# Patient Record
Sex: Female | Born: 1970 | Race: Black or African American | Hispanic: No | Smoking: Former smoker
Health system: Southern US, Community
[De-identification: ages and names within clinical notes are randomized; demographics above are authoritative.]

## PROBLEM LIST (undated history)

## (undated) DIAGNOSIS — I10 Essential (primary) hypertension: Secondary | ICD-10-CM

## (undated) DIAGNOSIS — F419 Anxiety disorder, unspecified: Secondary | ICD-10-CM

## (undated) DIAGNOSIS — F32A Depression, unspecified: Secondary | ICD-10-CM

## (undated) DIAGNOSIS — T7840XA Allergy, unspecified, initial encounter: Secondary | ICD-10-CM

## (undated) DIAGNOSIS — I639 Cerebral infarction, unspecified: Secondary | ICD-10-CM

## (undated) DIAGNOSIS — T8859XA Other complications of anesthesia, initial encounter: Secondary | ICD-10-CM

## (undated) DIAGNOSIS — L309 Dermatitis, unspecified: Secondary | ICD-10-CM

## (undated) DIAGNOSIS — J45909 Unspecified asthma, uncomplicated: Secondary | ICD-10-CM

## (undated) DIAGNOSIS — T4145XA Adverse effect of unspecified anesthetic, initial encounter: Secondary | ICD-10-CM

## (undated) DIAGNOSIS — M419 Scoliosis, unspecified: Secondary | ICD-10-CM

## (undated) DIAGNOSIS — G8929 Other chronic pain: Secondary | ICD-10-CM

## (undated) DIAGNOSIS — F329 Major depressive disorder, single episode, unspecified: Secondary | ICD-10-CM

## (undated) DIAGNOSIS — L0390 Cellulitis, unspecified: Secondary | ICD-10-CM

## (undated) DIAGNOSIS — D649 Anemia, unspecified: Secondary | ICD-10-CM

## (undated) DIAGNOSIS — M549 Dorsalgia, unspecified: Secondary | ICD-10-CM

## (undated) HISTORY — PX: BREAST SURGERY: SHX581

## (undated) HISTORY — DX: Allergy, unspecified, initial encounter: T78.40XA

## (undated) HISTORY — PX: TUBAL LIGATION: SHX77

## (undated) HISTORY — PX: ABDOMINAL HYSTERECTOMY: SHX81

---

## 1997-08-19 ENCOUNTER — Encounter: Admission: RE | Admit: 1997-08-19 | Discharge: 1997-08-19 | Payer: Self-pay | Admitting: Sports Medicine

## 1997-08-26 ENCOUNTER — Encounter: Admission: RE | Admit: 1997-08-26 | Discharge: 1997-08-26 | Payer: Self-pay | Admitting: Family Medicine

## 1997-09-03 ENCOUNTER — Encounter: Admission: RE | Admit: 1997-09-03 | Discharge: 1997-09-03 | Payer: Self-pay | Admitting: Family Medicine

## 1997-09-10 ENCOUNTER — Encounter: Admission: RE | Admit: 1997-09-10 | Discharge: 1997-12-09 | Payer: Self-pay | Admitting: *Deleted

## 1997-09-10 ENCOUNTER — Encounter: Admission: RE | Admit: 1997-09-10 | Discharge: 1997-09-10 | Payer: Self-pay | Admitting: Family Medicine

## 1997-11-07 ENCOUNTER — Emergency Department (HOSPITAL_COMMUNITY): Admission: EM | Admit: 1997-11-07 | Discharge: 1997-11-07 | Payer: Self-pay | Admitting: Emergency Medicine

## 1998-03-14 ENCOUNTER — Emergency Department (HOSPITAL_COMMUNITY): Admission: EM | Admit: 1998-03-14 | Discharge: 1998-03-14 | Payer: Self-pay | Admitting: Emergency Medicine

## 1998-03-21 ENCOUNTER — Emergency Department (HOSPITAL_COMMUNITY): Admission: EM | Admit: 1998-03-21 | Discharge: 1998-03-21 | Payer: Self-pay | Admitting: Emergency Medicine

## 1998-04-06 ENCOUNTER — Encounter: Payer: Self-pay | Admitting: Emergency Medicine

## 1998-04-06 ENCOUNTER — Ambulatory Visit (HOSPITAL_COMMUNITY): Admission: RE | Admit: 1998-04-06 | Discharge: 1998-04-06 | Payer: Self-pay | Admitting: Obstetrics and Gynecology

## 1998-04-06 ENCOUNTER — Emergency Department (HOSPITAL_COMMUNITY): Admission: EM | Admit: 1998-04-06 | Discharge: 1998-04-06 | Payer: Self-pay | Admitting: Emergency Medicine

## 1998-04-09 ENCOUNTER — Ambulatory Visit (HOSPITAL_COMMUNITY): Admission: RE | Admit: 1998-04-09 | Discharge: 1998-04-09 | Payer: Self-pay | Admitting: Obstetrics

## 1998-04-12 ENCOUNTER — Inpatient Hospital Stay (HOSPITAL_COMMUNITY): Admission: AD | Admit: 1998-04-12 | Discharge: 1998-04-12 | Payer: Self-pay | Admitting: Obstetrics and Gynecology

## 1998-04-16 ENCOUNTER — Ambulatory Visit (HOSPITAL_COMMUNITY): Admission: RE | Admit: 1998-04-16 | Discharge: 1998-04-16 | Payer: Self-pay | Admitting: *Deleted

## 1998-04-29 ENCOUNTER — Ambulatory Visit (HOSPITAL_COMMUNITY): Admission: RE | Admit: 1998-04-29 | Discharge: 1998-04-29 | Payer: Self-pay | Admitting: *Deleted

## 1998-05-14 ENCOUNTER — Emergency Department (HOSPITAL_COMMUNITY): Admission: EM | Admit: 1998-05-14 | Discharge: 1998-05-14 | Payer: Self-pay | Admitting: Emergency Medicine

## 1998-05-27 ENCOUNTER — Emergency Department (HOSPITAL_COMMUNITY): Admission: EM | Admit: 1998-05-27 | Discharge: 1998-05-27 | Payer: Self-pay | Admitting: Emergency Medicine

## 1998-08-02 ENCOUNTER — Other Ambulatory Visit: Admission: RE | Admit: 1998-08-02 | Discharge: 1998-08-02 | Payer: Self-pay | Admitting: Obstetrics and Gynecology

## 1998-12-04 ENCOUNTER — Inpatient Hospital Stay (HOSPITAL_COMMUNITY): Admission: AD | Admit: 1998-12-04 | Discharge: 1998-12-04 | Payer: Self-pay | Admitting: Obstetrics and Gynecology

## 1999-01-04 ENCOUNTER — Inpatient Hospital Stay (HOSPITAL_COMMUNITY): Admission: AD | Admit: 1999-01-04 | Discharge: 1999-01-04 | Payer: Self-pay | Admitting: Obstetrics and Gynecology

## 1999-01-09 ENCOUNTER — Inpatient Hospital Stay (HOSPITAL_COMMUNITY): Admission: AD | Admit: 1999-01-09 | Discharge: 1999-01-09 | Payer: Self-pay | Admitting: Obstetrics and Gynecology

## 1999-01-19 ENCOUNTER — Inpatient Hospital Stay (HOSPITAL_COMMUNITY): Admission: AD | Admit: 1999-01-19 | Discharge: 1999-01-19 | Payer: Self-pay | Admitting: Obstetrics & Gynecology

## 1999-02-20 ENCOUNTER — Inpatient Hospital Stay (HOSPITAL_COMMUNITY): Admission: AD | Admit: 1999-02-20 | Discharge: 1999-02-20 | Payer: Self-pay | Admitting: *Deleted

## 1999-02-28 ENCOUNTER — Encounter (INDEPENDENT_AMBULATORY_CARE_PROVIDER_SITE_OTHER): Payer: Self-pay

## 1999-02-28 ENCOUNTER — Inpatient Hospital Stay (HOSPITAL_COMMUNITY): Admission: AD | Admit: 1999-02-28 | Discharge: 1999-03-02 | Payer: Self-pay | Admitting: Obstetrics and Gynecology

## 2000-03-23 ENCOUNTER — Emergency Department (HOSPITAL_COMMUNITY): Admission: EM | Admit: 2000-03-23 | Discharge: 2000-03-23 | Payer: Self-pay | Admitting: Emergency Medicine

## 2000-11-08 ENCOUNTER — Encounter: Payer: Self-pay | Admitting: Emergency Medicine

## 2000-11-08 ENCOUNTER — Emergency Department (HOSPITAL_COMMUNITY): Admission: EM | Admit: 2000-11-08 | Discharge: 2000-11-08 | Payer: Self-pay | Admitting: Emergency Medicine

## 2001-02-12 ENCOUNTER — Emergency Department (HOSPITAL_COMMUNITY): Admission: EM | Admit: 2001-02-12 | Discharge: 2001-02-12 | Payer: Self-pay | Admitting: Emergency Medicine

## 2001-06-09 ENCOUNTER — Encounter: Admission: RE | Admit: 2001-06-09 | Discharge: 2001-06-09 | Payer: Self-pay | Admitting: Family Medicine

## 2001-06-20 ENCOUNTER — Encounter: Admission: RE | Admit: 2001-06-20 | Discharge: 2001-06-20 | Payer: Self-pay | Admitting: Family Medicine

## 2001-07-31 ENCOUNTER — Encounter: Admission: RE | Admit: 2001-07-31 | Discharge: 2001-07-31 | Payer: Self-pay | Admitting: Sports Medicine

## 2001-12-02 ENCOUNTER — Encounter: Admission: RE | Admit: 2001-12-02 | Discharge: 2001-12-02 | Payer: Self-pay | Admitting: Family Medicine

## 2002-11-15 ENCOUNTER — Encounter (INDEPENDENT_AMBULATORY_CARE_PROVIDER_SITE_OTHER): Payer: Self-pay | Admitting: *Deleted

## 2002-11-15 LAB — CONVERTED CEMR LAB

## 2002-11-19 ENCOUNTER — Other Ambulatory Visit: Admission: RE | Admit: 2002-11-19 | Discharge: 2002-11-19 | Payer: Self-pay | Admitting: Family Medicine

## 2002-11-20 ENCOUNTER — Encounter: Payer: Self-pay | Admitting: Sports Medicine

## 2002-11-20 ENCOUNTER — Encounter (INDEPENDENT_AMBULATORY_CARE_PROVIDER_SITE_OTHER): Payer: Self-pay | Admitting: *Deleted

## 2002-11-20 ENCOUNTER — Encounter: Admission: RE | Admit: 2002-11-20 | Discharge: 2002-11-20 | Payer: Self-pay | Admitting: Sports Medicine

## 2003-02-23 ENCOUNTER — Emergency Department (HOSPITAL_COMMUNITY): Admission: EM | Admit: 2003-02-23 | Discharge: 2003-02-23 | Payer: Self-pay | Admitting: *Deleted

## 2003-06-26 ENCOUNTER — Emergency Department (HOSPITAL_COMMUNITY): Admission: EM | Admit: 2003-06-26 | Discharge: 2003-06-26 | Payer: Self-pay | Admitting: Emergency Medicine

## 2003-06-26 ENCOUNTER — Emergency Department (HOSPITAL_COMMUNITY): Admission: AD | Admit: 2003-06-26 | Discharge: 2003-06-26 | Payer: Self-pay | Admitting: Emergency Medicine

## 2003-07-06 ENCOUNTER — Emergency Department (HOSPITAL_COMMUNITY): Admission: EM | Admit: 2003-07-06 | Discharge: 2003-07-06 | Payer: Self-pay | Admitting: Emergency Medicine

## 2003-08-13 ENCOUNTER — Emergency Department (HOSPITAL_COMMUNITY): Admission: EM | Admit: 2003-08-13 | Discharge: 2003-08-13 | Payer: Self-pay | Admitting: Emergency Medicine

## 2004-03-03 ENCOUNTER — Emergency Department (HOSPITAL_COMMUNITY): Admission: EM | Admit: 2004-03-03 | Discharge: 2004-03-03 | Payer: Self-pay | Admitting: Emergency Medicine

## 2004-10-18 ENCOUNTER — Emergency Department (HOSPITAL_COMMUNITY): Admission: EM | Admit: 2004-10-18 | Discharge: 2004-10-18 | Payer: Self-pay | Admitting: Emergency Medicine

## 2004-12-19 ENCOUNTER — Emergency Department (HOSPITAL_COMMUNITY): Admission: EM | Admit: 2004-12-19 | Discharge: 2004-12-19 | Payer: Self-pay | Admitting: Emergency Medicine

## 2005-02-12 ENCOUNTER — Emergency Department (HOSPITAL_COMMUNITY): Admission: EM | Admit: 2005-02-12 | Discharge: 2005-02-12 | Payer: Self-pay | Admitting: Emergency Medicine

## 2005-04-06 ENCOUNTER — Emergency Department (HOSPITAL_COMMUNITY): Admission: EM | Admit: 2005-04-06 | Discharge: 2005-04-07 | Payer: Self-pay | Admitting: Emergency Medicine

## 2005-08-13 ENCOUNTER — Emergency Department (HOSPITAL_COMMUNITY): Admission: EM | Admit: 2005-08-13 | Discharge: 2005-08-13 | Payer: Self-pay | Admitting: Emergency Medicine

## 2006-02-12 ENCOUNTER — Ambulatory Visit: Payer: Self-pay | Admitting: Family Medicine

## 2006-04-21 ENCOUNTER — Emergency Department (HOSPITAL_COMMUNITY): Admission: EM | Admit: 2006-04-21 | Discharge: 2006-04-21 | Payer: Self-pay | Admitting: Family Medicine

## 2006-05-02 ENCOUNTER — Encounter: Admission: RE | Admit: 2006-05-02 | Discharge: 2006-05-02 | Payer: Self-pay | Admitting: Sports Medicine

## 2006-05-02 ENCOUNTER — Ambulatory Visit: Payer: Self-pay | Admitting: Sports Medicine

## 2006-05-24 ENCOUNTER — Ambulatory Visit: Payer: Self-pay | Admitting: Family Medicine

## 2006-07-12 ENCOUNTER — Encounter (INDEPENDENT_AMBULATORY_CARE_PROVIDER_SITE_OTHER): Payer: Self-pay | Admitting: *Deleted

## 2007-01-23 ENCOUNTER — Ambulatory Visit: Payer: Self-pay | Admitting: Family Medicine

## 2007-01-23 ENCOUNTER — Ambulatory Visit (HOSPITAL_COMMUNITY): Admission: RE | Admit: 2007-01-23 | Discharge: 2007-01-23 | Payer: Self-pay | Admitting: Family Medicine

## 2007-01-23 ENCOUNTER — Encounter (INDEPENDENT_AMBULATORY_CARE_PROVIDER_SITE_OTHER): Payer: Self-pay | Admitting: *Deleted

## 2007-01-23 DIAGNOSIS — F329 Major depressive disorder, single episode, unspecified: Secondary | ICD-10-CM

## 2007-01-23 DIAGNOSIS — F419 Anxiety disorder, unspecified: Secondary | ICD-10-CM

## 2007-01-23 LAB — CONVERTED CEMR LAB
Albumin: 4.7 g/dL (ref 3.5–5.2)
BUN: 11 mg/dL (ref 6–23)
Bilirubin Urine: NEGATIVE
Calcium: 9.6 mg/dL (ref 8.4–10.5)
Chloride: 106 meq/L (ref 96–112)
Eosinophils Absolute: 0 10*3/uL (ref 0.0–0.7)
Glucose, Bld: 87 mg/dL (ref 70–99)
Glucose, Urine, Semiquant: NEGATIVE
Hemoglobin: 11.6 g/dL
Hemoglobin: 12.5 g/dL (ref 12.0–15.0)
Ketones, urine, test strip: NEGATIVE
Lipase: 13 units/L (ref 0–75)
Lymphs Abs: 2 10*3/uL (ref 0.7–3.3)
MCV: 81.1 fL (ref 78.0–100.0)
Monocytes Absolute: 0.3 10*3/uL (ref 0.2–0.7)
Monocytes Relative: 6 % (ref 3–11)
Neutro Abs: 2.9 10*3/uL (ref 1.7–7.7)
Neutrophils Relative %: 55 % (ref 43–77)
Potassium: 3.9 meq/L (ref 3.5–5.3)
Protein, U semiquant: NEGATIVE
RBC: 4.61 M/uL (ref 3.87–5.11)
Specific Gravity, Urine: 1.025
WBC: 5.2 10*3/uL (ref 4.0–10.5)
pH: 6.5

## 2007-01-24 ENCOUNTER — Telehealth (INDEPENDENT_AMBULATORY_CARE_PROVIDER_SITE_OTHER): Payer: Self-pay | Admitting: *Deleted

## 2007-01-24 ENCOUNTER — Encounter (INDEPENDENT_AMBULATORY_CARE_PROVIDER_SITE_OTHER): Payer: Self-pay | Admitting: *Deleted

## 2007-01-27 ENCOUNTER — Encounter: Payer: Self-pay | Admitting: Family Medicine

## 2007-02-20 ENCOUNTER — Ambulatory Visit: Payer: Self-pay | Admitting: Family Medicine

## 2007-02-20 DIAGNOSIS — G43909 Migraine, unspecified, not intractable, without status migrainosus: Secondary | ICD-10-CM | POA: Insufficient documentation

## 2007-03-19 ENCOUNTER — Telehealth: Payer: Self-pay | Admitting: *Deleted

## 2007-03-20 ENCOUNTER — Ambulatory Visit: Payer: Self-pay | Admitting: Family Medicine

## 2007-03-20 ENCOUNTER — Telehealth: Payer: Self-pay | Admitting: *Deleted

## 2007-03-20 ENCOUNTER — Encounter: Payer: Self-pay | Admitting: Family Medicine

## 2007-03-20 DIAGNOSIS — N920 Excessive and frequent menstruation with regular cycle: Secondary | ICD-10-CM | POA: Insufficient documentation

## 2007-03-20 LAB — CONVERTED CEMR LAB
Platelets: 311 10*3/uL (ref 150–400)
TSH: 1.597 microintl units/mL (ref 0.350–5.50)
WBC: 4.6 10*3/uL (ref 4.0–10.5)

## 2007-03-21 ENCOUNTER — Ambulatory Visit (HOSPITAL_COMMUNITY): Admission: RE | Admit: 2007-03-21 | Discharge: 2007-03-21 | Payer: Self-pay | Admitting: *Deleted

## 2007-03-24 ENCOUNTER — Telehealth: Payer: Self-pay | Admitting: *Deleted

## 2007-06-11 ENCOUNTER — Telehealth: Payer: Self-pay | Admitting: *Deleted

## 2007-06-11 ENCOUNTER — Ambulatory Visit: Payer: Self-pay | Admitting: Family Medicine

## 2007-06-11 LAB — CONVERTED CEMR LAB: Beta hcg, urine, semiquantitative: NEGATIVE

## 2007-08-07 ENCOUNTER — Emergency Department (HOSPITAL_COMMUNITY): Admission: EM | Admit: 2007-08-07 | Discharge: 2007-08-07 | Payer: Self-pay | Admitting: Family Medicine

## 2008-01-03 ENCOUNTER — Emergency Department (HOSPITAL_COMMUNITY): Admission: EM | Admit: 2008-01-03 | Discharge: 2008-01-04 | Payer: Self-pay | Admitting: Emergency Medicine

## 2008-02-11 ENCOUNTER — Encounter: Payer: Self-pay | Admitting: Family Medicine

## 2008-02-11 ENCOUNTER — Ambulatory Visit: Payer: Self-pay | Admitting: Family Medicine

## 2008-02-11 ENCOUNTER — Telehealth: Payer: Self-pay | Admitting: *Deleted

## 2008-02-11 LAB — CONVERTED CEMR LAB: Rapid Strep: NEGATIVE

## 2008-04-13 ENCOUNTER — Encounter: Payer: Self-pay | Admitting: *Deleted

## 2008-04-14 ENCOUNTER — Telehealth: Payer: Self-pay | Admitting: Family Medicine

## 2008-04-14 ENCOUNTER — Ambulatory Visit: Payer: Self-pay | Admitting: Family Medicine

## 2008-04-14 LAB — CONVERTED CEMR LAB: Hemoglobin: 11.3 g/dL

## 2008-04-16 ENCOUNTER — Encounter (INDEPENDENT_AMBULATORY_CARE_PROVIDER_SITE_OTHER): Payer: Self-pay | Admitting: *Deleted

## 2008-06-14 ENCOUNTER — Telehealth: Payer: Self-pay | Admitting: *Deleted

## 2008-06-22 ENCOUNTER — Ambulatory Visit: Payer: Self-pay | Admitting: Family Medicine

## 2008-06-22 ENCOUNTER — Telehealth: Payer: Self-pay | Admitting: *Deleted

## 2008-06-22 ENCOUNTER — Encounter: Payer: Self-pay | Admitting: Family Medicine

## 2008-06-22 LAB — CONVERTED CEMR LAB
Blood in Urine, dipstick: NEGATIVE
Protein, U semiquant: 30
Specific Gravity, Urine: 1.015
Urobilinogen, UA: 1
pH: 7

## 2008-06-25 ENCOUNTER — Telehealth: Payer: Self-pay | Admitting: Family Medicine

## 2008-08-19 ENCOUNTER — Telehealth: Payer: Self-pay | Admitting: Family Medicine

## 2008-08-20 ENCOUNTER — Ambulatory Visit: Payer: Self-pay | Admitting: Family Medicine

## 2008-08-20 DIAGNOSIS — J309 Allergic rhinitis, unspecified: Secondary | ICD-10-CM | POA: Insufficient documentation

## 2008-09-24 ENCOUNTER — Inpatient Hospital Stay (HOSPITAL_COMMUNITY): Admission: AD | Admit: 2008-09-24 | Discharge: 2008-09-24 | Payer: Self-pay | Admitting: Obstetrics & Gynecology

## 2008-09-27 ENCOUNTER — Ambulatory Visit: Payer: Self-pay | Admitting: Family Medicine

## 2008-09-27 ENCOUNTER — Telehealth: Payer: Self-pay | Admitting: Family Medicine

## 2008-10-31 ENCOUNTER — Emergency Department (HOSPITAL_COMMUNITY): Admission: EM | Admit: 2008-10-31 | Discharge: 2008-10-31 | Payer: Self-pay | Admitting: Emergency Medicine

## 2008-11-02 ENCOUNTER — Emergency Department (HOSPITAL_COMMUNITY): Admission: EM | Admit: 2008-11-02 | Discharge: 2008-11-02 | Payer: Self-pay | Admitting: Emergency Medicine

## 2008-11-04 ENCOUNTER — Telehealth: Payer: Self-pay | Admitting: Family Medicine

## 2008-11-10 ENCOUNTER — Telehealth: Payer: Self-pay | Admitting: Family Medicine

## 2008-11-15 ENCOUNTER — Emergency Department (HOSPITAL_COMMUNITY): Admission: EM | Admit: 2008-11-15 | Discharge: 2008-11-15 | Payer: Self-pay | Admitting: Emergency Medicine

## 2008-12-10 ENCOUNTER — Ambulatory Visit: Payer: Self-pay | Admitting: Family Medicine

## 2008-12-10 ENCOUNTER — Telehealth: Payer: Self-pay | Admitting: Family Medicine

## 2008-12-10 ENCOUNTER — Encounter: Payer: Self-pay | Admitting: Family Medicine

## 2008-12-13 ENCOUNTER — Ambulatory Visit: Payer: Self-pay | Admitting: Family Medicine

## 2008-12-15 ENCOUNTER — Ambulatory Visit: Payer: Self-pay | Admitting: Family Medicine

## 2008-12-31 ENCOUNTER — Telehealth: Payer: Self-pay | Admitting: Family Medicine

## 2009-01-21 ENCOUNTER — Telehealth: Payer: Self-pay | Admitting: Family Medicine

## 2009-03-07 ENCOUNTER — Ambulatory Visit: Payer: Self-pay | Admitting: Family Medicine

## 2009-03-07 ENCOUNTER — Encounter: Payer: Self-pay | Admitting: Family Medicine

## 2009-03-07 DIAGNOSIS — K5289 Other specified noninfective gastroenteritis and colitis: Secondary | ICD-10-CM

## 2009-03-09 LAB — CONVERTED CEMR LAB
BUN: 11 mg/dL (ref 6–23)
Chloride: 107 meq/L (ref 96–112)
Eosinophils Relative: 1 % (ref 0–5)
HCT: 36.4 % (ref 36.0–46.0)
Lymphocytes Relative: 44 % (ref 12–46)
Lymphs Abs: 2.4 10*3/uL (ref 0.7–4.0)
Platelets: 277 10*3/uL (ref 150–400)
Potassium: 4.5 meq/L (ref 3.5–5.3)
WBC: 5.4 10*3/uL (ref 4.0–10.5)

## 2009-03-10 ENCOUNTER — Encounter: Payer: Self-pay | Admitting: Family Medicine

## 2009-03-10 ENCOUNTER — Telehealth: Payer: Self-pay | Admitting: Family Medicine

## 2009-04-05 ENCOUNTER — Encounter (INDEPENDENT_AMBULATORY_CARE_PROVIDER_SITE_OTHER): Payer: Self-pay | Admitting: *Deleted

## 2009-04-05 DIAGNOSIS — F17200 Nicotine dependence, unspecified, uncomplicated: Secondary | ICD-10-CM

## 2009-05-03 ENCOUNTER — Telehealth: Payer: Self-pay | Admitting: Family Medicine

## 2009-05-23 ENCOUNTER — Telehealth: Payer: Self-pay | Admitting: Family Medicine

## 2009-05-23 ENCOUNTER — Ambulatory Visit: Payer: Self-pay | Admitting: Family Medicine

## 2009-05-31 ENCOUNTER — Ambulatory Visit: Payer: Self-pay | Admitting: Family Medicine

## 2009-06-01 ENCOUNTER — Telehealth: Payer: Self-pay | Admitting: Family Medicine

## 2009-06-02 ENCOUNTER — Encounter (INDEPENDENT_AMBULATORY_CARE_PROVIDER_SITE_OTHER): Payer: Self-pay | Admitting: *Deleted

## 2009-07-01 ENCOUNTER — Telehealth: Payer: Self-pay | Admitting: Family Medicine

## 2009-07-01 ENCOUNTER — Encounter: Payer: Self-pay | Admitting: Family Medicine

## 2009-07-01 ENCOUNTER — Ambulatory Visit: Payer: Self-pay | Admitting: Family Medicine

## 2009-07-31 ENCOUNTER — Emergency Department (HOSPITAL_COMMUNITY): Admission: EM | Admit: 2009-07-31 | Discharge: 2009-07-31 | Payer: Self-pay | Admitting: Family Medicine

## 2009-08-08 ENCOUNTER — Emergency Department (HOSPITAL_COMMUNITY): Admission: EM | Admit: 2009-08-08 | Discharge: 2009-08-08 | Payer: Self-pay | Admitting: Emergency Medicine

## 2009-08-09 ENCOUNTER — Telehealth: Payer: Self-pay | Admitting: Family Medicine

## 2009-08-09 ENCOUNTER — Encounter: Payer: Self-pay | Admitting: Family Medicine

## 2009-08-09 ENCOUNTER — Ambulatory Visit: Payer: Self-pay | Admitting: Family Medicine

## 2009-08-09 DIAGNOSIS — S139XXA Sprain of joints and ligaments of unspecified parts of neck, initial encounter: Secondary | ICD-10-CM

## 2009-08-12 ENCOUNTER — Telehealth (INDEPENDENT_AMBULATORY_CARE_PROVIDER_SITE_OTHER): Payer: Self-pay | Admitting: *Deleted

## 2009-08-15 ENCOUNTER — Encounter: Payer: Self-pay | Admitting: Family Medicine

## 2009-08-15 ENCOUNTER — Ambulatory Visit: Payer: Self-pay | Admitting: Family Medicine

## 2009-08-22 ENCOUNTER — Telehealth: Payer: Self-pay | Admitting: Family Medicine

## 2009-08-23 ENCOUNTER — Encounter: Payer: Self-pay | Admitting: *Deleted

## 2009-08-24 ENCOUNTER — Encounter: Payer: Self-pay | Admitting: *Deleted

## 2009-08-24 ENCOUNTER — Telehealth: Payer: Self-pay | Admitting: *Deleted

## 2009-09-27 ENCOUNTER — Telehealth: Payer: Self-pay | Admitting: Family Medicine

## 2009-10-01 ENCOUNTER — Telehealth: Payer: Self-pay | Admitting: Family Medicine

## 2009-10-03 ENCOUNTER — Ambulatory Visit: Payer: Self-pay | Admitting: Family Medicine

## 2009-10-05 ENCOUNTER — Encounter: Payer: Self-pay | Admitting: Family Medicine

## 2009-11-24 ENCOUNTER — Ambulatory Visit: Payer: Self-pay | Admitting: Family Medicine

## 2009-11-24 DIAGNOSIS — R519 Headache, unspecified: Secondary | ICD-10-CM | POA: Insufficient documentation

## 2009-11-24 DIAGNOSIS — R51 Headache: Secondary | ICD-10-CM

## 2010-01-11 ENCOUNTER — Emergency Department (HOSPITAL_COMMUNITY): Admission: EM | Admit: 2010-01-11 | Discharge: 2010-01-11 | Payer: Self-pay | Admitting: Emergency Medicine

## 2010-02-13 ENCOUNTER — Telehealth: Payer: Self-pay | Admitting: Family Medicine

## 2010-02-13 ENCOUNTER — Emergency Department (HOSPITAL_COMMUNITY): Admission: EM | Admit: 2010-02-13 | Discharge: 2010-02-13 | Payer: Self-pay | Admitting: Emergency Medicine

## 2010-04-03 ENCOUNTER — Emergency Department (HOSPITAL_COMMUNITY): Admission: EM | Admit: 2010-04-03 | Discharge: 2010-04-03 | Payer: Self-pay | Admitting: Emergency Medicine

## 2010-04-04 ENCOUNTER — Ambulatory Visit: Payer: Self-pay | Admitting: Family Medicine

## 2010-04-04 DIAGNOSIS — R300 Dysuria: Secondary | ICD-10-CM

## 2010-04-04 LAB — CONVERTED CEMR LAB
Ketones, urine, test strip: NEGATIVE
Specific Gravity, Urine: 1.02
Urobilinogen, UA: 0.2
pH: 7

## 2010-04-13 ENCOUNTER — Ambulatory Visit: Payer: Self-pay | Admitting: Family Medicine

## 2010-04-13 ENCOUNTER — Encounter
Admission: RE | Admit: 2010-04-13 | Discharge: 2010-04-13 | Payer: Self-pay | Source: Home / Self Care | Admitting: Family Medicine

## 2010-04-19 ENCOUNTER — Ambulatory Visit: Payer: Self-pay | Admitting: Sports Medicine

## 2010-04-26 ENCOUNTER — Ambulatory Visit: Payer: Self-pay

## 2010-06-02 ENCOUNTER — Emergency Department (HOSPITAL_COMMUNITY)
Admission: EM | Admit: 2010-06-02 | Discharge: 2010-06-02 | Payer: Self-pay | Source: Home / Self Care | Admitting: Emergency Medicine
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
## 2010-06-15 NOTE — Letter (Signed)
Summary: Work Excuse  [facility]  [address]   [place], [place] [address]   Phone: [phone]  Fax: [phone]    Today's Date: August 24, 2009  Name of Patient: Susan Sanders  The above named patient had a medical visit today at:  am / pm.  Please take this into consideration when reviewing the time away from work/school.    Special Instructions:  [  ] None  [  ] To be off the remainder of today, returning to the normal work / school schedule tomorrow.  [  ] To be off until the next scheduled appointment on ______________________.  [ x ] Other  [name] may return to full duties without any restrictions               Sincerely yours,   [name] CMA

## 2010-06-15 NOTE — Progress Notes (Signed)
Summary: Medication List   Phone Note Call from Patient Call back at [phone]   Caller: Patient Summary of Call: Pt would like to get a list of medications that she is taking.  Pt would like to pick this up. Initial call taken by: Clydell Hakim,  Sep 27, 2009 9:44 AM  Follow-up for Phone Call        To PCP to verify med current list [facility] giving copy to pt Follow-up by: Gladstone Pih,  Oct 03, 2009 8:47 AM  Additional Follow-up for Phone Call Additional follow up Details #1::        see notes from on call md. she will see Dr. Swaziland at 11:30 today. can get copy of med list at that time Additional Follow-up by: Golden Circle RN,  Oct 03, 2009 8:49 AM

## 2010-06-15 NOTE — Progress Notes (Signed)
Summary: Letter Needed   Phone Note Call from Patient Call back at [phone]   Caller: Patient Summary of Call: Needs note stating that she is not under any restrictions and can return to work.   Initial call taken by: Clydell Hakim,  August 24, 2009 3:24 PM    letter done up front for pt pick up, pt notified.[name] CMA  August 24, 2009 5:24 PM

## 2010-06-15 NOTE — Letter (Signed)
Summary: Out of Work  [facility]  [address]   [place], [place] [address]   Phone: [phone]  Fax: [phone]    August 09, 2009   Employee:  ALIANNA WURSTER    To Whom It May Concern:   For Medical reasons, please excuse the above named employee from work for the following dates:  Start:   August 09, 2009  End:August 13, 2009    If you need additional information, please feel free to contact our office.         Sincerely,    Denny Levy MD

## 2010-06-15 NOTE — Letter (Signed)
Summary: Out of Work  [facility]  [address]   [place], [place] [address]   Phone: [phone]  Fax: [phone]    July 01, 2009   Employee:  SHERMA VANMETRE    To Whom It May Concern:   For Medical reasons, please excuse the above named employee from work for the following dates:  Start:   06/30/09  End:   May return to work Monday 07/04/09  If you need additional information, please feel free to contact our office.         Sincerely,    Doralee Albino MD

## 2010-06-15 NOTE — Progress Notes (Signed)
   Phone Note Call from Patient   Caller: Patient Summary of Call: knot under right breast. hard. "shot out" dark fluid. no fever, chills, TTP, erythema. likely boil/cyst vs. abscess. recommend eval to see if I&D needed. patient would like to wait until monday. red flags (described above) given.  Initial call taken by: Lequita Asal  MD,  Oct 01, 2009 12:20 PM

## 2010-06-15 NOTE — Letter (Signed)
Summary: Out of Work  [facility]  [address]   [place], [place] [address]   Phone: [phone]  Fax: [phone]    August 23, 2009   Employee:  Susan Sanders    To Whom It May Concern:   For Medical reasons, please excuse the above named employee from work for the following dates:  Start:   August 15, 2009  End:   August 23, 2009  If you need additional information, please feel free to contact our office.         Sincerely,    [name] CMA

## 2010-06-15 NOTE — Assessment & Plan Note (Signed)
Summary: flu  s/s. needs note for work & school   Vital Signs:  Patient Profile:   40 Years Old Female Height:     64 inches Weight:      140.3 pounds Temp:     98.9 degrees F Pulse rate:   79 / minute BP sitting:   115 / 79  (left arm)  Pt. in pain?   yes    Location:   generalized    Intensity:   7    Type:       aching  Vitals Entered By: Theresia Lo RN (February 11, 2008 2:04 PM)              Is Patient Diabetic? No     PCP:  Marisue Ivan  MD  Chief Complaint:  dizziness, chills, aching, and diarrhea.  History of Present Illness: Thresea is a 46 year African American female c/o subjective fever/chills, body aches, headache, nausea, vomiting, diarrhea, and sore throat x 1 day. She took Phenergan last night to help decrease her N/V. It did help, but she is still nauseated and last vomited one hour ago. It was NBNB. She took Imodium for diarrhea and says that it did help. Her stools have been loose to watery, no melena, no hematochezia, no mucus. She works at [employer]. She received the flu vax 1-2 weeks ago. Denies sick contacts. Denies rashes, changes in vision, cough, or stiff neck.     Updated Prior Medication List: PRILOSEC OTC 20 MG TBEC (OMEPRAZOLE MAGNESIUM) 2 by mouth once daily XANAX 0.5 MG TABS (ALPRAZOLAM) 1-2 as needed anxiety MOTRIN IB 200 MG  TABS (IBUPROFEN) 800mg  (4 tablets) every 8 hours as needed for pain for the next 3 days SPRINTEC 28 0.25-35 MG-MCG  TABS (NORGESTIMATE-ETH ESTRADIOL) One tablet daily as directed VICODIN 5-500 MG  TABS (HYDROCODONE-ACETAMINOPHEN) One tablet by mouth evey 4-6 hours as needed for pain ONDANSETRON 4 MG TBDP (ONDANSETRON) one by mouth every 8 hours as needed for nausea  Current Allergies: No known allergies   Past Medical History:    Reviewed history from 07/11/2006 and no changes required:       7/04 Trichomoniasis and BV  Past Surgical History:    Reviewed history from 01/23/2007 and no changes  required:       Pelvic US scheduled - 11/27/2002- Ovarian cyst, small fibroid       Tubal ligation   Family History:    Reviewed history from 01/23/2007 and no changes required:       no fam hx of CAD  Social History:    Reviewed history from 01/23/2007 and no changes required:       works as a Lawyer at [employer]; 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    Review of Systems  The patient denies chest pain, syncope, dyspnea on exertion, peripheral edema, prolonged cough, melena, hematochezia, severe indigestion/heartburn, depression, and unusual weight change.     Physical Exam  General:     alert, well-developed, well-nourished, and well-hydrated.   Eyes:     pupils equal, pupils round, and pupils reactive to light.   Ears:     R ear normal and L ear normal.   Nose:     no external deformity.   Mouth:     pharynx pink and moist, no exudates, no erythema Lungs:     Normal respiratory effort, chest expands symmetrically. Lungs are clear to auscultation, no crackles or wheezes.  Heart:     Normal rate and regular rhythm. S1 and S2 normal without gallop, murmur, click, rub or other extra sounds. Abdomen:     Bowel sounds positive,abdomen soft and non-tender without masses, organomegaly or hernias noted. Psych:     Oriented X3, depressed affect, and tearful.      Impression & Recommendations:  Problem # 1:  VIRAL INFECTION (ICD-079.99) Assessment: Deteriorated The patient was advised that her symptoms were likely due to a viral infection that would pass. She was told to stay well hydrated, to increase to solid foods slowly, to use Tylenol or Ibuprofen for pain, Imodium for diarrhea, and Zofran for nausea. She was also given a note for school and work and advised to stay home until her symptoms resolve.    Zofran 4 mg: one by mouth every eight hours as needed for nausea  Orders: [facility]- Est Level  2 ([address]) Discussed symptomatic relief.    Her updated  medication list for this problem includes:    Motrin Ib 200 Mg Tabs (Ibuprofen) ..... 800mg  (4 tablets) every 8 hours as needed for pain for the next 3 days   Complete Medication List: 1)  Prilosec Otc 20 Mg Tbec (Omeprazole magnesium) .... 2 by mouth once daily 2)  Xanax 0.5 Mg Tabs (Alprazolam) .Marland Kitchen.. 1-2 as needed anxiety 3)  Motrin Ib 200 Mg Tabs (Ibuprofen) .... 800mg  (4 tablets) every 8 hours as needed for pain for the next 3 days 4)  Sprintec 28 0.25-35 Mg-mcg Tabs (Norgestimate-eth estradiol) .... One tablet daily as directed 5)  Vicodin 5-500 Mg Tabs (Hydrocodone-acetaminophen) .... One tablet by mouth evey 4-6 hours as needed for pain 6)  Ondansetron 4 Mg Tbdp (Ondansetron) .... One by mouth every 8 hours as needed for nausea  Other Orders: Rapid Strep-[facility] (98119)   Patient Instructions: 1)  Get plenty of rest, drink lots of clear liquids, and use Tylenol or Ibuprofen for fever and comfort. Return in 7-10 days if you're not better:sooner if you're feeling worse. 2)  Take 650-1000mg  of Tylenol every 4-6 hours as needed for relief of pain or comfort of fever AVOID taking more than 4000mg   in a 24 hour period (can cause liver damage in higher doses). 3)  Take 400-600mg  of Ibuprofen (Advil, Motrin) with food every 4-6 hours as needed for relief of pain or comfort of fever. 4)  Oral Rehydration Solution: drink 1/2 ounce every 15 minutes. If tolerated afert 1 hour, drink 1 ounce every 15 minutes. As you can tolerate, keep adding 1/2 ounce every 15 minutes, up to a total of 2-4 ounces. Contact the office if unable to tolerate oral solution, if you keep vomiting, or you continue to have signs of dehydration. 5)  Recommended remaining out of work for 2 days 6)  Recommended remaining out of school for 2 days   Prescriptions: ONDANSETRON 4 MG TBDP (ONDANSETRON) one by mouth every 8 hours as needed for nausea  #15 x 0   Entered and Authorized by:   Helane Rima MD   Signed by:   Helane Rima MD on 02/11/2008   Method used:   Historical   RxID:   [id number] ONDANSETRON 4 MG TBDP (ONDANSETRON) on epo every 8 hours as needed for nausea  #15 x 0   Entered and Authorized by:   Helane Rima MD   Signed by:   Helane Rima MD on 02/11/2008   Method used:   Print then Give to Patient   RxID:  [id number]  ] Laboratory Results  Date/Time Received: February 11, 2008 2:08 PM  Date/Time Reported: February 11, 2008 2:23 PM   Other Tests  Rapid Strep: negative Comments: ...............test performed by......Marland KitchenBonnie A. [place], [place] (ASCP)
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
## 2010-06-15 NOTE — Progress Notes (Signed)
Summary: triage   Phone Note Call from Patient Call back at Home Phone [phone]   Caller: Patient Summary of Call: having pain from stomach to knees Initial call taken by: De Nurse,  May 23, 2009 9:47 AM  Follow-up for Phone Call        states she has low back pain. never f/u with appt from last call. states the pain went away. has to go to work & does not want to see her pcp later today. she is on her way now Follow-up by: Golden Circle RN,  May 23, 2009 9:53 AM  Additional Follow-up for Phone Call Additional follow up Details #1::        Reviewed Dr. Philis Pique clinic note.  Will f/u as needed .  Additional Follow-up by: Marisue Ivan  MD,  May 23, 2009 4:52 PM

## 2010-06-15 NOTE — Assessment & Plan Note (Signed)
Summary: n & v,diarrhea/[place]ong   Vital Signs:  Patient profile:   40 year old female Weight:      152.7 pounds Temp:     98.2 degrees F oral Pulse rate:   85 / minute Pulse rhythm:   regular BP sitting:   117 / 78  (right arm)  Vitals Entered By: [name] CMA (July 01, 2009 2:57 PM) Comments nausea&vomitting x 2 days last episode of vomitting about 45 mins to 1 hour ago. headache started after vomitting   Primary Care Provider:  Marisue Ivan  MD   History of Present Illness: Ill for two days with nausea, vomiting and diarrhea.  +fever when it started but not now.  Took some her mom's ibuprofen and phenergan and immodium and that has helped.  Also had headache.  No one in family sick.  Does work at nursing home  Pregnancy is not a possibility.    Current Medications (verified): 1)  Ultram 50 Mg Tabs (Tramadol Hcl) .Marland Kitchen.. 1 Tablet By Mouth Every 6 Hours For Breakthrough Pain 2)  Tylenol Extra Strength 500 Mg Tabs (Acetaminophen) .... 2 Tabs By Mouth Every 8 Hours Scheduled 3)  Promethazine Hcl 25 Mg Tabs (Promethazine Hcl) .... One By Mouth Q6h As Needed Nausea  Allergies (verified): No Known Drug Allergies  Past History:  Past medical, surgical, family and social histories (including risk factors) reviewed, and no changes noted (except as noted below).  Past Medical History: Reviewed history from 02/11/2008 and no changes required. 7/04 Trichomoniasis and BV  Past Surgical History: Reviewed history from 02/11/2008 and no changes required. Pelvic US scheduled - 11/27/2002- Ovarian cyst, small fibroid Tubal ligation  Family History: Reviewed history from 01/23/2007 and no changes required. no fam hx of CAD  Social History: Reviewed history from 03/07/2009 and no changes required. 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 Best Contact # [phone] (cell)  Physical Exam  General:   Well-developed,well-nourished,in no acute distress; alert,appropriate and cooperative throughout examination  Well hydrated Lungs:  Normal respiratory effort, chest expands symmetrically. Lungs are clear to auscultation, no crackles or wheezes. Abdomen:  Bowel sounds positive,abdomen soft and  without masses, organomegaly or hernias noted.  Mild mid epigastric tenderness   Impression & Recommendations:  Problem # 1:  GASTROENTERITIS WITHOUT DEHYDRATION (ICD-558.9)  Suspect this is causing all her symptoms   Orders: [facility]- Est Level  3 (45409)  Problem # 2:  MIGRAINE HEADACHE (ICD-346.90) Headache likely due to gastroenteritis, but could have migraine componant. Her updated medication list for this problem includes:    Ultram 50 Mg Tabs (Tramadol hcl) .Marland Kitchen... 1 tablet by mouth every 6 hours for breakthrough pain    Tylenol Extra Strength 500 Mg Tabs (Acetaminophen) .Marland Kitchen... 2 tabs by mouth every 8 hours scheduled  Complete Medication List: 1)  Ultram 50 Mg Tabs (Tramadol hcl) .Marland Kitchen.. 1 tablet by mouth every 6 hours for breakthrough pain 2)  Tylenol Extra Strength 500 Mg Tabs (Acetaminophen) .... 2 tabs by mouth every 8 hours scheduled 3)  Promethazine Hcl 25 Mg Tabs (Promethazine hcl) .... One by mouth q6h as needed nausea  Patient Instructions: 1)  You likely have a stomach virus.   2)  Do not go back to work at the nursing home until symptoms clear.  Use strict hand washing since you are likely contageous. Prescriptions: PROMETHAZINE HCL 25 MG TABS (PROMETHAZINE HCL) one by mouth q6h as needed nausea  #  12 x 0   Entered and Authorized by:   Doralee Albino MD   Signed by:   Doralee Albino MD on 07/01/2009   Method used:   Electronically to        [facility] [id number] (retail)       [address] [place], [place]  [address]       Ph: [phone]       Fax: [phone]   RxID:   [id number]   Prevention & Chronic Care Immunizations   Influenza  vaccine: Not documented    Tetanus booster: 02/11/2002: Done.    Pneumococcal vaccine: Not documented  Other Screening   Pap smear: Done.  (11/15/2002)    Mammogram: Not documented   Smoking status: quit  (05/31/2009)  Lipids   Total Cholesterol: Not documented   LDL: Not documented   LDL Direct: Not documented   HDL: Not documented   Triglycerides: Not documented

## 2010-06-15 NOTE — Progress Notes (Signed)
Summary: triage   Phone Note Call from Patient Call back at [phone]   Caller: Patient Summary of Call: dizzy/nausea/vomiting Initial call taken by: De Nurse,  July 01, 2009 10:17 AM  Follow-up for Phone Call        STARTED WED NIGHT WHEN AT WORK. stayed home from work yesterday. sweaty & shaky. cold then hot. does not feel well. has diarrhea. work in at 3. unable to get a ride before then.  Follow-up by: Golden Circle RN,  July 01, 2009 10:44 AM  Additional Follow-up for Phone Call Additional follow up Details #1::        seen and dx is gastroenteritis Additional Follow-up by: Doralee Albino MD,  July 01, 2009 3:49 PM
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
## 2010-06-15 NOTE — Letter (Signed)
Summary: Out of Work  [facility]  [address]   [place], [place] [address]   Phone: [phone]  Fax: [phone]    February 11, 2008   Employee:  SHUNTEL FISHBURN    To Whom It May Concern:   For Medical reasons, please excuse the above named employee from work for the following dates:  Start:    End:    If you need additional information, please feel free to contact our office.         Sincerely,    Helane Rima MD

## 2010-06-15 NOTE — Progress Notes (Signed)
Summary: needs note   Phone Note Call from Patient Call back at Home Phone [phone]   Caller: Patient Summary of Call: couldn't go into work today and would like a note to be out and go back on Thurs Initial call taken by: De Nurse,  June 01, 2009 10:10 AM  Follow-up for Phone Call        fowarded to PCP for approval Follow-up by: Gladstone Pih,  June 01, 2009 12:41 PM  Additional Follow-up for Phone Call Additional follow up Details #1::        Called left message to call. Additional Follow-up by: Gladstone Pih,  June 01, 2009 4:08 PM    Additional Follow-up for Phone Call Additional follow up Details #2::    spoke with Pt this am, had not gone to get Xray.  Will go this Am.  Started taking meds yesterday and feeling much better today.  Returning to work today Follow-up by: Gladstone Pih,  June 02, 2009 8:55 AM
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
## 2010-06-15 NOTE — Letter (Signed)
Summary: Probation Letter  [facility]  [address]   [place], [place] [address]   Phone: [phone]  Fax: [phone]    10/05/2009  Susan Sanders [address] [place], [place]  [address]  Dear [name],  With the goal of better serving all our patients the [facility] is following each patient's missed appointments.  You have missed at least 3 appointments with our practice.If you cannot keep your appointment, we expect you to call at least 24 hours before your appointment time.  Missing appointments prevents other patients from seeing [place] and makes it difficult to provide you with the best possible medical care.      1.   If you miss one more appointment, we will only give you limited medical services. This means we will not call in medication refills, complete a form, or make a referral for you except when you are here for a scheduled office visit.    2.   If you miss 2 or more appointments in the next year, we will dismiss you from our practice.    Our office staff can be reached at [phone] Monday through Friday from 8:30 a.m.-5:00 p.m. and will be glad to schedule your appointment as necessary.    Thank you.   [facility]

## 2010-06-15 NOTE — Letter (Signed)
Summary: Out of Work  [facility]  [address]   [place], [place] [address]   Phone: [phone]  Fax: [phone]    August 15, 2009   Employee:  CEIL RODERICK    To Whom It May Concern:   For Medical reasons, please excuse the above named employee from work for the following dates:  Start:   August 15, 2009  End:   August 19, 2009  Patient may return sooner if feeling better.   If you need additional information, please feel free to contact our office.         Sincerely,    Lequita Asal  MD
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
## 2010-06-15 NOTE — Assessment & Plan Note (Signed)
Summary: acute lumbar pain   Vital Signs:  Patient profile:   40 year old female Height:      64 inches Weight:      150.5 pounds BMI:     25.93 Temp:     98.5 degrees F oral Pulse rate:   68 / minute BP sitting:   113 / 67  (right arm) Cuff size:   regular  Vitals Entered By: Garen Grams LPN (May 31, 2009 11:19 AM) CC: continued back pain, HA, and pain in right foot Pain Assessment Patient in pain? yes     Location: back Intensity: 8   Primary Care Provider:  Marisue Ivan  MD  CC:  continued back pain, HA, and and pain in right foot.  History of Present Illness: 40yo F here to f/u low back pain s/p fall 05/23/2009.    Low back pain: Acute nonradiating sharp lower back pain following a direct fall on 05/23/2009.  She was seen in the office that day and started on Flexeril and Hydrocodone.  States that the medications makes her feel worse.  No numbness, paresthesia, or weakness.  No urinary incontinence.    Habits & Providers  Alcohol-Tobacco-Diet     Tobacco Status: quit  Allergies: No Known Drug Allergies  Social History: Smoking Status:  quit  Review of Systems       No numbness, paresthesia, or weakness.  No urinary incontinence.   Physical Exam  General:  VS Reviewed. Well appearing, NAD.  Msk:  Low back exam: Inspection- No deformities, no erythema, ecchymosis, or edema Palpation- mild lumbar spine ttp; mild paralumbar ttp ROM- full flexion, extension, rotation  neg straight leg test- sitting  5/5 hip flexion, knee extension, dorsal flexion and plantar flexion of the ankle Neurologic:  5/5 hip flexion, knee extension, dorsal flexion and plantar flexion of the ankle 2+ dtrs patellar sensation of LE grossly intact nl gait   Impression & Recommendations:  Problem # 1:  LOW BACK PAIN, ACUTE (ICD-724.2) Assessment Unchanged Pain essentially unchanged and now having lumbar spine ttp.  Plan to obtain lumbar spine xrays and change pain  medication to scheduled Tylenol 1000mg  three times a day and as needed Ultram 50mg  q6 for breakthrough pain.  f/u in 4 weeks.  If no improvement at that time, will refer to PT.  The following medications were removed from the medication list:    Flexeril 10 Mg Tabs (Cyclobenzaprine hcl) ..... One by mouth tid    Hydrocodone-acetaminophen 5-325 Mg Tabs (Hydrocodone-acetaminophen) .Marland Kitchen... 1 by mouth up to 4 times per day as needed for pain Her updated medication list for this problem includes:    Ultram 50 Mg Tabs (Tramadol hcl) .Marland Kitchen... 1 tablet by mouth every 6 hours for breakthrough pain    Tylenol Extra Strength 500 Mg Tabs (Acetaminophen) .Marland Kitchen... 2 tabs by mouth every 8 hours scheduled  Orders: Radiology other (Radiology Other) [facility]- Est  Level 4 ([address])  Complete Medication List: 1)  Ultram 50 Mg Tabs (Tramadol hcl) .Marland Kitchen.. 1 tablet by mouth every 6 hours for breakthrough pain 2)  Tylenol Extra Strength 500 Mg Tabs (Acetaminophen) .... 2 tabs by mouth every 8 hours scheduled  Patient Instructions: 1)  Please schedule a follow-up appointment in 4 months .  2)  I want you to take Tylenol 500mg  extra strength (2 tablets) every 8 hours schedule. 3)  If you have breakthrough pain, take the Ultram every 6 hours (1 tablet). Prescriptions: ULTRAM 50 MG TABS (TRAMADOL HCL) 1  tablet by mouth every 6 hours for breakthrough pain  #60 x 0   Entered and Authorized by:   Marisue Ivan  MD   Signed by:   Marisue Ivan  MD on 05/31/2009   Method used:   Electronically to        [facility] [id number] (retail)       [address] [place], [place]  [address]       Ph: [phone]       Fax: [phone]   RxID:   [id number]

## 2010-06-15 NOTE — Assessment & Plan Note (Signed)
Summary: back pain/[place]   Vital Signs:  Patient profile:   40 year old female Weight:      150 pounds Temp:     97.6 degrees F oral Pulse rate:   67 / minute Pulse rhythm:   regular BP sitting:   143 / 89  (right arm) Cuff size:   regular  Vitals Entered By: [name] CMA (May 23, 2009 11:28 AM) CC: back pain Pain Assessment Patient in pain? yes     Location: lower back Intensity: 9 Type: dull Onset of pain  With activity Comments lower back pain that radiates to LLQ. pt stated that she moved this weekend, took ibupropen with no relief     Primary Care Provider:  Marisue Ivan  MD  CC:  back pain.  History of Present Illness: 40 year old AAF:  1. Back Pain: low, bilateral, x 2-3 days, after lifting boxes while moving over the weekend, no radicular pain, no weakness, no falls, no bowel or bladder incontinence, no history of prolonged corticosteroid use, no family history of osteoporosis. she tried Ibuprofen 800 mg which did not help pain. pain worse with flexion, better with rest.  Current Medications (verified): 1)  Flexeril 10 Mg Tabs (Cyclobenzaprine Hcl) .... One By Mouth Tid 2)  Hydrocodone-Acetaminophen 5-325 Mg Tabs (Hydrocodone-Acetaminophen) .Marland Kitchen.. 1 By Mouth Up To 4 Times Per Day As Needed For Pain  Allergies (verified): No Known Drug Allergies  Past History:  Past medical, surgical, family and social histories (including risk factors) reviewed for relevance to current acute and chronic problems.  Past Medical History: Reviewed history from 02/11/2008 and no changes required. 7/04 Trichomoniasis and BV  Past Surgical History: Reviewed history from 02/11/2008 and no changes required. Pelvic US scheduled - 11/27/2002- Ovarian cyst, small fibroid Tubal ligation  Family History: Reviewed history from 01/23/2007 and no changes required. no fam hx of CAD  Social History: Reviewed history from 03/07/2009 and no changes required. 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 Best Contact # [phone] (cell)  Review of Systems General:  Denies chills, fever, and malaise. GI:  Denies change in bowel habits. GU:  Denies incontinence. MS:  Complains of low back pain, muscle aches, and stiffness; denies joint pain, joint redness, and joint swelling. Neuro:  Denies falling down, numbness, tingling, and weakness.  Physical Exam  General:  VS reviewed.  Non ill appearing, NAD. Msk:  Tender to palpation along hypertonic lumbar paraspinal muscles. Endorsement of pain with flexion of hip, denial of pain with extension. Neg SLR bilaterally. No point tenderness at hip/bursa. Normal strenght and normal reflexes of LE.  Pulses:  2 + DP Extremities:  No edema. Neurologic:  Strength normal in all extremities, sensation intact to light touch, sensation intact to pinprick, gait normal, and DTRs symmetrical and normal.   Skin:  Intact without suspicious lesions or rashes. Psych:  Normally interactive, good eye contact, and not anxious appearing.     Impression & Recommendations:  Problem # 1:  LUMBAR SPRAIN AND STRAIN (ICD-847.2) Assessment New Acute low back strain after moving over the weekend. No RED FLAGS. Rec short course Flexeril and Vicodin as the patient stated that she has had no relief with Ibuprofen 800 mg. Rec warm baths, heating pad, topical creams, and anything else that sounds good to her. No heavy lifting. Restart Ibuprofen WITH FOOD for anti-inflammatory effects in 2 days. Call if getting worse or not getting  better in 2 weeks. Orders: [facility]- Est Level  3 (09811)  Complete Medication List: 1)  Flexeril 10 Mg Tabs (Cyclobenzaprine hcl) .... One by mouth tid 2)  Hydrocodone-acetaminophen 5-325 Mg Tabs (Hydrocodone-acetaminophen) .Marland Kitchen.. 1 by mouth up to 4 times per day as needed for pain  Patient Instructions: 1)  It was nice to meet you today! 2)  I am prescribing Flexeril for your  muscle spasm and Vicodin for your pain. You may also try taking hot baths, using heating pads, and over-the-counter creams for your pain. Start taking Ibuprofen WITH FOOD after about 2 days. Make sure to keep moving, but NO HEAVY LIFTING for the next few weeks. 3)  Let us know if your pain is not improving or if it is getting worse. Prescriptions: HYDROCODONE-ACETAMINOPHEN 5-325 MG TABS (HYDROCODONE-ACETAMINOPHEN) 1 by mouth up to 4 times per day as needed for pain  #30 x 0   Entered and Authorized by:   Helane Rima DO   Signed by:   Helane Rima DO on 05/23/2009   Method used:   Print then Give to Patient   RxID:   [id number] FLEXERIL 10 MG TABS (CYCLOBENZAPRINE HCL) one by mouth tid  #30 x 0   Entered and Authorized by:   Helane Rima DO   Signed by:   Helane Rima DO on 05/23/2009   Method used:   Electronically to        [facility] [id number] (retail)       [address] [place], [place]  [address]       Ph: [phone]       Fax: [phone]   RxID:   [id number]   Prevention & Chronic Care Immunizations   Influenza vaccine: Not documented    Tetanus booster: 02/11/2002: Done.    Pneumococcal vaccine: Not documented  Other Screening   Pap smear: Done.  (11/15/2002)    Mammogram: Not documented   Smoking status: current  (03/07/2009)  Lipids   Total Cholesterol: Not documented   LDL: Not documented   LDL Direct: Not documented   HDL: Not documented   Triglycerides: Not documented
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
## 2010-06-15 NOTE — Letter (Signed)
Summary: Out of Work  [facility]  [address]   [place], [place] [address]   Phone: [phone]  Fax: [phone]    June 02, 2009   Employee:  Susan Sanders    To Whom It May Concern:   For Medical reasons, please excuse the above named employee from work for the following dates:  Start:   May 31, 2009  End:   Jun 02, 2009  If you need additional information, please feel free to contact our office.         Sincerely,    Gladstone Pih

## 2010-06-15 NOTE — Letter (Signed)
Summary: Out of School  [facility]  [address]   [place], [place] [address]   Phone: [phone]  Fax: [phone]    February 11, 2008   Student:  Susan Sanders    To Whom It May Concern:   For Medical reasons, please excuse the above named student from school for the following dates:  Start:   February 11, 2008  End:     If you need additional information, please feel free to contact our office.   Sincerely,    Helane Rima MD    ****This is a legal document and cannot be tampered with.  Schools are authorized to verify all information and to do so accordingly.
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
## 2010-06-15 NOTE — Progress Notes (Signed)
Summary: triage   Phone Note Call from Patient Call back at Home Phone [phone]   Caller: Patient Summary of Call: Leg is swollen and can hardley walk on it.  Body feels hot. Initial call taken by: Clydell Hakim,  February 13, 2010 3:35 PM  Follow-up for Phone Call        r arm is swollen & burning & itchy. L leg is swollen, itchy & burning. burning started 2 hrs ago. swelling started this am at 8am. took ibuprofen 800mg  which has not helped. denies any different meds lately.  sent to UC as we are out of appts. she agreed with plan Follow-up by: Golden Circle RN,  February 13, 2010 3:37 PM

## 2010-06-26 ENCOUNTER — Encounter: Payer: Self-pay | Admitting: *Deleted

## 2010-08-22 LAB — WET PREP, GENITAL
Trich, Wet Prep: NONE SEEN
Yeast Wet Prep HPF POC: NONE SEEN

## 2010-08-22 LAB — URINALYSIS, ROUTINE W REFLEX MICROSCOPIC
Bilirubin Urine: NEGATIVE
Ketones, ur: 15 mg/dL — AB
Nitrite: NEGATIVE
Nitrite: POSITIVE — AB
Protein, ur: 100 mg/dL — AB
Specific Gravity, Urine: 1.025 (ref 1.005–1.030)
pH: 6 (ref 5.0–8.0)

## 2010-08-22 LAB — URINE MICROSCOPIC-ADD ON

## 2010-08-22 LAB — CBC
MCHC: 34.1 g/dL (ref 30.0–36.0)
MCV: 86.3 fL (ref 78.0–100.0)
Platelets: 246 10*3/uL (ref 150–400)

## 2010-08-22 LAB — URINE CULTURE

## 2010-09-13 ENCOUNTER — Telehealth: Payer: Self-pay | Admitting: Family Medicine

## 2010-09-13 NOTE — Telephone Encounter (Signed)
Heart racing, pain in back of head, chest pain that started earlier today. Pt states that this feels like her normal panic attack.  She is calling the emergency line to requesting me to refill of xanax medication. I informed pt that I can not prescribe medications or give refills over the emergency line. Of note, I do not see xanax on medication list currently.  It doesn't look like pt has been seen for evaluation of panic attacks in quite some time.  Pt states that she feels that her symptoms are under good enough control at the moment that she can wait until 8:30 am to call for a work in appointment to see one of our doctors in our clinic.  Pt instructed to go to the ER or urgent care if she begins to have any new or worsening of symptoms or if she feels that the symptoms are different that her typical panic attack symptoms.

## 2010-11-30 ENCOUNTER — Ambulatory Visit (INDEPENDENT_AMBULATORY_CARE_PROVIDER_SITE_OTHER): Payer: Self-pay | Admitting: Family Medicine

## 2010-11-30 ENCOUNTER — Encounter: Payer: Self-pay | Admitting: Family Medicine

## 2010-11-30 VITALS — BP 136/84 | HR 92 | Wt 147.0 lb

## 2010-11-30 DIAGNOSIS — M545 Low back pain: Secondary | ICD-10-CM

## 2010-11-30 MED ORDER — MELOXICAM 7.5 MG PO TABS: 7.5000 mg | ORAL_TABLET | Freq: Every day | ORAL | Status: DC

## 2010-11-30 MED ORDER — CYCLOBENZAPRINE HCL 5 MG PO TABS: 5.0000 mg | ORAL_TABLET | Freq: Three times a day (TID) | ORAL | Status: DC | PRN

## 2010-11-30 NOTE — Assessment & Plan Note (Signed)
Hx of scoliosis, no red flag signs today.  Has used vicodin in the past for this problem.  For now will treat conservatively with muscle relaxant and NSAID.  Told to return if not improving in a few days.  Patient under a significant amount of stress likely exacerbating pain, told to make appt. With Dr. Louanne Sanders for follow-up of stress and anxiety issues.

## 2010-11-30 NOTE — Progress Notes (Signed)
  Subjective:    Patient ID: Susan Sanders, female    DOB: 1970-07-26, 40 y.o.   MRN: [id number]  HPI 1.Back Pain:  Patient here with low back pain of 3 days duration.  Pt. Does have hx of scoliosis but has not had back pain in a long time.  Pain most severe on L side of back with some radiation into legs with associated tingling.  Denies weakness or numbness in her legs.  She denies bowel or bladder dysfunction, dysuria, blood in stool.  Has been taking ibuprofen and tylenol and did take one of her mom's vicodin which helped.  Has used flexeril in the past and this has worked well.  Very tearful today as she describes multiple stressors in her life causing her panic attacks to return.  Was on xanax in the past for this.  Main stressor she states is that her 73 yo son was recently incarcerated for 10 years.     Review of Systems See HPI    Objective:   Physical Exam  Constitutional: He appears well-developed and well-nourished. He appears distressed.       Very tearful and seemed to be in a good bit of pain   Cardiovascular: Normal rate, regular rhythm and normal heart sounds.   Pulmonary/Chest: Effort normal and breath sounds normal. No respiratory distress.  Musculoskeletal:       Lumbar back: He exhibits tenderness, pain and spasm. He exhibits no bony tenderness.       Back:  Neurological:       Strength and sensation normal in legs bilaterally.           Assessment & Plan:

## 2010-12-05 ENCOUNTER — Encounter: Payer: Self-pay | Admitting: Family Medicine

## 2010-12-05 ENCOUNTER — Ambulatory Visit (INDEPENDENT_AMBULATORY_CARE_PROVIDER_SITE_OTHER): Payer: Self-pay | Admitting: Family Medicine

## 2010-12-05 DIAGNOSIS — M549 Dorsalgia, unspecified: Secondary | ICD-10-CM | POA: Insufficient documentation

## 2010-12-05 DIAGNOSIS — M999 Biomechanical lesion, unspecified: Secondary | ICD-10-CM

## 2010-12-05 NOTE — Patient Instructions (Signed)
Very nice to meet you I am giving you those exercises to do try to do them 2 times a day if you can Try to wear better shoes I want to see you again in 3 weeks for more manipulation.

## 2010-12-05 NOTE — Progress Notes (Signed)
  Subjective:    Patient ID: Susan Sanders, female    DOB: [birth year], 40 y.o.   MRN: [id number]  HPI  Pt is here for f/u on back pina.  Pt has had low back pain, mostly right sided for about 2 months on and off, no true injury, seen by Dr. Ashley Royalty 5 days ago and was given flexaril and it has not helped much. Pt also taking mobic without much changes.  Pt states pain is worse with certain movements or lying on one side or another. Pt unable to work at the moment due to the pain and is supposed to be starting school in the next 2 weeks. Pt states sometime has a little radiation down the legs but usually stays localized. Still able to walk and no weakness or numbness in legs and no bowel or bladder problems.   Review of Systems Denies fever, chills, nausea vomiting abdominal pain, dysuria, chest pain, shortness of breath dyspnea on exertion or numbness in extremities Past medical history, social, surgical and family history all reviewed.      Objective:   Physical Exam Gen: NAD alert no true distress but sitting hunch over when entered the room.  CV: RRR no murmur Pul: CTAB Back:  Neg SLT + faber test on right, good ROM of hip. No spinous process tenderness mild lumbar paraspinal tenderness. NVI distally 2+ DTR OMT Findings:  Thoracic: none mild scoliosis  Lumbar: L2 rotated and side bent right Sacrum: right on right        Assessment & Plan:

## 2010-12-05 NOTE — Assessment & Plan Note (Signed)
After verbal consent pt did have HVLA, with marked improvement.  Gave side effects to look out for and can take anti inflammatories in the acute time frame.  RTC in 3 weeks.

## 2010-12-05 NOTE — Assessment & Plan Note (Signed)
After verbal consent pt did have HVLA, with marked improvement.  Gave side effects to look out for and can take anti inflammatories in the acute time frame.  RTC in 3 weeks.  

## 2010-12-13 ENCOUNTER — Ambulatory Visit (INDEPENDENT_AMBULATORY_CARE_PROVIDER_SITE_OTHER): Payer: Self-pay | Admitting: Family Medicine

## 2010-12-13 ENCOUNTER — Telehealth: Payer: Self-pay | Admitting: Family Medicine

## 2010-12-13 DIAGNOSIS — M999 Biomechanical lesion, unspecified: Secondary | ICD-10-CM

## 2010-12-13 DIAGNOSIS — M545 Low back pain: Secondary | ICD-10-CM

## 2010-12-13 DIAGNOSIS — F411 Generalized anxiety disorder: Secondary | ICD-10-CM

## 2010-12-13 LAB — POCT URINALYSIS DIPSTICK
Ketones, UA: NEGATIVE
Protein, UA: NEGATIVE
Spec Grav, UA: >=1.03
Urobilinogen, UA: 0.2

## 2010-12-13 LAB — POCT UA - MICROSCOPIC ONLY: Epithelial cells, urine per micros: >20

## 2010-12-13 MED ORDER — CYCLOBENZAPRINE HCL 5 MG PO TABS: 10.0000 mg | ORAL_TABLET | Freq: Three times a day (TID) | ORAL | Status: DC | PRN

## 2010-12-13 MED ORDER — LORAZEPAM 1 MG PO TABS: 1.0000 mg | ORAL_TABLET | Freq: Two times a day (BID) | ORAL | Status: DC

## 2010-12-13 MED ORDER — AMITRIPTYLINE HCL 25 MG PO TABS: 25.0000 mg | ORAL_TABLET | Freq: Every day | ORAL | Status: DC

## 2010-12-13 MED ORDER — METRONIDAZOLE 500 MG PO TABS: 500.0000 mg | ORAL_TABLET | Freq: Two times a day (BID) | ORAL | Status: AC

## 2010-12-13 NOTE — Telephone Encounter (Signed)
Pt informed and agreeable. Fleeger, Jessica Dawn  

## 2010-12-13 NOTE — Progress Notes (Signed)
Addended by: Madolyn Frieze, Marylene Land J on: 12/13/2010 12:29 PM   Modules accepted: Orders

## 2010-12-13 NOTE — Assessment & Plan Note (Addendum)
Worsened. She has been having this constant back pain that she was here twice previously for, but she seems to have exacerbations of this pain. It appears musculoskeletal with her paraspinous muscle being noticeable tense on that left side where she is having the the pain. And I think this pain is exacerbated by her psychosocial issues (see other A/P). But will check urinalysis also to rule-out UTI since she reports having new cloudy urine and since this would be easily treatable. I do not think this is a GU since she is having no vaginal discharge, so I will defer GU exam at this time due to her distress.  Will treat acute pain with Tylenol and ibuprofen. Will stop Mobic since it did not help. Asked her to continue warm compresses. Will refer for physical therapy. Will start amitriptyline for treat anxiety/depression and to help with pain.   Addendum: urine microscopic with many clue cells and Kendal Hymen (Research scientist (medical)) has high suspicion for trichomoniasis. Could not see any living organisms but findings of what appears to be dead organisms. Will treat BV, which will also treat trich. Recommend wet prep and GC/Chlamydia at follow-up in 1 week.

## 2010-12-13 NOTE — Patient Instructions (Addendum)
For your back pain, let's go up on the flexeril, take the Tylenol 650 every 8 hours scheduled for the next 5 days. Taken ibuprofen 600mg  every 6 hours scheduled also.   Let's also try the amitryptiline for your pain and anxiety. It may make you sleepy.   I will give you a few Ativan to take.   I will refer you to physical therapy.  PLEASE CALL DR. Pascal Lux, our psychologist, TO DISCUSS YOUR STRESSORS.   Please make a follow-up appointment for next week with Dr. Ashley Royalty, Katrinka Blazing, or Ritch.
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
## 2010-12-13 NOTE — Assessment & Plan Note (Addendum)
Patient with multiple persistent stressors. She did report occasionally wanting to hurt herself but not at this time. Would not go into details about how she would. Offered inpatient services, but she denied this. She said if these thoughts became severe or worrisome, she would return to the clinic. I strongly encouraged her to do this, and notified her I would be happy to see her anytime this week since I have clinic. Asked her to follow-up in 1 week. Will start amitriptyline for her pain and anxiety. Hopefully this will give her more immediate relief. Considered starting her on SSRI, but her history of possible manic episodes concerning. Never given formal diagnosis of mania or depression. Strongly encouraged her to call Dr. Pascal Lux and make an appointment. Did emphasize to her that her stressors are exacerbating her pain symptoms. Will give small quantity of Ativan to help her anxiety.

## 2010-12-13 NOTE — Telephone Encounter (Signed)
Called mobile but her mother picked-up.  Her mother asked me to call this number [phone], but no answer.  I would like to inform her that her urinalysis showed some bacterial vaginosis in her urine. I want to treat her with antibiotic for this because it may be contributing to her pain. Please reassure her that BV is not an STI and that it just something that may happen in normal healthy females.   Thank you.

## 2010-12-14 LAB — GC/CHLAMYDIA PROBE AMP, URINE: Chlamydia, Swab/Urine, PCR: NEGATIVE

## 2010-12-20 ENCOUNTER — Ambulatory Visit: Payer: Self-pay | Admitting: Family Medicine

## 2010-12-28 ENCOUNTER — Ambulatory Visit: Payer: Self-pay | Admitting: Family Medicine

## 2011-01-04 ENCOUNTER — Ambulatory Visit: Payer: Self-pay | Admitting: Family Medicine

## 2011-01-29 ENCOUNTER — Ambulatory Visit (INDEPENDENT_AMBULATORY_CARE_PROVIDER_SITE_OTHER): Payer: Self-pay | Admitting: Family Medicine

## 2011-01-29 ENCOUNTER — Encounter: Payer: Self-pay | Admitting: Family Medicine

## 2011-01-29 VITALS — BP 123/78 | HR 88 | Temp 98.9°F | Ht 65.0 in | Wt 149.0 lb

## 2011-01-29 DIAGNOSIS — A088 Other specified intestinal infections: Secondary | ICD-10-CM

## 2011-01-29 DIAGNOSIS — A084 Viral intestinal infection, unspecified: Secondary | ICD-10-CM

## 2011-01-29 NOTE — Progress Notes (Deleted)
  Subjective:    Patient ID: Susan Sanders, female    DOB: Nov 13, 1970, 40 y.o.   MRN: [id number]  HPI    Review of Systems     Objective:   Physical Exam        Assessment & Plan:

## 2011-01-29 NOTE — Progress Notes (Deleted)
  Subjective:    Patient ID: Susan Sanders, female    DOB: 12/30/1970, 40 y.o.   MRN: [id number]  HPI    Review of Systems     Objective:   Physical Exam        Assessment & Plan:   

## 2011-01-29 NOTE — Progress Notes (Signed)
  Subjective:     Susan Sanders is a 40 y.o. female who presents for evaluation of nonbilious vomiting 3 times per day. Symptoms have been present for 3 days. Patient denies blood in stool, constipation, dysuria, hematuria and melena. Patient's oral intake has been decreased for liquids and decreased for solids. Patient's urine output has been adequate. Other contacts with similar symptoms include: nobody. Patient denies recent travel history. Patient has not had recent ingestion of possible contaminated food, toxic plants, or inappropriate medications/poisons.   The following portions of the patient's history were reviewed and updated as appropriate: allergies, current medications, past family history, past medical history, past social history, past surgical history and problem list.  Review of Systems Pertinent items are noted in HPI.    Objective:     BP 123/78  Pulse 88  Temp(Src) 98.9 F (37.2 C) (Oral)  Ht 5\' 5"  (1.651 m)  Wt 149 lb (67.586 kg)  BMI 24.79 kg/m2  LMP 01/28/2011  General Appearance:    Alert, cooperative, no distress, appears stated age  Head:    Normocephalic, without obvious abnormality, atraumatic  Eyes:    PERRL, conjunctiva/corneas clear, EOM's intact, fundi    benign, both eyes  Ears:    Normal TM's and external ear canals, both ears  Nose:   Nares normal, septum midline, mucosa normal, no drainage    or sinus tenderness  Throat:   Lips, mucosa, and tongue normal; teeth and gums normal  Neck:   Supple, symmetrical, trachea midline, no adenopathy;    thyroid:  no enlargement/tenderness/nodules; no carotid   bruit or JVD  Back:     Symmetric, no curvature, ROM normal, no CVA tenderness  Lungs:     Clear to auscultation bilaterally, respirations unlabored  Chest Wall:    No tenderness or deformity   Heart:    Regular rate and rhythm, S1 and S2 normal, no murmur, rub   or gallop  Breast Exam:    No tenderness, masses, or nipple abnormality  Abdomen:     Soft,  non-tender, bowel sounds active all four quadrants,    no masses, no organomegaly  Genitalia:    Normal female without lesion, discharge or tenderness  Rectal:    Normal tone, normal prostate, no masses or tenderness;   guaiac negative stool  Extremities:   Extremities normal, atraumatic, no cyanosis or edema  Pulses:   2+ and symmetric all extremities  Skin:   Skin color, texture, turgor normal, no rashes or lesions  Lymph nodes:   Cervical, supraclavicular, and axillary nodes normal  Neurologic:   CNII-XII intact, normal strength, sensation and reflexes    throughout      Assessment:    Acute Gastroenteritis    Plan:    1. Discussed oral rehydration, reintroduction of solid foods, signs of dehydration. 2. Return or go to emergency department if worsening symptoms, blood or bile, signs of dehydration, diarrhea lasting longer than 5 days or any new concerns. 3. Follow up in PRN . or sooner as needed.

## 2011-03-16 ENCOUNTER — Ambulatory Visit: Payer: Self-pay

## 2011-04-29 ENCOUNTER — Emergency Department (HOSPITAL_COMMUNITY): Payer: Medicaid Other

## 2011-04-29 ENCOUNTER — Emergency Department (HOSPITAL_COMMUNITY)
Admission: EM | Admit: 2011-04-29 | Discharge: 2011-04-29 | Disposition: A | Discharge status: Home Health | Payer: Medicaid Other | Attending: Emergency Medicine | Admitting: Emergency Medicine

## 2011-04-29 ENCOUNTER — Encounter (HOSPITAL_COMMUNITY): Payer: Self-pay | Admitting: Emergency Medicine

## 2011-04-29 DIAGNOSIS — S139XXA Sprain of joints and ligaments of unspecified parts of neck, initial encounter: Secondary | ICD-10-CM

## 2011-04-29 DIAGNOSIS — M25519 Pain in unspecified shoulder: Secondary | ICD-10-CM | POA: Insufficient documentation

## 2011-04-29 DIAGNOSIS — M79609 Pain in unspecified limb: Secondary | ICD-10-CM | POA: Insufficient documentation

## 2011-04-29 DIAGNOSIS — M545 Low back pain, unspecified: Secondary | ICD-10-CM | POA: Insufficient documentation

## 2011-04-29 DIAGNOSIS — R51 Headache: Secondary | ICD-10-CM | POA: Insufficient documentation

## 2011-04-29 DIAGNOSIS — M549 Dorsalgia, unspecified: Secondary | ICD-10-CM

## 2011-04-29 MED ORDER — KETOROLAC TROMETHAMINE 30 MG/ML IJ SOLN
30.0000 mg | Freq: Once | INTRAMUSCULAR | Status: AC
  Administered 2011-04-29: 30 mg via INTRAVENOUS
  Filled 2011-04-29: qty 1

## 2011-04-29 MED ORDER — IBUPROFEN 600 MG PO TABS: 600.0000 mg | ORAL_TABLET | Freq: Four times a day (QID) | ORAL | Status: AC | PRN

## 2011-04-29 MED ORDER — HYDROCODONE-ACETAMINOPHEN 5-500 MG PO TABS: 1.0000 | ORAL_TABLET | Freq: Four times a day (QID) | ORAL | Status: AC | PRN

## 2011-04-29 MED ORDER — CYCLOBENZAPRINE HCL 10 MG PO TABS: 10.0000 mg | ORAL_TABLET | Freq: Two times a day (BID) | ORAL | Status: AC | PRN
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
## 2011-04-29 NOTE — ED Notes (Signed)
IN XRAY
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
## 2011-04-30 NOTE — ED Provider Notes (Signed)
Medical screening examination/treatment/procedure(s) were performed by non-physician practitioner and as supervising physician I was immediately available for consultation/collaboration.   Glynn Octave, MD 04/30/11 [phone]

## 2011-05-03 ENCOUNTER — Ambulatory Visit: Payer: No Typology Code available for payment source | Admitting: Family Medicine

## 2011-05-25 ENCOUNTER — Ambulatory Visit: Payer: No Typology Code available for payment source

## 2011-05-25 ENCOUNTER — Ambulatory Visit (INDEPENDENT_AMBULATORY_CARE_PROVIDER_SITE_OTHER): Payer: No Typology Code available for payment source | Admitting: Family Medicine

## 2011-05-25 ENCOUNTER — Encounter: Payer: Self-pay | Admitting: Family Medicine

## 2011-05-25 VITALS — BP 135/80 | HR 70 | Temp 97.9°F | Ht 65.0 in | Wt 151.6 lb

## 2011-05-25 DIAGNOSIS — S139XXA Sprain of joints and ligaments of unspecified parts of neck, initial encounter: Secondary | ICD-10-CM

## 2011-05-25 MED ORDER — MELOXICAM 15 MG PO TABS: 15.0000 mg | ORAL_TABLET | Freq: Every day | ORAL | Status: DC
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
## 2011-05-25 NOTE — Patient Instructions (Signed)
Keep taking flexeril (muscle relaxant)  Change ibuprofen to meloxicam  Make a follow-up with your regular doctor for 2 weeks to see how your are doing    Cervical Sprain and Strain A cervical sprain is an injury to the neck. The injury can include either over-stretching or even small tears in the ligaments that hold the bones of the neck in place. A strain affects muscles and tendons. Minor injuries usually only involve ligaments and muscles. Because the different parts of the neck are so close together, more severe injuries can involve both sprain and strain. These injuries can affect the muscles, ligaments, tendons, discs, and nerves in the neck. CAUSES   An injury may be the result of a direct blow or from certain habits that can lead to the symptoms noted above.  Injury from:     Contact sports (such as football, rugby, wrestling, hockey, auto racing, gymnastics, diving, martial arts, and boxing).     Motor vehicle accidents.     Whiplash injuries (see image at right). These are common. They occur when the neck is forcefully whipped or forced backward and/or forward.     Falls.    Lifestyle or awkward postures:     Cradling a telephone between the ear and shoulder.     Sitting in a chair that offers no support.     Working at an Theme park manager station.     Activities that require hours of repeated or long periods of looking up (stretching the neck backward) or looking down (bending the head/neck forward).  SYMPTOMS    Pain, soreness, stiffness, or burning sensation in the front, back, or sides of the neck. This may develop immediately after injury. Onset of discomfort may also develop slowly and not begin for 24 hours or more.     Shoulder and/or upper back pain.     Limits to the normal movement of the neck.     Headache.    Dizziness.    Weakness and/or abnormal sensation (such as numbness or tingling) of one or both arms and/or hands.     Muscle spasm.       Difficulty with swallowing or chewing.     Tenderness and swelling at the injury site.  DIAGNOSIS   Most of the time, your caregiver can diagnose this problem with a careful history and examination. The history will include information about known problems (such as arthritis in the neck) or a previous neck injury. X-rays may be ordered to find out if there is a different problem. X-rays can also help to find problems with the bones of the neck not related to the injury or current symptoms. TREATMENT   Several treatment options are available to help pain, spasm, and other symptoms. They include:  Cold helps relieve pain and reduce inflammation. Cold should be applied for 10 to 15 minutes every 2 to 3 hours after any activity that aggravates your symptoms. Use ice packs or an ice massage. Place a towel or cloth in between your skin and the ice pack.     Medication:    Only take over-the-counter or prescription medicines for pain, discomfort, or fever as directed by your caregiver.     Pain relievers or muscle relaxants may be prescribed. Use only as directed and only as much as you need.     Change in the activity that caused the problem. This might include using a headset with a telephone so that the phone is not propped between  your ear and shoulder.     Neck collar. Your caregiver may recommend temporary use of a soft cervical collar.     Work station. Changes may be needed in your work place. A better sitting position and/or better posture during work may be part of your treatment.     Physical Therapy. Your caregiver may recommend physical therapy. This can include instructions in the use of stretching and strengthening exercises. Improvement in posture is important. Exercises and posture training can help stabilize the neck and strengthen muscles and keep symptoms from returning.  HOME CARE INSTRUCTIONS   Other than formal physical therapy, all treatments above can be done at home.  Even when not at work, it is important to be conscious of your posture and of activities that can cause a return of symptoms. Most cervical sprains and/or strains are better in 1-3 weeks. As you improve and increase activities, doing a warm up and stretching before the activity will help prevent recurrent problems. SEEK MEDICAL CARE IF:    Pain is not effectively controlled with medication.     You feel unable to decrease pain medication over time as planned.     Activity level is not improving as planned and/or expected.  SEEK IMMEDIATE MEDICAL CARE IF:    While using medication, you develop any bleeding, stomach upset, or signs of an allergic reaction.     Symptoms get worse, become intolerable, and are not helped by medications.     New, unexplained symptoms develop.     You experience numbness, tingling, weakness, or paralysis of any part of your body.  MAKE SURE YOU:    Understand these instructions.     Will watch your condition.     Will get help right away if you are not doing well or get worse.  Document Released: 02/25/2007 Document Revised: 01/10/2011 Document Reviewed: 02/25/2007 [facility] Patient Information 2012 [place], [place].

## 2011-07-14 ENCOUNTER — Encounter (HOSPITAL_COMMUNITY): Payer: Self-pay | Admitting: Emergency Medicine

## 2011-07-14 ENCOUNTER — Emergency Department (HOSPITAL_COMMUNITY)
Admission: EM | Admit: 2011-07-14 | Discharge: 2011-07-14 | Disposition: A | Discharge status: Home / Self Care | Payer: Medicaid Other | Attending: Emergency Medicine | Admitting: Emergency Medicine

## 2011-07-14 DIAGNOSIS — M545 Low back pain, unspecified: Secondary | ICD-10-CM | POA: Insufficient documentation

## 2011-07-14 DIAGNOSIS — M25559 Pain in unspecified hip: Secondary | ICD-10-CM | POA: Insufficient documentation

## 2011-07-14 DIAGNOSIS — G8929 Other chronic pain: Secondary | ICD-10-CM | POA: Insufficient documentation

## 2011-07-14 HISTORY — DX: Scoliosis, unspecified: M41.9

## 2011-07-14 LAB — URINE MICROSCOPIC-ADD ON

## 2011-07-14 LAB — URINALYSIS, ROUTINE W REFLEX MICROSCOPIC
Ketones, ur: 40 mg/dL — AB
Leukocytes, UA: NEGATIVE
Nitrite: NEGATIVE
Specific Gravity, Urine: 1.029 (ref 1.005–1.030)
Urobilinogen, UA: 0.2 mg/dL (ref 0.0–1.0)
pH: 5.5 (ref 5.0–8.0)

## 2011-07-14 MED ORDER — IBUPROFEN 800 MG PO TABS: 800.0000 mg | ORAL_TABLET | Freq: Three times a day (TID) | ORAL | Status: AC | PRN

## 2011-07-14 MED ORDER — DIAZEPAM 5 MG PO TABS
5.0000 mg | ORAL_TABLET | Freq: Once | ORAL | Status: AC
  Administered 2011-07-14: 5 mg via ORAL
  Filled 2011-07-14: qty 1

## 2011-07-14 MED ORDER — DIAZEPAM 5 MG PO TABS: 5.0000 mg | ORAL_TABLET | Freq: Three times a day (TID) | ORAL | Status: AC | PRN

## 2011-07-14 NOTE — ED Provider Notes (Signed)
Medical screening examination/treatment/procedure(s) were performed by non-physician practitioner and as supervising physician I was immediately available for consultation/collaboration.   Celene Kras, MD 07/14/11 [phone]

## 2011-07-14 NOTE — Discharge Instructions (Signed)
Back Pain, Adult Low back pain is very common. About 1 in 5 people have back pain.The cause of low back pain is rarely dangerous. The pain often gets better over time.About half of people with a sudden onset of back pain feel better in just 2 weeks. About 8 in 10 people feel better by 6 weeks.  CAUSES Some common causes of back pain include:  Strain of the muscles or ligaments supporting the spine.   Wear and tear (degeneration) of the spinal discs.   Arthritis.   Direct injury to the back.  DIAGNOSIS Most of the time, the direct cause of low back pain is not known.However, back pain can be treated effectively even when the exact cause of the pain is unknown.Answering your caregiver's questions about your overall health and symptoms is one of the most accurate ways to make sure the cause of your pain is not dangerous. If your caregiver needs more information, he or she may order lab work or imaging tests (X-rays or MRIs).However, even if imaging tests show changes in your back, this usually does not require surgery. HOME CARE INSTRUCTIONSBack Exercises Back exercises help treat and prevent back injuries. The goal of back exercises is to increase the strength of your abdominal and back muscles and the flexibility of your back. These exercises should be started when you no longer have back pain. Back exercises include:  Pelvic Tilt. Lie on your back with your knees bent. Tilt your pelvis until the lower part of your back is against the floor. Hold this position 5 to 10 sec and repeat 5 to 10 times.   Knee to Chest. Pull first 1 knee up against your chest and hold for 20 to 30 seconds, repeat this with the other knee, and then both knees. This may be done with the other leg straight or bent, whichever feels better.   Sit-Ups or Curl-Ups. Bend your knees 90 degrees. Start with tilting your pelvis, and do a partial, slow sit-up, lifting your trunk only 30 to 45 degrees off the floor. Take at  least 2 to 3 seconds for each sit-up. Do not do sit-ups with your knees out straight. If partial sit-ups are difficult, simply do the above but with only tightening your abdominal muscles and holding it as directed.   Hip-Lift. Lie on your back with your knees flexed 90 degrees. Push down with your feet and shoulders as you raise your hips a couple inches off the floor; hold for 10 seconds, repeat 5 to 10 times.   Back arches. Lie on your stomach, propping yourself up on bent elbows. Slowly press on your hands, causing an arch in your low back. Repeat 3 to 5 times. Any initial stiffness and discomfort should lessen with repetition over time.   Shoulder-Lifts. Lie face down with arms beside your body. Keep hips and torso pressed to floor as you slowly lift your head and shoulders off the floor.  Do not overdo your exercises, especially in the beginning. Exercises may cause you some mild back discomfort which lasts for a few minutes; however, if the pain is more severe, or lasts for more than 15 minutes, do not continue exercises until you see your caregiver. Improvement with exercise therapy for back problems is slow.  See your caregivers for assistance with developing a proper back exercise program. Document Released: 06/07/2004 Document Revised: 12/27/2010 Document Reviewed: 04/30/2005 [facility] Patient Information 2012 [place], [place]. For many people, back pain returns.Since low back pain is rarely  dangerous, it is often a condition that people can learn to [facility] their own.   Remain active. It is stressful on the back to sit or stand in one place. Do not sit, drive, or stand in one place for more than 30 minutes at a time. Take short walks on level surfaces as soon as pain allows.Try to increase the length of time you walk each day.   Do not stay in bed.Resting more than 1 or 2 days can delay your recovery.   Do not avoid exercise or work.Your body is made to move.It is not dangerous to  be active, even though your back may hurt.Your back will likely heal faster if you return to being active before your pain is gone.   Pay attention to your body when you bend and lift. Many people have less discomfortwhen lifting if they bend their knees, keep the load close to their bodies,and avoid twisting. Often, the most comfortable positions are those that put less stress on your recovering back.   Find a comfortable position to sleep. Use a firm mattress and lie on your side with your knees slightly bent. If you lie on your back, put a pillow under your knees.   Only take over-the-counter or prescription medicines as directed by your caregiver. Over-the-counter medicines to reduce pain and inflammation are often the most helpful.Your caregiver may prescribe muscle relaxant drugs.These medicines help dull your pain so you can more quickly return to your normal activities and healthy exercise.   Put ice on the injured area.   Put ice in a plastic bag.   Place a towel between your skin and the bag.   Leave the ice on for 15 to 20 minutes, 3 to 4 times a day for the first 2 to 3 days. After that, ice and heat may be alternated to reduce pain and spasms.   Ask your caregiver about trying back exercises and gentle massage. This may be of some benefit.   Avoid feeling anxious or stressed.Stress increases muscle tension and can worsen back pain.It is important to recognize when you are anxious or stressed and learn ways to manage it.Exercise is a great option.  SEEK MEDICAL CARE IF:  You have pain that is not relieved with rest or medicine.   You have pain that does not improve in 1 week.   You have new symptoms.   You are generally not feeling well.  SEEK IMMEDIATE MEDICAL CARE IF:   You have pain that radiates from your back into your legs.   You develop new bowel or bladder control problems.   You have unusual weakness or numbness in your arms or legs.   You develop  nausea or vomiting.   You develop abdominal pain.   You feel faint.  Document Released: 04/30/2005 Document Revised: 01/10/2011 Document Reviewed: 09/18/2010 [facility] Patient Information 2012 [place], [place].

## 2011-07-14 NOTE — ED Provider Notes (Signed)
History     CSN: [id number]  Arrival date & time 07/14/11  1008   First MD Initiated Contact with Patient 07/14/11 1120      Chief Complaint  Patient presents with  . Back Pain  . Hip Pain    (Consider location/radiation/quality/duration/timing/severity/associated sxs/prior treatment) HPI Comments: Patient with chronic back pain, and notes increase in pain of her lower back for the past two days.  States that she is having pain and swelling in her left side as well which is a change from her chronic back pain.  States that two nights ago her temperature was 100.6.  Denies weakness or numbness of the legs, abdominal pain, urinary symptoms, abnormal vaginal discharge or bleeding, change in bowel habits.  No new injury to her back, no recent heavy lifting.    Patient is a 41 y.o. female presenting with back pain and hip pain. The history is provided by the patient.  Back Pain  Pertinent negatives include no chest pain, no numbness, no abdominal pain, no dysuria, no pelvic pain and no weakness.  Hip Pain Pertinent negatives include no abdominal pain, chest pain, numbness, vomiting or weakness.    Past Medical History  Diagnosis Date  . Scoliosis     Past Surgical History  Procedure Date  . Tubal ligation     History reviewed. No pertinent family history.  History  Substance Use Topics  . Smoking status: Former Games developer  . Smokeless tobacco: Not on file  . Alcohol Use: No    OB History    Grav Para Term Preterm Abortions TAB SAB Ect Mult Living                  Review of Systems  Respiratory: Negative for shortness of breath.   Cardiovascular: Negative for chest pain.  Gastrointestinal: Negative for vomiting, abdominal pain, diarrhea and constipation.  Genitourinary: Negative for dysuria, frequency, hematuria, vaginal bleeding, vaginal discharge, difficulty urinating, menstrual problem and pelvic pain.  Musculoskeletal: Positive for back pain.  Neurological: Negative  for weakness and numbness.  All other systems reviewed and are negative.    Allergies  Amoxicillin  Home Medications   Current Outpatient Rx  Name Route Sig Dispense Refill  . IBUPROFEN 200 MG PO TABS Oral Take 400 mg by mouth every 6 (six) hours as needed. For pain    . MENTHOL (TOPICAL ANALGESIC) 8 % EX CREA Apply externally Apply 1 application topically 3 (three) times daily as needed. For back pain      BP 118/83  Pulse 73  Temp(Src) 97.9 F (36.6 C) (Oral)  Resp 16  SpO2 100%  LMP 07/14/2011  Physical Exam  Nursing note and vitals reviewed. Constitutional: She is oriented to person, place, and time. She appears well-developed and well-nourished.  HENT:  Head: Normocephalic and atraumatic.  Neck: Normal range of motion. Neck supple.  Cardiovascular: Normal rate and regular rhythm.   Pulmonary/Chest: Effort normal and breath sounds normal.  Abdominal: Soft. She exhibits no distension and no mass. There is no tenderness. There is no rebound and no guarding.  Musculoskeletal: Normal range of motion. She exhibits no edema and no tenderness.       Cervical back: Normal.       Thoracic back: Normal.       Lumbar back: She exhibits tenderness and pain. She exhibits no swelling and no deformity.       Diffuse tenderness throughout, low back.  No crepitus, no step-offs, or skin changes.  Lower extremities with five out of five strength, sensation intact, distal pulses intact.  Neurological: She is alert and oriented to person, place, and time. She has normal strength. No sensory deficit.    ED Course  Procedures (including critical care time)  Labs Reviewed  URINALYSIS, ROUTINE W REFLEX MICROSCOPIC - Abnormal; Notable for the following:    APPearance CLOUDY (*)    Hgb urine dipstick TRACE (*)    Bilirubin Urine SMALL (*)    Ketones, ur 40 (*)    Protein, ur 30 (*)    All other components within normal limits  POCT PREGNANCY, URINE  URINE MICROSCOPIC-ADD ON   No  results found.  12:44 PM Patient reports great relief of back pain after valium.    1. Low back pain       MDM  Patient with acute on chronic back pain, diffuse tenderness of back, no localized tenderness, no neurological deficits.  Pt reports low grade fever at home two nights ago though I do not believe this is related to her back and patient is afebrile here.  Pt appears to have gotten complete relief from valium.  Pt d/c home with valium and PCP follow up.          Rise Patience, [place] 07/14/11 1525

## 2011-07-14 NOTE — ED Notes (Signed)
Pt reports pain and swelling to left low back/hip x 2 days. Pt reports changed her room around recently but did not lift anything heavy.

## 2011-07-14 NOTE — ED Notes (Signed)
Pt still waiting to be seen by Trixie Dredge, PA.

## 2011-08-21 ENCOUNTER — Ambulatory Visit (INDEPENDENT_AMBULATORY_CARE_PROVIDER_SITE_OTHER): Payer: Medicaid Other | Admitting: Family Medicine

## 2011-08-21 ENCOUNTER — Inpatient Hospital Stay (HOSPITAL_COMMUNITY)
Admission: AD | Admit: 2011-08-21 | Discharge: 2011-08-22 | DRG: 313 | Disposition: A | Discharge status: Home / Self Care | Payer: Medicaid Other | Source: Ambulatory Visit | Attending: Family Medicine | Admitting: Family Medicine

## 2011-08-21 ENCOUNTER — Other Ambulatory Visit: Payer: Self-pay

## 2011-08-21 ENCOUNTER — Encounter (HOSPITAL_COMMUNITY): Payer: Self-pay | Admitting: General Practice

## 2011-08-21 ENCOUNTER — Inpatient Hospital Stay (HOSPITAL_COMMUNITY): Payer: Medicaid Other

## 2011-08-21 ENCOUNTER — Encounter: Payer: Self-pay | Admitting: Family Medicine

## 2011-08-21 ENCOUNTER — Ambulatory Visit (HOSPITAL_COMMUNITY)
Admission: RE | Admit: 2011-08-21 | Discharge: 2011-08-21 | Disposition: A | Discharge status: Home / Self Care | Payer: Medicaid Other | Source: Ambulatory Visit | Admitting: Family Medicine

## 2011-08-21 DIAGNOSIS — R0602 Shortness of breath: Secondary | ICD-10-CM | POA: Diagnosis present

## 2011-08-21 DIAGNOSIS — F411 Generalized anxiety disorder: Secondary | ICD-10-CM

## 2011-08-21 DIAGNOSIS — F329 Major depressive disorder, single episode, unspecified: Secondary | ICD-10-CM

## 2011-08-21 DIAGNOSIS — R079 Chest pain, unspecified: Principal | ICD-10-CM | POA: Diagnosis present

## 2011-08-21 DIAGNOSIS — F341 Dysthymic disorder: Secondary | ICD-10-CM | POA: Diagnosis present

## 2011-08-21 DIAGNOSIS — F419 Anxiety disorder, unspecified: Secondary | ICD-10-CM | POA: Insufficient documentation

## 2011-08-21 HISTORY — DX: Other complications of anesthesia, initial encounter: T88.59XA

## 2011-08-21 HISTORY — DX: Adverse effect of unspecified anesthetic, initial encounter: T41.45XA

## 2011-08-21 HISTORY — DX: Depression, unspecified: F32.A

## 2011-08-21 HISTORY — DX: Major depressive disorder, single episode, unspecified: F32.9

## 2011-08-21 LAB — CARDIAC PANEL(CRET KIN+CKTOT+MB+TROPI)
CK, MB: 1.2 ng/mL (ref 0.3–4.0)
Relative Index: INVALID (ref 0.0–2.5)
Total CK: 68 U/L (ref 7–177)
Troponin I: <0.3 ng/mL (ref <0.30)

## 2011-08-21 LAB — COMPREHENSIVE METABOLIC PANEL
Alkaline Phosphatase: 45 U/L (ref 39–117)
BUN: 12 mg/dL (ref 6–23)
Calcium: 9.5 mg/dL (ref 8.4–10.5)
Creatinine, Ser: 0.8 mg/dL (ref 0.50–1.10)
GFR calc Af Amer: >90 mL/min (ref >90)
Glucose, Bld: 97 mg/dL (ref 70–99)
Potassium: 3.7 mEq/L (ref 3.5–5.1)
Total Protein: 6.9 g/dL (ref 6.0–8.3)

## 2011-08-21 LAB — D-DIMER, QUANTITATIVE: D-Dimer, Quant: <0.22 ug/mL-FEU (ref 0.00–0.48)

## 2011-08-21 LAB — CBC
HCT: 35.2 % — ABNORMAL LOW (ref 36.0–46.0)
Hemoglobin: 11.7 g/dL — ABNORMAL LOW (ref 12.0–15.0)
MCH: 27.3 pg (ref 26.0–34.0)
MCHC: 33.2 g/dL (ref 30.0–36.0)
MCV: 82.2 fL (ref 78.0–100.0)

## 2011-08-21 MED ORDER — MORPHINE SULFATE 2 MG/ML IJ SOLN
1.0000 mg | INTRAMUSCULAR | Status: DC | PRN
  Administered 2011-08-21: 1 mg via INTRAVENOUS

## 2011-08-21 MED ORDER — ASPIRIN EC 81 MG PO TBEC
81.0000 mg | DELAYED_RELEASE_TABLET | Freq: Every day | ORAL | Status: DC
  Administered 2011-08-21 – 2011-08-22 (×2): 81 mg via ORAL
  Filled 2011-08-21 (×2): qty 1

## 2011-08-21 MED ORDER — SODIUM CHLORIDE 0.9 % IJ SOLN
3.0000 mL | INTRAMUSCULAR | Status: DC | PRN
Start: 2011-08-21 — End: 2011-08-22

## 2011-08-21 MED ORDER — HYDROCODONE-ACETAMINOPHEN 5-325 MG PO TABS
1.0000 | ORAL_TABLET | ORAL | Status: DC | PRN
  Administered 2011-08-21: 2 via ORAL
  Filled 2011-08-21: qty 2

## 2011-08-21 MED ORDER — ACETAMINOPHEN 325 MG PO TABS
650.0000 mg | ORAL_TABLET | Freq: Four times a day (QID) | ORAL | Status: DC | PRN
  Administered 2011-08-22: 650 mg via ORAL
  Filled 2011-08-21: qty 2

## 2011-08-21 MED ORDER — SODIUM CHLORIDE 0.9 % IV SOLN: 250.0000 mL | INTRAVENOUS | Status: DC | PRN

## 2011-08-21 MED ORDER — DIPHENHYDRAMINE HCL 25 MG PO CAPS: 25.0000 mg | ORAL_CAPSULE | ORAL | Status: DC | PRN

## 2011-08-21 MED ORDER — NITROGLYCERIN 0.4 MG SL SUBL
SUBLINGUAL_TABLET | SUBLINGUAL | Status: AC
  Filled 2011-08-21: qty 25

## 2011-08-21 MED ORDER — NITROGLYCERIN 0.4 MG SL SUBL
SUBLINGUAL_TABLET | SUBLINGUAL | Status: AC
  Administered 2011-08-21: 0.4 mg
  Filled 2011-08-21: qty 25

## 2011-08-21 MED ORDER — DOCUSATE SODIUM 100 MG PO CAPS
100.0000 mg | ORAL_CAPSULE | Freq: Two times a day (BID) | ORAL | Status: DC
  Administered 2011-08-21 – 2011-08-22 (×2): 100 mg via ORAL
  Filled 2011-08-21 (×3): qty 1

## 2011-08-21 MED ORDER — ZOLPIDEM TARTRATE 5 MG PO TABS: 10.0000 mg | ORAL_TABLET | Freq: Every evening | ORAL | Status: DC | PRN

## 2011-08-21 MED ORDER — ONDANSETRON HCL 4 MG/2ML IJ SOLN: 4.0000 mg | Freq: Four times a day (QID) | INTRAMUSCULAR | Status: DC | PRN

## 2011-08-21 MED ORDER — HEPARIN SODIUM (PORCINE) 5000 UNIT/ML IJ SOLN
5000.0000 [IU] | Freq: Three times a day (TID) | INTRAMUSCULAR | Status: DC
  Administered 2011-08-21 – 2011-08-22 (×3): 5000 [IU] via SUBCUTANEOUS
  Filled 2011-08-21 (×5): qty 1

## 2011-08-21 MED ORDER — ONDANSETRON HCL 4 MG PO TABS: 4.0000 mg | ORAL_TABLET | Freq: Four times a day (QID) | ORAL | Status: DC | PRN

## 2011-08-21 MED ORDER — LORAZEPAM 1 MG PO TABS: 1.0000 mg | ORAL_TABLET | Freq: Three times a day (TID) | ORAL | Status: DC | PRN

## 2011-08-21 MED ORDER — SENNA 8.6 MG PO TABS
1.0000 | ORAL_TABLET | Freq: Two times a day (BID) | ORAL | Status: DC
  Administered 2011-08-21 – 2011-08-22 (×2): 8.6 mg via ORAL
  Filled 2011-08-21 (×3): qty 1

## 2011-08-21 MED ORDER — ACETAMINOPHEN 650 MG RE SUPP: 650.0000 mg | Freq: Four times a day (QID) | RECTAL | Status: DC | PRN

## 2011-08-21 MED ORDER — SODIUM CHLORIDE 0.9 % IJ SOLN
3.0000 mL | Freq: Two times a day (BID) | INTRAMUSCULAR | Status: DC
  Administered 2011-08-21 – 2011-08-22 (×2): 3 mL via INTRAVENOUS

## 2011-08-21 MED ORDER — MORPHINE SULFATE 2 MG/ML IJ SOLN
INTRAMUSCULAR | Status: AC
  Filled 2011-08-21: qty 1

## 2011-08-21 MED ORDER — SERTRALINE HCL 50 MG PO TABS
50.0000 mg | ORAL_TABLET | Freq: Every day | ORAL | Status: DC
  Administered 2011-08-21 – 2011-08-22 (×2): 50 mg via ORAL
  Filled 2011-08-21 (×2): qty 1

## 2011-08-21 MED ORDER — SODIUM CHLORIDE 0.9 % IJ SOLN: 3.0000 mL | Freq: Two times a day (BID) | INTRAMUSCULAR | Status: DC

## 2011-08-21 NOTE — H&P (Signed)
I have seen Ms. Innes with Dr. Denyse Amass and reviewed his note. I agree with his plan of care and his note. Please see his note for details. In short, this is a 41 yo former smoker with + FH for early heart dz who presents with 2 days of intermittent SSCP.  She also has significant depression.  Will admit to r/o cardiac cause, consider antidepressant.

## 2011-08-21 NOTE — Progress Notes (Signed)
Pt arrived to floor as a direct admit from family practice clinic.  Pt arrived with complaints of 6/10 chest pain-pt describes pain as pressure to R side of chest.  MD notified. EKG performed.  Pt given 1 sublingual nitro per protocol with no relief.  New orders received to administer 1 mg morphine prn for chest pain.  Will continue to monitor. Efraim Kaufmann

## 2011-08-21 NOTE — Assessment & Plan Note (Signed)
Please see admission note

## 2011-08-21 NOTE — Progress Notes (Signed)
Please see admission note from the same day

## 2011-08-21 NOTE — Progress Notes (Signed)
PGY-1 Progress Note  Called by Tammy, RN that patient had increased itching following Heparin injection. She noticed some whelps on her back and arms. I came to room to assess patient. Itching somewhat relieved with cool wash cloths. Noted to have a few 1 cm raised erythematous areas on her upper back and arms. Will give her lotion to apply to area as well as Benadryl 25mg  PO x1.   Patient denies SOB, lip swelling, tongue swelling or tingling of lips/mouth/face.  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   Susan Sanders, M.D.

## 2011-08-21 NOTE — Progress Notes (Signed)
Pt reports 7/10 chest pain after receiving 1 mg morphine. MD notified. MD, Dr. Roslynn Amble on his way to assess pt. Efraim Kaufmann

## 2011-08-21 NOTE — Progress Notes (Signed)
Pt in room crying.  Pt states she "gives up."  Pt denies suicidal ideation at this time when asked.  Pt states her brother who was her best friend died in her arms a year ago.  Emotional support given to pt. Efraim Kaufmann

## 2011-08-21 NOTE — H&P (Signed)
SHANAYA SCHNECK is an 41 y.o. female.   Chief Complaint: Shortness of breath and chest pain starting 2 days ago. HPI: Shortness of breath and chest pain starting 2 days ago.  Patient notes gradual onset of intermittent substernal nonradiating squeezing exertional chest pain. It is associated with shortness of breath but not associated with palpitations or syncope. She notes that her shortness of breath seems to be worse when she goes outside. Additionally she is having a runny nose and sneezing. She took Claritin which did not help. She took Zyrtec which made her very sleepy. Additionally she notes lightheadedness but denies any vertigo or syncope or presyncope. She denies any significant past history for chest pain.  Additionally patient notes depression. Present for the last 3 or 4 months. She lost her job 3-4 months ago. She notes anhedonia and depressed thinking. Her PHQ 9 today is 19. She has a passive death wish but no active suicidal thoughts. She agrees for Engineer, manufacturing for safety.  Past Medical History  Diagnosis Date  . Scoliosis     Past Surgical History  Procedure Date  . Tubal ligation    Family history: Brother died of heart failure in his 22s Social History:  reports that she has quit smoking. She does not have any smokeless tobacco history on file. She reports that she does not drink alcohol or use illicit drugs. Recently unemployed  Allergies:  Allergies  Allergen Reactions  . Amoxicillin Itching and Swelling    Medications Prior to Admission  Medication Sig Dispense Refill  . ibuprofen (ADVIL,MOTRIN) 200 MG tablet Take 400 mg by mouth every 6 (six) hours as needed. For pain      . Menthol, Topical Analgesic, (STOPAIN) 8 % CREA Apply 1 application topically 3 (three) times daily as needed. For back pain       No current facility-administered medications on file as of 08/21/2011.     ROS reviewed please see HPI for further details.   Exam:  Blood pressure 130/86,  pulse 74, height 5\' 5"  (1.651 m), weight 152 lb (68.947 kg), last menstrual period 07/21/2011, SpO2 100.00%. Gen: Well NAD, depressed affect HEENT: EOMI,  MMM Lungs: CTABL Nl WOB Heart: RRR no MRG Abd: NABS, NT, ND Exts: Non edematous BL  LE, warm and well perfused.  Neuro: AOx3  Psych: Flat affect. Tearful. Normal speech. Thought process is slowed. Passive death wish with no active suicidal thoughts or homicidal ideation. No hallucinations or delusions.  EKG: NSR at 63 BPM normal intervals.  No ST segment elevation or depression. T waves are normal. Impression normal EKG  Assessment/Plan 41 year old woman with potential anginal chest pain and depression.  1) chest pain: EKG normal and patient has no history of chest pain or significant risk factors for coronary artery disease. However she describes central crushing nonradiating exertional chest pain. Additionally the chest pain is associated with some shortness of breath. This honestly makes me concerned for acute coronary syndrome, PE, or other serious chest etiology.  However think the most likely etiology is costochondritis or other non-serious etiology.  Plan: Rule out MI with cardiac enzymes.  Additionally we'll rule out PE with d-dimer as her Wells score is low risk.  We'll rule out other serious in her chest etiology with a chest x-ray. Plan to followup check TSH, A1c, fasting lipids. Likely will be an overnight admission. 2) depression: I am more concerned about her depression.  Her PHQ 9 is 19 which makes this severe depression.  She  has a passive death wish but no active suicidal thoughts and contracts for safety today. We'll avoid starting SSRI and while in the hospital with chest pain evaluation but would strongly recommend starting Zoloft or Celexa at discharge. Would encourage prompt followup with myself primary care doctor or other physician practice on Friday.   3) allergy symptoms: Likely seasonal allergies. May start Allegra or  Claritin as needed. 4) FEN GI: Saline lock IV and normal diet. 5) PPX: Heparin Neeses 5000 units 3 times a day. 6) disposition: Pending workup likely home in the morning.  Harriet Sutphen 08/21/2011, 10:11 AM

## 2011-08-22 LAB — HEMOGLOBIN A1C
Hgb A1c MFr Bld: 5 % (ref <5.7)
Mean Plasma Glucose: 97 mg/dL (ref <117)

## 2011-08-22 LAB — BASIC METABOLIC PANEL
CO2: 24 mEq/L (ref 19–32)
Calcium: 9.1 mg/dL (ref 8.4–10.5)
Creatinine, Ser: 0.66 mg/dL (ref 0.50–1.10)
GFR calc non Af Amer: >90 mL/min (ref >90)
Glucose, Bld: 88 mg/dL (ref 70–99)

## 2011-08-22 LAB — CBC
MCH: 27.6 pg (ref 26.0–34.0)
MCV: 83.3 fL (ref 78.0–100.0)
Platelets: 311 10*3/uL (ref 150–400)
RDW: 14.6 % (ref 11.5–15.5)
WBC: 3.9 10*3/uL — ABNORMAL LOW (ref 4.0–10.5)

## 2011-08-22 LAB — LIPID PANEL
Cholesterol: 169 mg/dL (ref 0–200)
Total CHOL/HDL Ratio: 1.8 RATIO
VLDL: 19 mg/dL (ref 0–40)

## 2011-08-22 LAB — CARDIAC PANEL(CRET KIN+CKTOT+MB+TROPI)
CK, MB: 1.2 ng/mL (ref 0.3–4.0)
Total CK: 70 U/L (ref 7–177)
Total CK: 62 U/L (ref 7–177)

## 2011-08-22 MED ORDER — SERTRALINE HCL 50 MG PO TABS: 50.0000 mg | ORAL_TABLET | Freq: Every day | ORAL | Status: DC

## 2011-08-22 NOTE — Progress Notes (Signed)
Daily Progress Note Susan Sanders. Susan Sanders, M.D., M.B.A  Family Medicine PGY-1 Pager [phone]  Subjective: Patient states that she is ready to go home; chest pain improved with breathing exercises  Welts have resolved  Objective: Vital signs in last 24 hours: Temp:  [97.9 F (36.6 C)-98.5 F (36.9 C)] 97.9 F (36.6 C) (04/10 0500) Pulse Rate:  [64-77] 66  (04/10 0500) Resp:  [16-18] 18  (04/10 0500) BP: (118-137)/(75-86) 137/75 mmHg (04/10 0500) SpO2:  [100 %] 100 % (04/10 0500) Weight:  [152 lb (68.947 kg)] 152 lb (68.947 kg) (04/09 0920) Weight change:  Last BM Date: 08/21/11  Intake/Output from previous day:   Intake/Output this shift:    Gen: awake, much less distressed and tearful than yesterday HEENT: NCAT, PERRLA, EOMI, OP clear and moist Lungs: CTA-B, normal WOB Cardiac: RRR, no murmurs rubs or gallops Abdomen: soft, NDNT, NABS Pscyh: flat affect, denies Susan/HI  Lab Results:  Basename 08/22/11 0500 08/21/11 1626  WBC 3.9* 4.3  HGB 11.4* 11.7*  HCT 34.4* 35.2*  PLT 311 308   BMET  Basename 08/22/11 0500 08/21/11 1626  NA 138 139  K 3.6 3.7  CL 104 103  CO2 24 27  GLUCOSE 88 97  BUN 12 12  CREATININE 0.66 0.80  CALCIUM 9.1 9.5   Cardiac Enzymes:  Lab 08/22/11 0031 08/21/11 1626  CKTOTAL 70 68  CKMB 1.3 1.2  CKMBINDEX -- --  TROPONINI <0.30 <0.30     Studies/Results: X-ray Chest Pa And Lateral   08/21/2011  *RADIOLOGY REPORT*  Clinical Data: Chest pain.  Dyspnea.  CHEST - 2 VIEW  Comparison: 04/03/2010.  Findings: Scoliosis. No infiltrate, congestive heart failure or pneumothorax.  Heart size within normal limits.  IMPRESSION: No acute abnormality.  Original Report Authenticated By: Fuller Canada, M.D.    Medications: I have reviewed the patient's current medications.  Assessment/Plan: 41 year old female with several month history of intermittent sharp chest pain and SOB that is being rule out for a cardiac event.   1. CV - neg trop x 2,  normal EKG, pain is non-cardiac; stop ASA  2. Psych - patient with severe depression from loss of job and death of her brother within last year; started on sertraline 50 mg daily, needs close follow-up;   3. Dispo - D/C today    LOS: 1 day   Mat Carne 08/22/2011, 9:11 AM

## 2011-08-22 NOTE — Progress Notes (Signed)
Pt discharged to home per MD order.  Pt received and reviewed all discharge instructions and medication information including follow-up appointments and prescription information.  Susan Sanders

## 2011-08-22 NOTE — Discharge Summary (Signed)
Physician Discharge Summary  Patient ID: Susan Sanders MRN: [id number] DOB/AGE: 01-28-1971 41 y.o.  Admit date: 08/21/2011 Discharge date: 08/22/2011  Admission Diagnoses:  Discharge Diagnoses:  Principal Problem:  *Chest pain Active Problems:  Anxiety and depression   Discharged Condition: stable  Hospital Course: Susan Sanders presented with sharp right sided chest pain, who was ruled out for cardiac causes. Her EKG showed normal sinus rhythm without T waves changes or ST changes. Her troponin I was < 0.30 x 3. The pain improved with morphine and breathing exercises, but was not relieved with nitroglycerin. Additionally, she had not respiratory distress and her d-dimer was normal. Thus, she was not treated for presumptive PE. The most likely origin of the problem is stress induced somatic pain. The patient displayed many criteria of major depressive disorder including anhedonia, decreased appetite, decreased sleep, persistent crying, and being withdrawn socially. This resulted from stress over losing her job a few months ago and having 3 daughters to care for. She denies suicidal ideation and denies a history of manic episodes. Therefore, she was started on Sertraline 50 mg. She will be discharged on this medication and will stay with her mother for social support.   Consults: None  Significant Diagnostic Studies:  Trop I < 0.30   Treatments: IV hydration and analgesia: acetaminophen and Morphine  Discharge Exam: Blood pressure 137/75, pulse 66, temperature 97.9 F (36.6 C), temperature source Oral, resp. rate 18, last menstrual period 07/21/2011, SpO2 100.00%. Gen: awake, much less distressed and tearful than yesterday  HEENT: NCAT, PERRLA, EOMI, OP clear and moist  Lungs: CTA-B, normal WOB  Cardiac: RRR, no murmurs rubs or gallops  Abdomen: soft, NDNT, NABS  Pscyh: flat affect, denies SI/HI   Disposition: 01-Home or Self Care  Discharge Orders    Future Appointments: Provider:  Department: Dept Phone: Center:   08/27/2011 3:15 PM Dessa Phi, MD [facility] [phone] [facility]     Medication List  As of 08/22/2011  2:37 PM   TAKE these medications         mulitivitamin with minerals Tabs   Take 1 tablet by mouth daily.      sertraline 50 MG tablet   Commonly known as: ZOLOFT   Take 1 tablet (50 mg total) by mouth daily.           Follow-up Information    Follow up with Dessa Phi, MD on 08/27/2011. (3:15 PM )    Contact information:   [address] [place] [address] [phone]          Signed: Mat Carne 08/22/2011, 2:37 PM

## 2011-08-22 NOTE — Discharge Instructions (Signed)
Mrs. Susan Sanders, Susan Sanders were hospitalized for chest pain. Thankfully, there is no evidence that it caused any damage to your heart. Your heart and lungs are perfectly healthy at this time. We talked about this pain being caused by stress. You have certainly had more than your share of stress with your brother passing away and your job loss. I have started you on a daily medication called Sertraline that will help reduce your stress over time. I think you will tolerate this medication well and that it will help. If by some chance you take this medication and it makes you more depressed or changes your behavior significantly, then stop it immediately. This is not likely to happen though. Please practice your breathing exercises when you have pain. This will help as well.   Follow-up in the clinic on Monday so Dr Armen Pickup can talk to you about your new medication and chest pain.   Sincerely,   Dr. [name]

## 2011-08-22 NOTE — Progress Notes (Signed)
FMTS Attending Note  Patient seen and examined by me, she has no further chest pain except with palpation of lower segment of sternum.  Flat affect, admits to depression but denies SI. Agree with Dr Yetta Numbers plan for discharge to home, with close follow up now on SSRI for depression.   Paula Compton, MD

## 2011-08-27 ENCOUNTER — Inpatient Hospital Stay: Payer: Medicaid Other | Admitting: Family Medicine

## 2011-09-04 ENCOUNTER — Telehealth: Payer: Self-pay | Admitting: Family Medicine

## 2011-09-04 NOTE — Telephone Encounter (Signed)
Patient is calling because she wants to know if she has had a TB test recently and if Medicaid covers it.

## 2011-09-04 NOTE — Telephone Encounter (Signed)
Called and informed pt that this should be covered by medicaid although I cannot be 100% sure. Pt scheduled appt for nurse visit to have ppd done.Susan Sanders I

## 2011-09-05 ENCOUNTER — Ambulatory Visit (INDEPENDENT_AMBULATORY_CARE_PROVIDER_SITE_OTHER): Payer: Medicaid Other | Admitting: *Deleted

## 2011-09-05 DIAGNOSIS — Z111 Encounter for screening for respiratory tuberculosis: Secondary | ICD-10-CM

## 2011-09-07 ENCOUNTER — Ambulatory Visit (INDEPENDENT_AMBULATORY_CARE_PROVIDER_SITE_OTHER): Payer: Medicaid Other | Admitting: *Deleted

## 2011-09-07 DIAGNOSIS — Z111 Encounter for screening for respiratory tuberculosis: Secondary | ICD-10-CM

## 2011-09-07 DIAGNOSIS — IMO0001 Reserved for inherently not codable concepts without codable children: Secondary | ICD-10-CM

## 2011-09-07 LAB — TB SKIN TEST
Induration: 0
TB Skin Test: NEGATIVE mm

## 2011-09-23 ENCOUNTER — Emergency Department (HOSPITAL_COMMUNITY)
Admission: EM | Admit: 2011-09-23 | Discharge: 2011-09-23 | Disposition: A | Discharge status: Home / Self Care | Payer: Medicaid Other | Attending: Emergency Medicine | Admitting: Emergency Medicine

## 2011-09-23 ENCOUNTER — Emergency Department (HOSPITAL_COMMUNITY): Payer: Medicaid Other

## 2011-09-23 ENCOUNTER — Encounter (HOSPITAL_COMMUNITY): Payer: Self-pay | Admitting: *Deleted

## 2011-09-23 DIAGNOSIS — M25559 Pain in unspecified hip: Secondary | ICD-10-CM | POA: Insufficient documentation

## 2011-09-23 DIAGNOSIS — Z87891 Personal history of nicotine dependence: Secondary | ICD-10-CM | POA: Insufficient documentation

## 2011-09-23 DIAGNOSIS — M545 Low back pain, unspecified: Secondary | ICD-10-CM | POA: Insufficient documentation

## 2011-09-23 DIAGNOSIS — M25569 Pain in unspecified knee: Secondary | ICD-10-CM | POA: Insufficient documentation

## 2011-09-23 DIAGNOSIS — W1789XA Other fall from one level to another, initial encounter: Secondary | ICD-10-CM | POA: Insufficient documentation

## 2011-09-23 DIAGNOSIS — T148XXA Other injury of unspecified body region, initial encounter: Secondary | ICD-10-CM | POA: Insufficient documentation

## 2011-09-23 DIAGNOSIS — M4120 Other idiopathic scoliosis, site unspecified: Secondary | ICD-10-CM | POA: Insufficient documentation

## 2011-09-23 MED ORDER — METHOCARBAMOL 750 MG PO TABS: 750.0000 mg | ORAL_TABLET | Freq: Four times a day (QID) | ORAL | Status: AC

## 2011-09-23 MED ORDER — OXYCODONE-ACETAMINOPHEN 5-325 MG PO TABS
2.0000 | ORAL_TABLET | Freq: Once | ORAL | Status: AC
  Administered 2011-09-23: 2 via ORAL
  Filled 2011-09-23: qty 2

## 2011-09-23 MED ORDER — OXYCODONE-ACETAMINOPHEN 5-325 MG PO TABS: 2.0000 | ORAL_TABLET | ORAL | Status: AC | PRN

## 2011-09-23 NOTE — ED Notes (Signed)
Reports having a fall last night and now having lower back pain, and pain to left leg, especially to left knee.

## 2011-09-23 NOTE — Discharge Instructions (Signed)
Muscle Strain A muscle strain, or pulled muscle, occurs when a muscle is over-stretched. A small number of muscle fibers may also be torn. This is especially common in athletes. This happens when a sudden violent force placed on a muscle pushes it past its capacity. Usually, recovery from a pulled muscle takes 1 to 2 weeks. But complete healing will take 5 to 6 weeks. There are millions of muscle fibers. Following injury, your body will usually return to normal quickly. HOME CARE INSTRUCTIONS   While awake, apply ice to the sore muscle for 15 to 20 minutes each hour for the first 2 days. Put ice in a plastic bag and place a towel between the bag of ice and your skin.   Do not use the pulled muscle for several days. Do not use the muscle if you have pain.   You may wrap the injured area with an elastic bandage for comfort. Be careful not to bind it too tightly. This may interfere with blood circulation.   Only take over-the-counter or prescription medicines for pain, discomfort, or fever as directed by your caregiver. Do not use aspirin as this will increase bleeding (bruising) at injury site.   Warming up before exercise helps prevent muscle strains.  SEEK MEDICAL CARE IF:  There is increased pain or swelling in the affected area. MAKE SURE YOU:   Understand these instructions.   Will watch your condition.   Will get help right away if you are not doing well or get worse.  Document Released: 04/30/2005 Document Revised: 04/19/2011 Document Reviewed: 11/27/2006 [employer] Patient Information 2012 [employer]. 

## 2011-09-23 NOTE — ED Provider Notes (Signed)
History     CSN: [id number]  Arrival date & time 09/23/11  0846   First MD Initiated Contact with Patient 09/23/11 [phone]      Chief Complaint  Patient presents with  . Fall  . Leg Pain    (Consider location/radiation/quality/duration/timing/severity/associated sxs/prior treatment) Patient is a 41 y.o. female presenting with fall and leg pain. The history is provided by the patient.  Fall The fall occurred while standing. She fell from a height of 1 to 2 ft. She landed on carpet. The point of impact was the left hip and left knee. The pain is present in the left knee and left hip. The pain is moderate. She was ambulatory at the scene. There was no drug use involved in the accident. There was alcohol use involved in the accident. Pertinent negatives include no visual change, no numbness, no bowel incontinence and no tingling. The symptoms are aggravated by activity. She has tried nothing for the symptoms.  Leg Pain  Pertinent negatives include no numbness and no tingling.  pt also c/o lower back pain  Past Medical History  Diagnosis Date  . Scoliosis   . Complication of anesthesia     difficult waking up "  . Chest pain   . Depression     Past Surgical History  Procedure Date  . Tubal ligation     History reviewed. No pertinent family history.  History  Substance Use Topics  . Smoking status: Former Smoker -- 14 years    Quit date: 09/20/2010  . Smokeless tobacco: Never Used  . Alcohol Use: No    OB History    Grav Para Term Preterm Abortions TAB SAB Ect Mult Living                  Review of Systems  Gastrointestinal: Negative for bowel incontinence.  Neurological: Negative for tingling and numbness.  All other systems reviewed and are negative.    Allergies  Amoxicillin  Home Medications   Current Outpatient Rx  Name Route Sig Dispense Refill  . ADULT MULTIVITAMIN W/MINERALS CH Oral Take 1 tablet by mouth daily.    . SERTRALINE HCL 50 MG PO TABS Oral  Take 1 tablet (50 mg total) by mouth daily. 30 tablet 5    BP 130/76  Pulse 90  Temp(Src) 98.4 F (36.9 C) (Oral)  Resp 20  SpO2 96%  Physical Exam  Nursing note and vitals reviewed. Constitutional: She is oriented to person, place, and time. Vital signs are normal. She appears well-developed and well-nourished.  Non-toxic appearance. No distress.  HENT:  Head: Normocephalic and atraumatic.  Eyes: Conjunctivae, EOM and lids are normal. Pupils are equal, round, and reactive to light.  Neck: Normal range of motion. Neck supple. No tracheal deviation present. No mass present.  Cardiovascular: Normal rate, regular rhythm and normal heart sounds.  Exam reveals no gallop.   No murmur heard. Pulmonary/Chest: Effort normal and breath sounds normal. No stridor. No respiratory distress. She has no decreased breath sounds. She has no wheezes. She has no rhonchi. She has no rales.  Abdominal: Soft. Normal appearance and bowel sounds are normal. She exhibits no distension. There is no tenderness. There is no rebound and no CVA tenderness.  Musculoskeletal: Normal range of motion. She exhibits no edema and no tenderness.       Back:       Left knee with FROM, no joint effusion, neurovasc intact distally, left hip without rotation or shortening--pain with flexion/extension  Neurological: She is alert and oriented to person, place, and time. She has normal strength. No cranial nerve deficit or sensory deficit. GCS eye subscore is 4. GCS verbal subscore is 5. GCS motor subscore is 6.  Skin: Skin is warm and dry. No abrasion and no rash noted.  Psychiatric: She has a normal mood and affect. Her speech is normal and behavior is normal.    ED Course  Procedures (including critical care time)  Labs Reviewed - No data to display No results found.   No diagnosis found.    MDM  Recent given pain medications here. X-ray results noted negative for acute injury. She'll be given scripts for pain  medications and muscle relaxants        Toy Baker, MD 09/23/11 1037

## 2011-10-12 ENCOUNTER — Encounter: Payer: Self-pay | Admitting: *Deleted

## 2011-10-12 ENCOUNTER — Encounter: Payer: Self-pay | Admitting: Family Medicine

## 2011-10-12 ENCOUNTER — Ambulatory Visit (INDEPENDENT_AMBULATORY_CARE_PROVIDER_SITE_OTHER): Payer: Medicaid Other | Admitting: Family Medicine

## 2011-10-12 VITALS — BP 156/88 | HR 78 | Temp 98.1°F | Ht 65.0 in | Wt 150.3 lb

## 2011-10-12 DIAGNOSIS — M549 Dorsalgia, unspecified: Secondary | ICD-10-CM

## 2011-10-12 DIAGNOSIS — M62830 Muscle spasm of back: Secondary | ICD-10-CM

## 2011-10-12 DIAGNOSIS — M5380 Other specified dorsopathies, site unspecified: Secondary | ICD-10-CM

## 2011-10-12 LAB — POCT URINALYSIS DIPSTICK
Bilirubin, UA: NEGATIVE
Glucose, UA: NEGATIVE
Ketones, UA: NEGATIVE
Leukocytes, UA: NEGATIVE
Nitrite, UA: NEGATIVE
Protein, UA: NEGATIVE
Spec Grav, UA: 1.025
Urobilinogen, UA: 0.2
pH, UA: 5.5

## 2011-10-12 MED ORDER — DICLOFENAC SODIUM 75 MG PO TBEC: 75.0000 mg | DELAYED_RELEASE_TABLET | Freq: Two times a day (BID) | ORAL | Status: DC

## 2011-10-12 NOTE — Progress Notes (Signed)
Addended by: [place], Renita Brocks on: 10/12/2011 11:40 AM   Modules accepted: Orders

## 2011-10-12 NOTE — Assessment & Plan Note (Addendum)
Related to a fall 2 weeks ago. Advised heat. Advised diclofenac BID.  Advised back exercises to stretch this out Rest.  F/u in one week.

## 2011-10-12 NOTE — Progress Notes (Signed)
  Subjective:   Patient ID: Susan Sanders, female DOB: [birth year] 41 y.o. MRN: [id number] HPI:  1. Lower back pain Synopsis: patient fell down a hill and landed on her buttocks 2 weeks ago. Was eval. In the ED. Negative films, reviewed again today. She has since had unchanged pain that is in her lumbar paraspinal areas bilaterally. Does have some shooting pain down her right leg.  Location: bilateral lower back paraspinal muscles.  Onset: has been acute and unchanged  Time period of: 2 week(s).  Severity is described as severe.  Aggravating: certain positions, movement Alleviating: ibuprofen, flexeril, percocet Associated sx/sn: no falls, has some emesis.   History  Substance Use Topics  . Smoking status: Former Smoker -- 14 years    Quit date: 09/20/2010  . Smokeless tobacco: Never Used  . Alcohol Use: No    Review of Systems: Pertinent items are noted in HPI.  Labs Reviewed: yes. Xrays.  Reviewed Chart Review for last notes.     Objective:   Filed Vitals:   10/12/11 0938  BP: 156/88  Pulse: 78  Temp: 98.1 F (36.7 C)  TempSrc: Oral  Height: 5\' 5"  (1.651 m)  Weight: 150 lb 4.8 oz (68.176 kg)   Physical Exam: General: aaf, in discomfort, pleasant Back: alignment (scoliosis) No vertebral ttp. Straight leg raise negative bilaterally. faber's negative. Muscle spasm noted in both lumbar paraspinal muscles with TTP.  No other joint involved. No rash.  Assessment & Plan:

## 2011-10-12 NOTE — Patient Instructions (Signed)
Back Exercises Back exercises help treat and prevent back injuries. The goal is to increase your strength in your belly (abdominal) and back muscles. These exercises can also help with flexibility. Start these exercises when told by your doctor. HOME CARE Back exercises include: Pelvic Tilt.  Lie on your back with your knees bent. Tilt your pelvis until the lower part of your back is against the floor. Hold this position 5 to 10 sec. Repeat this exercise 5 to 10 times.  Knee to Chest.  Pull 1 knee up against your chest and hold for 20 to 30 seconds. Repeat this with the other knee. This may be done with the other leg straight or bent, whichever feels better. Then, pull both knees up against your chest.  Sit-Ups or Curl-Ups.  Bend your knees 90 degrees. Start with tilting your pelvis, and do a partial, slow sit-up. Only lift your upper half 30 to 45 degrees off the floor. Take at least 2 to 3 seonds for each sit-up. Do not do sit-ups with your knees out straight. If partial sit-ups are difficult, simply do the above but with only tightening your belly (abdominal) muscles and holding it as told.  Hip-Lift.  Lie on your back with your knees flexed 90 degrees. Push down with your feet and shoulders as you raise your hips 2 inches off the floor. Hold for 10 seconds, repeat 5 to 10 times.  Back Arches.  Lie on your stomach. Prop yourself up on bent elbows. Slowly press on your hands, causing an arch in your low back. Repeat 3 to 5 times.  Shoulder-Lifts.  Lie face down with arms beside your body. Keep hips and belly pressed to floor as you slowly lift your head and shoulders off the floor.  Do not overdo your exercises. Be careful in the beginning. Exercises may cause you some mild back discomfort. If the pain lasts for more than 15 minutes, stop the exercises until you see your doctor. Improvement with exercise for back problems is slow.  Document Released: 06/02/2010 Document Revised: 04/19/2011  Document Reviewed: 06/02/2010 [employer] Patient Information 2012 [employer]. 

## 2011-10-24 ENCOUNTER — Ambulatory Visit: Payer: Medicaid Other

## 2011-11-11 ENCOUNTER — Encounter (HOSPITAL_COMMUNITY): Payer: Self-pay | Admitting: Emergency Medicine

## 2011-11-11 ENCOUNTER — Emergency Department (HOSPITAL_COMMUNITY)
Admission: EM | Admit: 2011-11-11 | Discharge: 2011-11-11 | Disposition: A | Discharge status: Home / Self Care | Payer: Medicaid Other | Source: Home / Self Care | Attending: Emergency Medicine | Admitting: Emergency Medicine

## 2011-11-11 ENCOUNTER — Emergency Department (HOSPITAL_COMMUNITY)
Admission: EM | Admit: 2011-11-11 | Discharge: 2011-11-11 | Disposition: A | Discharge status: Home / Self Care | Payer: Medicaid Other | Attending: Emergency Medicine | Admitting: Emergency Medicine

## 2011-11-11 ENCOUNTER — Other Ambulatory Visit: Payer: Self-pay

## 2011-11-11 DIAGNOSIS — Z79899 Other long term (current) drug therapy: Secondary | ICD-10-CM | POA: Insufficient documentation

## 2011-11-11 DIAGNOSIS — M62830 Muscle spasm of back: Secondary | ICD-10-CM

## 2011-11-11 DIAGNOSIS — M4120 Other idiopathic scoliosis, site unspecified: Secondary | ICD-10-CM | POA: Insufficient documentation

## 2011-11-11 DIAGNOSIS — B353 Tinea pedis: Secondary | ICD-10-CM | POA: Insufficient documentation

## 2011-11-11 DIAGNOSIS — G8929 Other chronic pain: Secondary | ICD-10-CM | POA: Insufficient documentation

## 2011-11-11 DIAGNOSIS — R55 Syncope and collapse: Secondary | ICD-10-CM | POA: Insufficient documentation

## 2011-11-11 DIAGNOSIS — M549 Dorsalgia, unspecified: Secondary | ICD-10-CM | POA: Insufficient documentation

## 2011-11-11 DIAGNOSIS — Z87891 Personal history of nicotine dependence: Secondary | ICD-10-CM | POA: Insufficient documentation

## 2011-11-11 DIAGNOSIS — M5380 Other specified dorsopathies, site unspecified: Secondary | ICD-10-CM | POA: Insufficient documentation

## 2011-11-11 LAB — POCT I-STAT, CHEM 8
Chloride: 110 mEq/L (ref 96–112)
HCT: 38 % (ref 36.0–46.0)
Potassium: 4.4 mEq/L (ref 3.5–5.1)
Sodium: 140 mEq/L (ref 135–145)

## 2011-11-11 LAB — PREGNANCY, URINE: Preg Test, Ur: NEGATIVE

## 2011-11-11 MED ORDER — DIAZEPAM 5 MG PO TABS: 5.0000 mg | ORAL_TABLET | Freq: Three times a day (TID) | ORAL | Status: AC | PRN

## 2011-11-11 MED ORDER — HYDROCODONE-ACETAMINOPHEN 5-325 MG PO TABS
1.0000 | ORAL_TABLET | Freq: Once | ORAL | Status: AC
  Administered 2011-11-11: 1 via ORAL
  Filled 2011-11-11: qty 1

## 2011-11-11 MED ORDER — DIAZEPAM 5 MG PO TABS
10.0000 mg | ORAL_TABLET | Freq: Once | ORAL | Status: AC
  Administered 2011-11-11: 10 mg via ORAL
  Filled 2011-11-11: qty 2

## 2011-11-11 MED ORDER — HYDROCODONE-ACETAMINOPHEN 5-325 MG PO TABS: ORAL_TABLET | ORAL | Status: AC

## 2011-11-11 MED ORDER — KETOROLAC TROMETHAMINE 30 MG/ML IJ SOLN
30.0000 mg | Freq: Once | INTRAMUSCULAR | Status: AC
Start: 2011-11-11 — End: 2011-11-11
  Administered 2011-11-11: 30 mg via INTRAMUSCULAR
  Filled 2011-11-11: qty 1

## 2011-11-11 NOTE — ED Provider Notes (Signed)
History     CSN: [id number]  Arrival date & time 11/11/11  1752   First MD Initiated Contact with Patient 11/11/11 1953      Chief Complaint  Patient presents with  . Near Syncope    (Consider location/radiation/quality/duration/timing/severity/associated sxs/prior treatment) HPI Pt presents with c/o syncope.  She was in the ED earlier today for treatment of flare up of her chronic back pain.  She was treated with norco, valium in ED- she states that when she went home she was in the bathroom- felt warm and flushed, then passed out.  Now feels somewhat drowsy.  Denies chest pain or palpitations.  No vomiting or diarrhea.  No fever/chills.  No weakness of legs or urinary incontinence.  She denies neck pain, and denies worsening of her back pain. There are no other associated systemic symptoms, there are no other alleviating or modifying factors.    Past Medical History  Diagnosis Date  . Scoliosis   . Complication of anesthesia     difficult waking up "  . Chest pain   . Depression   . Chronic back pain greater than 3 months duration     Past Surgical History  Procedure Date  . Tubal ligation     History reviewed. No pertinent family history.  History  Substance Use Topics  . Smoking status: Former Smoker -- 14 years    Quit date: 09/20/2010  . Smokeless tobacco: Never Used  . Alcohol Use: No    OB History    Grav Para Term Preterm Abortions TAB SAB Ect Mult Living                  Review of Systems ROS reviewed and all otherwise negative except for mentioned in HPI  Allergies  Amoxicillin  Home Medications   Current Outpatient Rx  Name Route Sig Dispense Refill  . ACETAMINOPHEN 500 MG PO TABS Oral Take 1,000 mg by mouth every 6 (six) hours as needed. For pain    . ADULT MULTIVITAMIN W/MINERALS CH Oral Take 1 tablet by mouth daily.    . SERTRALINE HCL 50 MG PO TABS Oral Take 50 mg by mouth daily.    Marland Kitchen DIAZEPAM 5 MG PO TABS Oral Take 1 tablet (5 mg total)  by mouth every 8 (eight) hours as needed (muscle spasms). 15 tablet 0  . HYDROCODONE-ACETAMINOPHEN 5-325 MG PO TABS  1-2 tablets po q 6 hours prn moderate to severe pain 20 tablet 0    BP 146/71  Pulse 62  Temp 98.3 F (36.8 C) (Oral)  Resp 16  SpO2 99%  LMP 11/11/2011 Vitals reviewed Physical Exam Physical Examination: General appearance - alert, well appearing, and in no distress Mental status - alert, oriented to person, place, and time Eyes - pupils equal and reactive, extraocular eye movements intact Mouth - mucous membranes moist, pharynx normal without lesions Chest - clear to auscultation, no wheezes, rales or rhonchi, symmetric air entry Heart - normal rate, regular rhythm, normal S1, S2, no murmurs, rubs, clicks or gallops Abdomen - soft, nontender, nondistended, no masses or organomegaly Neurological - alert, oriented, normal speech, cranial nerves grossly intact, strength 5/5 in extremities x 4, sensation intact Musculoskeletal - no joint tenderness, deformity or swelling Extremities - peripheral pulses normal, no pedal edema, no clubbing or cyanosis Skin - normal coloration and turgor, no rashes Psych- flat affect  ED Course  Procedures (including critical care time)   Date: 11/11/2011  Rate: 74  Rhythm: normal  sinus rhythm with sinus arrythmia  QRS Axis: normal  Intervals: normal  ST/T Wave abnormalities: normal  Conduction Disutrbances:none  Narrative Interpretation:   Old EKG Reviewed: none available    Labs Reviewed  PREGNANCY, URINE  POCT I-STAT, CHEM 8  LAB REPORT - SCANNED   No results found.   1. Syncope       MDM  Pt presenting after syncopal event at home.  She was in the ED earlier and treated with pain meds for back pain- presumably medication side effect, or vagal event.  Pt with normal ekg, not anemic.  Low suspicion for any other acute emergent condition that would preclude discharge at this time.  Pt discharged with strict return  precuations, she is agreeable with this plan.         Ethelda Chick, MD 11/12/11 [phone]

## 2011-11-11 NOTE — ED Notes (Signed)
Pt for discharge.Vital signs are stable.GCS 15.Pt handed over to nurse first to watch her till her friend picks her up .
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
## 2011-11-11 NOTE — ED Notes (Signed)
Pt presents to department for evaluation of lower back pain, states she has chronic issues with this. Denies urinary symptoms. States pain started yesterday after picking up small child. Also states rash between toes on L foot, pt states no relief with creams at home. 8/10 pain at the time. She is alert and oriented x4. No signs of acute distress.

## 2011-11-11 NOTE — ED Notes (Signed)
Pt presented to ED with rt shoulder pain.She was in ED earlier this afternoon with same complaint.She also complains of few runs of diarrhea durning the day.

## 2011-11-11 NOTE — ED Notes (Addendum)
Pt reports last remembers talking to daughter and went to bathroom. Pt unsure of what else happened. Pt was seen here earlier today for another complaint and discharged. Pt denies taking her recent prescribed medications when she got home. Pt groggy at present. Pt reports "Ever since I was given that medicine, I have been pooping a lot." pt reports having diarrhea.

## 2011-11-11 NOTE — Discharge Instructions (Signed)
Athlete's Foot Athlete's foot (tinea pedis) is a fungal infection of the skin on the feet. It often occurs on the skin between the toes or underneath the toes. It can also occur on the soles of the feet. Athlete's foot is more likely to occur in hot, humid weather. Not washing your feet or changing your socks often enough can contribute to athlete's foot. The infection can spread from person to person (contagious). CAUSES Athlete's foot is caused by a fungus. This fungus thrives in warm, moist places. Most people get athlete's foot by sharing shower stalls, towels, and wet floors with an infected person. People with weakened immune systems, including those with diabetes, may be more likely to get athlete's foot. SYMPTOMS   Itchy areas between the toes or on the soles of the feet.   White, flaky, or scaly areas between the toes or on the soles of the feet.   Tiny, intensely itchy blisters between the toes or on the soles of the feet.   Tiny cuts on the skin. These cuts can develop a bacterial infection.   Thick or discolored toenails.  DIAGNOSIS  Your caregiver can usually tell what the problem is by doing a physical exam. Your caregiver may also take a skin sample from the rash area. The skin sample may be examined under a microscope, or it may be tested to see if fungus will grow in the sample. A sample may also be taken from your toenail for testing. TREATMENT  Over-the-counter and prescription medicines can be used to kill the fungus. These medicines are available as powders or creams. Your caregiver can suggest medicines for you. Fungal infections respond slowly to treatment. You may need to continue using your medicine for several weeks. PREVENTION   Do not share towels.   Wear sandals in wet areas, such as shared locker rooms and shared showers.   Keep your feet dry. Wear shoes that allow air to circulate. Wear cotton or wool socks.  HOME CARE INSTRUCTIONS   Take medicines as  directed by your caregiver. Do not use steroid creams on athlete's foot.   Keep your feet clean and cool. Wash your feet daily and dry them thoroughly, especially between your toes.   Change your socks every day. Wear cotton or wool socks. In hot climates, you may need to change your socks 2 to 3 times per day.   Wear sandals or canvas tennis shoes with good air circulation.   If you have blisters, soak your feet in Burow's solution or Epsom salts for 20 to 30 minutes, 2 times a day to dry out the blisters. Make sure you dry your feet thoroughly afterward.  SEEK MEDICAL CARE IF:   You have a fever.   You have swelling, soreness, warmth, or redness in your foot.   You are not getting better after 7 days of treatment.   You are not completely cured after 30 days.   You have any problems caused by your medicines.  MAKE SURE YOU:   Understand these instructions.   Will watch your condition.   Will get help right away if you are not doing well or get worse.  Document Released: 04/27/2000 Document Revised: 04/19/2011 Document Reviewed: 02/16/2011 [facility] Patient Information 2012 [place], [place].     Lumbosacral Strain Lumbosacral strain is one of the most common causes of back pain. There are many causes of back pain. Most are not serious conditions. CAUSES  Your backbone (spinal column) is made up of  24 main vertebral bodies, the sacrum, and the coccyx. These are held together by muscles and tough, fibrous tissue (ligaments). Nerve roots pass through the openings between the vertebrae. A sudden move or injury to the back may cause injury to, or pressure on, these nerves. This may result in localized back pain or pain movement (radiation) into the buttocks, down the leg, and into the foot. Sharp, shooting pain from the buttock down the back of the leg (sciatica) is frequently associated with a ruptured (herniated) disk. Pain may be caused by muscle spasm alone. Your caregiver can  often find the cause of your pain by the details of your symptoms and an exam. In some cases, you may need tests (such as X-rays). Your caregiver will work with you to decide if any tests are needed based on your specific exam. HOME CARE INSTRUCTIONS   Avoid an underactive lifestyle. Active exercise, as directed by your caregiver, is your greatest weapon against back pain.   Avoid hard physical activities (tennis, racquetball, waterskiing) if you are not in proper physical condition for it. This may aggravate or create problems.   If you have a back problem, avoid sports requiring sudden body movements. Swimming and walking are generally safer activities.   Maintain good posture.   Avoid becoming overweight (obese).   Use bed rest for only the most extreme, sudden (acute) episode. Your caregiver will help you determine how much bed rest is necessary.   For acute conditions, you may put ice on the injured area.   Put ice in a plastic bag.   Place a towel between your skin and the bag.   Leave the ice on for 15 to 20 minutes at a time, every 2 hours, or as needed.   After you are improved and more active, it may help to apply heat for 30 minutes before activities.  See your caregiver if you are having pain that lasts longer than expected. Your caregiver can advise appropriate exercises or therapy if needed. With conditioning, most back problems can be avoided. SEEK IMMEDIATE MEDICAL CARE IF:   You have numbness, tingling, weakness, or problems with the use of your arms or legs.   You experience severe back pain not relieved with medicines.   There is a change in bowel or bladder control.   You have increasing pain in any area of the body, including your belly (abdomen).   You notice shortness of breath, dizziness, or feel faint.   You feel sick to your stomach (nauseous), are throwing up (vomiting), or become sweaty.   You notice discoloration of your toes or legs, or your feet  get very cold.   Your back pain is getting worse.   You have a fever.  MAKE SURE YOU:   Understand these instructions.   Will watch your condition.   Will get help right away if you are not doing well or get worse.  Document Released: 02/07/2005 Document Revised: 04/19/2011 Document Reviewed: 07/30/2008 [facility] Patient Information 2012 [place], [place].    Narcotic and benzodiazepine use may cause drowsiness, slowed breathing or dependence.  Please use with caution and do not drive, operate machinery or watch young children alone while taking them.  Taking combinations of these medications or drinking alcohol will potentiate these effects.

## 2011-11-11 NOTE — Discharge Instructions (Signed)
Return to the ED with any concerns including chest pain, shortness of breath, recurrent fainting, decreased level of alertness/lethargy, or any other alarming symptoms

## 2011-11-11 NOTE — ED Notes (Signed)
Pt reports fell in May and continues to have pain in low pain. Pt also reports rash in between toes on left foot. Pt reports back pain radiates upward in wave like motion. Pt has used heating pad, ice pack and back exercises without relief. Pt has tried over the counter foot powder between toes without relief.

## 2011-11-11 NOTE — ED Notes (Signed)
Pt for discharge.Vital signs stable.GCS 15.Pt says that she is feeling better.

## 2011-11-11 NOTE — ED Notes (Signed)
Pts daughter updates that pt fell and fit her head(back) and was unconscious for .

## 2011-11-12 ENCOUNTER — Other Ambulatory Visit: Payer: Self-pay | Admitting: Family Medicine

## 2011-11-12 DIAGNOSIS — M549 Dorsalgia, unspecified: Secondary | ICD-10-CM

## 2011-11-12 NOTE — Progress Notes (Signed)
Formal PT referral due to recent ED visit with only mild relief of back pain.

## 2011-11-14 ENCOUNTER — Ambulatory Visit (HOSPITAL_COMMUNITY)
Admission: RE | Admit: 2011-11-14 | Discharge: 2011-11-14 | Disposition: A | Discharge status: Home / Self Care | Payer: Medicaid Other | Source: Ambulatory Visit | Attending: Family Medicine | Admitting: Family Medicine

## 2011-11-14 ENCOUNTER — Other Ambulatory Visit: Payer: Self-pay

## 2011-11-14 ENCOUNTER — Ambulatory Visit (INDEPENDENT_AMBULATORY_CARE_PROVIDER_SITE_OTHER): Payer: Medicaid Other | Admitting: Family Medicine

## 2011-11-14 ENCOUNTER — Telehealth: Payer: Self-pay | Admitting: *Deleted

## 2011-11-14 ENCOUNTER — Ambulatory Visit: Payer: Medicaid Other | Admitting: Family Medicine

## 2011-11-14 ENCOUNTER — Encounter: Payer: Self-pay | Admitting: Family Medicine

## 2011-11-14 VITALS — BP 132/87 | HR 74 | Ht 65.0 in | Wt 157.0 lb

## 2011-11-14 DIAGNOSIS — R109 Unspecified abdominal pain: Secondary | ICD-10-CM | POA: Insufficient documentation

## 2011-11-14 DIAGNOSIS — R079 Chest pain, unspecified: Secondary | ICD-10-CM | POA: Insufficient documentation

## 2011-11-14 DIAGNOSIS — R10A1 Flank pain, right side: Secondary | ICD-10-CM | POA: Insufficient documentation

## 2011-11-14 LAB — POCT URINALYSIS DIPSTICK
Bilirubin, UA: NEGATIVE
Glucose, UA: NEGATIVE
Leukocytes, UA: NEGATIVE
Nitrite, UA: NEGATIVE
pH, UA: 6

## 2011-11-14 MED ORDER — OMEPRAZOLE 20 MG PO CPDR: 20.0000 mg | DELAYED_RELEASE_CAPSULE | Freq: Every day | ORAL | Status: DC

## 2011-11-14 NOTE — Telephone Encounter (Signed)
LMOM informing patient that she needs to come back in for labs that were to be done at the 7/3 visit

## 2011-11-14 NOTE — Addendum Note (Signed)
Addended by: Farrell Ours on: 11/14/2011 04:25 PM   Modules accepted: Orders

## 2011-11-14 NOTE — Assessment & Plan Note (Signed)
Associate with right upper quadrant and epigastric pain and right costophrenic pain. Drawn differential includes biliary, have had a, urologic on. Cardiac seems very low likelihood essentially was negative EKG.  low likelihood thrombo-embolic etiology with no tachycardia, respiratory distress or risk factors. Will check CMET, CBC, repeat urinalysis-noted hemoglobin positive in the ED to rule out nephrolithiasis. Advised patient to followup at emergency department for worsening of symptoms, fever, dysuria, emesis. Pending results may pursue imaging if symptoms not improved in the next few days.

## 2011-11-14 NOTE — Progress Notes (Signed)
  Subjective:    Patient ID: Susan Sanders, female    DOB: [birth year], 41 y.o.   MRN: [id number]  HPI ED follow up 6/29  1. Right side/back/UQ pain. Patient began having right-sided back pain radiating around her right flank to right upper quadrant and right chest on Saturday. This was associated with some nausea and one episode of emesis with a loose stool. She additionally had a presyncopal event and was evaluated at the emergency department twice on 11/10/2011. At this time she was diagnosed with a musculoskeletal strain and treated with Norco. Had a normal EKG and basic labs. Feels like she has some dyspnea at times as well.  She continues to feel "woozy," having indigestion, nausea, irritation in her throat and poor appetite. Patient states her pain is slightly improved since initial evaluation.    She denies any emesis, diarrhea, blood in stool, fever, dysuria, hematuria, exertional chest pain, asymmetric leg swelling, palpitations.  Surgical history: BTL.   Review of Systems See HPI otherwise negative.  reports that she quit smoking about 13 months ago. She has never used smokeless tobacco.    Objective:   Physical Exam  Vitals reviewed. Constitutional: She is oriented to person, place, and time. She appears well-developed and well-nourished. No distress.       Appears fatigued, uncomfortable.  HENT:  Head: Normocephalic and atraumatic.  Eyes: EOM are normal. Pupils are equal, round, and reactive to light.  Cardiovascular: Normal rate, regular rhythm, normal heart sounds and intact distal pulses.   No murmur heard. Pulmonary/Chest: Effort normal and breath sounds normal. No respiratory distress. She has no wheezes. She has no rales.       Mild TTP on right sternal border and costophrenic margin.  Abdominal: Soft. Bowel sounds are normal. She exhibits no distension. There is tenderness. There is no rebound and no guarding.       Mild RUQ TTP. Mild epigastric TTP. No rebound, guarding, no  CVA tenderness.   Musculoskeletal: She exhibits no edema and no tenderness.  Neurological: She is alert and oriented to person, place, and time. Coordination normal.  Skin: No rash noted.  Psychiatric: She has a normal mood and affect.    ECG REPORT   Date: 11/14/2011  EKG Time: 2:36 PM  Rate: 67  Rhythm: normal sinus rhythm,  normal EKG, normal sinus rhythm, unchanged from previous tracings  Axis: normal  Intervals:none  ST&T Change: none  Narrative Interpretation: normal         Assessment & Plan:

## 2011-11-14 NOTE — Assessment & Plan Note (Signed)
Nonspecific pain in a benign abdomen, centered in right upper quadrant epigastrium, slightly improved since Saturday. Will check lab tests as above, treat as presumed gastritis with her indigestion and start PPI. Check H pylori. If not improved consider imaging as indicated.

## 2011-11-24 ENCOUNTER — Telehealth: Payer: Self-pay | Admitting: Family Medicine

## 2011-11-24 NOTE — Telephone Encounter (Signed)
Patient reacting to medication.  She presented to ED 06/30 with back pain diagnosed as lumbar muscle spasm.  She was advised to take NSAID and given Rx for Valium and "analgesics" (ED note does not specify). Since taking the medications, the patient has been "itching all over" scratching so hard she is "down to the meat". She cannot specify which medication she is taking that is causing this reaction.   I gave her the option of going to UC/ED to be evaluated right away. Or, if it is not urgent, then she can stop the medication and try Benadryl and make an appointment to be seen at the clinic on Monday. Patient opted for the second option.

## 2011-11-29 ENCOUNTER — Ambulatory Visit (INDEPENDENT_AMBULATORY_CARE_PROVIDER_SITE_OTHER): Payer: Medicaid Other | Admitting: Family Medicine

## 2011-11-29 VITALS — BP 140/86 | HR 74 | Temp 98.1°F | Ht 65.0 in | Wt 153.1 lb

## 2011-11-29 DIAGNOSIS — L03119 Cellulitis of unspecified part of limb: Secondary | ICD-10-CM | POA: Insufficient documentation

## 2011-11-29 DIAGNOSIS — R21 Rash and other nonspecific skin eruption: Secondary | ICD-10-CM

## 2011-11-29 DIAGNOSIS — L0390 Cellulitis, unspecified: Secondary | ICD-10-CM

## 2011-11-29 DIAGNOSIS — L02619 Cutaneous abscess of unspecified foot: Secondary | ICD-10-CM

## 2011-11-29 LAB — CBC
MCH: 27.2 pg (ref 26.0–34.0)
Platelets: 372 10*3/uL (ref 150–400)
RBC: 4.6 MIL/uL (ref 3.87–5.11)
WBC: 7 10*3/uL (ref 4.0–10.5)

## 2011-11-29 MED ORDER — CIPROFLOXACIN HCL 750 MG PO TABS: 750.0000 mg | ORAL_TABLET | Freq: Two times a day (BID) | ORAL | Status: AC

## 2011-11-29 NOTE — Patient Instructions (Addendum)
I have sent a prescription for an antibiotic to your pharmacy.  Take as prescribed.  Use vinegar compresses 4 times a day.  Keep wound dry.  Return for follow up on Monday July 22.  If you develop fever or if wound worsens go to the ED.

## 2011-11-29 NOTE — Progress Notes (Signed)
Subjective:     Patient ID: Susan Sanders, female   DOB: 1970/12/15, 41 y.o.   MRN: [id number]  HPI Susan Sanders is a 41 year old female who presents with a CC of rash on the palms of her hands, and sole of her left foot.  1) Rash/Wound on sole of left foot and toes - She states that this started 1-2 weeks ago.  However, upon review of her chart she was seen in the ED on 6/30 for Lumbar strain and was diagnosed with Tinea Pedis at that time. - She reports that the rash has been getting worse.  Now she has developed a foul smelling wound that encompasses the toes. - She denies any injury.   - Treating at home with witch hazel "wound care", powders.  2) Hand rash - Began 1-2 weeks ago. Located on the palms of both hands. - Describe as blisters that subsequently rupture. - Associated with severe itching.  She has been taking benadryl for itching. - No history of HIV or other immunocompromising condition.  Review of Systems Denies fever, nausea, vomiting, diarrhea. Reports Itching. Denies any new medications.      Objective:   Physical Exam General:  Alert and oriented.  NAD HEENT:  No oral lesions noted. Chest:  CTAB. No rales, rhonchi, or wheeze. Heart:  RRR. +S1, S2. No murmurs, rubs, or gallops. Extremities:  Both palms have numerous vesicular lesions that have ruptured.  Necrotic area on the sole of the left foot (at the level of the Metatarsophalangeal joints).  Necrotic area also noted on the proximal and distal portions of the toes.  White adherent material noted.  Foul smelling.    Assessment:         Plan:

## 2011-11-29 NOTE — Assessment & Plan Note (Signed)
Patient has no fever or signs of systemic infection.  Appears to be Pseudomonas cellulitis.  Will treated with Acetic acid compresses 4 X daily and Cipro 750 mg BID x 10 days.  Patient urged to go to the ED if she develops fever or if continues to not improve.  Also considering Secondary syphilis with secondary bacterial infection.  RPR and CBC today.  Follow up - Monday 7/22

## 2011-11-29 NOTE — Assessment & Plan Note (Signed)
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  RPR and CBC today.  Will follow up on 7/22.

## 2011-11-30 ENCOUNTER — Telehealth: Payer: Self-pay | Admitting: Family Medicine

## 2011-11-30 NOTE — Telephone Encounter (Signed)
Ms. 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  She is concerned that the problem she's having may be due to the same thing because of the symptoms described on patient's chart.  Everything she told you she was experiencing is on the sheet for this patient.  She is quite worried and need you contact her asap.

## 2011-12-03 NOTE — Telephone Encounter (Signed)
I understand Susan Sanders's concern.  She was supposed to follow up on Monday (7/22).  If her condition worsens she needs to go to the ER if she cannot be seen in the clinic.  I am not in the clinic this week.

## 2011-12-04 ENCOUNTER — Ambulatory Visit (INDEPENDENT_AMBULATORY_CARE_PROVIDER_SITE_OTHER): Payer: Medicaid Other | Admitting: Family Medicine

## 2011-12-04 ENCOUNTER — Encounter: Payer: Self-pay | Admitting: Family Medicine

## 2011-12-04 VITALS — BP 119/79 | HR 96 | Temp 98.1°F | Ht 65.0 in | Wt 156.0 lb

## 2011-12-04 DIAGNOSIS — L03119 Cellulitis of unspecified part of limb: Secondary | ICD-10-CM

## 2011-12-04 DIAGNOSIS — R21 Rash and other nonspecific skin eruption: Secondary | ICD-10-CM

## 2011-12-04 DIAGNOSIS — L02619 Cutaneous abscess of unspecified foot: Secondary | ICD-10-CM

## 2011-12-04 MED ORDER — CIPROFLOXACIN HCL 500 MG PO TABS: 500.0000 mg | ORAL_TABLET | Freq: Two times a day (BID) | ORAL | Status: DC

## 2011-12-04 MED ORDER — TERBINAFINE HCL 250 MG PO TABS: 250.0000 mg | ORAL_TABLET | Freq: Every day | ORAL | Status: DC

## 2011-12-04 NOTE — Assessment & Plan Note (Addendum)
rxed cipro in 500 mg tabs for affordability on [facility]'s $4 list.  I also added terbinafine as it appears to  Have started as a tinea.  Will follow-up in 2 days to assess response, and need for broader abx coverage.

## 2011-12-04 NOTE — Patient Instructions (Addendum)
  Please go pick up two antibiotics- cipro and terbinafine   Keep feet clean with soap and water-  Carefully dry.  Use gold bond powder as needed to keep dry  Make follow-up appt in 2 days  Id you notice worsening pain, fever, ch ills, spreading redness, please return sooner

## 2011-12-04 NOTE — Telephone Encounter (Signed)
Called Ms. Wilhite's cell phone number, and she did not answer.  Another lady answered the call and stated that Ms. Kwan was unavailable.  I asked her to inform Ms. Funk to check her home phone for my message.

## 2011-12-04 NOTE — Telephone Encounter (Signed)
Called Susan Sanders at 8:15 on 7/23.  No answer.  I left a message stating that she should call the clinic to be seen as soon as possible.

## 2011-12-04 NOTE — Progress Notes (Signed)
  Subjective:    Patient ID: Susan Sanders, female    DOB: [birth year], 41 y.o.   MRN: [id number]  HPI  Here for follow-up of hand and foot rash  Was seen in office  On 7/18, prescribed cipro and had a normal CBC and RPR  Since that time, patient has continued to have itchy and painful rash on hand and feet. Did not pick up antibiotics due to cost.  Has been using powder to help keep dry   NO fever, chills, nausea.  Review of Systems See HPI    Objective:   Physical Exam GEN: Alert & Oriented, No acute distress Hands:  Hyperpigmented macules on palms.  Some skin colored papules.  No obvious pustules or vesicles.  Feet:  Left worse than right.  Maceration in between toes.  Tender to palpation.  Powder on feet.  No significant erythema.        Assessment & Plan:

## 2011-12-04 NOTE — Assessment & Plan Note (Signed)
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  Will defer further treatment at this time due to limited financial resources- and would like to prioritize tx for foot infection

## 2011-12-06 ENCOUNTER — Ambulatory Visit: Payer: Medicaid Other | Admitting: Family Medicine

## 2011-12-26 ENCOUNTER — Ambulatory Visit (INDEPENDENT_AMBULATORY_CARE_PROVIDER_SITE_OTHER): Payer: Medicaid Other | Admitting: Family Medicine

## 2011-12-26 ENCOUNTER — Encounter: Payer: Self-pay | Admitting: Family Medicine

## 2011-12-26 VITALS — BP 135/76 | HR 72 | Temp 99.1°F | Wt 159.0 lb

## 2011-12-26 DIAGNOSIS — L301 Dyshidrosis [pompholyx]: Secondary | ICD-10-CM

## 2011-12-26 MED ORDER — TRIAMCINOLONE ACETONIDE 0.5 % EX OINT: TOPICAL_OINTMENT | Freq: Two times a day (BID) | CUTANEOUS | Status: DC

## 2011-12-26 MED ORDER — PREDNISONE 20 MG PO TABS: 60.0000 mg | ORAL_TABLET | Freq: Every day | ORAL | Status: AC

## 2011-12-26 MED ORDER — HYDROCORTISONE VALERATE 0.2 % EX OINT: TOPICAL_OINTMENT | Freq: Two times a day (BID) | CUTANEOUS | Status: DC

## 2011-12-26 NOTE — Assessment & Plan Note (Signed)
Patient's rash and findings, both on hands and feet, appear most consistent with this diagnosis.  Does not appear to have active tinea or cellulitis at this time.  Will treat with both oral and topical steroids and have RTC in 3 days to see if this is effective.  At that time, if the patient is doing worse, will plan to repeat treatment for tinea.

## 2011-12-26 NOTE — Patient Instructions (Signed)
It was good to see you today. I want you to start taking the medication Prednisone by mouth.  You will be taking 3 pills of it daily for the next 3 days.  You will need to see me on Friday. I am also giving you prescriptions for two creams.  The one I would prefer for you to use is the triamcinalone.  If you cannot afford that, use the other one.

## 2011-12-26 NOTE — Progress Notes (Signed)
Patient ID: Susan Sanders, female   DOB: 01-29-1971, 41 y.o.   MRN: [id number] Subjective: The patient is a 41 y.o. year old female who presents today for hand/foot pain.  Reports that feet are burning and itching.  Hands are also itching.  Has been using both medications that has been prescribed but continues to have significant symptoms.  Patient's most pressing concern is burning pain and itching in feet.  She denies any fevers/chills or pain elsewhere.  Patient's past medical, social, and family history were reviewed and updated as appropriate. History  Substance Use Topics  . Smoking status: Former Smoker -- 14 years    Quit date: 09/20/2010  . Smokeless tobacco: Never Used  . Alcohol Use: No   Objective:  Filed Vitals:   12/26/11 1132  BP: 135/76  Pulse: 72  Temp: 99.1 F (37.3 C)   Gen: Mildly dysphoric but NAD Hands: Small papules bilateral hands, esp on the lateral sides of fingers.  Some present on back of hands.  There is some hyperpigmentation in areas on the palms. Feet: Small papules bilateral feet, especially on lateral sides.  There is significant swelling of the left foot with some skin maceration between toes.  This latter is present on both feet.  There is some skin pealing on the left foot, non on right foot.  No evidence of bullae, no evidence of warmth/erythema.  Assessment/Plan:  Please also see individual problems in problem list for problem-specific plans.

## 2011-12-28 ENCOUNTER — Ambulatory Visit: Payer: Medicaid Other | Admitting: Family Medicine

## 2012-01-07 ENCOUNTER — Telehealth: Payer: Self-pay | Admitting: Family Medicine

## 2012-01-07 MED ORDER — DOXYCYCLINE HYCLATE 100 MG PO TABS: 100.0000 mg | ORAL_TABLET | Freq: Two times a day (BID) | ORAL | Status: DC

## 2012-01-07 NOTE — Telephone Encounter (Signed)
Patient to keep appointment for tomorrow but should go pick up antibiotic that was sent in and start it tonight.  For pain, motrin/tylenol.

## 2012-01-07 NOTE — Telephone Encounter (Signed)
Patient notified

## 2012-01-07 NOTE — Telephone Encounter (Signed)
Patient states fourth toe is swollen and very painful. Has pus coming from it. States she has been seeing Dr. Louanne Sanders about this and has also seen Dr. Earnest Sanders. She has an already schedule appointment with Dr. Louanne Sanders tomorrow.  Offered appointment  today but advised that Dr. Louanne Sanders is not in clinic today and Dr. Earnest Sanders does not have an available appointment . States she only wants to see one of them and doesn't  want to see anyone else and will just plan to keep appointment tomorrow. Will forward message to dr. Louanne Sanders today.

## 2012-01-07 NOTE — Telephone Encounter (Signed)
Patient is calling due to pain in foot.  She did schedule an appt for tomorrow with Dr. Louanne Belton, but she wants to know what she can do for pain until then.

## 2012-01-08 ENCOUNTER — Encounter: Payer: Self-pay | Admitting: Family Medicine

## 2012-01-08 ENCOUNTER — Telehealth: Payer: Self-pay | Admitting: Home Health Services

## 2012-01-08 ENCOUNTER — Ambulatory Visit (INDEPENDENT_AMBULATORY_CARE_PROVIDER_SITE_OTHER): Payer: Medicaid Other | Admitting: Family Medicine

## 2012-01-08 ENCOUNTER — Encounter: Payer: Self-pay | Admitting: Home Health Services

## 2012-01-08 VITALS — BP 154/84 | HR 73 | Temp 99.0°F | Wt 156.0 lb

## 2012-01-08 DIAGNOSIS — M7989 Other specified soft tissue disorders: Secondary | ICD-10-CM

## 2012-01-08 DIAGNOSIS — L089 Local infection of the skin and subcutaneous tissue, unspecified: Secondary | ICD-10-CM | POA: Insufficient documentation

## 2012-01-08 DIAGNOSIS — L301 Dyshidrosis [pompholyx]: Secondary | ICD-10-CM

## 2012-01-08 MED ORDER — DOXYCYCLINE HYCLATE 100 MG PO TABS: 100.0000 mg | ORAL_TABLET | Freq: Two times a day (BID) | ORAL | Status: DC

## 2012-01-08 MED ORDER — DOXYCYCLINE HYCLATE 100 MG PO TABS: 100.0000 mg | ORAL_TABLET | Freq: Two times a day (BID) | ORAL | Status: AC

## 2012-01-08 NOTE — Assessment & Plan Note (Signed)
Improving, will continue current treatment.

## 2012-01-08 NOTE — Telephone Encounter (Signed)
Patient was given $5.00 from [facility] to help with antibiotic co-payment.  Pt is aware this is a one time source of help.  Pt was given letter for pharmacist to sign and fax back to [place] once she picks up the medication.

## 2012-01-08 NOTE — Progress Notes (Signed)
Patient ID: Susan Sanders, female   DOB: 1970-11-28, 41 y.o.   MRN: [id number] Subjective: The patient is a 41 y.o. year old female who presents today for f/u.  1. Rash: Considerably better.  Past 4 days has been having increased swelling of one toe along with fevers/chills.  Was called in doxy yesterday but has not been able to afford to get rx filled.  No n/v/d.  Does feel some warmth of the foot and tenderness of the forefoot.  She reports purulent drainage beginning last night.  Patient's past medical, social, and family history were reviewed and updated as appropriate. History  Substance Use Topics  . Smoking status: Former Smoker -- 14 years    Quit date: 09/20/2010  . Smokeless tobacco: Never Used  . Alcohol Use: No   Objective:  Filed Vitals:   01/08/12 1403  BP: 154/84  Pulse: 73  Temp: 99 F (37.2 C)   Gen: Mildly dysphoric but NAD Hands: Small areas of resolving papules with slight hypopigmentation.  No skin breakdown. Feet: Resolving papules are present.  No more desquamation.  No bulla.  Left 4th toe has area on the medial side with purulent drainage.  Is notably swolen and tender to palpation.  There may be a small amount of erythema and tenderness in the forefoot.  Assessment/Plan:  Please also see individual problems in problem list for problem-specific plans.

## 2012-01-08 NOTE — Assessment & Plan Note (Signed)
Importance of treatment stressed.  Patient currently without money for copay, however her mother has agreed to pick up antibiotic rx.  Will treat with doxy.  If patient experiences worsening symptoms she was instructed to return immediately. She will RTC in 4 days for recheck.  Patient may have simply cellulitis but I do worry about the possibility of abscess formation.  If this is what has happened, she will likely need surgical consultation.  I will initially try oral antibiotics.

## 2012-01-08 NOTE — Patient Instructions (Addendum)
Please get your prescription filled and start it tonight. Come back to see [place] in 2-3 days. As much as possible, elevate and ice your foot.

## 2012-01-11 ENCOUNTER — Ambulatory Visit: Payer: Medicaid Other | Admitting: Family Medicine

## 2012-01-11 LAB — WOUND CULTURE

## 2012-02-24 ENCOUNTER — Encounter (HOSPITAL_COMMUNITY): Payer: Self-pay | Admitting: *Deleted

## 2012-02-24 ENCOUNTER — Emergency Department (INDEPENDENT_AMBULATORY_CARE_PROVIDER_SITE_OTHER)
Admission: EM | Admit: 2012-02-24 | Discharge: 2012-02-24 | Disposition: A | Discharge status: Home / Self Care | Payer: Self-pay | Source: Home / Self Care

## 2012-02-24 DIAGNOSIS — A088 Other specified intestinal infections: Secondary | ICD-10-CM

## 2012-02-24 DIAGNOSIS — A084 Viral intestinal infection, unspecified: Secondary | ICD-10-CM

## 2012-02-24 LAB — POCT URINALYSIS DIP (DEVICE)
Ketones, ur: NEGATIVE mg/dL
Leukocytes, UA: NEGATIVE
Protein, ur: NEGATIVE mg/dL
pH: 5.5 (ref 5.0–8.0)

## 2012-02-24 MED ORDER — ONDANSETRON HCL 4 MG PO TABS: 4.0000 mg | ORAL_TABLET | Freq: Four times a day (QID) | ORAL | Status: DC

## 2012-02-24 NOTE — ED Notes (Signed)
Pt reports sudden onset of nausea, diarrhea and vomiting that started last night with continued diarrhea and body aches today

## 2012-02-24 NOTE — ED Provider Notes (Signed)
History     CSN: [id number]  Arrival date & time 02/24/12  1110   None     Chief Complaint  Patient presents with  . Abdominal Pain    (Consider location/radiation/quality/duration/timing/severity/associated sxs/prior treatment) HPI Comments: 41 year old presents with discomfort in the abdomen and back. This developed last night while at work. She suddenly developed diaphoresis associated with diarrhea and vomiting that lasted for 3-4 hours. She took an Imodium  and it checked the diarrhea, slowing down. She also complains of bodyaches and just feels cold, but no fever. She denies GU symptoms. The pain in the back is generalized from the neck to the lumbar area and no specific area hurts worse than the other. The abdominal discomfort is also generalized.  Patient is a 41 y.o. female presenting with abdominal pain.  Abdominal Pain The primary symptoms of the illness include abdominal pain, fatigue, nausea, vomiting and diarrhea. The primary symptoms of the illness do not include fever or shortness of breath.  Additional symptoms associated with the illness include back pain.    Past Medical History  Diagnosis Date  . Scoliosis   . Complication of anesthesia     difficult waking up "  . Chest pain   . Depression   . Chronic back pain greater than 3 months duration     Past Surgical History  Procedure Date  . Tubal ligation     Family History  Problem Relation Age of Onset  . Family history unknown: Yes    History  Substance Use Topics  . Smoking status: Former Smoker -- 14 years    Quit date: 09/20/2010  . Smokeless tobacco: Never Used  . Alcohol Use: No    OB History    Grav Para Term Preterm Abortions TAB SAB Ect Mult Living                  Review of Systems  Constitutional: Positive for appetite change and fatigue. Negative for fever and activity change.  HENT: Negative.   Respiratory: Negative for cough, shortness of breath and wheezing.     Cardiovascular: Negative for chest pain and palpitations.  Gastrointestinal: Positive for nausea, vomiting, abdominal pain and diarrhea. Negative for blood in stool and abdominal distention.  Genitourinary: Negative.   Musculoskeletal: Positive for back pain.  Skin: Negative for color change, pallor and rash.  Neurological: Negative.     Allergies  Amoxicillin  Home Medications   Current Outpatient Rx  Name Route Sig Dispense Refill  . ACETAMINOPHEN 500 MG PO TABS Oral Take 1,000 mg by mouth every 6 (six) hours as needed. For pain    . HYDROCORTISONE VALERATE 0.2 % EX OINT Topical Apply topically 2 (two) times daily. Use if cannot afford triamcinolone 45 g 0  . ADULT MULTIVITAMIN W/MINERALS CH Oral Take 1 tablet by mouth daily.    Marland Kitchen OMEPRAZOLE 20 MG PO CPDR Oral Take 1 capsule (20 mg total) by mouth daily. 30 capsule 3  . ONDANSETRON HCL 4 MG PO TABS Oral Take 1 tablet (4 mg total) by mouth every 6 (six) hours. 12 tablet 0  . SERTRALINE HCL 50 MG PO TABS Oral Take 50 mg by mouth daily.    . TRIAMCINOLONE ACETONIDE 0.5 % EX OINT Topical Apply topically 2 (two) times daily. 30 g 0    BP 164/94  Pulse 67  Temp 98.3 F (36.8 C) (Oral)  Resp 18  SpO2 100%  LMP 01/25/2012  Physical Exam  Constitutional: She is  oriented to person, place, and time. She appears well-developed and well-nourished. No distress.  HENT:  Head: Normocephalic and atraumatic.  Right Ear: External ear normal.  Left Ear: External ear normal.  Mouth/Throat: Oropharynx is clear and moist. No oropharyngeal exudate.  Eyes: EOM are normal. Pupils are equal, round, and reactive to light.  Neck: Normal range of motion. Neck supple.  Cardiovascular: Normal rate and normal heart sounds.   Pulmonary/Chest: Effort normal and breath sounds normal. No respiratory distress.  Abdominal: Soft. She exhibits no distension and no mass. There is no tenderness. There is no rebound and no guarding.  Musculoskeletal: Normal  range of motion. She exhibits tenderness.       Generalized muscle tenderness in the entire back.  Neurological: She is alert and oriented to person, place, and time. No cranial nerve deficit.  Skin: Skin is warm and dry. No rash noted. No erythema.  Psychiatric: She has a normal mood and affect.    ED Course  Procedures (including critical care time)  Labs Reviewed - No data to display No results found.   1. Viral gastroenteritis       MDM  U/A: Tr-lysed bld. Neg nit/leuk. SG 1.020. Followed the [employer], clear liquids for now. May continue to take Imodium right ear for diarrhea but only sufficient enough to slow it down did not stop the diarrhea as this may make it worse Zofran 1 acute 4-6 hours as needed for nausea or vomiting. Tylenol every 4 hours when necessary aches pains or fever. Out of work for the next 2 days. Results for orders placed in visit on 01/08/12  WOUND CULTURE      Component Value Range   Culture       Value: Moderate METHICILLIN RESISTANT STAPHYLOCOCCUS AUREUS   GRAM STAIN No WBC Seen     GRAM STAIN No Squamous Epithelial Cells Seen     GRAM STAIN Few Gram Positive Cocci In Pairs In Clusters     Organism ID, Bacteria METHICILLIN RESISTANT STAPHYLOCOCCUS AUREUS             Hayden Rasmussen, NP 02/24/12 1249

## 2012-02-24 NOTE — ED Provider Notes (Signed)
Medical screening examination/treatment/procedure(s) were performed by resident physician or non-physician practitioner and as supervising physician I was immediately available for consultation/collaboration.   Barkley Bruns MD.    Linna Hoff, MD 02/24/12 [phone]

## 2012-03-20 ENCOUNTER — Ambulatory Visit (INDEPENDENT_AMBULATORY_CARE_PROVIDER_SITE_OTHER): Payer: Medicaid Other | Admitting: Family Medicine

## 2012-03-20 ENCOUNTER — Encounter: Payer: Self-pay | Admitting: Family Medicine

## 2012-03-20 VITALS — BP 150/88 | HR 66 | Temp 98.6°F | Ht 65.0 in | Wt 156.0 lb

## 2012-03-20 DIAGNOSIS — L089 Local infection of the skin and subcutaneous tissue, unspecified: Secondary | ICD-10-CM

## 2012-03-20 MED ORDER — TRIAMCINOLONE ACETONIDE 0.5 % EX OINT: TOPICAL_OINTMENT | Freq: Two times a day (BID) | CUTANEOUS | Status: DC

## 2012-03-20 MED ORDER — DOXYCYCLINE HYCLATE 100 MG PO TABS: 100.0000 mg | ORAL_TABLET | Freq: Two times a day (BID) | ORAL | Status: DC

## 2012-03-20 MED ORDER — ACETAMINOPHEN-CODEINE #3 300-30 MG PO TABS: 1.0000 | ORAL_TABLET | ORAL | Status: DC | PRN

## 2012-03-20 NOTE — Patient Instructions (Addendum)
Please get the antibiotic and start it today at lunch. Come back to see me tomorrow.

## 2012-03-20 NOTE — Assessment & Plan Note (Signed)
Patient only has a bacterial infection of the right great toe. The most likely inciting event is a flare of her dyshidrotic eczema.  Rx abx and steroid cream along with pain control.  Pt instructed to RTC tomorrow for recheck.

## 2012-03-20 NOTE — Progress Notes (Signed)
Patient ID: Susan Sanders, female   DOB: [birth year], 41 y.o.   MRN: [id number] Subjective: The patient is a 41 y.o. year old female who presents today for right toe pain.  Pain began about 3 days ago and has been progressively worse.  Accompanied by some drainage (clear), and fevers/chills.  No skin symptoms elsewhere on body.  Is still able to move toe and walk.  Had previous episode like this that was diagnosed as a superinfection of dishydrotic eczma.  Patient's past medical, social, and family history were reviewed and updated as appropriate. History  Substance Use Topics  . Smoking status: Former Smoker -- 14 years    Quit date: 09/20/2010  . Smokeless tobacco: Never Used  . Alcohol Use: No   Objective:  Filed Vitals:   03/20/12 1003  BP: 150/88  Pulse: 66  Temp: 98.6 F (37 C)   Gen: NAD Ext: Right great toe is warm and red.  Significant tenderness on palpation.  Patient has been putting some baby powder on toe and this is caked in an exudate like appearance but patient insists that is not there when she does not put powder on.  There is evidence of clear drainage.  No fluctuance.  Assessment/Plan:  Please also see individual problems in problem list for problem-specific plans.

## 2012-03-21 ENCOUNTER — Ambulatory Visit (INDEPENDENT_AMBULATORY_CARE_PROVIDER_SITE_OTHER): Payer: Medicaid Other | Admitting: Family Medicine

## 2012-03-21 VITALS — BP 149/86 | HR 69 | Temp 98.0°F

## 2012-03-21 DIAGNOSIS — L089 Local infection of the skin and subcutaneous tissue, unspecified: Secondary | ICD-10-CM

## 2012-03-21 NOTE — Patient Instructions (Signed)
It was good to see you today! Keep taking your antibiotic and come back to see me on Wednesday or Thursday of this coming week. Apply the dressing I am giving you a prescription for 2 times per day until then.

## 2012-03-26 ENCOUNTER — Ambulatory Visit: Payer: Medicaid Other | Admitting: Family Medicine

## 2012-03-27 ENCOUNTER — Encounter: Payer: Self-pay | Admitting: Family Medicine

## 2012-03-27 ENCOUNTER — Ambulatory Visit (INDEPENDENT_AMBULATORY_CARE_PROVIDER_SITE_OTHER): Payer: Medicaid Other | Admitting: Family Medicine

## 2012-03-27 VITALS — BP 136/93 | HR 96 | Temp 98.8°F

## 2012-03-27 DIAGNOSIS — L089 Local infection of the skin and subcutaneous tissue, unspecified: Secondary | ICD-10-CM

## 2012-03-27 MED ORDER — TRAMADOL HCL 50 MG PO TABS: 50.0000 mg | ORAL_TABLET | Freq: Three times a day (TID) | ORAL | Status: DC | PRN

## 2012-03-27 MED ORDER — HYDROCODONE-ACETAMINOPHEN 10-325 MG PO TABS: 1.0000 | ORAL_TABLET | Freq: Three times a day (TID) | ORAL | Status: DC | PRN

## 2012-03-27 MED ORDER — TERBINAFINE HCL 250 MG PO TABS: ORAL_TABLET | ORAL | Status: DC

## 2012-03-27 NOTE — Assessment & Plan Note (Signed)
Appears to be improving.  Will continue abx and will rx silver alginate to help with maceration.  RTC 1 week.

## 2012-03-27 NOTE — Progress Notes (Signed)
Patient ID: TAUNYA GORAL, female   DOB: 10/12/1970, 41 y.o.   MRN: [id number] Subjective: The patient is a 41 y.o. year old female who presents today for f/u.  Reports toe was getting better until 4 days ago then began to get worse.  Is still both itching and hurting.  Now pain is in both feet and, on the right, is extending up into the leg.  She reports feeling feverish and having a lot of pain.  She has continued with her doxycycline and topical kenalog.  Drainage is significantly more but continues to be clear in character.  Patient's past medical, social, and family history were reviewed and updated as appropriate. History  Substance Use Topics  . Smoking status: Former Smoker -- 14 years    Quit date: 09/20/2010  . Smokeless tobacco: Never Used  . Alcohol Use: No   Objective:  Filed Vitals:   03/27/12 1112  BP: 136/93  Pulse: 96  Temp: 98.8 F (37.1 C)   Gen: Mildly distressed but not ill appearing Ext: There is mild warmth and redness of the right foot, none of the left.  There are multiple bullae on the bottom of the right foot.  None present on left.  There is some skin lichinification on the left with significant maceration on the right.  There is a fair amount of serous drainage from the right foot, esp from the right great toe with has mostly lost its epidermis.  Assessment/Plan:  Please also see individual problems in problem list for problem-specific plans.

## 2012-03-27 NOTE — Patient Instructions (Signed)
Take the new medicine I am giving you today.  Take it two times today and then two times per day for the next week, then daily for 6 weeks. Plan on coming back to see me tomorrow afternoon. If you start running a high fever (>102) come back to the ER immediately.

## 2012-03-27 NOTE — Assessment & Plan Note (Signed)
The patient's course is somewhat prolonged and slightly confusing. She was initially diagnosed with a pseudomonal infection but did not fully treat this (she did not take antibiotics) and it seemed to get better at least for a while. She then re\re presented was diagnosed with tinea pedis. She may or may not have completed treatment for this and, on reproduce and patient, and it up being diagnosed with dyshidrotic eczema as she was assumed to have failed treatment for tinea. She presents again today with a worsening course area and our best understanding of what has happened would be that she has had an undertreated tinea infection that got better with systemic steroids and now is flaring again. The patient does not have any objective fever or vital sign instability. We will begin treatment with terbinafine and check a white count. We will see her back tomorrow. I would plan a 6 week course for her terbinafine.

## 2012-03-27 NOTE — Progress Notes (Signed)
Patient ID: SHELSY SENG, female   DOB: Feb 24, 1971, 41 y.o.   MRN: [id number] Subjective: The patient is a 41 y.o. year old female who presents today for f/u.  Foot doing somewhat better, still with significant drainage.  No fevers/chills or spreading redness.  She notes clear drainage, not purulent, and is able to move all joints.  Pain is improved.  Patient's past medical, social, and family history were reviewed and updated as appropriate. History  Substance Use Topics  . Smoking status: Former Smoker -- 14 years    Quit date: 09/20/2010  . Smokeless tobacco: Never Used  . Alcohol Use: No   Objective:  Filed Vitals:   03/21/12 1135  BP: 149/86  Pulse: 69  Temp: 98 F (36.7 C)   Gen: NAD Ext: There is significant maceration of the toes on right foot but erythema is decreased.  There is no purulent drainage and patient has decreased swelling.  No loss of skin.  Assessment/Plan:   Please also see individual problems in problem list for problem-specific plans.

## 2012-03-28 ENCOUNTER — Ambulatory Visit (INDEPENDENT_AMBULATORY_CARE_PROVIDER_SITE_OTHER): Payer: Medicaid Other | Admitting: Family Medicine

## 2012-03-28 ENCOUNTER — Encounter: Payer: Self-pay | Admitting: Family Medicine

## 2012-03-28 VITALS — BP 127/76 | HR 81 | Temp 98.1°F | Ht 65.0 in | Wt 153.7 lb

## 2012-03-28 DIAGNOSIS — L089 Local infection of the skin and subcutaneous tissue, unspecified: Secondary | ICD-10-CM

## 2012-03-28 LAB — CBC WITH DIFFERENTIAL/PLATELET
Basophils Absolute: 0 10*3/uL (ref 0.0–0.1)
Basophils Relative: 0 % (ref 0–1)
Eosinophils Absolute: 0.3 10*3/uL (ref 0.0–0.7)
Eosinophils Relative: 3 % (ref 0–5)
HCT: 37.7 % (ref 36.0–46.0)
MCHC: 32.1 g/dL (ref 30.0–36.0)
MCV: 83.6 fL (ref 78.0–100.0)
Monocytes Absolute: 0.4 10*3/uL (ref 0.1–1.0)
RDW: 14.7 % (ref 11.5–15.5)

## 2012-03-28 LAB — BASIC METABOLIC PANEL
BUN: 11 mg/dL (ref 6–23)
Creat: 0.72 mg/dL (ref 0.50–1.10)

## 2012-03-28 MED ORDER — CLOTRIMAZOLE-BETAMETHASONE 1-0.05 % EX CREA: TOPICAL_CREAM | Freq: Two times a day (BID) | CUTANEOUS | Status: DC

## 2012-03-28 NOTE — Progress Notes (Signed)
Patient ID: Susan Sanders, female   DOB: March 16, 1971, 41 y.o.   MRN: [id number] Subjective: The patient is a 41 y.o. year old female who presents today for f/u.  Reports significant improvement in pain since yesterday.  Patient has been elevating feet and leaving open to air.  No more fevers/chills.  Patient's past medical, social, and family history were reviewed and updated as appropriate. History  Substance Use Topics  . Smoking status: Former Smoker -- 14 years    Quit date: 09/20/2010  . Smokeless tobacco: Never Used  . Alcohol Use: No   Objective:  Filed Vitals:   03/28/12 1355  BP: 127/76  Pulse: 81  Temp: 98.1 F (36.7 C)   Gen: NAD Ext: There is no warmth and mild redness of the right foot, none of the left.  Multiple bullae on the bottom of the right foot have burst.  None present on left although there is more serous drainage from the left than previously.  There is some skin lichinification on the left with significant maceration on the right.  There is a mild amount of serous drainage from the right foot, esp from the right great toe with has mostly lost its epidermis.  Assessment/Plan:  Please also see individual problems in problem list for problem-specific plans.

## 2012-03-28 NOTE — Patient Instructions (Signed)
It was good to see you today! Please get back in to see me early next week.

## 2012-03-28 NOTE — Assessment & Plan Note (Signed)
Appears to have improved somewhat.  WBC is normal so doubt significant bacterial infection.  Will continue current treatment and add steroid/antifungal cream for topical treatment.  RTC in 5 days.  Advised to return sooner if worsens

## 2012-03-31 ENCOUNTER — Telehealth: Payer: Self-pay | Admitting: Family Medicine

## 2012-03-31 ENCOUNTER — Encounter: Payer: Self-pay | Admitting: Family Medicine

## 2012-03-31 MED ORDER — KETOCONAZOLE 2 % EX CREA: TOPICAL_CREAM | Freq: Every day | CUTANEOUS | Status: DC

## 2012-03-31 NOTE — Telephone Encounter (Signed)
Patient states that medication prescribed for her is $100  And the pain is up her leg to hip still. Is there anything else that can be prescribed for her that is cheaper. She also is still having the oozing from feet. Patient is wanting a letter to excuse her from work today and tomorrow. I will write that. She will call back with fax number to send it to.

## 2012-03-31 NOTE — Telephone Encounter (Signed)
Pt states that her feet are not any better and wants to speak with Dr Louanne Belton asap -

## 2012-03-31 NOTE — Telephone Encounter (Signed)
Nothing cheaper that is a combination product exists.  She should try getting the medication I just sent in (topical ketoconazole) and applying it once daily at the same time as when she applies the triamcinolone.  As for the pain, we will just have to wait and see.  As long as it is not getting a lot worse I would suggest just continuing to do what she has been doing.  This is going to be a slow process.  When I see her Wednesday, if she is still having the pain, I will be happy to refill the Norco but there isn't anything else I can think of to help immediately.

## 2012-04-02 ENCOUNTER — Ambulatory Visit: Payer: Medicaid Other | Admitting: Family Medicine

## 2012-04-20 ENCOUNTER — Encounter (HOSPITAL_COMMUNITY): Payer: Self-pay

## 2012-04-20 ENCOUNTER — Emergency Department (HOSPITAL_COMMUNITY)
Admission: EM | Admit: 2012-04-20 | Discharge: 2012-04-20 | Disposition: A | Discharge status: Home / Self Care | Payer: Medicaid Other | Attending: Emergency Medicine | Admitting: Emergency Medicine

## 2012-04-20 DIAGNOSIS — Z8659 Personal history of other mental and behavioral disorders: Secondary | ICD-10-CM | POA: Insufficient documentation

## 2012-04-20 DIAGNOSIS — M4120 Other idiopathic scoliosis, site unspecified: Secondary | ICD-10-CM | POA: Insufficient documentation

## 2012-04-20 DIAGNOSIS — Z87891 Personal history of nicotine dependence: Secondary | ICD-10-CM | POA: Insufficient documentation

## 2012-04-20 DIAGNOSIS — G8929 Other chronic pain: Secondary | ICD-10-CM | POA: Insufficient documentation

## 2012-04-20 DIAGNOSIS — L301 Dyshidrosis [pompholyx]: Secondary | ICD-10-CM | POA: Insufficient documentation

## 2012-04-20 DIAGNOSIS — L02619 Cutaneous abscess of unspecified foot: Secondary | ICD-10-CM | POA: Insufficient documentation

## 2012-04-20 HISTORY — DX: Dermatitis, unspecified: L30.9

## 2012-04-20 HISTORY — DX: Cellulitis, unspecified: L03.90

## 2012-04-20 MED ORDER — KETOROLAC TROMETHAMINE 60 MG/2ML IM SOLN
60.0000 mg | Freq: Once | INTRAMUSCULAR | Status: AC
  Administered 2012-04-20: 60 mg via INTRAMUSCULAR
  Filled 2012-04-20: qty 2

## 2012-04-20 MED ORDER — OXYCODONE-ACETAMINOPHEN 5-325 MG PO TABS: 2.0000 | ORAL_TABLET | ORAL | Status: DC | PRN

## 2012-04-20 MED ORDER — HYDROMORPHONE HCL PF 1 MG/ML IJ SOLN
1.0000 mg | Freq: Once | INTRAMUSCULAR | Status: AC
  Administered 2012-04-20: 1 mg via INTRAMUSCULAR
  Filled 2012-04-20: qty 1

## 2012-04-20 NOTE — ED Notes (Signed)
Pt sees family practice clinics and was told she had eczema and cellulitis on her feet.  Pt has been on antibiotics and they have been changed to attempt better control of the infection.  Pt presents today with increased pain.  Bilateral feet appear swollen and with broken skin.  Pt states this has been ongoing for about a month.

## 2012-04-20 NOTE — ED Provider Notes (Signed)
History     CSN: [id number]  Arrival date & time 04/20/12  0706   First MD Initiated Contact with Patient 04/20/12 [phone]      Chief Complaint  Patient presents with  . Foot Pain  . Cellulitis    (Consider location/radiation/quality/duration/timing/severity/associated sxs/prior treatment) Patient is a 41 y.o. female presenting with lower extremity pain. The history is provided by the patient and a relative.  Foot Pain   patient here complaining of bilateral foot pain x2 days which is gradually getting worse. Does have a history of eczema and is currently being treated for that with topical antifungals and steroids. She scheduled to see a wound care specialists in 4 days. Notes pain is sharp and worse with standing. She worked last night when she does a lot of standing and today is a lot worse. Fever or chills. No redness going up her legs. No other treatments used prior to arrival  Past Medical History  Diagnosis Date  . Scoliosis   . Complication of anesthesia     difficult waking up "  . Chest pain   . Depression   . Chronic back pain greater than 3 months duration   . Eczema   . Cellulitis     Past Surgical History  Procedure Date  . Tubal ligation     No family history on file.  History  Substance Use Topics  . Smoking status: Former Smoker -- 14 years    Quit date: 09/20/2010  . Smokeless tobacco: Never Used  . Alcohol Use: No    OB History    Grav Para Term Preterm Abortions TAB SAB Ect Mult Living                  Review of Systems  All other systems reviewed and are negative.    Allergies  Amoxicillin and Tramadol  Home Medications   Current Outpatient Rx  Name  Route  Sig  Dispense  Refill  . ACETAMINOPHEN 500 MG PO TABS   Oral   Take 1,000 mg by mouth every 6 (six) hours as needed. For pain         . DOXYCYCLINE HYCLATE 100 MG PO TABS   Oral   Take 1 tablet (100 mg total) by mouth 2 (two) times daily.   20 tablet   0   .  HYDROCODONE-ACETAMINOPHEN 10-325 MG PO TABS   Oral   Take 1 tablet by mouth every 8 (eight) hours as needed for pain.   30 tablet   0   . KETOCONAZOLE 2 % EX CREA   Topical   Apply topically daily.   30 g   0   . ADULT MULTIVITAMIN W/MINERALS CH   Oral   Take 1 tablet by mouth daily.         Marland Kitchen ONDANSETRON HCL 4 MG PO TABS   Oral   Take 1 tablet (4 mg total) by mouth every 6 (six) hours.   12 tablet   0   . SERTRALINE HCL 50 MG PO TABS   Oral   Take 50 mg by mouth daily.         . TERBINAFINE HCL 250 MG PO TABS      Take 1 pill by mouth twice daily for 7 days then once daily for 6 weeks.   56 tablet   0   . TRIAMCINOLONE ACETONIDE 0.5 % EX OINT   Topical   Apply topically 2 (two) times daily.  30 g   0     BP 142/95  Pulse 81  Temp 98.2 F (36.8 C) (Oral)  Resp 20  SpO2 100%  LMP 03/31/2012  Physical Exam  Nursing note and vitals reviewed. Constitutional: She is oriented to person, place, and time. She appears well-developed and well-nourished.  Non-toxic appearance. No distress.  HENT:  Head: Normocephalic and atraumatic.  Eyes: Conjunctivae normal, EOM and lids are normal. Pupils are equal, round, and reactive to light.  Neck: Normal range of motion. Neck supple. No tracheal deviation present. No mass present.  Cardiovascular: Normal rate, regular rhythm and normal heart sounds.  Exam reveals no gallop.   No murmur heard. Pulmonary/Chest: Effort normal and breath sounds normal. No stridor. No respiratory distress. She has no decreased breath sounds. She has no wheezes. She has no rhonchi. She has no rales.  Abdominal: Soft. Normal appearance and bowel sounds are normal. She exhibits no distension. There is no tenderness. There is no rebound and no CVA tenderness.  Musculoskeletal: Normal range of motion. She exhibits no edema and no tenderness.       Bilateral feet with chronic skin discoloration and eczema-like changes. On the sole the right foot,  there is some exposed dermis at the great toe and distal sole of the foot. There is no periodic drainage. There is no erythema of both feet.  Neurological: She is alert and oriented to person, place, and time. She has normal strength. No cranial nerve deficit or sensory deficit. GCS eye subscore is 4. GCS verbal subscore is 5. GCS motor subscore is 6.  Skin: Skin is warm and dry. No abrasion and no rash noted.  Psychiatric: She has a normal mood and affect. Her speech is normal and behavior is normal.    ED Course  Procedures (including critical care time)  Labs Reviewed - No data to display No results found.   No diagnosis found.    MDM  Patient given pain medication here feels better. Feet are without cellulitis and she has an appointment to be seen in the wound care clinic next week.        Toy Baker, MD 04/20/12 1048

## 2012-04-22 ENCOUNTER — Encounter: Payer: Self-pay | Admitting: Family Medicine

## 2012-04-22 ENCOUNTER — Ambulatory Visit (INDEPENDENT_AMBULATORY_CARE_PROVIDER_SITE_OTHER): Payer: Medicaid Other | Admitting: Family Medicine

## 2012-04-22 VITALS — BP 148/90 | HR 72 | Temp 97.8°F | Ht 65.0 in | Wt 157.0 lb

## 2012-04-22 DIAGNOSIS — M79609 Pain in unspecified limb: Secondary | ICD-10-CM

## 2012-04-22 DIAGNOSIS — R52 Pain, unspecified: Secondary | ICD-10-CM

## 2012-04-22 DIAGNOSIS — M79673 Pain in unspecified foot: Secondary | ICD-10-CM

## 2012-04-22 DIAGNOSIS — M79605 Pain in left leg: Secondary | ICD-10-CM | POA: Insufficient documentation

## 2012-04-22 DIAGNOSIS — M79604 Pain in right leg: Secondary | ICD-10-CM | POA: Insufficient documentation

## 2012-04-22 MED ORDER — OXYCODONE-ACETAMINOPHEN 5-325 MG PO TABS: 2.0000 | ORAL_TABLET | Freq: Four times a day (QID) | ORAL | Status: DC | PRN

## 2012-04-22 MED ORDER — KETOROLAC TROMETHAMINE 30 MG/ML IJ SOLN
30.0000 mg | Freq: Once | INTRAMUSCULAR | Status: AC
  Administered 2012-04-22: 30 mg via INTRAMUSCULAR

## 2012-04-22 MED ORDER — CLOBETASOL PROPIONATE 0.05 % EX CREA: TOPICAL_CREAM | Freq: Two times a day (BID) | CUTANEOUS | Status: DC

## 2012-04-22 NOTE — Assessment & Plan Note (Addendum)
Patient with maceration of skin of foot, bullae noted.   She has had minimal to no relief from anti-fungals nor antibiotics.  Some relief with Triamcinolone ointment. She currently has no signs of cellulitis.   Possibly auto-immune disease.  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  She also has the intense pruritis of pemphigoid, but does report intense pain as well (which is often more associated with pemphigus).   As patient experienced relief with Triamcinolone rather than anti-fungals, starting short-term course of Clobetasol with FU in 2 weeks. Recommend biopsy next visit if no clearer indications of diagnosis.  Oral and written warnings against stopping this medication if rash suddently worsenes or any systemic side effects.    She has follow-up with wound care in 2 days.  Encouraged to keep this, hopeful their recommendations will help the macerated areas heal.  Feet wrapped with non-stick bandage and gauze here in clinic.   She has used almost all of the Oxycodone (prescribed as 2 tablets every 4 hours in ED, which she has been taking with only some relief).  Provided her 15 more pills with instructions to take every 6 hours rather than 4.

## 2012-04-22 NOTE — Patient Instructions (Addendum)
Apply the Clobetasol to your feet twice a day.  If you notice this is getting worse, stop immediately.   Make sure to follow up with the [facility].  Come back in 2 weeks to see your PCP.    It was good to meet you

## 2012-04-23 NOTE — Progress Notes (Signed)
  Subjective:    Patient ID: Susan Sanders, female    DOB: 16-May-1970, 41 y.o.   MRN: [id number]  HPI  1.  Foot pain and rash:  Patient has been struggling with this for past 1-2 months.  Has been seen multiple times here in clinic due to rash.  Presented to ED 2 days ago due to severe pain in foot.  Has some sloughing of skin and these raw areas are very tender.  Bottoms of both feet are tender but Left is worse than right.  Feet were wrapped and non-stick bandage applied in ED.  Has been using Ketoconazole cream as well as terbinafine without relief.  Has also attempted Triamcinolone cream which may have helped somewhat.    However her daughter has triamcinolone ointment, which patient applied to her feet and said she had relief from both pain but especially from intense pruritis she experiences on dorsal aspect of feet.  No fevers or chills.  Does have "boil" noted on pre-tibial aspect of Left leg.   She has also been treated with several courses of Antibiotics without resolution of symptoms.   Review of Systems See HPI above for review of systems.       Objective:   Physical Exam Gen:  AAF in no acute distress, but does appear to be in pain.  One episode of crying due to pain Ext:   - Left leg:  2 mm pustule noted pre-tibial aspect of Left LE.  No surrounding erythema.   - Right foot:  Multiple papules and nodules with some ulcers noted along medial, lateral, and dorsal aspect of foot and covering entire plantar aspect of foot.  Area of maceration noted along heel.  No erythema or rubor from feet. Left foot:  Multiple areas of sloughed skin plantar aspect of foot, mostly heel and ball of foot.  Some bullae noted on ball as well as dorsum of foot.  These are tense when palpated and do not expand.        Assessment & Plan:

## 2012-04-24 ENCOUNTER — Inpatient Hospital Stay (HOSPITAL_COMMUNITY)
Admission: EM | Admit: 2012-04-24 | Discharge: 2012-04-28 | DRG: 278 | Disposition: A | Discharge status: Home / Self Care | Payer: BC Managed Care – PPO | Attending: Family Medicine | Admitting: Family Medicine

## 2012-04-24 ENCOUNTER — Encounter (HOSPITAL_BASED_OUTPATIENT_CLINIC_OR_DEPARTMENT_OTHER): Payer: BC Managed Care – PPO | Attending: Internal Medicine

## 2012-04-24 ENCOUNTER — Emergency Department (HOSPITAL_COMMUNITY): Payer: BC Managed Care – PPO

## 2012-04-24 ENCOUNTER — Encounter (HOSPITAL_COMMUNITY): Payer: Self-pay | Admitting: Emergency Medicine

## 2012-04-24 DIAGNOSIS — F3289 Other specified depressive episodes: Secondary | ICD-10-CM | POA: Diagnosis present

## 2012-04-24 DIAGNOSIS — M25559 Pain in unspecified hip: Secondary | ICD-10-CM | POA: Diagnosis present

## 2012-04-24 DIAGNOSIS — L03119 Cellulitis of unspecified part of limb: Secondary | ICD-10-CM

## 2012-04-24 DIAGNOSIS — M4120 Other idiopathic scoliosis, site unspecified: Secondary | ICD-10-CM | POA: Diagnosis present

## 2012-04-24 DIAGNOSIS — F329 Major depressive disorder, single episode, unspecified: Secondary | ICD-10-CM | POA: Diagnosis present

## 2012-04-24 DIAGNOSIS — L080 Pyoderma: Secondary | ICD-10-CM | POA: Diagnosis present

## 2012-04-24 DIAGNOSIS — F419 Anxiety disorder, unspecified: Secondary | ICD-10-CM | POA: Diagnosis present

## 2012-04-24 DIAGNOSIS — L02619 Cutaneous abscess of unspecified foot: Secondary | ICD-10-CM | POA: Insufficient documentation

## 2012-04-24 DIAGNOSIS — A4902 Methicillin resistant Staphylococcus aureus infection, unspecified site: Secondary | ICD-10-CM | POA: Diagnosis present

## 2012-04-24 DIAGNOSIS — L0291 Cutaneous abscess, unspecified: Secondary | ICD-10-CM | POA: Diagnosis present

## 2012-04-24 DIAGNOSIS — B353 Tinea pedis: Secondary | ICD-10-CM | POA: Insufficient documentation

## 2012-04-24 DIAGNOSIS — Z79899 Other long term (current) drug therapy: Secondary | ICD-10-CM

## 2012-04-24 DIAGNOSIS — F411 Generalized anxiety disorder: Secondary | ICD-10-CM | POA: Diagnosis present

## 2012-04-24 DIAGNOSIS — G43909 Migraine, unspecified, not intractable, without status migrainosus: Secondary | ICD-10-CM | POA: Diagnosis present

## 2012-04-24 DIAGNOSIS — L301 Dyshidrosis [pompholyx]: Secondary | ICD-10-CM | POA: Diagnosis present

## 2012-04-24 DIAGNOSIS — F17200 Nicotine dependence, unspecified, uncomplicated: Secondary | ICD-10-CM | POA: Diagnosis present

## 2012-04-24 DIAGNOSIS — L2089 Other atopic dermatitis: Secondary | ICD-10-CM

## 2012-04-24 DIAGNOSIS — M79604 Pain in right leg: Secondary | ICD-10-CM | POA: Diagnosis present

## 2012-04-24 LAB — BASIC METABOLIC PANEL
BUN: 5 mg/dL — ABNORMAL LOW (ref 6–23)
CO2: 26 mEq/L (ref 19–32)
Chloride: 97 mEq/L (ref 96–112)
GFR calc non Af Amer: >90 mL/min (ref >90)
Glucose, Bld: 86 mg/dL (ref 70–99)
Potassium: 3.7 mEq/L (ref 3.5–5.1)

## 2012-04-24 LAB — CBC WITH DIFFERENTIAL/PLATELET
Eosinophils Absolute: 0.2 10*3/uL (ref 0.0–0.7)
Hemoglobin: 10.8 g/dL — ABNORMAL LOW (ref 12.0–15.0)
Lymphocytes Relative: 12 % (ref 12–46)
Lymphs Abs: 1.4 10*3/uL (ref 0.7–4.0)
MCH: 26.5 pg (ref 26.0–34.0)
Monocytes Relative: 8 % (ref 3–12)
Neutrophils Relative %: 78 % — ABNORMAL HIGH (ref 43–77)
RBC: 4.07 MIL/uL (ref 3.87–5.11)
WBC: 11.7 10*3/uL — ABNORMAL HIGH (ref 4.0–10.5)

## 2012-04-24 MED ORDER — SODIUM CHLORIDE 0.9 % IJ SOLN: 9.0000 mL | INTRAMUSCULAR | Status: DC | PRN

## 2012-04-24 MED ORDER — HYDROMORPHONE HCL PF 1 MG/ML IJ SOLN: 1.0000 mg | INTRAMUSCULAR | Status: DC | PRN

## 2012-04-24 MED ORDER — LEVOFLOXACIN IN D5W 750 MG/150ML IV SOLN
750.0000 mg | INTRAVENOUS | Status: DC
  Administered 2012-04-24 – 2012-04-26 (×3): 750 mg via INTRAVENOUS
  Filled 2012-04-24 (×4): qty 150

## 2012-04-24 MED ORDER — CLINDAMYCIN PHOSPHATE 900 MG/50ML IV SOLN: 900.0000 mg | Freq: Once | INTRAVENOUS | Status: DC

## 2012-04-24 MED ORDER — LIDOCAINE HCL 1 % IJ SOLN
5.0000 mL | Freq: Once | INTRAMUSCULAR | Status: DC
  Filled 2012-04-24: qty 5

## 2012-04-24 MED ORDER — ONDANSETRON HCL 4 MG/2ML IJ SOLN
4.0000 mg | Freq: Four times a day (QID) | INTRAMUSCULAR | Status: DC | PRN
  Administered 2012-04-26: 4 mg via INTRAVENOUS
  Filled 2012-04-24: qty 2

## 2012-04-24 MED ORDER — HEPARIN SODIUM (PORCINE) 5000 UNIT/ML IJ SOLN
5000.0000 [IU] | Freq: Three times a day (TID) | INTRAMUSCULAR | Status: DC
  Administered 2012-04-24 – 2012-04-28 (×11): 5000 [IU] via SUBCUTANEOUS
  Filled 2012-04-24 (×16): qty 1

## 2012-04-24 MED ORDER — SODIUM CHLORIDE 0.9 % IV SOLN
1500.0000 mg | Freq: Two times a day (BID) | INTRAVENOUS | Status: DC
  Administered 2012-04-25 – 2012-04-27 (×5): 1500 mg via INTRAVENOUS
  Filled 2012-04-24 (×8): qty 1500

## 2012-04-24 MED ORDER — ACETAMINOPHEN 325 MG PO TABS: 650.0000 mg | ORAL_TABLET | Freq: Four times a day (QID) | ORAL | Status: DC | PRN

## 2012-04-24 MED ORDER — HYDROMORPHONE HCL PF 1 MG/ML IJ SOLN
1.0000 mg | INTRAMUSCULAR | Status: DC | PRN
  Administered 2012-04-24: 1 mg via INTRAVENOUS
  Filled 2012-04-24 (×2): qty 1

## 2012-04-24 MED ORDER — HYDROMORPHONE HCL PF 1 MG/ML IJ SOLN
1.5000 mg | INTRAMUSCULAR | Status: DC | PRN
  Administered 2012-04-24: 0.5 mg via INTRAVENOUS

## 2012-04-24 MED ORDER — HYDROMORPHONE 0.3 MG/ML IV SOLN
INTRAVENOUS | Status: DC
  Administered 2012-04-24: 22:00:00 via INTRAVENOUS
  Administered 2012-04-25: 4.8 mg via INTRAVENOUS
  Administered 2012-04-25: 1.2 mg via INTRAVENOUS
  Administered 2012-04-25: 3.6 mg via INTRAVENOUS
  Administered 2012-04-25: 1.8 mg via INTRAVENOUS
  Administered 2012-04-25: 07:00:00 via INTRAVENOUS
  Administered 2012-04-25: 2.1 mg via INTRAVENOUS
  Administered 2012-04-26: 1.5 mg via INTRAVENOUS
  Administered 2012-04-26: 1.73 mg via INTRAVENOUS
  Administered 2012-04-26: 0.6 mg via INTRAVENOUS
  Filled 2012-04-24 (×4): qty 25

## 2012-04-24 MED ORDER — ONDANSETRON HCL 4 MG/2ML IJ SOLN: 4.0000 mg | Freq: Three times a day (TID) | INTRAMUSCULAR | Status: DC | PRN

## 2012-04-24 MED ORDER — HYDROMORPHONE HCL PF 1 MG/ML IJ SOLN
1.0000 mg | Freq: Once | INTRAMUSCULAR | Status: AC
  Administered 2012-04-24: 1 mg via INTRAVENOUS
  Filled 2012-04-24: qty 1

## 2012-04-24 MED ORDER — VANCOMYCIN HCL IN DEXTROSE 1-5 GM/200ML-% IV SOLN
1000.0000 mg | Freq: Once | INTRAVENOUS | Status: AC
  Administered 2012-04-24: 1000 mg via INTRAVENOUS
  Filled 2012-04-24: qty 200

## 2012-04-24 MED ORDER — ACETAMINOPHEN 650 MG RE SUPP: 650.0000 mg | Freq: Four times a day (QID) | RECTAL | Status: DC | PRN

## 2012-04-24 MED ORDER — METRONIDAZOLE IN NACL 5-0.79 MG/ML-% IV SOLN
500.0000 mg | Freq: Three times a day (TID) | INTRAVENOUS | Status: DC
  Administered 2012-04-24 – 2012-04-27 (×8): 500 mg via INTRAVENOUS
  Filled 2012-04-24 (×10): qty 100

## 2012-04-24 MED ORDER — ONDANSETRON HCL 4 MG/2ML IJ SOLN
4.0000 mg | Freq: Once | INTRAMUSCULAR | Status: AC
  Administered 2012-04-24: 4 mg via INTRAVENOUS
  Filled 2012-04-24: qty 2

## 2012-04-24 MED ORDER — DIPHENHYDRAMINE HCL 12.5 MG/5ML PO ELIX
12.5000 mg | ORAL_SOLUTION | Freq: Four times a day (QID) | ORAL | Status: DC | PRN
  Filled 2012-04-24: qty 5

## 2012-04-24 MED ORDER — PREDNISONE 50 MG PO TABS
50.0000 mg | ORAL_TABLET | Freq: Every day | ORAL | Status: DC
  Administered 2012-04-25 – 2012-04-26 (×2): 50 mg via ORAL
  Filled 2012-04-24 (×3): qty 1

## 2012-04-24 MED ORDER — DIPHENHYDRAMINE HCL 50 MG/ML IJ SOLN: 12.5000 mg | Freq: Four times a day (QID) | INTRAMUSCULAR | Status: DC | PRN

## 2012-04-24 MED ORDER — SODIUM CHLORIDE 0.9 % IV SOLN
INTRAVENOUS | Status: DC
  Administered 2012-04-24: 18:00:00 via INTRAVENOUS
  Administered 2012-04-25: 125 mL/h via INTRAVENOUS
  Administered 2012-04-26: 12:00:00 via INTRAVENOUS
  Administered 2012-04-27: 1000 mL via INTRAVENOUS

## 2012-04-24 MED ORDER — DOCUSATE SODIUM 100 MG PO CAPS
100.0000 mg | ORAL_CAPSULE | Freq: Two times a day (BID) | ORAL | Status: DC | PRN
  Administered 2012-04-26 (×3): 100 mg via ORAL
  Filled 2012-04-24 (×3): qty 1

## 2012-04-24 MED ORDER — NALOXONE HCL 0.4 MG/ML IJ SOLN: 0.4000 mg | INTRAMUSCULAR | Status: DC | PRN

## 2012-04-24 NOTE — Progress Notes (Signed)
I have seen and examined this patient tonight and discussed with Drs Pollie Meyer and Ashley Royalty.  I agree with their management including broad spectrum antibiotics, systemic steroids and narcotic analgesia.  She has a secondary cellulitis.  My working diagnosis for the primary skin problem is dyshidrotic eczema.  I reached that conclusion because of her hand involvement.  I will co-sign the H&PE when it is available.

## 2012-04-24 NOTE — ED Notes (Signed)
Report given to floor and [facility]. Awaiting for [facility] to transfer pt.

## 2012-04-24 NOTE — Progress Notes (Signed)
PT STATES THAT SHE FELL IN HER MOMS YARD THIS PAST Friday, THAT HER FEET "JUST GAVE OUT."

## 2012-04-24 NOTE — Progress Notes (Signed)
ARRIVED FROM [facility] ED VIA [facility]. DENIES NAUSEA, C/O BILATERAL FOOT PAIN 10/10. TEACHING SERVICE NOTIFIED, RECEIVED ORDER FOR PAIN MED. ORIENTED TO ROOM AND SURROUNDINGS.

## 2012-04-24 NOTE — ED Notes (Signed)
Pt presents to ED with c/o pain and wounds on bilat. Feet that started in August with athlete's foot. Pt sts she went to wound center this am and was sent here after having her feet dressed. Pt sts pain 10/10 and burning sensation in both feet.

## 2012-04-24 NOTE — ED Notes (Addendum)
Patient c/o recurrent infections in bilateral feet.  Patient is not diabetic.  Patient states she was first diagnosed with athlete's foot and then diagnosed with cellulitis and exzema.  Patient in extreme pain.  Patient has both feet dressed on arrival.  Patient denies fevers, chills, N/V/D.

## 2012-04-24 NOTE — ED Notes (Signed)
[facility] here to transfer pt. 

## 2012-04-24 NOTE — ED Provider Notes (Signed)
History     CSN: [id number]  Arrival date & time 04/24/12  1114   First MD Initiated Contact with Patient 04/24/12 1130      Chief Complaint  Patient presents with  . Recurrent Skin Infections    bilateral feet    (Consider location/radiation/quality/duration/timing/severity/associated sxs/prior treatment) HPI Comments: Patient presents with a four-month history of bilateral foot infections. She was diagnosed with eczema and superficial infection and treated with topical steroids and antibiotics. Nothing has seemed to be working. Symptoms are gradually getting worse. Patient was seen at wound care today and had an abscess drained and was told to come to the emergency department for further evaluation and probable admission for IV antibiotics. Patient does not have a history of diabetes. She's been taking oxycodone with minimal relief. She denies fever, nausea or vomiting. Pain is worse with palpation or walking. Onset gradual.  The history is provided by the patient.    Past Medical History  Diagnosis Date  . Scoliosis   . Complication of anesthesia     difficult waking up "  . Chest pain   . Depression   . Chronic back pain greater than 3 months duration   . Eczema   . Cellulitis     Past Surgical History  Procedure Date  . Tubal ligation     History reviewed. No pertinent family history.  History  Substance Use Topics  . Smoking status: Former Smoker -- 14 years    Quit date: 09/20/2010  . Smokeless tobacco: Never Used  . Alcohol Use: No    OB History    Grav Para Term Preterm Abortions TAB SAB Ect Mult Living                  Review of Systems  Constitutional: Negative for fever.  HENT: Negative for sore throat and rhinorrhea.   Eyes: Negative for redness.  Respiratory: Negative for cough.   Cardiovascular: Negative for chest pain.  Gastrointestinal: Negative for nausea, vomiting, abdominal pain and diarrhea.  Genitourinary: Negative for dysuria.   Musculoskeletal: Negative for myalgias.  Skin: Positive for color change and wound. Negative for rash.  Neurological: Negative for headaches.    Allergies  Amoxicillin and Tramadol  Home Medications   Current Outpatient Rx  Name  Route  Sig  Dispense  Refill  . ACETAMINOPHEN 500 MG PO TABS   Oral   Take 1,000 mg by mouth every 6 (six) hours as needed. For pain         . CLOBETASOL PROPIONATE 0.05 % EX CREA   Topical   Apply 1 application topically 2 (two) times daily. Applied to feet.         . ADULT MULTIVITAMIN W/MINERALS CH   Oral   Take 1 tablet by mouth daily.         . OXYCODONE-ACETAMINOPHEN 5-325 MG PO TABS   Oral   Take 2 tablets by mouth every 6 (six) hours as needed for pain.   15 tablet   0   . TERBINAFINE HCL 250 MG PO TABS   Oral   Take 250 mg by mouth 2 (two) times daily.           BP 151/77  Pulse 80  Temp 98.4 F (36.9 C) (Oral)  Resp 16  SpO2 100%  LMP 03/31/2012  Physical Exam  Nursing note and vitals reviewed. Constitutional: She appears well-developed and well-nourished.  HENT:  Head: Normocephalic and atraumatic.  Eyes: Conjunctivae normal are  normal. Right eye exhibits no discharge. Left eye exhibits no discharge.  Neck: Normal range of motion. Neck supple.  Cardiovascular: Normal rate, regular rhythm and normal heart sounds.   Pulses:      Dorsalis pedis pulses are 2+ on the right side, and 2+ on the left side.       Posterior tibial pulses are 2+ on the right side, and 2+ on the left side.  Pulmonary/Chest: Effort normal and breath sounds normal.  Abdominal: Soft. There is no tenderness.  Neurological: She is alert.  Skin: Skin is warm and dry.       Bilateral feet with thickened skin, emaciated, several areas where epidermis is removed. There is induration overlying the left third metatarsal on the sole of the foot that was recently incised. There is a small amount of blood draining from this wound. Findings extend to the  inferior portion of the ankle. Sensation and motor appear intact.  Psychiatric: She has a normal mood and affect.    ED Course  Procedures (including critical care time)  Labs Reviewed  CBC WITH DIFFERENTIAL - Abnormal; Notable for the following:    WBC 11.7 (*)     Hemoglobin 10.8 (*)     HCT 32.3 (*)     Neutrophils Relative 78 (*)     Neutro Abs 9.2 (*)     All other components within normal limits  BASIC METABOLIC PANEL - Abnormal; Notable for the following:    Sodium 134 (*)     BUN 5 (*)     All other components within normal limits   Dg Foot Complete Left  04/24/2012  *RADIOLOGY REPORT*  Clinical Data: Recurrent infections, open sores  LEFT FOOT - COMPLETE 3+ VIEW  Comparison: None.  Findings: Tarsal - metatarsal alignment is normal.  Joint spaces appear normal.  No erosion or demineralization is seen.  IMPRESSION: Negative.   Original Report Authenticated By: Dwyane Dee, M.D.    Dg Foot Complete Right  04/24/2012  *RADIOLOGY REPORT*  Clinical Data: Recurrent infection with open sores  RIGHT FOOT COMPLETE - 3+ VIEW  Comparison: None.  Findings: Tarsal - metatarsal alignment is normal.  No bony erosion or demineralization is seen.  Joint spaces appear normal.  IMPRESSION: Negative.   Original Report Authenticated By: Dwyane Dee, M.D.      1. Cellulitis and abscess     12:00 PM Patient seen and examined. Wound care and PCP notes reviewed. Work-up initiated. Medications ordered. D/w Dr. Anitra Lauth. Will admit when work-up is complete.   Vital signs reviewed and are as follows: Filed Vitals:   04/24/12 1142  BP: 151/77  Pulse: 80  Temp: 98.4 F (36.9 C)  Resp: 16   1:21 PM Patient informed of results. Pain is improved. Spoke with [facility] who will admit at [facility]. Patient agrees with plan.    MDM  Cellulitis, failure of outpatient treatment. Needs admit for IV abx.         Renne Crigler, [place] 04/24/12 1322

## 2012-04-24 NOTE — H&P (Signed)
Family Medicine Teaching [facility] Admission History and Physical Service Pager: [phone]  Patient name: Susan Sanders Medical record number: [id number] Date of birth: October 31, 1970 Age: 41 y.o. Gender: female  Primary Care Provider: Majel Homer, MD  Chief Complaint: bilateral foot wounds  Assessment and Plan: Susan Sanders is a 41 y.o. year old female presenting with bilateral diffuse foot rash of unclear etiology. Differential diagnosis includes infection (both local and systemic including cellulitis, HIV, secondary syphilis), contact dermatitis, autoimmune disease (pemphigus vulgaris vs. Bullous pemphigoid), and dyshidrotic eczema. Will admit patient for IV antibiotics and testing to determine etiology.  # Chronic bilateral foot wounds: -With hand involvement as well would lean more towards dyshidrotic eczema with secondary infection as working diagnosis.  - Check labs: culture of nodule on left foot, HIV, RPR, sed rate, ANA - Plan for punch biopsy to help determine etiology - Will initiate 2 week course of oral prednisone 50mg  daily as this will likely help if autoimmune or dyshidrotic eczema - Patient has hx of allergy to amoxicillin, so will avoid penicillins. Will treat with IV vancomycin, levaquin and flagyl to cover for MRSA and other gram positives, anaerobes, and gram negatives including pseudomonas.  - Consult wound care team. - If not improving consider inpatient dermatology consult. # FEN/GI: - NS @ 125 cc/hr - Regular diet  # Dispo:  - pending clinical improvement  # Code Status:  - did not address, but full code for now  History of Present Illness: Susan Sanders is a 41 y.o. year old female who presented earlier today to the [facility] ED with chronic recurrent wounds of her bilateral feet.   She was seen in clinic on 12/10 by Dr. Gwendolyn Grant, who noted maceration of her foot skin with bullae. He started her on a short course of clobetasol, and patient was to see wound care  in two days. This morning she went to the wound care clinic and, after having her feet dressed, was sent to the ER for admission for IV antibiotics.  She has had these wounds for several months (since August) and been seen in clinic several times for them. In August a culture (presumably from her feet) grew MRSA. She has been treated with doxycycline, terbinafine, and triamcinolone. She has intermittent improvement and subsequent worsening of her feet. They became increasingly painful and began looking worse on Friday 04/18/12. They are blistering with open sores, and they both burn and itch. She feels the terbinafine has helped some.  This all initially started in August after she stepped on a small piece of glass. It began in her right foot and within a week had traveled to her left as well. The left foot hurts worse than the right. The wounds are pruritic and painful. They have drained clear fluid, bloody fluid, and pus.   No new soaps, pedicures. She wears [employer] and has tried getting some new "nursing shoes" to see if that would help. She does not have pain in her legs. She has not noticed any blistering anywhere else, just on her feet. It started on the right foot and traveled to the other. She had a fever to 100.1 at home. She has had some nausea and had a decreased appetite.  In the ED she received one dose of IV vancomycin. The [facility]PT[facility] was called for admission, and she was transferred to [facility].  Review Of Systems: Per HPI. Otherwise unremarkable. Patient denies chest pain, headache, vision changes, shortness of breath, sore  throat, problems with bowel or bladder, or any other concerns.  Patient Active Problem List  Diagnosis  . Anxiety and depression  . TOBACCO USER  . MIGRAINE HEADACHE  . ALLERGIC RHINITIS  . DYSFUNCTIONAL UTERINE BLEEDING  . Back pain  . Dyshidrotic eczema  . Toe infection  . Foot pain   Past Medical History: Past Medical History  Diagnosis Date  .  Scoliosis   . Complication of anesthesia     difficult waking up "  . Chest pain   . Depression   . Chronic back pain greater than 3 months duration   . Eczema   . Cellulitis    Past Surgical History: Past Surgical History  Procedure Date  . Tubal ligation     Home Medications: Vitamin Lamisil Percocet (recently rx'd in our clinic for feet pain)  Social History: She works at [employer] as a Lawyer. Lives in [place] with her mom. Does not smoke currently. No alcohol or drug use.  For any additional social history documentation, please refer to relevant sections of EMR.  Family History: History reviewed. No pertinent family history.  Allergies: Allergies  Allergen Reactions  . Amoxicillin Itching and Swelling  . Tramadol     Light headed    Physical Exam: BP 151/77  Pulse 80  Temp 98.4 F (36.9 C) (Oral)  Resp 16  SpO2 100%  LMP 03/31/2012 Exam: General: appears very uncomfortable, crying in pain initially, then calms down and gets sleepy a few minutes after getting dilaudid HEENT: no oral lesions seen. Normocephalic/atraumatic. Cardiovascular: RRR, no murmurs/rubs/gallops Respiratory: normal respiratory effort Abdomen: soft, nontender, nondistended, no masses palpable. normal bowel sounds Extremities: left shin has a ~1.5 cm draining abscess which is covered by a bandage, without surrounding erythema, but which does have some slight induration and slight fluctuance. Feet are initially each wrapped in gauze. Left and right feet with blistering maculopapular rash covering plantar and dorsal surfaces. The left foot has a ~2cm open nodule that is draining purulent material. Bilateral palms have darkened macular spots with some peeling of the skin. Nikolsky's sign is negative. (see images below) Neuro: nonfocal, speech intact, oriented to person, place, and time        Left Foot: Dorsal    Left Foot: Plantar      Right Foot: Dorsal  Right Foot: Plantar        Labs and Imaging:  CBC:    Component Value Date/Time   WBC 11.7* 04/24/2012 1150   HGB 10.8* 04/24/2012 1150   HCT 32.3* 04/24/2012 1150   PLT 335 04/24/2012 1150   MCV 79.4 04/24/2012 1150   NEUTROABS 9.2* 04/24/2012 1150   LYMPHSABS 1.4 04/24/2012 1150   MONOABS 0.9 04/24/2012 1150   EOSABS 0.2 04/24/2012 1150   BASOSABS 0.0 04/24/2012 1150    Basic Metabolic Panel:    Component Value Date/Time   NA 134* 04/24/2012 1150   K 3.7 04/24/2012 1150   CL 97 04/24/2012 1150   CO2 26 04/24/2012 1150   BUN 5* 04/24/2012 1150   CREATININE 0.67 04/24/2012 1150   CREATININE 0.72 03/27/2012 1206   GLUCOSE 86 04/24/2012 1150   CALCIUM 9.5 04/24/2012 1150    Plain film left foot 04/24/12:  Tarsal - metatarsal alignment is normal. Joint spaces appear normal. No erosion or demineralization is seen.  Plan film right foot 04/24/12: Tarsal - metatarsal alignment is normal. No bony erosion or demineralization is seen. Joint spaces appear normal.   Levert Feinstein, MD Family  Medicine PGY-1  PGY-3 Addendum  I have seen and examined patient with Dr. Pollie Meyer and agree with exam and plan as outlined above with the following additions in Red

## 2012-04-24 NOTE — ED Provider Notes (Signed)
Medical screening examination/treatment/procedure(s) were performed by non-physician practitioner and as supervising physician I was immediately available for consultation/collaboration.   Gwyneth Sprout, MD 04/24/12 1447

## 2012-04-24 NOTE — Progress Notes (Signed)
ANTIBIOTIC CONSULT NOTE - INITIAL  Pharmacy Consult for Vancomycin, Levaquin, Flagyl Indication: bilateral lower extremity cellulitis  Allergies  Allergen Reactions  . Amoxicillin Itching and Swelling  . Tramadol     Light headed    Patient Measurements:   Wt Readings from Last 3 Encounters:  04/22/12 157 lb (71.215 kg)  03/28/12 153 lb 11.2 oz (69.718 kg)  03/20/12 156 lb (70.761 kg)   Vital Signs: Temp: 98.5 F (36.9 C) (12/12 1547) Temp src: Oral (12/12 1547) BP: 141/76 mmHg (12/12 1547) Pulse Rate: 72  (12/12 1547) Intake/Output from previous day:   Intake/Output from this shift:    Labs:  Basename 04/24/12 1150  WBC 11.7*  HGB 10.8*  PLT 335  LABCREA --  CREATININE 0.67   The CrCl is unknown because both a height and weight (above a minimum accepted value) are required for this calculation. No results found for this basename: VANCOTROUGH:2,VANCOPEAK:2,VANCORANDOM:2,GENTTROUGH:2,GENTPEAK:2,GENTRANDOM:2,TOBRATROUGH:2,TOBRAPEAK:2,TOBRARND:2,AMIKACINPEAK:2,AMIKACINTROU:2,AMIKACIN:2, in the last 72 hours   Microbiology: No results found for this or any previous visit (from the past 720 hour(s)).  Medical History: Past Medical History  Diagnosis Date  . Scoliosis   . Complication of anesthesia     difficult waking up "  . Chest pain   . Depression   . Chronic back pain greater than 3 months duration   . Eczema   . Cellulitis     Medications:  Anti-infectives     Start     Dose/Rate Route Frequency Ordered Stop   04/24/12 1215   vancomycin (VANCOCIN) IVPB 1000 mg/200 mL premix        1,000 mg 200 mL/hr over 60 Minutes Intravenous  Once 04/24/12 1202 04/24/12 1350   04/24/12 1200   clindamycin (CLEOCIN) IVPB 900 mg  Status:  Discontinued        900 mg 100 mL/hr over 30 Minutes Intravenous  Once 04/24/12 1200 04/24/12 1202         Assessment: 41 year old female admitted with bilateral lower extremity cellulitis.  She has not responded to multiple  courses of antibiotics.  She received doses of Vancomycin and Clindamycin in the [place] ED prior to transfer.  Goal of Therapy:  Vancomycin trough level 10-15 mcg/ml  Plan:  Vancomycin 1500mg  IV q12h - first dose at 8pm Levaquin 750mg  IV q24h Flagyl 500mg  IV q8h Follow for clinical improvement, antibiotic de-escalation when able.  Estella Husk, Pharm.D., BCPS Clinical Pharmacist  Phone [phone] Pager [phone] 04/24/2012, 5:38 PM

## 2012-04-25 ENCOUNTER — Encounter (HOSPITAL_COMMUNITY): Payer: Self-pay | Admitting: General Practice

## 2012-04-25 LAB — CBC WITH DIFFERENTIAL/PLATELET
Basophils Absolute: 0 10*3/uL (ref 0.0–0.1)
Basophils Relative: 0 % (ref 0–1)
Eosinophils Absolute: 0.4 10*3/uL (ref 0.0–0.7)
Eosinophils Relative: 4 % (ref 0–5)
HCT: 30.6 % — ABNORMAL LOW (ref 36.0–46.0)
Hemoglobin: 10.2 g/dL — ABNORMAL LOW (ref 12.0–15.0)
Lymphocytes Relative: 11 % — ABNORMAL LOW (ref 12–46)
Lymphs Abs: 1.1 10*3/uL (ref 0.7–4.0)
MCH: 27.1 pg (ref 26.0–34.0)
MCHC: 33.3 g/dL (ref 30.0–36.0)
MCV: 81.4 fL (ref 78.0–100.0)
Monocytes Absolute: 0.7 10*3/uL (ref 0.1–1.0)
Monocytes Relative: 7 % (ref 3–12)
Neutro Abs: 7.8 10*3/uL — ABNORMAL HIGH (ref 1.7–7.7)
Neutrophils Relative %: 78 % — ABNORMAL HIGH (ref 43–77)
Platelets: 315 10*3/uL (ref 150–400)
RBC: 3.76 MIL/uL — ABNORMAL LOW (ref 3.87–5.11)
RDW: 14.2 % (ref 11.5–15.5)
WBC: 10.1 10*3/uL (ref 4.0–10.5)

## 2012-04-25 LAB — RPR: RPR Ser Ql: NONREACTIVE

## 2012-04-25 LAB — ANA: Anti Nuclear Antibody(ANA): NEGATIVE

## 2012-04-25 MED ORDER — HYDROMORPHONE HCL PF 1 MG/ML IJ SOLN
INTRAMUSCULAR | Status: AC
  Administered 2012-04-25: 1 mg
  Filled 2012-04-25: qty 1

## 2012-04-25 MED ORDER — HYDROMORPHONE HCL PF 1 MG/ML IJ SOLN: 1.0000 mg | Freq: Once | INTRAMUSCULAR | Status: AC

## 2012-04-25 MED ORDER — TERBINAFINE HCL 250 MG PO TABS
250.0000 mg | ORAL_TABLET | Freq: Every day | ORAL | Status: DC
  Administered 2012-04-25 – 2012-04-27 (×3): 250 mg via ORAL
  Filled 2012-04-25 (×6): qty 1

## 2012-04-25 NOTE — Consult Note (Signed)
WOC consult Note Reason for Consult: Consult requested for bilat feet.  Pt has been followed as outpatient for eczema condition to feet and hands and applies a prescription cream BID.  She is currently unable to name this cream.  Bilat toes dusky brown, weeping mod yellow drainage, some odor, macerated to plantar surface of feel and toes and beginning to peel in patchy areas; revealing moist red wound bed.  Left plantar foot with 3cm raised "boil" with small thick tan drainage oozing from center.  Very painful to touch.  Dressing procedure/placement/frequency: Xerofrom gauze to keep dressings from sticking to macerated wound areas.  Cover with kerlex.  PT WILL NEED I&D of left plantar foot.  This is beyond [facility] scope of practice.  Needs prescription cream applied to BLE for eczma. Primary team at bedside to discuss plan of care.  They also plan to do a punch biopsy.  Will not plan to follow further unless re-consulted.  [address], RN, MSN, [employer]  [phone]

## 2012-04-25 NOTE — Progress Notes (Signed)
PGY-1 Daily Progress Note Family Medicine Teaching Service [place] R. Deantre Bourdon, DO Service Pager: [phone]   Subjective: Pt in some pain and not feeling well.  Having new nausea episodes she thinks is related to the dilaudid PCA.  Otherwise no complaints.    Objective:  VITALS Temp:  [98.2 F (36.8 C)-98.6 F (37 C)] 98.4 F (36.9 C) (12/13 0559) Pulse Rate:  [72-97] 97  (12/13 0559) Resp:  [13-20] 13  (12/13 0713) BP: (141-163)/(74-84) 147/84 mmHg (12/13 0559) SpO2:  [96 %-100 %] 97 % (12/13 0713)  In/Out  Intake/Output Summary (Last 24 hours) at 04/25/12 0902 Last data filed at 04/25/12 0700  Gross per 24 hour  Intake   1339 ml  Output      0 ml  Net   1339 ml    Physical Exam: General: in mild distress HEENT: no oral lesions seen. Normocephalic/atraumatic.  Cardiovascular: RRR, no murmurs/rubs/gallops  Respiratory: normal respiratory effort  Abdomen: soft, nontender, nondistended, no masses palpable. normal bowel sounds  Extremities: left shin has a ~1.5 cm draining abscess (bandaged). Feet are initially each wrapped. Nikolsky's sign is negative.  + for multiple non draining vesicles on both dorsal and palmar aspects of B/L hands.  Neuro: nonfocal, speech intact, oriented to person, place, and time   MEDS Scheduled Meds:   . heparin  5,000 Units Subcutaneous Q8H  . HYDROmorphone PCA 0.3 mg/mL   Intravenous Q4H  . levofloxacin (LEVAQUIN) IV  750 mg Intravenous Q24H  . lidocaine  5 mL Infiltration Once  . metronidazole  500 mg Intravenous Q8H  . predniSONE  50 mg Oral Q breakfast  . vancomycin  1,500 mg Intravenous Q12H   Continuous Infusions:   . sodium chloride 125 mL/hr at 04/25/12 0700   PRN Meds:.acetaminophen, acetaminophen, diphenhydrAMINE, diphenhydrAMINE, docusate sodium, naloxone, ondansetron (ZOFRAN) IV, sodium chloride  Labs and imaging:   CBC  Lab 04/24/12 1150  WBC 11.7*  HGB 10.8*  HCT 32.3*  PLT 335   BMET/CMET  Lab 04/24/12 1150  NA  134*  K 3.7  CL 97  CO2 26  BUN 5*  CREATININE 0.67  CALCIUM 9.5  PROT --  BILITOT --  ALKPHOS --  ALT --  AST --  GLUCOSE 86   Results for orders placed during the hospital encounter of 04/24/12 (from the past 24 hour(s))  CBC WITH DIFFERENTIAL     Status: Abnormal   Collection Time   04/24/12 11:50 AM      Component Value Range   WBC 11.7 (*) 4.0 - 10.5 K/uL   RBC 4.07  3.87 - 5.11 MIL/uL   Hemoglobin 10.8 (*) 12.0 - 15.0 g/dL   HCT 45.4 (*) 09.8 - 11.9 %   MCV 79.4  78.0 - 100.0 fL   MCH 26.5  26.0 - 34.0 pg   MCHC 33.4  30.0 - 36.0 g/dL   RDW 14.7  82.9 - 56.2 %   Platelets 335  150 - 400 K/uL   Neutrophils Relative 78 (*) 43 - 77 %   Neutro Abs 9.2 (*) 1.7 - 7.7 K/uL   Lymphocytes Relative 12  12 - 46 %   Lymphs Abs 1.4  0.7 - 4.0 K/uL   Monocytes Relative 8  3 - 12 %   Monocytes Absolute 0.9  0.1 - 1.0 K/uL   Eosinophils Relative 2  0 - 5 %   Eosinophils Absolute 0.2  0.0 - 0.7 K/uL   Basophils Relative 0  0 - 1 %  Basophils Absolute 0.0  0.0 - 0.1 K/uL  BASIC METABOLIC PANEL     Status: Abnormal   Collection Time   04/24/12 11:50 AM      Component Value Range   Sodium 134 (*) 135 - 145 mEq/L   Potassium 3.7  3.5 - 5.1 mEq/L   Chloride 97  96 - 112 mEq/L   CO2 26  19 - 32 mEq/L   Glucose, Bld 86  70 - 99 mg/dL   BUN 5 (*) 6 - 23 mg/dL   Creatinine, Ser 4.09  0.50 - 1.10 mg/dL   Calcium 9.5  8.4 - 81.1 mg/dL   GFR calc non Af Amer >90  >90 mL/min   GFR calc Af Amer >90  >90 mL/min  SEDIMENTATION RATE     Status: Abnormal   Collection Time   04/24/12  5:21 PM      Component Value Range   Sed Rate 24 (*) 0 - 22 mm/hr  RPR     Status: Normal   Collection Time   04/24/12  5:21 PM      Component Value Range   RPR NON REACTIVE  NON REACTIVE  HIV ANTIBODY (ROUTINE TESTING)     Status: Normal   Collection Time   04/24/12  5:21 PM      Component Value Range   HIV NON REACTIVE  NON REACTIVE  WOUND CULTURE     Status: Normal (Preliminary result)    Collection Time   04/24/12  5:31 PM      Component Value Range   Specimen Description WOUND FOOT LEFT     Special Requests NONE     Gram Stain PENDING     Culture NO GROWTH 1 DAY     Report Status PENDING     Dg Foot Complete Left  04/24/2012  *RADIOLOGY REPORT*  Clinical Data: Recurrent infections, open sores  LEFT FOOT - COMPLETE 3+ VIEW  Comparison: None.  Findings: Tarsal - metatarsal alignment is normal.  Joint spaces appear normal.  No erosion or demineralization is seen.  IMPRESSION: Negative.   Original Report Authenticated By: Dwyane Dee, M.D.    Dg Foot Complete Right  04/24/2012  *RADIOLOGY REPORT*  Clinical Data: Recurrent infection with open sores  RIGHT FOOT COMPLETE - 3+ VIEW  Comparison: None.  Findings: Tarsal - metatarsal alignment is normal.  No bony erosion or demineralization is seen.  Joint spaces appear normal.  IMPRESSION: Negative.   Original Report Authenticated By: Dwyane Dee, M.D.         Assessment  Pt is a 41 y.o. 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   1) Dyshidrotic eczema on B/L hands and Feet with Left foot abscess    1)Emolionts placed to the area qd.  Sterile  2) Labs negative for HIV/RPR and ANA.  ESR at 24  3) Punch Bx performed and sent to histology for examination  4) I & D of Left foot abscess and sent for fungal and bacterial wound culture.   5) continue Prednisone 50 mg qd   6) Will treat with IV vancomycin, levaquin and flagyl to cover for MRSA and other gram positives, anaerobes, and gram negatives including pseudomonas.   7) Consider Derm consult  FEN/GI   1) NS @ 125 cc/hr   2)Regular diet  Dispo  1)pending clinical improvement    Maitland Lesiak R. Anabell Swint, DO of  [facility] 04/25/2012, 1:11 PM

## 2012-04-25 NOTE — Progress Notes (Signed)
Family Medicine Teaching Service Attending Note  I interviewed and examined patient Susan Sanders and reviewed their tests and x-rays.  I discussed with Dr. Paulina Fusi and reviewed their note for today.  I agree with their assessment and plan.     Additionally  She is tearful and very anxious but calms with conversation and reassurance Bilateral left > R vesicular purulent palmar surface of both feet.  Scattered papular lesions on hands  I think this is more consistent with severe vesicular tinea pedis with id reaction than dyshydrotic eczema.  It may be secondarily infected but given lack of systemic symptoms I do not think we need iv antibiotics for more than a day unless these develop. Agree with culture and histo for bacterial and fungal

## 2012-04-25 NOTE — Progress Notes (Signed)
[facility]  NAMESANIYAH, Susan Sanders                  ACCOUNT NO.:  [id number]  MEDICAL RECORD NO.:  [id number]      DATE OF BIRTH:  1970-09-08  PHYSICIAN:  Maxwell Caul, M.D. VISIT DATE:  04/24/2012                                  OFFICE VISIT   Susan Sanders is a 41 year old woman who was kindly referred from [facility].  She is here for wounds on her bilateral feet. The history is a bit difficult to follow.  The patient tells me that she had problems with her right heel and right great toe back in August. Indeed the notes that came with her suggested methicillin-resistant Staph aureus infection I believe from the toe at that time, she was treated with doxycycline and foot soaks and there was some improvement, although according to her, this never really resolved.  She was also treated for what appears to be bullous skin problems on her feet through November.  She has described then on March 27, 2012, as having multiple bullae on the bottom of her right foot.  There was nothing on the left.  Over the past week, things have really deteriorated.  She is having intense pain in her right forefoot and also on the left 3rd metatarsal head.  She has also developed swelling on the left leg.  She is not a diabetic.  She tells me she has no underlying medical issues, was on no medications before July.  She has no prior history of skin problems in general or specifically on her feet.  CURRENT MEDICATIONS:  Hydrocodone p.r.n. and terbinafine.  ALLERGIES:  AMOXICILLIN and TRAMADOL.  PHYSICAL EXAMINATION:  Her temperature is 98.5, pulse 85, respirations 18, blood pressure 136/86.  The patient looks very uncomfortable.  Her dorsalis pedis and posterior tibial pulses are easily palpable bilaterally.  Her bilateral feet are examined.  There appears to be tinea infection between her toes, most evidently on the left.  Both feet appear to be in some  degree of compromise.  There is intense swelling and pain of the right forefoot.  There appears to be an abscess over the left 3rd metatarsal head that was cultured.  There is also 1 on the left lateral leg.  IMPRESSION:  Probable bilateral cellulitis.  I would think this is most likely MRSA complicating tinea pedis.  There are primary skin conditions that can affect predominantly the soles of the feet, although I can put none of this together with a presentation like this.  I suspect she had underlying tinea pedis that has been secondarily infected by Staph.  I am basing this on a Staph and MRSA culture from August.  Things have really deteriorated.  The patient is in severe pain, cannot walk, cannot work.  I think she needs admission to hospital for IV antibiotics, imaging of her feet possibly with an MRI.  I could not think of a combination of factors that would make outpatient management safe here.  I have therefore referred her to the emergency room.          ______________________________ Maxwell Caul, M.D.     MGR/MEDQ  D:  04/24/2012  T:  04/25/2012  Job:  [id number]

## 2012-04-25 NOTE — H&P (Signed)
Seen and examined yesterday.  Also discussed with residents.  I agree with their documentation and management.  See my note from yesterday.  41 year old woman with primary skin problem affecting soles of feet to a major extent and palms to a lesser extent.  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  The reason for her admit is that she has a secondary infection/cellulitis of both feet.

## 2012-04-25 NOTE — Procedures (Signed)
Incision and Drainage Procedure Note  Pre-operative Diagnosis: Left Foot Abscess  Post-operative Diagnosis: same  Indications: Infection (Fungal vs Bacterial )  Anesthesia: 1% plain lidocaine  Procedure Details  The procedure, risks and complications have been discussed in detail (including, but not limited to airway compromise, infection, bleeding) with the patient, and the patient has signed consent to the procedure.  The skin was sterilely prepped and draped over the affected area in the usual fashion. After adequate local anesthesia, I&D with a #11 blade was performed on the left foot. Purulent drainage: present The patient was observed until stable.  Findings: Pustular material expunged from the wound and washed with NS.    EBL: 2 cc's  Drains: None  Condition: Tolerated procedure well   Complications: none.  Twana First Paulina Fusi, DO of [facility] 04/25/2012, 1:08 PM

## 2012-04-25 NOTE — Progress Notes (Signed)
PCP Note: Appreciate care of FPTS.  Patient has a somewhat confusing history and i appreciate any insight they can offer.

## 2012-04-25 NOTE — Procedures (Signed)
Punch Biopsy   Pre-operative Diagnosis: Blistering Skin condition with eruption  Post-operative Diagnosis: same  Indications: Histology examination of tissue   Anesthesia: 1% plain lidocaine  Procedure Details  The procedure, risks and complications have been discussed in detail (including, but not limited to airway compromise, infection, bleeding) with the patient, and the patient has signed consent to the procedure.  The skin was sterilely prepped and draped over the affected area in the usual fashion. After adequate local anesthesia, a 3 mm punch biopsy instrument was inserted into the left plantar aspect of her foot near the heel.  A full thickness specimen was extracted and sent for histologic examination.  Pressure was applied to the area until bleeding stopped and a sterile dressing was placed to the area.   The patient was observed until stable.  Findings: Pending   EBL: 1 cc's  Drains: None  Condition: Tolerated procedure well   Complications: none.   Twana First Paulina Fusi, DO of [facility] 04/25/2012, 1:08 PM

## 2012-04-26 ENCOUNTER — Inpatient Hospital Stay (HOSPITAL_COMMUNITY): Payer: BC Managed Care – PPO

## 2012-04-26 MED ORDER — POLYETHYLENE GLYCOL 3350 17 G PO PACK
17.0000 g | PACK | Freq: Every day | ORAL | Status: DC
  Administered 2012-04-26 – 2012-04-28 (×3): 17 g via ORAL
  Filled 2012-04-26 (×3): qty 1

## 2012-04-26 MED ORDER — MORPHINE SULFATE 2 MG/ML IJ SOLN
2.0000 mg | Freq: Once | INTRAMUSCULAR | Status: AC
  Administered 2012-04-26: 2 mg via INTRAVENOUS
  Filled 2012-04-26: qty 1

## 2012-04-26 MED ORDER — OXYCODONE HCL 5 MG PO TABS
10.0000 mg | ORAL_TABLET | Freq: Four times a day (QID) | ORAL | Status: DC | PRN
  Administered 2012-04-26 – 2012-04-28 (×6): 10 mg via ORAL
  Filled 2012-04-26 (×7): qty 2

## 2012-04-26 MED ORDER — PREDNISONE 20 MG PO TABS
20.0000 mg | ORAL_TABLET | Freq: Every day | ORAL | Status: DC
  Administered 2012-04-27 – 2012-04-28 (×2): 20 mg via ORAL
  Filled 2012-04-26 (×4): qty 1

## 2012-04-26 NOTE — Progress Notes (Signed)
Called by patient to her room. She was crying complaining of severe pain on her left side/hip area. Noted some swelling and tender on palpation. Patient said she fell on her left side this morning while trying to get to the potty. She said she did not tell anybody about it. No other injuries noted, no active bleeding, pt said she did not hit her head. Paged MD on call at [phone].  No new orders at this time. She will have somebody come and check on the patient later this evening. PRN pain medication given. Will continue to monitor.

## 2012-04-26 NOTE — Progress Notes (Signed)
We were called to evaluate pt due to reported pain on left hip after a unwitnessed fall pt states had this morning. Pt is tearful and reports sharp pain 7/10 on back portion of left hip. Physical exam: NAD: laying on the right side. NAD MSK: Tenderness to palpation on left sacroiliac area. ROM limited by reported pain.  Plan: XR of left hip. Will continue to manage pain.    D. Piloto Rolene Arbour, MD Family Medicine  PGY-2

## 2012-04-26 NOTE — Progress Notes (Signed)
Family Medicine Teaching Service Attending Note  I interviewed and examined patient Susan Sanders and reviewed their tests and x-rays.  I discussed with Dr. Aviva Signs and reviewed their note for today.  I agree with their assessment and plan.     Additionally  No systemic sxs of infection Change to oral antibiotics except give Vancomycin IV another 24hrs Given emotional lability decrease prednisone to 40 mg daily Change to oral analgesics Decrease IVF  May need small abscess on left shin I&D if not better in AM

## 2012-04-26 NOTE — Progress Notes (Signed)
Daily Progress Note Family Medicine Teaching Service PGY-2   Patient name: Susan Sanders  Medical record [id number] Date of birth:[birth year] Age: 41 y.o. Gender: female  LOS: 2 days   Subjective: Pt tearful and reports to be upset about procedure she consented to had done yesterday. Pain is controlled. Afebrile. Good UOP.  Denies other symptoms.  Objective: Vitals: Filed Vitals:   04/26/12 0500  BP: 155/69  Pulse: 87  Temp: 98.1 F (36.7 C)  Resp: 18    Intake/Output Summary (Last 24 hours) at 04/26/12 0908 Last data filed at 04/26/12 0603  Gross per 24 hour  Intake 5181.25 ml  Output      0 ml  Net 5181.25 ml   Physical Exam: Gen:  NAD HEENT: Moist mucous membranes CV: Regular rate and rhythm, no murmurs rubs or gallops PULM: Clear to auscultation bilaterally.  ABD: Soft, non tender, non distended, normal bowel sounds. EXT: both feet covered with dressing. Toes exposed and with good coloration. Some desquamation seen. Pt declines more extensive examination.    Neuro: Alert and oriented x3. No focalization   Labs and imaging:  CBC  Lab 04/25/12 0950 04/24/12 1150  WBC 10.1 11.7*  HGB 10.2* 10.8*  HCT 30.6* 32.3*  PLT 315 335   BMET  Lab 04/24/12 1150  NA 134*  K 3.7  CL 97  CO2 26  BUN 5*  CREATININE 0.67  LABGLOM --  GLUCOSE 86  CALCIUM 9.5   Medications: Scheduled Meds:   . heparin  5,000 Units Subcutaneous Q8H  . HYDROmorphone PCA 0.3 mg/mL   Intravenous Q4H  . levofloxacin (LEVAQUIN) IV  750 mg Intravenous Q24H  . lidocaine  5 mL Infiltration Once  . metronidazole  500 mg Intravenous Q8H  . predniSONE  50 mg Oral Q breakfast  . terbinafine  250 mg Oral Daily  . vancomycin  1,500 mg Intravenous Q12H   Continuous Infusions:   . sodium chloride 125 mL/hr (04/25/12 2118)   PRN Meds:.acetaminophen, acetaminophen, diphenhydrAMINE, diphenhydrAMINE, docusate sodium, naloxone, ondansetron (ZOFRAN) IV, sodium chloride  Assessment and  Plan: 41 y/o F admitted for bilateral feet blistering and left foot abscess. 1. Skin lesions: Awaiting pathology results of biopsy.  - Continue with emollients and Xeroform gauze with kerlex daily changes. Dermatology consult as outpatient.   2. Left foot abscess s/p I&D. Pain controlled on reduced dose dilaudid PCA. Results of bacterial and fungal cultures are re incubated for better growth. Normal WBC, afebrile.  - Continue abx and antifungal IV treatment for today may transition to oral abx tomorrow.   3- Elevated systolic BP. No history of HTN. Pain controlled. Pt very tearful and upset wishing to go home. Will continue to monitor.   FEN/GI: regular diet.  Prophylaxis: heparin s/c Disposition: on IV broad spectrum abx. Pending results of cultures.  Pt wanted to leave AMA but after discussion she decided to stay until cultures are back and antibiotic coverage is narrowed and transitioned to orals.   D. Piloto Rolene Arbour, MD PGY2, Family Medicine Teaching Service  04/26/2012

## 2012-04-27 LAB — WOUND CULTURE

## 2012-04-27 LAB — VANCOMYCIN, TROUGH: Vancomycin Tr: 12.6 ug/mL (ref 10.0–20.0)

## 2012-04-27 MED ORDER — LEVOFLOXACIN 750 MG PO TABS
750.0000 mg | ORAL_TABLET | Freq: Every day | ORAL | Status: DC
  Administered 2012-04-27: 750 mg via ORAL
  Filled 2012-04-27 (×2): qty 1

## 2012-04-27 MED ORDER — DEXTROSE-NACL 5-0.45 % IV SOLN
INTRAVENOUS | Status: DC
  Administered 2012-04-27: 1000 mL via INTRAVENOUS

## 2012-04-27 MED ORDER — VANCOMYCIN HCL 10 G IV SOLR
1500.0000 mg | Freq: Two times a day (BID) | INTRAVENOUS | Status: DC
  Administered 2012-04-27 – 2012-04-28 (×2): 1500 mg via INTRAVENOUS
  Filled 2012-04-27 (×3): qty 1500

## 2012-04-27 NOTE — Progress Notes (Signed)
Lab called positive MRSA results up.  Dr. Deirdre Priest aware and has already addressed in his notes and treatment plan.

## 2012-04-27 NOTE — Progress Notes (Signed)
Daily Progress Note Family Medicine Teaching Service PGY-2   Patient name: Susan Sanders  Medical record [id number] Date of birth:1970/05/30 Age: 42 y.o. Gender: female  LOS: 3 days   Subjective: Comfortable sleeping. Afebrile. Pt reported an unwitnessed fall yesterday (Please refer to prior note for more details0  Objective: Vitals: Filed Vitals:   04/26/12 2140  BP: 134/65  Pulse: 86  Temp: 98.6 F (37 C)  Resp: 19    Intake/Output Summary (Last 24 hours) at 04/27/12 0600 Last data filed at 04/27/12 0514  Gross per 24 hour  Intake 8079.16 ml  Output    200 ml  Net 7879.16 ml   Physical Exam: Gen:  NAD HEENT: Moist mucous membranes CV: Regular rate and rhythm, no murmurs rubs or gallops PULM: Clear to auscultation bilaterally. No wheezes/rales/rhonchi ABD: Soft, non tender, non distended, normal bowel sounds EXT: No edema. Both feet covered with dressing. No drainage seen. MSK; tenderness to palpation on left SA area. Normal ROM. Neuro: Alert and oriented x3. No focalization   Labs and imaging:  Dg Hip Complete Left  04/27/2012  *RADIOLOGY REPORT*  Clinical Data: Larey Seat in hospital  LEFT HIP - COMPLETE 2+ VIEW  Comparison: None  Findings: Symmetric hip and SI joints. Osseous mineralization normal. No acute fracture, dislocation or bone destruction.  IMPRESSION: No acute osseous abnormalities.   Original Report Authenticated By: Ulyses Southward, M.D.    Medications: Scheduled Meds:   . heparin  5,000 Units Subcutaneous Q8H  . levofloxacin (LEVAQUIN) IV  750 mg Intravenous Q24H  . lidocaine  5 mL Infiltration Once  . metronidazole  500 mg Intravenous Q8H  . polyethylene glycol  17 g Oral Daily  . predniSONE  20 mg Oral Q breakfast  . terbinafine  250 mg Oral Daily  . vancomycin  1,500 mg Intravenous Q12H   Continuous Infusions:   . sodium chloride 125 mL/hr at 04/26/12 1208   PRN Meds:.acetaminophen, acetaminophen, diphenhydrAMINE, diphenhydrAMINE, docusate  sodium, naloxone, ondansetron (ZOFRAN) IV, oxyCODONE, sodium chloride  Assessment and Plan: 41 y/o F admitted for bilateral feet blistering and left foot abscess.  1. Skin lesions: Awaiting pathology results of biopsy.  - Continue with emollients and Xeroform gauze with kerlex daily changes. Dermatology consult as outpatient.   2. Left foot abscess s/p I&D. Pain controlled on oxycodone PO. Results of bacterial and fungal cultures preliminary has not shown WBC's, and few Gram + in pairs. No yeasts or fungal elements seen ( will be in progress for 4 weeks). Last WBC 2 days ago wnl, afebrile.  - Continue on Lamisil; Levaquin transitioned to oral. D/C Vanc and Metronidazol.   3. Fall: tenderness most likely from soft tissue trauma. Xray negative for fracture.  - Ice, pain control with current regimen and rest. - Fall precautions.   FEN/GI: regular diet.  Prophylaxis: heparin s/c  Disposition: Pending clinical improvement and results of cultures.   D. Piloto Rolene Arbour, MD PGY2, Family Medicine Teaching Service  04/27/2012

## 2012-04-27 NOTE — Progress Notes (Signed)
ANTIBIOTIC CONSULT NOTE - Follow-Up  Pharmacy Consult for Vancomycin, Levaquin Indication: SA left foot cellulitis/abscess  Allergies  Allergen Reactions  . Amoxicillin Itching and Swelling  . Tramadol     Light headed    Patient Measurements: Height: 5\' 5"  (165.1 cm) Weight: 162 lb 11.2 oz (73.8 kg) IBW/kg (Calculated) : 57  Wt Readings from Last 3 Encounters:  04/25/12 162 lb 11.2 oz (73.8 kg)  04/22/12 157 lb (71.215 kg)  03/28/12 153 lb 11.2 oz (69.718 kg)   Vital Signs: Temp: 98.5 F (36.9 C) (12/15 0630) Temp src: Oral (12/15 0630) BP: 138/85 mmHg (12/15 0630) Pulse Rate: 85  (12/15 0630) Intake/Output from previous day: 12/14 0701 - 12/15 0700 In: 2897.9 [I.V.:2897.9] Out: 500 [Urine:500] Intake/Output from this shift:    Labs:  [facility] 04/25/12 0950 04/24/12 1150  WBC 10.1 11.7*  HGB 10.2* 10.8*  PLT 315 335  LABCREA -- --  CREATININE -- 0.67   Estimated Creatinine Clearance: 91.2 ml/min (by C-G formula based on Cr of 0.67). No results found for this basename: VANCOTROUGH:2,VANCOPEAK:2,VANCORANDOM:2,GENTTROUGH:2,GENTPEAK:2,GENTRANDOM:2,TOBRATROUGH:2,TOBRAPEAK:2,TOBRARND:2,AMIKACINPEAK:2,AMIKACINTROU:2,AMIKACIN:2, in the last 72 hours   Microbiology: Recent Results (from the past 720 hour(s))  WOUND CULTURE     Status: Normal (Preliminary result)   Collection Time   04/24/12  5:31 PM      Component Value Range Status Comment   Specimen Description WOUND FOOT LEFT   Final    Special Requests NONE   Final    Gram Stain     Final    Value: RARE WBC PRESENT, PREDOMINANTLY MONONUCLEAR     RARE SQUAMOUS EPITHELIAL CELLS PRESENT     RARE GRAM POSITIVE COCCI IN CHAINS     IN PAIRS   Culture     Final    Value: ABUNDANT STAPHYLOCOCCUS AUREUS     Note: RIFAMPIN AND GENTAMICIN SHOULD NOT BE USED AS SINGLE DRUGS FOR TREATMENT OF STAPH INFECTIONS.   Report Status PENDING   Incomplete   TISSUE CULTURE     Status: Normal (Preliminary result)   Collection Time    04/25/12  1:38 PM      Component Value Range Status Comment   Specimen Description TISSUE LEFT FOOT   Final    Special Requests NONE   Final    Gram Stain     Final    Value: NO WBC SEEN     NO ORGANISMS SEEN   Culture Culture reincubated for better growth   Final    Report Status PENDING   Incomplete   CULTURE, ROUTINE-ABSCESS     Status: Normal (Preliminary result)   Collection Time   04/25/12  1:38 PM      Component Value Range Status Comment   Specimen Description ABSCESS LEFT FOOT   Final    Special Requests Normal   Final    Gram Stain     Final    Value: NO WBC SEEN     RARE SQUAMOUS EPITHELIAL CELLS PRESENT     RARE GRAM POSITIVE COCCI IN PAIRS   Culture     Final    Value: ABUNDANT STAPHYLOCOCCUS AUREUS     Note: RIFAMPIN AND GENTAMICIN SHOULD NOT BE USED AS SINGLE DRUGS FOR TREATMENT OF STAPH INFECTIONS.   Report Status PENDING   Incomplete   FUNGUS CULTURE W SMEAR     Status: Normal (Preliminary result)   Collection Time   04/25/12  1:38 PM      Component Value Range Status Comment   Specimen Description  TISSUE LEFT FOOT   Final    Special Requests Normal   Final    Fungal Smear NO YEAST OR FUNGAL ELEMENTS SEEN   Final    Culture CULTURE IN PROGRESS FOR FOUR WEEKS   Final    Report Status PENDING   Incomplete   FUNGUS CULTURE W SMEAR     Status: Normal (Preliminary result)   Collection Time   04/25/12  1:38 PM      Component Value Range Status Comment   Specimen Description ABSCESS LEFT FOOT   Final    Special Requests Normal   Final    Fungal Smear NO YEAST OR FUNGAL ELEMENTS SEEN   Final    Culture CULTURE IN PROGRESS FOR FOUR WEEKS   Final    Report Status PENDING   Incomplete    Assessment: 41 year old female admitted with bilateral lower extremity cellulitis. Day #4 Vanc/Levaquin (has pcn allergy) for abundant SA in L foot wound/abscess cultures. Tm 99.7. Wbc wnl. Noted MD changed levaquin to po.  Vanc 12/12>> Levaquin 12/12>> Flagyl  12/12>>12/15  12/12 L foot wound - abundant SA 12/13 L foot tissue - ngtd 12/13 L foot abscess - abundant SA  Goal of Therapy:  Vanc trough ~15 with abscess  Plan:  1) Continue Vancomycin 1500mg  IV q12h 2) Levaquin 750mg  IV q24h 3) Follow up sensitivities - if Vanc continues, will check trough tomorrow.  Christoper Fabian, PharmD, BCPS Clinical pharmacist, pager [phone] 04/27/2012, 8:30 AM

## 2012-04-27 NOTE — Progress Notes (Signed)
Family Medicine Teaching Service Attending Note  I interviewed and examined patient Susan Sanders and reviewed their tests and x-rays.  I discussed with Dr. Aviva Signs and reviewed their note for today.  I agree with their assessment and plan.     Additionally  Feet appearing somewhat improved.  Significant edema on left foot diffusely MRSA noted on culture  Would continue IV Vanc through tomorrow then change to oral MRSA coverage Would also continue prednisone and terbenafine give likely multiple etiologies Her anxiety will likely complicate outpatient follow up and cooperation so will need to see frequently Monitor edema of left foot and previously opened abscess to ensure does not reaccumulate Elevate fob

## 2012-04-28 ENCOUNTER — Telehealth: Payer: Self-pay | Admitting: Family Medicine

## 2012-04-28 DIAGNOSIS — L0390 Cellulitis, unspecified: Secondary | ICD-10-CM | POA: Diagnosis present

## 2012-04-28 MED ORDER — TERBINAFINE HCL 250 MG PO TABS: 250.0000 mg | ORAL_TABLET | Freq: Every day | ORAL | Status: DC

## 2012-04-28 MED ORDER — SULFAMETHOXAZOLE-TMP DS 800-160 MG PO TABS
1.0000 | ORAL_TABLET | Freq: Two times a day (BID) | ORAL | Status: DC
  Administered 2012-04-28: 1 via ORAL
  Filled 2012-04-28 (×3): qty 1

## 2012-04-28 MED ORDER — SULFAMETHOXAZOLE-TMP DS 800-160 MG PO TABS: 1.0000 | ORAL_TABLET | Freq: Two times a day (BID) | ORAL | Status: DC

## 2012-04-28 MED ORDER — PREDNISONE 20 MG PO TABS: 20.0000 mg | ORAL_TABLET | Freq: Every day | ORAL | Status: DC

## 2012-04-28 NOTE — Telephone Encounter (Signed)
Patient is calling because when she was DC'd from the hospital, she was not given anything for pain.  At the time of DC, she didn't know that an RX for pain medication wasn't being given.  She uses Statistic[facility] at [facility].

## 2012-04-28 NOTE — Progress Notes (Signed)
Pt discharged to home discharge instruction explained pt verbalized understanding. 

## 2012-04-28 NOTE — Progress Notes (Signed)
PGY-1 Daily Progress Note Family Medicine Teaching Service [place] R. Janayla Marik, DO Service Pager: [phone]   Subjective: Pt upset this morning about the way the nurses have been treating her.  She states her feet are better and wants to go home.   Objective:  VITALS Temp:  [98.2 F (36.8 C)-98.6 F (37 C)] 98.2 F (36.8 C) (12/16 0600) Pulse Rate:  [71-76] 75  (12/16 0600) Resp:  [18-20] 19  (12/16 0600) BP: (138-148)/(69-81) 138/81 mmHg (12/16 0600) SpO2:  [98 %-100 %] 100 % (12/16 0600)  In/Out  Intake/Output Summary (Last 24 hours) at 04/28/12 0851 Last data filed at 04/28/12 0600  Gross per 24 hour  Intake 2704.17 ml  Output      0 ml  Net 2704.17 ml    Physical Exam: General: in mild distress HEENT: no oral lesions seen. Normocephalic/atraumatic.  Cardiovascular: RRR, no murmurs/rubs/gallops  Respiratory: normal respiratory effort  Abdomen: soft, nontender, nondistended, no masses palpable. normal bowel sounds  Extremities: left shin has a ~1.5 cm draining abscess (bandaged). Feet are initially each wrapped. Nikolsky's sign is negative.  + for multiple non draining vesicles on both dorsal and palmar aspects of B/L hands.  Neuro: nonfocal, speech intact, oriented to person, place, and time.  Normal gait    MEDS Scheduled Meds:    . heparin  5,000 Units Subcutaneous Q8H  . lidocaine  5 mL Infiltration Once  . polyethylene glycol  17 g Oral Daily  . predniSONE  20 mg Oral Q breakfast  . sulfamethoxazole-trimethoprim  1 tablet Oral Q12H  . terbinafine  250 mg Oral Daily   Continuous Infusions:    . dextrose 5 % and 0.45% NaCl 1,000 mL (04/27/12 1210)   PRN Meds:.acetaminophen, acetaminophen, diphenhydrAMINE, diphenhydrAMINE, docusate sodium, naloxone, ondansetron (ZOFRAN) IV, oxyCODONE, sodium chloride  Labs and imaging:   CBC  Lab 04/25/12 0950 04/24/12 1150  WBC 10.1 11.7*  HGB 10.2* 10.8*  HCT 30.6* 32.3*  PLT 315 335   BMET/CMET  Lab 04/24/12 1150   NA 134*  K 3.7  CL 97  CO2 26  BUN 5*  CREATININE 0.67  CALCIUM 9.5  PROT --  BILITOT --  ALKPHOS --  ALT --  AST --  GLUCOSE 86   Results for orders placed during the hospital encounter of 04/24/12 (from the past 24 hour(s))  VANCOMYCIN, TROUGH     Status: Normal   Collection Time   04/27/12 11:09 PM      Component Value Range   Vancomycin Tr 12.6  10.0 - 20.0 ug/mL   Dg Hip Complete Left  04/27/2012  *RADIOLOGY REPORT*  Clinical Data: Larey Seat in hospital  LEFT HIP - COMPLETE 2+ VIEW  Comparison: None  Findings: Symmetric hip and SI joints. Osseous mineralization normal. No acute fracture, dislocation or bone destruction.  IMPRESSION: No acute osseous abnormalities.   Original Report Authenticated By: Ulyses Southward, M.D.         Assessment  Pt is a 41 y.o. 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   1) Papular Skin lesions on B/L hands and Feet with Left foot abscess    1)Emolionts placed to the area qd along with Xeroform gauze qd.  2) Labs negative for HIV/RPR and ANA.  ESR at 24  3) Punch Bx performed and sent to histology for examination  4) Continue Prednisone 20 mg qd.  Will continue up  until 14 days before considering taper  5) Derm Consult as outpatient.   2) Left Foot Abscess s/p I&D   1) NGTD fungal Cx. Will be in Progress for 4 weeks  2) Tissue Cx shows few Gram + in pairs sensitive to multiple ABx.   3) Three days of IV ABx.  Will switch to oral Bactrim BID for 7 more days for total of 10.    4) Continue Lamisil upon d/c for possible fungal infxn.  Will put in D/C summary to continue at own discretion.    3) Fall - Fell yesterday.  X-ray negative. Conservative treatment.  Able to ambulate today without problems   FEN/GI - SLIV Dispo Pending clinical improvement    Judie Grieve R. Paulina Fusi, DO of [facility] 04/28/2012,  8:37 AM

## 2012-04-28 NOTE — Progress Notes (Signed)
FMTS Attending Daily Note:  Susan Don MD  [phone] pager  Family Practice pager:  [phone] I have seen and examined this patient and have reviewed their chart. I have discussed this patient with the resident. I agree with the resident's findings, assessment and care plan.  Patient has demonstrated much improvement since I saw her in outpatient clinic.  Plan is to discharge home today with oral antibiotics, oral antifungals, close outpatient followup in our clinic as well as Wound Care center.

## 2012-04-28 NOTE — Progress Notes (Signed)
ANTIBIOTIC CONSULT NOTE - Follow-Up  Pharmacy Consult for Vancomycin Indication: SA left foot cellulitis/abscess  Allergies  Allergen Reactions  . Amoxicillin Itching and Swelling  . Tramadol     Light headed    Patient Measurements: Height: 5\' 5"  (165.1 cm) Weight: 162 lb 11.2 oz (73.8 kg) IBW/kg (Calculated) : 57  Wt Readings from Last 3 Encounters:  04/25/12 162 lb 11.2 oz (73.8 kg)  04/22/12 157 lb (71.215 kg)  03/28/12 153 lb 11.2 oz (69.718 kg)   Labs:  Basename 04/25/12 0950  WBC 10.1  HGB 10.2*  PLT 315  LABCREA --  CREATININE --   Estimated Creatinine Clearance: 91.2 ml/min (by C-G formula based on Cr of 0.67).  Basename 04/27/12 2309  VANCOTROUGH 12.6  VANCOPEAK --  VANCORANDOM --  GENTTROUGH --  GENTPEAK --  GENTRANDOM --  TOBRATROUGH --  TOBRAPEAK --  TOBRARND --  AMIKACINPEAK --  AMIKACINTROU --  AMIKACIN --     Microbiology: Recent Results (from the past 720 hour(s))  WOUND CULTURE     Status: Normal   Collection Time   04/24/12  5:31 PM      Component Value Range Status Comment   Specimen Description WOUND FOOT LEFT   Final    Special Requests NONE   Final    Gram Stain     Final    Value: RARE WBC PRESENT, PREDOMINANTLY MONONUCLEAR     RARE SQUAMOUS EPITHELIAL CELLS PRESENT     RARE GRAM POSITIVE COCCI IN CHAINS     IN PAIRS   Culture     Final    Value: ABUNDANT METHICILLIN RESISTANT STAPHYLOCOCCUS AUREUS     Note: RIFAMPIN AND GENTAMICIN SHOULD NOT BE USED AS SINGLE DRUGS FOR TREATMENT OF STAPH INFECTIONS. This organism DOES NOT demonstrate inducible Clindamycin resistance in vitro.     Note: CRITICAL RESULT CALLED TO, READ BACK BY AND VERIFIED WITH: MELANIE W @9 :10AM 04/27/12 BY DWEEKS   Report Status 04/27/2012 FINAL   Final    Organism ID, Bacteria METHICILLIN RESISTANT STAPHYLOCOCCUS AUREUS   Final   TISSUE CULTURE     Status: Normal (Preliminary result)   Collection Time   04/25/12  1:38 PM      Component Value Range Status  Comment   Specimen Description TISSUE LEFT FOOT   Final    Special Requests NONE   Final    Gram Stain     Final    Value: NO WBC SEEN     NO ORGANISMS SEEN   Culture Culture reincubated for better growth   Final    Report Status PENDING   Incomplete   CULTURE, ROUTINE-ABSCESS     Status: Normal (Preliminary result)   Collection Time   04/25/12  1:38 PM      Component Value Range Status Comment   Specimen Description ABSCESS LEFT FOOT   Final    Special Requests Normal   Final    Gram Stain     Final    Value: NO WBC SEEN     RARE SQUAMOUS EPITHELIAL CELLS PRESENT     RARE GRAM POSITIVE COCCI IN PAIRS   Culture     Final    Value: ABUNDANT STAPHYLOCOCCUS AUREUS     Note: RIFAMPIN AND GENTAMICIN SHOULD NOT BE USED AS SINGLE DRUGS FOR TREATMENT OF STAPH INFECTIONS.   Report Status PENDING   Incomplete   FUNGUS CULTURE W SMEAR     Status: Normal (Preliminary result)   Collection Time  04/25/12  1:38 PM      Component Value Range Status Comment   Specimen Description TISSUE LEFT FOOT   Final    Special Requests Normal   Final    Fungal Smear NO YEAST OR FUNGAL ELEMENTS SEEN   Final    Culture CULTURE IN PROGRESS FOR FOUR WEEKS   Final    Report Status PENDING   Incomplete   FUNGUS CULTURE W SMEAR     Status: Normal (Preliminary result)   Collection Time   04/25/12  1:38 PM      Component Value Range Status Comment   Specimen Description ABSCESS LEFT FOOT   Final    Special Requests Normal   Final    Fungal Smear NO YEAST OR FUNGAL ELEMENTS SEEN   Final    Culture CULTURE IN PROGRESS FOR FOUR WEEKS   Final    Report Status PENDING   Incomplete    Assessment: 41 year old female MRSA cellulitis/abcess for vancomycin.  Goal of Therapy:  Vanc trough ~15 with abscess  Plan:  Continue Vancomycin 1500mg  IV q12h  Geannie Risen, PharmD, BCPS  04/28/2012, 12:01 AM

## 2012-04-28 NOTE — Discharge Summary (Signed)
Physician Discharge Summary  Patient ID: CING Marlette MRN: [id number] DOB: 1970/12/04 Age: 41 y.o.  Admit date: 04/24/2012 Discharge date: 04/28/2012 Admitting Physician: Carney Living, MD  PCP: Majel Homer, MD  Consultants:None     Discharge Diagnosis:  Principal Problem:  *Cellulitis and abscess Active Problems:  Anxiety and depression  Dyshidrotic eczema  Foot pain    Hospital Course Ms. Deleon is a 41 y/o female who presented to the hospital with B/L foot and hand pustular rash along with large abscess on the left plantar aspect of her foot.   1) B/L Pustular Rashes Foot and Hands (Dyshydrotic Eczema vs Pemphigoid vs Fungal Infxn) - Pt presented to the ED with several day history of recurrent wounds on her feet.  She received one dose of vancomycin in the ED for possible MRSA and after admission was started on prednisone 50 mg qd for autoimmune vs dyshidrotic eczema.  Labs were ordered including an HIV, RPR, ESR (24), and ANA which were all negative and a culture was taken of the site.  Also, pt was started on Levaquin and Flagyl along with the Vancomycin to cover MRSA and anaerobes for possible infectious causes.  For her pain, she was started on a low dose of a Dilaudid PCA which helped control her pain.  Emollients were placed to the area along with sterile dressings.  Further, a punch biopsy was explained to the pt and agreed upon, eventually being performed and sent for evaluation by histology.  The procedure was tolerated well and her pain was well controlled with the dilaudid PCA.  She was started on Lamisil 250 mg qd for possible tinea pedis and a CMET was ordered for her to have performed after d/c.  This treatment will have to be continued for 4-6 weeks and was started on 04/25/12.  She was switched from dilaudid PCA eventually to oxycodone and this helped control her pain.  Pt did improve to the point she could ambulate without assistance, her pain was controlled on her  home dose of pain medications, she remained afebrile during her hospital stay and also did not develop a leukocytosis.    2) Left foot plantar abscess - Pt presented to the ED with a 2 x 2 cm non draining abscess.  She was started on multiple ABx for extended coverage and a culture was taken on admission.  Pt remained afebrile and did not have a white count.  Pt consented to and had an I & D of the abscess in which she had increased pain that a one time dose of dilaudid in addition to her PCA controlled.  Wound Cx for bacteria and fungus were went to the lab, both of the pus and of the actual tissue.  The wound Cx came back positive for MRSA but the fungal Cx was NGTD (will be incubated for another 4 weeks).  She was switched from IV ABx to Bactrim.  She was treated for a total of 3 days of IV ABx and will complete another 7 days of Bactrim in the outpatient settings.  Sensitivities from the culture showed the bacteria was sensitive to all ABx except PCN and Oxicallin.   3) Fall - Pt had one episode of fall when tripping getting out of bed.  An X-ray of the Left hip was performed and was negative for Fx.  She was managed conservatively.  Stable at D/C.         Discharge PE   Filed Vitals:   04/28/12  0600  BP: 138/81  Pulse: 75  Temp: 98.2 F (36.8 C)  Resp: 19   General: in mild distress  HEENT: no oral lesions seen. Normocephalic/atraumatic.  Cardiovascular: RRR, no murmurs/rubs/gallops  Respiratory: normal respiratory effort  Abdomen: soft, nontender, nondistended, no masses palpable. normal bowel sounds  Extremities: Wrapped With Kerlix.  No serosanguinous d/c noted.  Neuro: nonfocal, speech intact, oriented to person, place, and time. Normal gait       Procedures/Imaging:  Dg Hip Complete Left  04/27/2012    IMPRESSION: No acute osseous abnormalities.   Original Report Authenticated By: Ulyses Southward, M.D.    Dg Foot Complete Left  04/24/2012    IMPRESSION: Negative.   Original  Report Authenticated By: Dwyane Dee, M.D.    Dg Foot Complete Right  04/24/2012    IMPRESSION: Negative.   Original Report Authenticated By: Dwyane Dee, M.D.     Labs  CBC  Lab 04/25/12 0950 04/24/12 1150  WBC 10.1 11.7*  HGB 10.2* 10.8*  HCT 30.6* 32.3*  PLT 315 335   BMET  Lab 04/24/12 1150  NA 134*  K 3.7  CL 97  CO2 26  BUN 5*  CREATININE 0.67  CALCIUM 9.5  PROT --  BILITOT --  ALKPHOS --  ALT --  AST --  GLUCOSE 86   FUNGUS CULTURE W SMEAR     Status: Normal (Preliminary result)   Collection Time   04/25/12  1:38 PM      Component Value Range Comment   Specimen Description ABSCESS LEFT FOOT      Special Requests Normal      Fungal Smear NO YEAST OR FUNGAL ELEMENTS SEEN      Culture CULTURE IN PROGRESS FOR FOUR WEEKS      Report Status PENDING          Patient condition at time of discharge/disposition: stable  Disposition-home   Follow up issues: 1. F/U Punch Biopsy findings for possible autoimmune skin disorder.  Pt has referral to Dermatology in outpatient setting as well. 2. Continued on Prednisone and Lamisil for possible fungal vs autoimmune.  Consider continuing Prednisone if still having inflammation/pustules.  Also please recheck CMET for LFT's from Lamisil around the middle of January 2014.  3. Pt should have completed a 7 day course (Total of 10 days of ABx) of Bactrim in the outpatient setting for her abscess.   Discharge follow up:  Follow-up Information    Follow up with [facility], MD. (Monday 12/30 @ 8:30 AM )    Contact information:   [address] [place] [address] [phone]         Discharge Orders    Future Appointments: Provider: Department: Dept Phone: Center:   05/12/2012 8:30 AM Tobey Grim, MD [facility] [phone] [facility]   05/22/2012 8:00 AM [facility] [phone] [facility]     Future Orders Please Complete By Expires   Comp  Met (CMET)   04/28/13   Questions: Responses:   Has the patient fasted?    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  Punch Bx pending.   Diet - low sodium heart healthy      Increase activity slowly      Change dressing (specify)      Comments:   Dressing change: 1 times per day using Xeroform and kerflex.   Call MD for:  redness,  tenderness, or signs of infection (pain, swelling, redness, odor or green/yellow discharge around incision site)      Call MD for:  persistant dizziness or light-headedness          Discharge Instructions: Please refer to Patient Instructions section of EMR for full details.  Patient was counseled important signs and symptoms that should prompt return to medical care, changes in medications, dietary instructions, activity restrictions, and follow up appointments.  Significant instructions noted below:    Discharge Medications   Medication List     As of 04/28/2012  1:11 PM    START taking these medications         predniSONE 20 MG tablet   Commonly known as: DELTASONE   Take 1 tablet (20 mg total) by mouth daily with breakfast.      sulfamethoxazole-trimethoprim 800-160 MG per tablet   Commonly known as: BACTRIM DS   Take 1 tablet by mouth every 12 (twelve) hours.      CHANGE how you take these medications         terbinafine 250 MG tablet   Commonly known as: LAMISIL   Take 1 tablet (250 mg total) by mouth daily.   What changed: how often to take the med      CONTINUE taking these medications         acetaminophen 500 MG tablet   Commonly known as: TYLENOL      clobetasol cream 0.05 %   Commonly known as: TEMOVATE      multivitamin with minerals Tabs      oxyCODONE-acetaminophen 5-325 MG per tablet   Commonly known as: PERCOCET/ROXICET   Take 2 tablets by mouth every 6 (six) hours as needed for pain.          Where to get your medications    These are the prescriptions that you need to  pick up. We sent them to a specific pharmacy, so you will need to go there to get them.   [facility] [address] [place], [place] - [address]    [address] [place] [address]    Phone: [phone]        predniSONE 20 MG tablet   sulfamethoxazole-trimethoprim 800-160 MG per tablet   terbinafine 250 MG tablet                Gildardo Cranker, DO of [facility] 04/28/2012 12:16 PM

## 2012-04-29 ENCOUNTER — Telehealth: Payer: Self-pay | Admitting: *Deleted

## 2012-04-29 ENCOUNTER — Other Ambulatory Visit: Payer: Self-pay | Admitting: Family Medicine

## 2012-04-29 LAB — CULTURE, ROUTINE-ABSCESS
Gram Stain: NONE SEEN
Special Requests: NORMAL

## 2012-04-29 LAB — TISSUE CULTURE

## 2012-04-29 MED ORDER — OXYCODONE-ACETAMINOPHEN 5-325 MG PO TABS: 2.0000 | ORAL_TABLET | Freq: Four times a day (QID) | ORAL | Status: DC | PRN

## 2012-04-29 NOTE — Telephone Encounter (Signed)
Informed pt that her rx is up front ready for p/u.[name] [place]

## 2012-04-29 NOTE — Discharge Summary (Signed)
Family Medicine Teaching Service  Discharge Note : Attending Jeff Keldon Lassen MD Pager [phone] Inpatient Team Pager:  [phone]  I have seen and examined this patient, reviewed their chart and discussed discharge planning with the resident at the time of discharge. I agree with the discharge plan as above.  

## 2012-04-29 NOTE — Telephone Encounter (Signed)
Will forward to Dr Ritch 

## 2012-04-29 NOTE — Telephone Encounter (Signed)
Can you let her know that a prescription is available for pickup.

## 2012-04-30 ENCOUNTER — Encounter: Payer: Self-pay | Admitting: Family Medicine

## 2012-04-30 ENCOUNTER — Ambulatory Visit (INDEPENDENT_AMBULATORY_CARE_PROVIDER_SITE_OTHER): Payer: BC Managed Care – PPO | Admitting: Family Medicine

## 2012-04-30 VITALS — BP 142/82 | HR 90 | Temp 97.8°F | Wt 157.2 lb

## 2012-04-30 DIAGNOSIS — L301 Dyshidrosis [pompholyx]: Secondary | ICD-10-CM

## 2012-04-30 NOTE — Patient Instructions (Signed)
Continue taking your medications as prescribed. We will refer you to dermatology for their help in managing your feet. Please keep your appointment on the 30th.

## 2012-04-30 NOTE — Progress Notes (Signed)
Patient ID: Susan Sanders, female   DOB: 01-17-1971, 41 y.o.   MRN: [id number] Subjective: The patient is a 41 y.o. year old female who presents today for hospital followup.  Patient was hospitalized for a bacterial superinfection her feet. During her hospital stay biopsy was performed of her feet that was consistent with dyshidrotic eczema. Today the patient reports that her pain is better her controlled her feet are generally feeling better. She's taking good care of them as far as cleaning is concerned. She reports that they are swelling less. She is losing some skin off of them. She continues with her medications as prescribed at hospital discharge. Patient denies any fevers, chills, nausea, vomiting, diarrhea.  Patient's past medical, social, and family history were reviewed and updated as appropriate. History  Substance Use Topics  . Smoking status: Former Smoker -- 14 years    Quit date: 09/20/2010  . Smokeless tobacco: Never Used  . Alcohol Use: No   Objective:  Filed Vitals:   04/30/12 1642  BP: 142/82  Pulse: 90  Temp: 97.8 F (36.6 C)   Gen: No acute distress Extremities: Both feet has some desquamation. Right foot does not have any evidence of infection and is non-tender and non-swollen. Left foot has slight swelling and an area of granulation tissue with some exudate on the bottom. This area is approximately 1-1/2 cm in diameter. There is no expressible drainage.  Assessment/Plan:  Please also see individual problems in problem list for problem-specific plans.

## 2012-04-30 NOTE — Assessment & Plan Note (Signed)
Continue current treatment plan. I will refer to dermatology. Close follow up will be needed to make certain that infection does not return. Her feet are currently recovering somewhat.

## 2012-05-01 ENCOUNTER — Telehealth: Payer: Self-pay | Admitting: *Deleted

## 2012-05-01 NOTE — Telephone Encounter (Signed)
Call patient to inform her of her Dermatology appointment.  She ask that I send Dr. Louanne Belton a note and let him know [name]

## 2012-05-02 NOTE — Telephone Encounter (Signed)
The note I gave her should be good until when she sees Dr. Gwendolyn Grant on the 30th.  At that time if she needs an additional note it can be provided.

## 2012-05-05 ENCOUNTER — Telehealth: Payer: Self-pay | Admitting: Family Medicine

## 2012-05-05 ENCOUNTER — Encounter: Payer: Self-pay | Admitting: Family Medicine

## 2012-05-05 NOTE — Telephone Encounter (Signed)
I just put a work release letter in patient's chart.  Could you please print it and let the patient know she can pick it up?  Thanks!

## 2012-05-05 NOTE — Telephone Encounter (Signed)
Pt called this am to ask Dr Louanne Belton for a note to go back to work.  She is asking to speak with Dr Louanne Belton - She cannot get FMLA and is about to lose her job.  Needs this note today.  ASAP

## 2012-05-05 NOTE — Telephone Encounter (Signed)
Pt called and informed of letter up front for p/u.Susan Sanders

## 2012-05-12 ENCOUNTER — Ambulatory Visit (INDEPENDENT_AMBULATORY_CARE_PROVIDER_SITE_OTHER): Payer: BC Managed Care – PPO | Admitting: Family Medicine

## 2012-05-12 ENCOUNTER — Encounter: Payer: Self-pay | Admitting: Family Medicine

## 2012-05-12 VITALS — BP 128/82 | HR 76 | Temp 97.6°F | Wt 154.7 lb

## 2012-05-12 DIAGNOSIS — T3695XA Adverse effect of unspecified systemic antibiotic, initial encounter: Secondary | ICD-10-CM

## 2012-05-12 DIAGNOSIS — T367X5A Adverse effect of antifungal antibiotics, systemically used, initial encounter: Secondary | ICD-10-CM

## 2012-05-12 DIAGNOSIS — L301 Dyshidrosis [pompholyx]: Secondary | ICD-10-CM

## 2012-05-12 LAB — COMPREHENSIVE METABOLIC PANEL
ALT: 29 U/L (ref 0–35)
AST: 19 U/L (ref 0–37)
CO2: 26 mEq/L (ref 19–32)
Calcium: 9.6 mg/dL (ref 8.4–10.5)
Chloride: 106 mEq/L (ref 96–112)
Creat: 0.76 mg/dL (ref 0.50–1.10)
Sodium: 140 mEq/L (ref 135–145)
Total Protein: 6.5 g/dL (ref 6.0–8.3)

## 2012-05-12 MED ORDER — TRIAMCINOLONE ACETONIDE 0.5 % EX OINT: TOPICAL_OINTMENT | Freq: Two times a day (BID) | CUTANEOUS | Status: DC

## 2012-05-12 NOTE — Progress Notes (Signed)
  Subjective:    Patient ID: Susan Sanders, female    DOB: 11-28-1970, 41 y.o.   MRN: [id number]  HPI  1.  FU for dyshidrotic eczema:  Patient MUCH improved.  Has finished Clobetasol, but didn't feel this offered much relief.  Felt more relief with Triamcinolone oitment and is requesting this.  Has first Derm appt on Friday of this week, FU Wound Care appt on 1/9.  Pain also much improved, taking Oxycodone as needed, still has about 10 - 12 pills left at home she estimates.   Only other oral medication she is taking is Terbinafine, which she was instructed to start and take whole bottle while in house.  Otherwise no fevers, chills, weight loss.  Does complain of some itching and weeping of one area on Right foot.  Also with 1 patch of scaling Right palm.  Eating and drinking well, no abdominal pain.   Review of Systems See HPI above for review of systems.       Objective:   Physical Exam See HPI above for review of systems.   Skin:   - Feet:  BL scaly patches scattered across plantar surface of feet as well as dorsal aspects.  One 2 cm patch of weeping skin noted lateral aspect ball of Right foot.  Previously drained abscess is well healing ball of Right foot.  No macerated skin.  No sloughing of skin noted.  - Hands:  Right hand with scaly patch noted palm about 1 cm in diameter.  Left hand WNL.       Assessment & Plan:

## 2012-05-12 NOTE — Assessment & Plan Note (Signed)
Patient is greatly improved. Switching the triamcinolone ointment as she had more relief with this than with clobetasol. Recommended to keep followup appointment with her dermatologist this coming Friday. Also recommended to keep wound care appointment on January 9. Since she is still taking that terbinafine we will check or function testing today. Pain under control and patient did not need any refills of her medication today. FU with [place] in 1 month to ensure she is still improving, after she has seen dermatology.   FU sooner if worsening or no improvement.

## 2012-05-12 NOTE — Patient Instructions (Addendum)
Use the Triamcinolone ointment twice a day.  Keep using the powder between your toes.  Keep wrapping the area that is weeping with gauze.    We are going to test your blood today for your liver and let you know the results on Thursday.

## 2012-05-19 ENCOUNTER — Telehealth: Payer: Self-pay | Admitting: Family Medicine

## 2012-05-19 NOTE — Telephone Encounter (Signed)
Patient is calling requesting a refill on her Percocet.  Her feet are causing her a lot of pain.  She would like a call when it is ready to be picked up.

## 2012-05-20 NOTE — Telephone Encounter (Signed)
Appointment scheduled for 1/8 @ 8:30am on cross cover schedule

## 2012-05-20 NOTE — Telephone Encounter (Signed)
I'm afraid she will need to come in for an appointment to obtain a refill on that medication.

## 2012-05-21 ENCOUNTER — Encounter: Payer: Self-pay | Admitting: Family Medicine

## 2012-05-21 ENCOUNTER — Ambulatory Visit (INDEPENDENT_AMBULATORY_CARE_PROVIDER_SITE_OTHER): Payer: Medicaid Other | Admitting: Family Medicine

## 2012-05-21 VITALS — BP 147/96 | HR 95 | Temp 98.8°F | Ht 65.0 in | Wt 150.5 lb

## 2012-05-21 DIAGNOSIS — L301 Dyshidrosis [pompholyx]: Secondary | ICD-10-CM

## 2012-05-21 MED ORDER — TRIAMCINOLONE ACETONIDE 0.025 % EX OINT: TOPICAL_OINTMENT | Freq: Two times a day (BID) | CUTANEOUS | Status: DC

## 2012-05-21 MED ORDER — OXYCODONE-ACETAMINOPHEN 5-325 MG PO TABS: 2.0000 | ORAL_TABLET | Freq: Four times a day (QID) | ORAL | Status: DC | PRN

## 2012-05-21 NOTE — Patient Instructions (Addendum)
Nice to meet you today, Susan Sanders. I sent a jar of steroid cream to your pharmacy.  Apply generously to both feet. Take Percocet as directed. Keep your appointment with Wound Care appointment tomorrow. Call your dermatologist to confirm your appointment today. IF you develop worsening pain not relieved by medication, nausea/vomiting, or unable to bear weight on feet, please return to clinic or go to ER.

## 2012-05-21 NOTE — Assessment & Plan Note (Signed)
Skin of both feet are dry, weeping but unchanged from previous office visit.  Per PCP who examined patient, condition appears improved.  Patient has appointment with Wound care tomorrow to address non-healing ulcers. - Will refill Percocet today - Sent a jar of Triamcinolone ointment to pharm - Follow up wound care and Derm recommendations - No signs of systemic infection today and afebrile - will hold off on antibiotics until seen by wound care - Red flags reviewed, follow up with PCP as needed

## 2012-05-21 NOTE — Progress Notes (Signed)
  Subjective:    Patient ID: Susan Sanders, female    DOB: 1970/05/24, 42 y.o.   MRN: [id number]  HPI  Patient here for same day appointment for medication refill and bilateral foot pain.  Has been diagnosed with dyshidrotic eczema.  Symptoms started in June.  Has first Wound Care appointment 1/9 and patient thinks she has a Derm appt on Friday.  Pain is worse - 10/10 today.  Located "all over feet" but worse on plantar surface of RT foot.  Pain is worse when feet rub against shoes and with walking.  She was able to ambulate to clinic today without difficulty.  She also complains of itching.  She has run out of Percocet and Triamcinolone and asking for refills.  She takes Percocet 2 tab every 4-5 hours during the day.    She denies any fevers, chills, weight loss.  Eating and drinking well, no abdominal pain.    Review of Systems  Per HPI    Objective:   Physical Exam  Constitutional: She appears well-nourished. No distress.  HENT:  Mouth/Throat: Oropharynx is clear and moist.  Musculoskeletal:       2+ strong and equal dorsal pulses  Skin:       Feet: skin is dry, scaly B/L. Weeping and drainage between toes bilaterally.  2 superficial non-healing patches with granulated tissue on ball of RT foot.  Mild erythema RT foot; warm.     Hands:  Right hand with hyperpigmented, scaly patch on palm about 1 cm in diameter.  Left hand normal.       Assessment & Plan:

## 2012-05-22 ENCOUNTER — Encounter (HOSPITAL_BASED_OUTPATIENT_CLINIC_OR_DEPARTMENT_OTHER): Payer: BC Managed Care – PPO

## 2012-05-25 LAB — FUNGUS CULTURE W SMEAR
Fungal Smear: NONE SEEN
Special Requests: NORMAL
Special Requests: NORMAL

## 2012-06-06 ENCOUNTER — Ambulatory Visit: Payer: BC Managed Care – PPO | Admitting: Family Medicine

## 2012-07-21 ENCOUNTER — Encounter (HOSPITAL_COMMUNITY): Payer: Self-pay

## 2012-07-21 ENCOUNTER — Emergency Department (INDEPENDENT_AMBULATORY_CARE_PROVIDER_SITE_OTHER): Payer: Worker's Compensation

## 2012-07-21 ENCOUNTER — Emergency Department (HOSPITAL_COMMUNITY)
Admission: EM | Admit: 2012-07-21 | Discharge: 2012-07-21 | Disposition: A | Discharge status: Home / Self Care | Payer: Worker's Compensation | Source: Home / Self Care

## 2012-07-21 DIAGNOSIS — S93601A Unspecified sprain of right foot, initial encounter: Secondary | ICD-10-CM

## 2012-07-21 DIAGNOSIS — S93609A Unspecified sprain of unspecified foot, initial encounter: Secondary | ICD-10-CM

## 2012-07-21 MED ORDER — NAPROXEN 375 MG PO TABS: 375.0000 mg | ORAL_TABLET | Freq: Two times a day (BID) | ORAL | Status: DC

## 2012-07-21 MED ORDER — IBUPROFEN 800 MG PO TABS
400.0000 mg | ORAL_TABLET | Freq: Once | ORAL | Status: AC
  Administered 2012-07-21: 400 mg via ORAL

## 2012-07-21 MED ORDER — IBUPROFEN 800 MG PO TABS
ORAL_TABLET | ORAL | Status: AC
  Filled 2012-07-21: qty 1

## 2012-07-21 NOTE — ED Provider Notes (Signed)
History     CSN: [id number]  Arrival date & time 07/21/12  1615   First MD Initiated Contact with Patient 07/21/12 1716      Chief Complaint  Patient presents with  . Fall    (Consider location/radiation/quality/duration/timing/severity/associated sxs/prior treatment) Patient is a 42 y.o. female presenting with fall.  Fall Associated symptoms include headaches.   This is a 42 year old female who slipped on patient's urine awhile working today-patient is a Lawyer at [employer]. She everted her right foot when slipping and is now having pain in the upper outer aspect of the foot. Pain is 5/10 & does not radiate. She is currently not having any pain in the knee or hip joint. She does complain of a headache. Past Medical History  Diagnosis Date  . Scoliosis   . Complication of anesthesia     difficult waking up "  . Chest pain   . Depression   . Chronic back pain greater than 3 months duration   . Eczema   . Cellulitis     Past Surgical History  Procedure Laterality Date  . Tubal ligation      History reviewed. No pertinent family history.  History  Substance Use Topics  . Smoking status: Former Smoker -- 14 years    Quit date: 09/20/2010  . Smokeless tobacco: Never Used  . Alcohol Use: No    OB History   Grav Para Term Preterm Abortions TAB SAB Ect Mult Living                  Review of Systems  Respiratory: Negative.   Cardiovascular: Negative.   Gastrointestinal: Negative.   Genitourinary: Negative.   Musculoskeletal:       Per history of present illness  Skin: Negative.   Neurological: Positive for headaches.  Hematological: Negative.   Psychiatric/Behavioral: Negative.     Allergies  Amoxicillin and Tramadol  Home Medications   Current Outpatient Rx  Name  Route  Sig  Dispense  Refill  . acetaminophen (TYLENOL) 500 MG tablet   Oral   Take 1,000 mg by mouth every 6 (six) hours as needed. For pain         . clobetasol cream (TEMOVATE) 0.05 %   Topical   Apply 1 application topically 2 (two) times daily. Applied to feet.         . Multiple Vitamin (MULITIVITAMIN WITH MINERALS) TABS   Oral   Take 1 tablet by mouth daily.         . naproxen (NAPROSYN) 375 MG tablet   Oral   Take 1 tablet (375 mg total) by mouth 2 (two) times daily with a meal.   30 tablet   0   . oxyCODONE-acetaminophen (PERCOCET/ROXICET) 5-325 MG per tablet   Oral   Take 2 tablets by mouth every 6 (six) hours as needed for pain.   45 tablet   0   . predniSONE (DELTASONE) 20 MG tablet   Oral   Take 1 tablet (20 mg total) by mouth daily with breakfast.   10 tablet   0   . sulfamethoxazole-trimethoprim (BACTRIM DS) 800-160 MG per tablet   Oral   Take 1 tablet by mouth every 12 (twelve) hours.   14 tablet   0   . terbinafine (LAMISIL) 250 MG tablet   Oral   Take 1 tablet (250 mg total) by mouth daily.   30 tablet   1   . triamcinolone (KENALOG) 0.025 % ointment  Topical   Apply topically 2 (two) times daily.   454 g   0   . triamcinolone ointment (KENALOG) 0.5 %   Topical   Apply topically 2 (two) times daily.   30 g   1     BP 144/84  Pulse 83  Temp(Src) 98.8 F (37.1 C) (Oral)  Resp 20  SpO2 100%  Physical Exam  Constitutional: She appears well-developed and well-nourished.  HENT:  Head: Normocephalic and atraumatic.  Eyes: Conjunctivae and EOM are normal. Pupils are equal, round, and reactive to light.  Neck: Normal range of motion. Neck supple.  Cardiovascular: Normal rate and regular rhythm.   Pulmonary/Chest: Effort normal and breath sounds normal.  Abdominal: Soft. Bowel sounds are normal.  Musculoskeletal:       Feet:  Tenderness on dorsum of right foot proxima/ lateral aspect. See diagram. No edema noted. Pain is mildly exacerbated with inversion and mostly exacerbated on eversion of the foot.     ED Course  Procedures (including critical care time)  Labs Reviewed - No data to display Dg Ankle Complete  Right  07/21/2012  *RADIOLOGY REPORT*  Clinical Data: Pain and injury.  Fell on floor  RIGHT ANKLE - COMPLETE 3+ VIEW  Comparison: Right foot radiographs 04/24/2012  Findings: Normal bony mineralization.  The ankle is located.  No acute or healing fracture is identified.  No significant degenerative change.  No soft tissue gas.  IMPRESSION: No acute bony abnormality identified.   Original Report Authenticated By: Britta Mccreedy, M.D.      1. Sprain of foot, right, initial encounter       MDM  Naprosyn, RICE treatment, ASO.         Calvert Cantor, MD 07/21/12 [phone]

## 2012-07-21 NOTE — ED Notes (Signed)
Reportedly slipped in puddle of urine at work just PTA today , and foot/ankle went under room furnishings. States she injured her extremity that hs had just recovered from I&D of abscess of plantar surface of foot . C/o pain on dorsum of her foot

## 2012-08-15 ENCOUNTER — Ambulatory Visit: Payer: Self-pay | Admitting: Family Medicine

## 2012-08-26 ENCOUNTER — Encounter: Payer: Self-pay | Admitting: Family Medicine

## 2012-08-26 ENCOUNTER — Ambulatory Visit (INDEPENDENT_AMBULATORY_CARE_PROVIDER_SITE_OTHER): Payer: Managed Care, Other (non HMO) | Admitting: Family Medicine

## 2012-08-26 VITALS — BP 139/79 | HR 72 | Temp 97.9°F | Ht 65.0 in | Wt 156.6 lb

## 2012-08-26 DIAGNOSIS — M549 Dorsalgia, unspecified: Secondary | ICD-10-CM

## 2012-08-26 DIAGNOSIS — L301 Dyshidrosis [pompholyx]: Secondary | ICD-10-CM

## 2012-08-26 MED ORDER — DICLOFENAC SODIUM 75 MG PO TBEC: 75.0000 mg | DELAYED_RELEASE_TABLET | Freq: Two times a day (BID) | ORAL | Status: DC

## 2012-08-26 MED ORDER — CLOBETASOL PROPIONATE 0.05 % EX CREA: 1.0000 "application " | TOPICAL_CREAM | Freq: Two times a day (BID) | CUTANEOUS | Status: DC

## 2012-08-26 NOTE — Patient Instructions (Signed)
I have sent in a refill on your cream for your feet.  Use it two times per day whenever your feet are acting up. Please take diclofenac two times per day for your back pain.

## 2012-09-01 NOTE — Progress Notes (Signed)
Patient ID: ARIEONA SWAGGERTY, female   DOB: Jul 23, 1970, 42 y.o.   MRN: [id number] Subjective: The patient is a 42 y.o. year old female who presents today for med refills.  1. Dishydrotic Eczema: Patient has recently run out of clobetasol and is noting that her feet are starting to worsen again.  Is having increasing itching of feet.  No drainage, fevers, chills, or swelling.  No symptoms on her hands.  2. Back pain: Patient also having back pain (continued) since fall at work about a month ago.  Pain is in low back, not accompanied by any bowl/bladder symptoms or any extremity weakness.  Constant, every day, made worse by standing or lifting.  Cannot take tramadol and works in [employer] so cannot have sedatives during day.  Patient's past medical, social, and family history were reviewed and updated as appropriate. History  Substance Use Topics  . Smoking status: Former Smoker -- 14 years    Quit date: 09/20/2010  . Smokeless tobacco: Never Used  . Alcohol Use: No   Objective:  Filed Vitals:   08/26/12 1341  BP: 139/79  Pulse: 72  Temp: 97.9 F (36.6 C)   Gen: NAD Back: Minimal paraspinous muscle spasm, no point tenderness, generalized tenderness throughout back Ext: Mild thickening of skin with hypopigmentation on medial aspects of both feet, R>L.  No vessicles, no drainage, no fluctuance, no erythema  Assessment/Plan:  Please also see individual problems in problem list for problem-specific plans.

## 2012-09-01 NOTE — Assessment & Plan Note (Signed)
Refill today.  F/u in 2-3 months.  At that time can consider increasing dosing interval if symptoms have resolved.  Continue for now due to severity of patient's presentation (requiring hospitalization)

## 2012-09-01 NOTE — Assessment & Plan Note (Signed)
No red flags for nerve intrapment/need for imaging.  As this is a somewhat long-standing issue do not want to begin narcotics without acute cause.  Will try different anti-inflammatory.  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

## 2012-11-09 ENCOUNTER — Encounter (HOSPITAL_COMMUNITY): Payer: Self-pay | Admitting: *Deleted

## 2012-11-09 ENCOUNTER — Emergency Department (HOSPITAL_COMMUNITY)
Admission: EM | Admit: 2012-11-09 | Discharge: 2012-11-09 | Disposition: A | Discharge status: Home / Self Care | Payer: Managed Care, Other (non HMO) | Attending: Emergency Medicine | Admitting: Emergency Medicine

## 2012-11-09 DIAGNOSIS — M549 Dorsalgia, unspecified: Secondary | ICD-10-CM

## 2012-11-09 DIAGNOSIS — R52 Pain, unspecified: Secondary | ICD-10-CM | POA: Insufficient documentation

## 2012-11-09 DIAGNOSIS — Z872 Personal history of diseases of the skin and subcutaneous tissue: Secondary | ICD-10-CM | POA: Insufficient documentation

## 2012-11-09 DIAGNOSIS — M5380 Other specified dorsopathies, site unspecified: Secondary | ICD-10-CM | POA: Insufficient documentation

## 2012-11-09 DIAGNOSIS — Z87891 Personal history of nicotine dependence: Secondary | ICD-10-CM | POA: Insufficient documentation

## 2012-11-09 DIAGNOSIS — G8929 Other chronic pain: Secondary | ICD-10-CM | POA: Insufficient documentation

## 2012-11-09 DIAGNOSIS — Z880 Allergy status to penicillin: Secondary | ICD-10-CM | POA: Insufficient documentation

## 2012-11-09 DIAGNOSIS — M545 Low back pain, unspecified: Secondary | ICD-10-CM | POA: Insufficient documentation

## 2012-11-09 DIAGNOSIS — Z8679 Personal history of other diseases of the circulatory system: Secondary | ICD-10-CM | POA: Insufficient documentation

## 2012-11-09 DIAGNOSIS — Z8659 Personal history of other mental and behavioral disorders: Secondary | ICD-10-CM | POA: Insufficient documentation

## 2012-11-09 DIAGNOSIS — Z8739 Personal history of other diseases of the musculoskeletal system and connective tissue: Secondary | ICD-10-CM | POA: Insufficient documentation

## 2012-11-09 MED ORDER — OXYCODONE-ACETAMINOPHEN 5-325 MG PO TABS: 1.0000 | ORAL_TABLET | Freq: Three times a day (TID) | ORAL | Status: DC | PRN

## 2012-11-09 MED ORDER — CYCLOBENZAPRINE HCL 10 MG PO TABS: 10.0000 mg | ORAL_TABLET | Freq: Two times a day (BID) | ORAL | Status: DC | PRN

## 2012-11-09 NOTE — ED Notes (Signed)
Pt reports having a fall 2-3 months ago and still having lower and upper back pain. Ambulatory at triage.

## 2012-11-09 NOTE — ED Provider Notes (Signed)
History    CSN: [id number] Arrival date & time 11/09/12  1112  First MD Initiated Contact with Patient 11/09/12 1150     Chief Complaint  Patient presents with  . Back Pain   (Consider location/radiation/quality/duration/timing/severity/associated sxs/prior Treatment) HPI  Susan Sanders is a 42 y.o.female with a significant PMH of scoliosis, and chronic back pain presents to the ER with complaints of back pain exacerbation after helping her family members move. She was involved in an accident a few months ago and fell and has been having some pain since then. She has been using Biofreeze and heating pad which do give significant relief. However, the Biofreeze is starting to burn her skin. She has been also trying Ibuprofen at home which has not helped. The pain is from her shoulders all the way down to her butt. She is able to walk and carry on her ADLs. No numbness, tingling, weakness, bowel or urine incontinence.   Reviewed  Drug Data Base and pt does not get narcotics often. Last Rx was in January 2014  Past Medical History  Diagnosis Date  . Scoliosis   . Complication of anesthesia     difficult waking up "  . Chest pain   . Depression   . Chronic back pain greater than 3 months duration   . Eczema   . Cellulitis    Past Surgical History  Procedure Laterality Date  . Tubal ligation     History reviewed. No pertinent family history. History  Substance Use Topics  . Smoking status: Former Smoker -- 14 years    Quit date: 09/20/2010  . Smokeless tobacco: Never Used  . Alcohol Use: No   OB History   Grav Para Term Preterm Abortions TAB SAB Ect Mult Living                 Review of Systems  Musculoskeletal: Positive for back pain.  All other systems reviewed and are negative.    Allergies  Amoxicillin and Tramadol  Home Medications   Current Outpatient Rx  Name  Route  Sig  Dispense  Refill  . ibuprofen (ADVIL,MOTRIN) 800 MG tablet   Oral   Take 800 mg  by mouth every 8 (eight) hours as needed for pain.         . Menthol, Topical Analgesic, (BIOFREEZE EX)   Apply externally   Apply 1 application topically 3 (three) times daily as needed (back pain.).         Marland Kitchen cyclobenzaprine (FLEXERIL) 10 MG tablet   Oral   Take 1 tablet (10 mg total) by mouth 2 (two) times daily as needed for muscle spasms.   20 tablet   0   . oxyCODONE-acetaminophen (PERCOCET/ROXICET) 5-325 MG per tablet   Oral   Take 1 tablet by mouth every 8 (eight) hours as needed for pain.   20 tablet   0    BP 141/78  Pulse 74  Temp(Src) 98.2 F (36.8 C) (Oral)  Resp 18  SpO2 100%  LMP 10/08/2012 Physical Exam  Nursing note and vitals reviewed. Constitutional: She appears well-developed and well-nourished. No distress.  HENT:  Head: Normocephalic and atraumatic.  Eyes: Pupils are equal, round, and reactive to light.  Neck: Normal range of motion. Neck supple.  Cardiovascular: Normal rate and regular rhythm.   Pulmonary/Chest: Effort normal.  Abdominal: Soft.  Musculoskeletal:       Lumbar back: She exhibits pain and spasm.  Back:   Equal strength to bilateral lower extremities. Neurosensory function adequate to both legs. Skin color is normal. Skin is warm and moist. I see no step off deformity, no bony tenderness. Pt is able to ambulate without limp. Pain is relieved when sitting in certain positions. ROM is decreased due to pain. No crepitus, laceration, effusion, swelling.  Pulses are normal   Neurological: She is alert.  Skin: Skin is warm and dry.    ED Course  Procedures (including critical care time) Labs Reviewed - No data to display No results found. 1. Back pain     MDM  Patients pain is acute on chronic. Does not have many visits for pain and very few narcotic scripts in the drug data base. Will treat with muscle relaxer and pain medication.   She plans to see her PCP tomorrow for further treatment and evaluation. Ortho referral  given also.  Patient with back pain. No neurological deficits. Patient is ambulatory. No warning symptoms of back pain including: loss of bowel or bladder control, night sweats, waking from sleep with back pain, unexplained fevers or weight loss, h/o cancer, IVDU, recent significant  trauma. No concern for cauda equina, epidural abscess, or other serious cause of back pain. Conservative measures such as rest, ice/heat and pain medicine indicated with PCP follow-up if no improvement with conservative management.     Dorthula Matas, PA-C 11/09/12 1242

## 2012-11-11 NOTE — ED Provider Notes (Signed)
Medical screening examination/treatment/procedure(s) were performed by non-physician practitioner and as supervising physician I was immediately available for consultation/collaboration.   Laray Anger, DO 11/11/12 1323

## 2012-12-09 ENCOUNTER — Telehealth: Payer: Self-pay | Admitting: Family Medicine

## 2012-12-09 ENCOUNTER — Encounter: Payer: Self-pay | Admitting: *Deleted

## 2012-12-09 NOTE — Telephone Encounter (Signed)
Pt is requesting a copy of her PPD test. She will pick it up around 10 am today 7/29. JW

## 2012-12-09 NOTE — Telephone Encounter (Signed)
Results placed up front for pick up. Wyatt Haste, RN-BSN

## 2013-01-08 ENCOUNTER — Ambulatory Visit (INDEPENDENT_AMBULATORY_CARE_PROVIDER_SITE_OTHER): Payer: Medicaid Other | Admitting: Family Medicine

## 2013-01-08 ENCOUNTER — Encounter: Payer: Self-pay | Admitting: Family Medicine

## 2013-01-08 VITALS — BP 154/88 | HR 75 | Temp 99.2°F | Ht 65.0 in | Wt 159.0 lb

## 2013-01-08 DIAGNOSIS — M549 Dorsalgia, unspecified: Secondary | ICD-10-CM

## 2013-01-08 MED ORDER — OXYCODONE-ACETAMINOPHEN 5-325 MG PO TABS: 1.0000 | ORAL_TABLET | Freq: Three times a day (TID) | ORAL | Status: DC | PRN

## 2013-01-08 MED ORDER — CYCLOBENZAPRINE HCL 10 MG PO TABS: 10.0000 mg | ORAL_TABLET | Freq: Two times a day (BID) | ORAL | Status: DC | PRN

## 2013-01-08 NOTE — Patient Instructions (Addendum)
Back Pain, Adult Back pain is very common. The pain often gets better over time. The cause of back pain is usually not dangerous. Most people can learn to manage their back pain on their own.  HOME CARE   Stay active. Start with short walks on flat ground if you can. Try to walk farther each day.  Do not sit, drive, or stand in one place for more than 30 minutes. Do not stay in bed.  Do not avoid exercise or work. Activity can help your back heal faster.  Be careful when you bend or lift an object. Bend at your knees, keep the object close to you, and do not twist.  Sleep on a firm mattress. Lie on your side, and bend your knees. If you lie on your back, put a pillow under your knees.  Only take medicines as told by your doctor.  Put ice on the injured area.  Put ice in a plastic bag.  Place a towel between your skin and the bag.  Leave the ice on for 15-20 minutes, 3-4 times a day for the first 2 to 3 days. After that, you can switch between ice and heat packs.  Ask your doctor about back exercises or massage.  Avoid feeling anxious or stressed. Find good ways to deal with stress, such as exercise. GET HELP RIGHT AWAY IF:   Your pain does not go away with rest or medicine.  Your pain does not go away in 1 week.  You have new problems.  You do not feel well.  The pain spreads into your legs.  You cannot control when you poop (bowel movement) or pee (urinate).  Your arms or legs feel weak or lose feeling (numbness).  You feel sick to your stomach (nauseous) or throw up (vomit).  You have belly (abdominal) pain.  You feel like you may pass out (faint). MAKE SURE YOU:   Understand these instructions.  Will watch your condition.  Will get help right away if you are not doing well or get worse. Document Released: 10/17/2007 Document Revised: 07/23/2011 Document Reviewed: 09/18/2010 [facility] Patient Information 2014 [place], [place].  I have ordered xrays to be  taken of your back.  I have also reordered flexeril and pain medications. As discussed these are addictive medications and should only be used short term.  I will call you with results of your xrays.

## 2013-01-08 NOTE — Assessment & Plan Note (Addendum)
-   Considering length of issue, and positive exam findings today with tissue texture changes and lumbar rotation, will obtain imaging studies of Lumbar, Thoracic and cervical spine.  - Flexeril refilled, along with short term narcotics; will not refill if she does not obtain xrays and proper inspection of pain. - Patient aware this may take some time to correct and depending on xrays may need to start PT.  - F/U: 2-3 weeks

## 2013-01-11 ENCOUNTER — Encounter: Payer: Self-pay | Admitting: Family Medicine

## 2013-01-11 NOTE — Progress Notes (Signed)
Subjective:     Patient ID: Susan Sanders, female   DOB: [birth year], 42 y.o.   MRN: [id number]  HPI Pain management: Patient reports a fall that occurred last year (03/2012) at [facility], where she slipped in urine. She has had chronic back pain issues since. She returned to light duty, but was unable to perform her duties due to pain and she was eventually fired from [employer]. Recently, in June, she fell again during moving. She reports she "fell down a hill." She states she was unable to stop and slid down the entire hill on her back. She is extremely tearful and appears to be angry that no one is taking her seriously, and she has not had any work up for her pain. She is accompanied today by her two small children and female friend. She reports the pain is 10/10 and has been worse the past 3 weeks. She states it feels like "constant pulling pain" across her shoulders/neck and lumbar regions. She denies any radiation down her legs or arms, but endorses some wrap around pain to her ASIS bilateral from her lumbar area.   Patient's past medical, social, and family history were reviewed and updated as appropriate.  Review of Systems Negative, with the exception of above mentioned in HPI      Objective:   Physical Exam BP 154/88  Pulse 75  Temp(Src) 99.2 F (37.3 C) (Oral)  Ht 5\' 5"  (1.651 m)  Wt 159 lb (72.122 kg)  BMI 26.46 kg/m2 Gen: Mild distress, tearful. Appears in pain. Abnormal, slowed gait and cautious movement upon getting on/off table.  CV: RRR; pulses equal bilaterally  Chest: CTAB, no wheeze or crackles Abd: Soft. NTND. BS present. No Masses palpated.  Ext: No erythema. No edema.  Skin: No rashes, purpura or petechiae.  Neuro: Abnormal gait, slowed, cautious with mild limp. PERLA. EOMi. Alert. Grossly intact. No focal deficits.  MSK : 5/5 bilateral LE/UE stength. DTR equal bilateral. Left lumbar rotation with notable spasm on left, TTP. Reports pain is worse with flexion. TTP  posterior shoulder tenderpoints and cervical bilaterally. Psych: Tearful. Mildly angry at lack of work up and being fired from [employer]. Otherwise normal speech and affect.      Next visit: Re-check BP; advise life style changes and possible medication if elevated  Consider UDS

## 2013-03-09 ENCOUNTER — Emergency Department (HOSPITAL_COMMUNITY): Payer: Medicaid Other

## 2013-03-09 ENCOUNTER — Encounter (HOSPITAL_COMMUNITY): Payer: Self-pay | Admitting: Emergency Medicine

## 2013-03-09 ENCOUNTER — Emergency Department (HOSPITAL_COMMUNITY): Admission: EM | Admit: 2013-03-09 | Discharge: 2013-03-09 | Discharge status: Against Medical Advice | Payer: Self-pay

## 2013-03-09 ENCOUNTER — Emergency Department (HOSPITAL_COMMUNITY)
Admission: EM | Admit: 2013-03-09 | Discharge: 2013-03-09 | Disposition: A | Discharge status: Home / Self Care | Payer: Medicaid Other | Attending: Emergency Medicine | Admitting: Emergency Medicine

## 2013-03-09 DIAGNOSIS — IMO0002 Reserved for concepts with insufficient information to code with codable children: Secondary | ICD-10-CM | POA: Insufficient documentation

## 2013-03-09 DIAGNOSIS — R6889 Other general symptoms and signs: Secondary | ICD-10-CM | POA: Insufficient documentation

## 2013-03-09 DIAGNOSIS — Z87891 Personal history of nicotine dependence: Secondary | ICD-10-CM | POA: Insufficient documentation

## 2013-03-09 DIAGNOSIS — Z872 Personal history of diseases of the skin and subcutaneous tissue: Secondary | ICD-10-CM | POA: Insufficient documentation

## 2013-03-09 DIAGNOSIS — Y9389 Activity, other specified: Secondary | ICD-10-CM | POA: Insufficient documentation

## 2013-03-09 DIAGNOSIS — T17308A Unspecified foreign body in larynx causing other injury, initial encounter: Secondary | ICD-10-CM

## 2013-03-09 DIAGNOSIS — Z3202 Encounter for pregnancy test, result negative: Secondary | ICD-10-CM | POA: Insufficient documentation

## 2013-03-09 DIAGNOSIS — Z8739 Personal history of other diseases of the musculoskeletal system and connective tissue: Secondary | ICD-10-CM | POA: Insufficient documentation

## 2013-03-09 DIAGNOSIS — R0602 Shortness of breath: Secondary | ICD-10-CM | POA: Insufficient documentation

## 2013-03-09 DIAGNOSIS — R112 Nausea with vomiting, unspecified: Secondary | ICD-10-CM | POA: Insufficient documentation

## 2013-03-09 DIAGNOSIS — G8929 Other chronic pain: Secondary | ICD-10-CM | POA: Insufficient documentation

## 2013-03-09 DIAGNOSIS — Z8659 Personal history of other mental and behavioral disorders: Secondary | ICD-10-CM | POA: Insufficient documentation

## 2013-03-09 DIAGNOSIS — R079 Chest pain, unspecified: Secondary | ICD-10-CM

## 2013-03-09 DIAGNOSIS — Y929 Unspecified place or not applicable: Secondary | ICD-10-CM | POA: Insufficient documentation

## 2013-03-09 DIAGNOSIS — R1110 Vomiting, unspecified: Secondary | ICD-10-CM

## 2013-03-09 LAB — CBC WITH DIFFERENTIAL/PLATELET
Eosinophils Relative: 1 % (ref 0–5)
HCT: 34.7 % — ABNORMAL LOW (ref 36.0–46.0)
Hemoglobin: 11.8 g/dL — ABNORMAL LOW (ref 12.0–15.0)
Lymphocytes Relative: 57 % — ABNORMAL HIGH (ref 12–46)
Lymphs Abs: 3.1 10*3/uL (ref 0.7–4.0)
MCV: 80 fL (ref 78.0–100.0)
Monocytes Absolute: 0.4 10*3/uL (ref 0.1–1.0)
Monocytes Relative: 8 % (ref 3–12)
RBC: 4.34 MIL/uL (ref 3.87–5.11)
WBC: 5.5 10*3/uL (ref 4.0–10.5)

## 2013-03-09 LAB — COMPREHENSIVE METABOLIC PANEL
ALT: 11 U/L (ref 0–35)
BUN: 8 mg/dL (ref 6–23)
CO2: 22 mEq/L (ref 19–32)
Calcium: 9.4 mg/dL (ref 8.4–10.5)
Creatinine, Ser: 0.68 mg/dL (ref 0.50–1.10)
GFR calc Af Amer: >90 mL/min (ref >90)
GFR calc non Af Amer: >90 mL/min (ref >90)
Glucose, Bld: 97 mg/dL (ref 70–99)
Sodium: 139 mEq/L (ref 135–145)

## 2013-03-09 LAB — POCT PREGNANCY, URINE: Preg Test, Ur: NEGATIVE

## 2013-03-09 MED ORDER — IOHEXOL 300 MG/ML  SOLN
100.0000 mL | Freq: Once | INTRAMUSCULAR | Status: AC | PRN
  Administered 2013-03-09: 80 mL via INTRAVENOUS

## 2013-03-09 MED ORDER — GI COCKTAIL ~~LOC~~
30.0000 mL | Freq: Once | ORAL | Status: DC
Start: 2013-03-09 — End: 2013-03-09

## 2013-03-09 MED ORDER — LORAZEPAM 2 MG/ML IJ SOLN
1.0000 mg | Freq: Once | INTRAMUSCULAR | Status: AC
  Administered 2013-03-09: 1 mg via INTRAVENOUS
  Filled 2013-03-09: qty 1

## 2013-03-09 MED ORDER — GI COCKTAIL ~~LOC~~
30.0000 mL | Freq: Once | ORAL | Status: AC
  Administered 2013-03-09: 30 mL via ORAL
  Filled 2013-03-09: qty 30

## 2013-03-09 NOTE — ED Provider Notes (Signed)
CSN: [id number]     Arrival date & time 03/09/13  2001 History   First MD Initiated Contact with Patient 03/09/13 2017     Chief Complaint  Patient presents with  . Chest Pain  . Food stuck in throat    (Consider location/radiation/quality/duration/timing/severity/associated sxs/prior Treatment) HPI Comments: Chest pain after multiple vomiting episodes. Choked on a hamburger then had multiple episodes of vomiting. Swallowing well now, tolerating secretions. Central chest pain/burning now.   Patient is a 42 y.o. female presenting with vomiting. The history is provided by the patient.  Emesis Severity:  Moderate Timing:  Constant Number of daily episodes:  ~8 Quality:  Bilious material and stomach contents Progression:  Worsening Chronicity:  New Recent urination:  Normal Context: not post-tussive and not self-induced   Relieved by:  Nothing Worsened by:  Nothing tried Ineffective treatments:  None tried Associated symptoms: no abdominal pain, no chills, no fever and no headaches     Past Medical History  Diagnosis Date  . Scoliosis   . Complication of anesthesia     difficult waking up "  . Chest pain   . Depression   . Chronic back pain greater than 3 months duration   . Eczema   . Cellulitis    Past Surgical History  Procedure Laterality Date  . Tubal ligation     History reviewed. No pertinent family history. History  Substance Use Topics  . Smoking status: Former Smoker -- 14 years    Quit date: 09/20/2010  . Smokeless tobacco: Never Used  . Alcohol Use: No   OB History   Grav Para Term Preterm Abortions TAB SAB Ect Mult Living                 Review of Systems  Constitutional: Negative for fever and chills.  Respiratory: Positive for shortness of breath. Negative for cough.   Cardiovascular: Positive for chest pain. Negative for leg swelling.  Gastrointestinal: Positive for nausea and vomiting. Negative for abdominal pain.  Neurological: Negative for  headaches.  All other systems reviewed and are negative.    Allergies  Amoxicillin and Tramadol  Home Medications   Current Outpatient Rx  Name  Route  Sig  Dispense  Refill  . cyclobenzaprine (FLEXERIL) 10 MG tablet   Oral   Take 1 tablet (10 mg total) by mouth 2 (two) times daily as needed for muscle spasms.   60 tablet   0   . ibuprofen (ADVIL,MOTRIN) 800 MG tablet   Oral   Take 800 mg by mouth every 8 (eight) hours as needed for pain.         . Menthol, Topical Analgesic, (BIOFREEZE EX)   Apply externally   Apply 1 application topically 3 (three) times daily as needed (back pain.).         Marland Kitchen oxyCODONE-acetaminophen (PERCOCET/ROXICET) 5-325 MG per tablet   Oral   Take 1 tablet by mouth every 8 (eight) hours as needed for pain.   30 tablet   0    BP 157/85  Pulse 83  Resp 21  SpO2 100%  LMP 02/10/2013 Physical Exam  Nursing note and vitals reviewed. Constitutional: She is oriented to person, place, and time. She appears well-developed and well-nourished. No distress.  HENT:  Head: Normocephalic and atraumatic.  Eyes: EOM are normal. Pupils are equal, round, and reactive to light.  Neck: Normal range of motion. Neck supple.  Cardiovascular: Normal rate and regular rhythm.  Exam reveals no friction rub.  No murmur heard. Pulmonary/Chest: Effort normal and breath sounds normal. No respiratory distress. She has no wheezes. She has no rales.  Abdominal: Soft. She exhibits no distension. There is no tenderness. There is no rebound.  Musculoskeletal: Normal range of motion. She exhibits no edema.  Neurological: She is alert and oriented to person, place, and time.  Skin: She is not diaphoretic.    ED Course  Procedures (including critical care time) Labs Review Labs Reviewed  COMPREHENSIVE METABOLIC PANEL - Abnormal; Notable for the following:    Total Bilirubin 0.2 (*)    All other components within normal limits  CBC WITH DIFFERENTIAL  POCT I-STAT  TROPONIN I   Imaging Review Dg Chest 2 View  03/09/2013   CLINICAL DATA:  Chest pain  EXAM: CHEST  2 VIEW  COMPARISON:  08/21/2011  FINDINGS: The heart size and mediastinal contours are within normal limits. Both lungs are clear. The visualized skeletal structures are unremarkable. No pneumothorax or effusion.  IMPRESSION: No active cardiopulmonary disease.   Electronically Signed   By: Oley Balm M.D.   On: 03/09/2013 20:47    EKG Interpretation     Ventricular Rate:  85 PR Interval:  131 QRS Duration: 74 QT Interval:  377 QTC Calculation: 448 R Axis:   68 Text Interpretation:  Sinus rhythm No significant change was found            MDM   1. Vomiting   2. Choking, initial encounter   3. Chest pain    7F here with chest pain. Began after multiple episodes of vomiting after eating a hamburger. Began to choke after eating, had multiple episodes of vomiting with stomach contents then turning bilious. Chest pain began after that. Feels burning sensation in her throat, denies any odynophagia at this time. AFVSS. Patient has hx of panic attack, hyperventilating when I saw her. Lungs clear, heart sounds normal. No subcutaneous emphysema. Belly benign. With chest pain starting s/p multiple episodes of vomiting, will CT Chest to look for perforated esophagus. No concern for food bolus as she is swallowing well now, chest pain described as burning. Tolerated GI cocktail without complication.  CT normal. Feeling much better. Serial troponins normal. Stable for discharge. Likely all secondary to choking.   Dagmar Hait, MD 03/09/13 [phone]

## 2013-03-09 NOTE — ED Notes (Signed)
EKG given to EDP, Walden,MD.

## 2013-03-09 NOTE — ED Notes (Signed)
Pt states that she was eating hamburger at 1600 today and feels like it is stuck. N/V. Chest pain. No airway involvement.

## 2013-05-12 ENCOUNTER — Emergency Department (HOSPITAL_COMMUNITY)
Admission: EM | Admit: 2013-05-12 | Discharge: 2013-05-12 | Disposition: A | Discharge status: Home / Self Care | Payer: Medicaid Other | Attending: Emergency Medicine | Admitting: Emergency Medicine

## 2013-05-12 ENCOUNTER — Emergency Department (HOSPITAL_COMMUNITY): Payer: Medicaid Other

## 2013-05-12 ENCOUNTER — Encounter (HOSPITAL_COMMUNITY): Payer: Self-pay | Admitting: Emergency Medicine

## 2013-05-12 DIAGNOSIS — Y9389 Activity, other specified: Secondary | ICD-10-CM | POA: Insufficient documentation

## 2013-05-12 DIAGNOSIS — M79601 Pain in right arm: Secondary | ICD-10-CM

## 2013-05-12 DIAGNOSIS — Z880 Allergy status to penicillin: Secondary | ICD-10-CM | POA: Insufficient documentation

## 2013-05-12 DIAGNOSIS — S6980XA Other specified injuries of unspecified wrist, hand and finger(s), initial encounter: Secondary | ICD-10-CM | POA: Insufficient documentation

## 2013-05-12 DIAGNOSIS — S4980XA Other specified injuries of shoulder and upper arm, unspecified arm, initial encounter: Secondary | ICD-10-CM | POA: Insufficient documentation

## 2013-05-12 DIAGNOSIS — S6990XA Unspecified injury of unspecified wrist, hand and finger(s), initial encounter: Secondary | ICD-10-CM | POA: Insufficient documentation

## 2013-05-12 DIAGNOSIS — G8929 Other chronic pain: Secondary | ICD-10-CM | POA: Insufficient documentation

## 2013-05-12 DIAGNOSIS — Z8739 Personal history of other diseases of the musculoskeletal system and connective tissue: Secondary | ICD-10-CM | POA: Insufficient documentation

## 2013-05-12 DIAGNOSIS — W2209XA Striking against other stationary object, initial encounter: Secondary | ICD-10-CM | POA: Insufficient documentation

## 2013-05-12 DIAGNOSIS — IMO0001 Reserved for inherently not codable concepts without codable children: Secondary | ICD-10-CM | POA: Insufficient documentation

## 2013-05-12 DIAGNOSIS — M791 Myalgia, unspecified site: Secondary | ICD-10-CM

## 2013-05-12 DIAGNOSIS — S46909A Unspecified injury of unspecified muscle, fascia and tendon at shoulder and upper arm level, unspecified arm, initial encounter: Secondary | ICD-10-CM | POA: Insufficient documentation

## 2013-05-12 DIAGNOSIS — Z872 Personal history of diseases of the skin and subcutaneous tissue: Secondary | ICD-10-CM | POA: Insufficient documentation

## 2013-05-12 DIAGNOSIS — S0993XA Unspecified injury of face, initial encounter: Secondary | ICD-10-CM | POA: Insufficient documentation

## 2013-05-12 DIAGNOSIS — Z87891 Personal history of nicotine dependence: Secondary | ICD-10-CM | POA: Insufficient documentation

## 2013-05-12 DIAGNOSIS — Z79899 Other long term (current) drug therapy: Secondary | ICD-10-CM | POA: Insufficient documentation

## 2013-05-12 DIAGNOSIS — Z8659 Personal history of other mental and behavioral disorders: Secondary | ICD-10-CM | POA: Insufficient documentation

## 2013-05-12 DIAGNOSIS — Y929 Unspecified place or not applicable: Secondary | ICD-10-CM | POA: Insufficient documentation

## 2013-05-12 MED ORDER — METHOCARBAMOL 500 MG PO TABS: 500.0000 mg | ORAL_TABLET | Freq: Two times a day (BID) | ORAL | Status: DC
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
## 2013-05-12 NOTE — ED Notes (Signed)
Pt in c/o right arm pain after hitting her hand on a door two days ago, states the only part she hit was her hand but the pain radiates up her arm

## 2013-05-13 NOTE — ED Provider Notes (Signed)
Medical screening examination/treatment/procedure(s) were performed by non-physician practitioner and as supervising physician I was immediately available for consultation/collaboration.  Flint Melter, MD 05/13/13 [phone]

## 2013-05-19 ENCOUNTER — Encounter: Payer: Self-pay | Admitting: Family Medicine

## 2013-05-19 ENCOUNTER — Ambulatory Visit (INDEPENDENT_AMBULATORY_CARE_PROVIDER_SITE_OTHER): Payer: Self-pay | Admitting: Family Medicine

## 2013-05-19 VITALS — BP 142/78 | HR 68 | Ht 65.0 in | Wt 161.4 lb

## 2013-05-19 DIAGNOSIS — M25541 Pain in joints of right hand: Secondary | ICD-10-CM

## 2013-05-19 DIAGNOSIS — M25549 Pain in joints of unspecified hand: Secondary | ICD-10-CM

## 2013-05-19 NOTE — Patient Instructions (Signed)
Susan Sanders, it was a pleasure seeing you today. Today we talked about your thumb pain. This is most likely still due to your original injury. I will refer you to have a repeat x-ray to make you do not have a fracture that just was not viewable earlier. Please continue your current regimen for keeping your pain controlled. If your hand x-ray looks good, the next step is referral to occupational therapy to help improve the use of your thumb. Please return in one week for follow-up.   If you have any questions or concerns, please do not hesitate to call the office at [phone].  Sincerely,  Cordelia Poche, MD

## 2013-05-19 NOTE — Progress Notes (Signed)
   Subjective:    Patient ID: Susan Sanders, female    DOB: [birth year], 43 y.o.   MRN: [id number]  HPI  Thumb pain Patient recently seen in ED for this complaint. X-ray showed subluxation of 1st metacarpophalangeal joint and no fracture. Patient presents today with continued thumb pain. It is an achy pain that radiates from thumb to her armpit. She reports no tingling, numbness, chest pain. No other joint pain or tenderness. No fevers. She uses a brace and has been taking Tylenol 500mg  q4-6 which helps. Movement does not really aggravate pain, but patient not able to use thumb normally due to pain.   Review of Systems See HPI    Objective:   Physical Exam  Constitutional: She appears well-developed and well-nourished.  Musculoskeletal:       Right shoulder: She exhibits normal range of motion.       Right elbow: She exhibits normal range of motion.       Right wrist: She exhibits normal range of motion, no tenderness, no swelling and no deformity.       Right hand: She exhibits tenderness (mild). She exhibits normal range of motion (no increased lateral movement of 1st metacarpophylangeal joint), no deformity and no swelling. She exhibits no finger abduction and no thumb/finger opposition.          Assessment & Plan:

## 2013-05-20 DIAGNOSIS — M25541 Pain in joints of right hand: Secondary | ICD-10-CM | POA: Insufficient documentation

## 2013-05-20 NOTE — Assessment & Plan Note (Signed)
There does not appear to be a tendon or ligament tear. Will refer for repeat X-ray for possible visualization of fracture since not originally visualized. If repeat X-ray is negative, referral to occupational therapy may be helpful. Will have patient return in one week for follow-up.

## 2013-05-29 ENCOUNTER — Ambulatory Visit (INDEPENDENT_AMBULATORY_CARE_PROVIDER_SITE_OTHER): Payer: Medicaid Other | Admitting: *Deleted

## 2013-05-29 DIAGNOSIS — Z111 Encounter for screening for respiratory tuberculosis: Secondary | ICD-10-CM

## 2013-05-29 NOTE — Progress Notes (Signed)
Tuberculin skin test applied to left ventral forearm.  Patient informed to schedule appt for nurse visit in 48-72 hours to have site read.  Richardson, Jeannette Ann, RN   

## 2013-06-01 ENCOUNTER — Ambulatory Visit: Payer: Self-pay

## 2013-06-24 ENCOUNTER — Ambulatory Visit: Payer: Self-pay

## 2013-09-08 ENCOUNTER — Encounter: Payer: Self-pay | Admitting: Family Medicine

## 2013-09-08 ENCOUNTER — Ambulatory Visit (INDEPENDENT_AMBULATORY_CARE_PROVIDER_SITE_OTHER): Payer: Medicaid Other | Admitting: Family Medicine

## 2013-09-08 VITALS — BP 164/83 | HR 87 | Temp 98.6°F | Ht 65.0 in | Wt 170.0 lb

## 2013-09-08 DIAGNOSIS — R5383 Other fatigue: Secondary | ICD-10-CM

## 2013-09-08 DIAGNOSIS — R5381 Other malaise: Secondary | ICD-10-CM

## 2013-09-08 LAB — CBC
HCT: 35.8 % — ABNORMAL LOW (ref 36.0–46.0)
Hemoglobin: 11.6 g/dL — ABNORMAL LOW (ref 12.0–15.0)
MCH: 26.7 pg (ref 26.0–34.0)
MCHC: 32.4 g/dL (ref 30.0–36.0)
MCV: 82.3 fL (ref 78.0–100.0)
Platelets: 369 10*3/uL (ref 150–400)
RBC: 4.35 MIL/uL (ref 3.87–5.11)
RDW: 15.6 % — AB (ref 11.5–15.5)
WBC: 5.9 10*3/uL (ref 4.0–10.5)

## 2013-09-08 LAB — TSH: TSH: 0.648 u[IU]/mL (ref 0.350–4.500)

## 2013-09-08 MED ORDER — ONDANSETRON 4 MG PO TBDP: 4.0000 mg | ORAL_TABLET | Freq: Three times a day (TID) | ORAL | Status: DC | PRN

## 2013-09-08 MED ORDER — CYCLOBENZAPRINE HCL 10 MG PO TABS: 10.0000 mg | ORAL_TABLET | Freq: Three times a day (TID) | ORAL | Status: DC | PRN

## 2013-09-08 NOTE — Patient Instructions (Signed)
I am sorry you do not feel well today. Lets check some labs to make sure your thyroid and blood count are normal.  I have sent in Zofran for your nausea and upset stomach. I have sent in Flexeril for your muscle tightness. Try a heating pad also.  Let us know if you are not better in 1 week.  Take care! Nelle Sayed M. Zenna Traister, M.D.

## 2013-09-08 NOTE — Progress Notes (Signed)
Patient ID: Susan Sanders, female   DOB: 04/12/1971, 43 y.o.   MRN: [id number]    Subjective: HPI: Patient is a 43 y.o. female presenting to clinic today for generalized body aches.  Body aches- Started having body aches, back pain, headache, nausea, sweating x 5-6 days. She has tried Tylenol PM, OTC cold and flu medication as well as Tylenol sinus. She states she some abdominal discomfort but mostly nausea. Her worst symptom is shoulder/neck pain. She states she has had fevers/chills at home. She endorses diarrhea. She has had sick contacts. Denies coughing, vomiting. She has some skin itching. She endorses some palpitations.   History Reviewed: Some day smoker. Health Maintenance: Did not get flu shot this season  ROS: Please see HPI above.  Objective: Office vital signs reviewed. BP 164/83  Pulse 87  Temp(Src) 98.6 F (37 C) (Oral)  Ht 5\' 5"  (1.651 m)  Wt 170 lb (77.111 kg)  BMI 28.29 kg/m2  Physical Examination:  General: Awake, alert. NAD but appears uncomfortable. HEENT: Atraumatic, normocephalic. MMM. Mild posterior pharyngeal erythema. TM wnl. Neck: No masses palpated. No LAD Pulm: CTAB, no wheezes Cardio: RRR, no murmurs appreciated Abdomen:+BS, soft, nontender, nondistended Extremities: No edema Neuro: Grossly intact  Assessment: 43 y.o. female with fatigue and body aches  Plan: See Problem List and After Visit Summary

## 2013-09-09 ENCOUNTER — Encounter: Payer: Self-pay | Admitting: Family Medicine

## 2013-09-09 DIAGNOSIS — R5383 Other fatigue: Secondary | ICD-10-CM | POA: Insufficient documentation

## 2013-09-09 NOTE — Assessment & Plan Note (Signed)
A: Fatigue and body aches. DDx includes viral syndrome, depression, thyroid dysfunction, anemia  P: - Given reassurance - TSH, CBC today - Push fluids, Tylenol as needed for body aches - F/u if fails to improve

## 2013-11-09 ENCOUNTER — Encounter (HOSPITAL_COMMUNITY): Payer: Self-pay | Admitting: Emergency Medicine

## 2013-11-09 ENCOUNTER — Emergency Department (HOSPITAL_COMMUNITY)
Admission: EM | Admit: 2013-11-09 | Discharge: 2013-11-09 | Discharge status: Against Medical Advice | Payer: Medicaid Other | Attending: Emergency Medicine | Admitting: Emergency Medicine

## 2013-11-09 DIAGNOSIS — S0993XA Unspecified injury of face, initial encounter: Secondary | ICD-10-CM | POA: Insufficient documentation

## 2013-11-09 DIAGNOSIS — S59909A Unspecified injury of unspecified elbow, initial encounter: Secondary | ICD-10-CM | POA: Diagnosis not present

## 2013-11-09 DIAGNOSIS — G8929 Other chronic pain: Secondary | ICD-10-CM | POA: Diagnosis not present

## 2013-11-09 DIAGNOSIS — S59919A Unspecified injury of unspecified forearm, initial encounter: Secondary | ICD-10-CM

## 2013-11-09 DIAGNOSIS — IMO0002 Reserved for concepts with insufficient information to code with codable children: Secondary | ICD-10-CM | POA: Insufficient documentation

## 2013-11-09 DIAGNOSIS — S199XXA Unspecified injury of neck, initial encounter: Principal | ICD-10-CM

## 2013-11-09 DIAGNOSIS — S6990XA Unspecified injury of unspecified wrist, hand and finger(s), initial encounter: Secondary | ICD-10-CM
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
## 2013-11-29 ENCOUNTER — Emergency Department (INDEPENDENT_AMBULATORY_CARE_PROVIDER_SITE_OTHER): Payer: Medicaid Other

## 2013-11-29 ENCOUNTER — Encounter (HOSPITAL_COMMUNITY): Payer: Self-pay | Admitting: Emergency Medicine

## 2013-11-29 ENCOUNTER — Emergency Department (INDEPENDENT_AMBULATORY_CARE_PROVIDER_SITE_OTHER)
Admission: EM | Admit: 2013-11-29 | Discharge: 2013-11-29 | Disposition: A | Discharge status: Home / Self Care | Payer: Medicaid Other | Source: Home / Self Care | Attending: Family Medicine | Admitting: Family Medicine

## 2013-11-29 DIAGNOSIS — M545 Low back pain, unspecified: Secondary | ICD-10-CM

## 2013-11-29 DIAGNOSIS — T7411XA Adult physical abuse, confirmed, initial encounter: Secondary | ICD-10-CM

## 2013-11-29 DIAGNOSIS — IMO0002 Reserved for concepts with insufficient information to code with codable children: Secondary | ICD-10-CM

## 2013-11-29 MED ORDER — CYCLOBENZAPRINE HCL 5 MG PO TABS: 5.0000 mg | ORAL_TABLET | Freq: Every evening | ORAL | Status: DC | PRN

## 2013-11-29 MED ORDER — PREDNISONE 5 MG PO KIT: PACK | ORAL | Status: DC
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
## 2013-12-07 ENCOUNTER — Ambulatory Visit (INDEPENDENT_AMBULATORY_CARE_PROVIDER_SITE_OTHER): Payer: Medicaid Other | Admitting: *Deleted

## 2013-12-07 ENCOUNTER — Telehealth: Payer: Self-pay | Admitting: Family Medicine

## 2013-12-07 DIAGNOSIS — Z111 Encounter for screening for respiratory tuberculosis: Secondary | ICD-10-CM

## 2013-12-07 NOTE — Telephone Encounter (Signed)
After looking in patient's chart,he looks like she never came in to have TB Test read.Patient was informed,she was upset because she states no one explained that she needed  to come in.patient was advised to schedule nurse visit to have repeat TB test.she voiced understanding.[name], Lewie Loron

## 2013-12-07 NOTE — Telephone Encounter (Signed)
Pt called and needs a copy of her PPD reading left up front for pickup. Please call when ready [phone]. jw

## 2013-12-09 ENCOUNTER — Ambulatory Visit: Payer: Medicaid Other | Admitting: *Deleted

## 2013-12-09 DIAGNOSIS — Z111 Encounter for screening for respiratory tuberculosis: Secondary | ICD-10-CM

## 2013-12-09 NOTE — Progress Notes (Signed)
PPD negative

## 2013-12-10 ENCOUNTER — Encounter: Payer: Self-pay | Admitting: Family Medicine

## 2013-12-10 ENCOUNTER — Ambulatory Visit (INDEPENDENT_AMBULATORY_CARE_PROVIDER_SITE_OTHER): Payer: Medicaid Other | Admitting: Family Medicine

## 2013-12-10 ENCOUNTER — Other Ambulatory Visit (HOSPITAL_COMMUNITY)
Admission: RE | Admit: 2013-12-10 | Discharge: 2013-12-10 | Disposition: A | Discharge status: Home / Self Care | Payer: Medicaid Other | Source: Ambulatory Visit | Attending: Family Medicine | Admitting: Family Medicine

## 2013-12-10 VITALS — BP 142/72 | HR 86 | Temp 98.0°F | Ht 65.0 in | Wt 172.0 lb

## 2013-12-10 DIAGNOSIS — Z0000 Encounter for general adult medical examination without abnormal findings: Secondary | ICD-10-CM

## 2013-12-10 DIAGNOSIS — Z124 Encounter for screening for malignant neoplasm of cervix: Secondary | ICD-10-CM

## 2013-12-10 DIAGNOSIS — Z1151 Encounter for screening for human papillomavirus (HPV): Secondary | ICD-10-CM | POA: Diagnosis present

## 2013-12-10 DIAGNOSIS — Z01419 Encounter for gynecological examination (general) (routine) without abnormal findings: Secondary | ICD-10-CM | POA: Insufficient documentation

## 2013-12-10 DIAGNOSIS — M549 Dorsalgia, unspecified: Secondary | ICD-10-CM | POA: Diagnosis not present

## 2013-12-10 DIAGNOSIS — Z23 Encounter for immunization: Secondary | ICD-10-CM

## 2013-12-10 DIAGNOSIS — K219 Gastro-esophageal reflux disease without esophagitis: Secondary | ICD-10-CM | POA: Insufficient documentation

## 2013-12-10 DIAGNOSIS — Z87891 Personal history of nicotine dependence: Secondary | ICD-10-CM

## 2013-12-10 DIAGNOSIS — F17200 Nicotine dependence, unspecified, uncomplicated: Secondary | ICD-10-CM

## 2013-12-10 LAB — LIPID PANEL
CHOL/HDL RATIO: 2.7 ratio
Cholesterol: 191 mg/dL (ref 0–200)
HDL: 71 mg/dL (ref >39)
LDL Cholesterol: 105 mg/dL — ABNORMAL HIGH (ref 0–99)
Triglycerides: 73 mg/dL (ref <150)
VLDL: 15 mg/dL (ref 0–40)

## 2013-12-10 MED ORDER — OMEPRAZOLE 20 MG PO CPDR: 20.0000 mg | DELAYED_RELEASE_CAPSULE | Freq: Every day | ORAL | Status: DC
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
## 2013-12-10 NOTE — Addendum Note (Signed)
Addended by: Howard Pouch A on: 12/10/2013 11:12 AM   Modules accepted: Orders

## 2013-12-10 NOTE — Assessment & Plan Note (Signed)
HIV, HEP and PAP today Lipids today Tetanus today Mammogram info given to pt. Early screening advised with family history.

## 2013-12-10 NOTE — Patient Instructions (Signed)
Mammography Mammography is an X-ray of the breasts to look for changes that are not normal. The X-ray image is called a mammogram. This procedure can screen for breast cancer, can detect cancer early, and can diagnose cancer.  LET YOUR CAREGIVER KNOW ABOUT:  Breast implants.  Previous breast disease, biopsy, or surgery.  If you are breastfeeding.  Medicines taken, including vitamins, herbs, eyedrops, over-the-counter medicines, and creams.  Use of steroids (by mouth or creams).  Possibility of pregnancy, if this applies. RISKS AND COMPLICATIONS  Exposure to radiation, but at very low levels.  The results may be misinterpreted.  The results may not be accurate.  Mammography may lead to further tests.  Mammography may not catch certain cancers. BEFORE THE PROCEDURE  Schedule your test about 7 days after your menstrual period. This is when your breasts are the least tender and have signs of hormone changes.  If you have had a mammography done at a different facility in the past, get the mammogram X-rays or have them sent to your current exam facility in order to compare them.  Wash your breasts and under your arms the day of the test.  Do not wear deodorants, perfumes, or powders anywhere on your body.  Wear clothes that you can change in and out of easily. PROCEDURE Relax as much as possible during the test. Any discomfort during the test will be very brief. The test should take less than 30 minutes. The following will happen:  You will undress from the waist up and put on a gown.  You will stand in front of the X-ray machine.  Each breast will be placed between 2 plastic or glass plates. The plates will compress your breast for a few seconds.  X-rays will be taken from different angles of the breast. AFTER THE PROCEDURE  The mammogram will be examined.  Depending on the quality of the images, you may need to repeat certain parts of the test.  Ask when your test  results will be ready. Make sure you get your test results.  You may resume normal activities. Document Released: 04/27/2000 Document Revised: 07/23/2011 Document Reviewed: 02/18/2011 [facility] Patient Information 2015 [place], [place]. This information is not intended to replace advice given to you by your health care provider. Make sure you discuss any questions you have with your health care provider.  Low-Sodium Eating Plan Sodium raises blood pressure and causes water to be held in the body. Getting less sodium from food will help lower your blood pressure, reduce any swelling, and protect your heart, liver, and kidneys. We get sodium by adding salt (sodium chloride) to food. Most of our sodium comes from canned, boxed, and frozen foods. Restaurant foods, fast foods, and pizza are also very high in sodium. Even if you take medicine to lower your blood pressure or to reduce fluid in your body, getting less sodium from your food is important. WHAT IS MY PLAN? Most people should limit their sodium intake to 2,300 mg a day. Your health care provider recommends that you limit your sodium intake to __________ a day.  WHAT DO I NEED TO KNOW ABOUT THIS EATING PLAN? For the low-sodium eating plan, you will follow these general guidelines:  Choose foods with a % Daily Value for sodium of less than 5% (as listed on the food label).   Use salt-free seasonings or herbs instead of table salt or sea salt.   Check with your health care provider or pharmacist before using salt substitutes.  Eat fresh foods.  Eat more vegetables and fruits.  Limit canned vegetables. If you do use them, rinse them well to decrease the sodium.   Limit cheese to 1 oz (28 g) per day.   Eat lower-sodium products, often labeled as "lower sodium" or "no salt added."  Avoid foods that contain monosodium glutamate (MSG). MSG is sometimes added to [place] food and some canned foods.  Check food labels (Nutrition Facts  labels) on foods to learn how much sodium is in one serving.  Eat more home-cooked food and less restaurant, buffet, and fast food.  When eating at a restaurant, ask that your food be prepared with less salt or none, if possible.  HOW DO I READ FOOD LABELS FOR SODIUM INFORMATION? The Nutrition Facts label lists the amount of sodium in one serving of the food. If you eat more than one serving, you must multiply the listed amount of sodium by the number of servings. Food labels may also identify foods as:  Sodium free--Less than 5 mg in a serving.  Very low sodium--35 mg or less in a serving.  Low sodium--140 mg or less in a serving.  Light in sodium--50% less sodium in a serving. For example, if a food that usually has 300 mg of sodium is changed to become light in sodium, it will have 150 mg of sodium.  Reduced sodium--25% less sodium in a serving. For example, if a food that usually has 400 mg of sodium is changed to reduced sodium, it will have 300 mg of sodium. WHAT FOODS CAN I EAT? Grains Low-sodium cereals, including oats, puffed wheat and rice, and shredded wheat cereals. Low-sodium crackers. Unsalted rice and pasta. Lower-sodium bread.  Vegetables Frozen or fresh vegetables. Low-sodium or reduced-sodium canned vegetables. Low-sodium or reduced-sodium tomato sauce and paste. Low-sodium or reduced-sodium tomato and vegetable juices.  Fruits Fresh, frozen, and canned fruit. Fruit juice.  Meat and Other Protein Products Low-sodium canned tuna and salmon. Fresh or frozen meat, poultry, seafood, and fish. Lamb. Unsalted nuts. Dried beans, peas, and lentils without added salt. Unsalted canned beans. Homemade soups without salt. Eggs.  Dairy Milk. Soy milk. Ricotta cheese. Low-sodium or reduced-sodium cheeses. Yogurt.  Condiments Fresh and dried herbs and spices. Salt-free seasonings. Onion and garlic powders. Low-sodium varieties of mustard and ketchup. Lemon juice.  Fats and  Oils Reduced-sodium salad dressings. Unsalted butter.  Other Unsalted popcorn and pretzels.  The items listed above may not be a complete list of recommended foods or beverages. Contact your dietitian for more options. WHAT FOODS ARE NOT RECOMMENDED? Grains Instant hot cereals. Bread stuffing, pancake, and biscuit mixes. Croutons. Seasoned rice or pasta mixes. Noodle soup cups. Boxed or frozen macaroni and cheese. Self-rising flour. Regular salted crackers. Vegetables Regular canned vegetables. Regular canned tomato sauce and paste. Regular tomato and vegetable juices. Frozen vegetables in sauces. Salted french fries. Olives. Angie Fava. Relishes. Sauerkraut. Salsa. Meat and Other Protein Products Salted, canned, smoked, spiced, or pickled meats, seafood, or fish. Bacon, ham, sausage, hot dogs, corned beef, chipped beef, and packaged luncheon meats. Salt pork. Jerky. Pickled herring. Anchovies, regular canned tuna, and sardines. Salted nuts. Dairy Processed cheese and cheese spreads. Cheese curds. Blue cheese and cottage cheese. Buttermilk.  Condiments Onion and garlic salt, seasoned salt, table salt, and sea salt. Canned and packaged gravies. Worcestershire sauce. Tartar sauce. Barbecue sauce. Teriyaki sauce. Soy sauce, including reduced sodium. Steak sauce. Fish sauce. Oyster sauce. Cocktail sauce. Horseradish. Regular ketchup and mustard. Meat flavorings and tenderizers.  Bouillon cubes. Hot sauce. Tabasco sauce. Marinades. Taco seasonings. Relishes. Fats and Oils Regular salad dressings. Salted butter. Margarine. Ghee. Bacon fat.  Other Potato and tortilla chips. Corn chips and puffs. Salted popcorn and pretzels. Canned or dried soups. Pizza. Frozen entrees and pot pies.  The items listed above may not be a complete list of foods and beverages to avoid. Contact your dietitian for more information. Document Released: 10/20/2001 Document Revised: 05/05/2013 Document Reviewed:  03/04/2013 [facility] Patient Information 2015 [place], [place]. This information is not intended to replace advice given to you by your health care provider. Make sure you discuss any questions you have with your health care provider.  It was a pleasure seeing you today.   Please follow up with scheduling a mammogram. I will call you with your lab results.  Try to exercise at least 150 minutes a week. Watch your salt intake

## 2013-12-10 NOTE — Assessment & Plan Note (Signed)
Pt recently quit. Congratulations.

## 2013-12-10 NOTE — Assessment & Plan Note (Signed)
Pt quit smoking. Congratulated her on her success.

## 2013-12-10 NOTE — Assessment & Plan Note (Signed)
PAP, Hep HIV completed today

## 2013-12-11 ENCOUNTER — Other Ambulatory Visit: Payer: Self-pay

## 2013-12-11 DIAGNOSIS — Z1231 Encounter for screening mammogram for malignant neoplasm of breast: Secondary | ICD-10-CM

## 2013-12-11 LAB — ACUTE HEP PANEL AND HEP B SURFACE AB
HCV Ab: NEGATIVE
HEP B S AB: POSITIVE — AB
Hep A IgM: NONREACTIVE
Hep B C IgM: NONREACTIVE
Hepatitis B Surface Ag: NEGATIVE

## 2013-12-11 LAB — HIV ANTIBODY (ROUTINE TESTING W REFLEX): HIV 1&2 Ab, 4th Generation: NONREACTIVE

## 2013-12-14 ENCOUNTER — Telehealth: Payer: Self-pay | Admitting: Family Medicine

## 2013-12-14 LAB — CYTOLOGY - PAP

## 2013-12-14 NOTE — Telephone Encounter (Signed)
Please call pt and inform her labs looked good. PAP is normal. Thanks

## 2013-12-14 NOTE — Telephone Encounter (Signed)
Left message with son for a return call.Susan Sanders, Susan Sanders

## 2013-12-24 ENCOUNTER — Ambulatory Visit: Payer: Self-pay

## 2014-01-12 ENCOUNTER — Ambulatory Visit: Payer: Self-pay

## 2014-03-28 ENCOUNTER — Emergency Department (HOSPITAL_COMMUNITY): Payer: Medicaid Other

## 2014-03-28 ENCOUNTER — Encounter (HOSPITAL_COMMUNITY): Payer: Self-pay | Admitting: *Deleted

## 2014-03-28 ENCOUNTER — Emergency Department (HOSPITAL_COMMUNITY)
Admission: EM | Admit: 2014-03-28 | Discharge: 2014-03-28 | Disposition: A | Discharge status: Home / Self Care | Payer: Medicaid Other | Attending: Emergency Medicine | Admitting: Emergency Medicine

## 2014-03-28 ENCOUNTER — Emergency Department (INDEPENDENT_AMBULATORY_CARE_PROVIDER_SITE_OTHER)
Admission: EM | Admit: 2014-03-28 | Discharge: 2014-03-28 | Disposition: A | Discharge status: Nursing Home (Includes ICF) | Payer: Medicaid Other | Source: Home / Self Care | Attending: Emergency Medicine | Admitting: Emergency Medicine

## 2014-03-28 DIAGNOSIS — Z8659 Personal history of other mental and behavioral disorders: Secondary | ICD-10-CM | POA: Diagnosis not present

## 2014-03-28 DIAGNOSIS — G8929 Other chronic pain: Secondary | ICD-10-CM | POA: Insufficient documentation

## 2014-03-28 DIAGNOSIS — Z79899 Other long term (current) drug therapy: Secondary | ICD-10-CM | POA: Insufficient documentation

## 2014-03-28 DIAGNOSIS — R1030 Lower abdominal pain, unspecified: Secondary | ICD-10-CM

## 2014-03-28 DIAGNOSIS — Z87891 Personal history of nicotine dependence: Secondary | ICD-10-CM | POA: Insufficient documentation

## 2014-03-28 DIAGNOSIS — Z872 Personal history of diseases of the skin and subcutaneous tissue: Secondary | ICD-10-CM | POA: Diagnosis not present

## 2014-03-28 DIAGNOSIS — M419 Scoliosis, unspecified: Secondary | ICD-10-CM | POA: Insufficient documentation

## 2014-03-28 DIAGNOSIS — R102 Pelvic and perineal pain: Secondary | ICD-10-CM

## 2014-03-28 DIAGNOSIS — D259 Leiomyoma of uterus, unspecified: Secondary | ICD-10-CM | POA: Diagnosis not present

## 2014-03-28 DIAGNOSIS — Z880 Allergy status to penicillin: Secondary | ICD-10-CM | POA: Insufficient documentation

## 2014-03-28 LAB — CBC WITH DIFFERENTIAL/PLATELET
BASOS PCT: 0 % (ref 0–1)
Basophils Absolute: 0 10*3/uL (ref 0.0–0.1)
EOS ABS: 0.1 10*3/uL (ref 0.0–0.7)
Eosinophils Relative: 1 % (ref 0–5)
HCT: 36.3 % (ref 36.0–46.0)
Hemoglobin: 12 g/dL (ref 12.0–15.0)
Lymphocytes Relative: 28 % (ref 12–46)
Lymphs Abs: 1.9 10*3/uL (ref 0.7–4.0)
MCH: 27.5 pg (ref 26.0–34.0)
MCHC: 33.1 g/dL (ref 30.0–36.0)
MCV: 83.3 fL (ref 78.0–100.0)
Monocytes Absolute: 0.5 10*3/uL (ref 0.1–1.0)
Monocytes Relative: 8 % (ref 3–12)
NEUTROS PCT: 63 % (ref 43–77)
Neutro Abs: 4.3 10*3/uL (ref 1.7–7.7)
PLATELETS: 352 10*3/uL (ref 150–400)
RBC: 4.36 MIL/uL (ref 3.87–5.11)
RDW: 14 % (ref 11.5–15.5)
WBC: 6.8 10*3/uL (ref 4.0–10.5)

## 2014-03-28 LAB — POCT URINALYSIS DIP (DEVICE)
Glucose, UA: NEGATIVE mg/dL
Ketones, ur: NEGATIVE mg/dL
Leukocytes, UA: NEGATIVE
NITRITE: NEGATIVE
Protein, ur: 30 mg/dL — AB
Specific Gravity, Urine: >=1.03 (ref 1.005–1.030)
Urobilinogen, UA: 0.2 mg/dL (ref 0.0–1.0)
pH: 5 (ref 5.0–8.0)

## 2014-03-28 LAB — COMPREHENSIVE METABOLIC PANEL
ALBUMIN: 4 g/dL (ref 3.5–5.2)
ALT: 10 U/L (ref 0–35)
ANION GAP: 14 (ref 5–15)
AST: 14 U/L (ref 0–37)
Alkaline Phosphatase: 49 U/L (ref 39–117)
BUN: 9 mg/dL (ref 6–23)
CO2: 22 mEq/L (ref 19–32)
Calcium: 9.3 mg/dL (ref 8.4–10.5)
Chloride: 102 mEq/L (ref 96–112)
Creatinine, Ser: 0.74 mg/dL (ref 0.50–1.10)
GFR calc Af Amer: >90 mL/min (ref >90)
GFR calc non Af Amer: >90 mL/min (ref >90)
Glucose, Bld: 82 mg/dL (ref 70–99)
POTASSIUM: 4.1 meq/L (ref 3.7–5.3)
Sodium: 138 mEq/L (ref 137–147)
TOTAL PROTEIN: 7.2 g/dL (ref 6.0–8.3)
Total Bilirubin: 0.3 mg/dL (ref 0.3–1.2)

## 2014-03-28 LAB — WET PREP, GENITAL
Trich, Wet Prep: NONE SEEN
Yeast Wet Prep HPF POC: NONE SEEN

## 2014-03-28 LAB — URINALYSIS, ROUTINE W REFLEX MICROSCOPIC
Bilirubin Urine: NEGATIVE
Glucose, UA: NEGATIVE mg/dL
Hgb urine dipstick: NEGATIVE
Ketones, ur: 15 mg/dL — AB
Leukocytes, UA: NEGATIVE
Nitrite: NEGATIVE
Protein, ur: NEGATIVE mg/dL
SPECIFIC GRAVITY, URINE: 1.027 (ref 1.005–1.030)
Urobilinogen, UA: 0.2 mg/dL (ref 0.0–1.0)
pH: 5 (ref 5.0–8.0)

## 2014-03-28 LAB — POCT PREGNANCY, URINE: Preg Test, Ur: NEGATIVE

## 2014-03-28 MED ORDER — KETOROLAC TROMETHAMINE 30 MG/ML IJ SOLN
30.0000 mg | Freq: Once | INTRAMUSCULAR | Status: AC
  Administered 2014-03-28: 30 mg via INTRAVENOUS
  Filled 2014-03-28: qty 1

## 2014-03-28 MED ORDER — HYDROMORPHONE HCL 1 MG/ML IJ SOLN
1.0000 mg | Freq: Once | INTRAMUSCULAR | Status: AC
  Administered 2014-03-28: 1 mg via INTRAVENOUS
  Filled 2014-03-28: qty 1

## 2014-03-28 MED ORDER — OXYCODONE-ACETAMINOPHEN 5-325 MG PO TABS
2.0000 | ORAL_TABLET | Freq: Once | ORAL | Status: AC
  Administered 2014-03-28: 2 via ORAL
  Filled 2014-03-28: qty 2

## 2014-03-28 MED ORDER — HYDROMORPHONE HCL 1 MG/ML IJ SOLN: 2.0000 mg | Freq: Once | INTRAMUSCULAR | Status: DC

## 2014-03-28 MED ORDER — HYDROMORPHONE HCL 1 MG/ML IJ SOLN
2.0000 mg | Freq: Once | INTRAMUSCULAR | Status: AC
  Administered 2014-03-28: 2 mg via INTRAVENOUS

## 2014-03-28 MED ORDER — OXYCODONE-ACETAMINOPHEN 5-325 MG PO TABS: 1.0000 | ORAL_TABLET | Freq: Four times a day (QID) | ORAL | Status: DC | PRN

## 2014-03-28 MED ORDER — HYDROMORPHONE HCL 1 MG/ML IJ SOLN
INTRAMUSCULAR | Status: AC
  Filled 2014-03-28: qty 2

## 2014-03-28 MED ORDER — SODIUM CHLORIDE 0.9 % IV SOLN
INTRAVENOUS | Status: DC
  Administered 2014-03-28: 11:00:00 via INTRAVENOUS

## 2014-03-28 MED ORDER — ONDANSETRON 4 MG PO TBDP
ORAL_TABLET | ORAL | Status: AC
  Filled 2014-03-28: qty 2

## 2014-03-28 MED ORDER — IBUPROFEN 800 MG PO TABS: 800.0000 mg | ORAL_TABLET | Freq: Three times a day (TID) | ORAL | Status: DC

## 2014-03-28 MED ORDER — ONDANSETRON 4 MG PO TBDP
8.0000 mg | ORAL_TABLET | Freq: Once | ORAL | Status: AC
  Administered 2014-03-28: 8 mg via ORAL

## 2014-03-28 NOTE — ED Provider Notes (Signed)
CSN: [id number]     Arrival date & time 03/28/14  1129 History   First MD Initiated Contact with Patient 03/28/14 1134     Chief Complaint  Patient presents with  . Abdominal Pain     (Consider location/radiation/quality/duration/timing/severity/associated sxs/prior Treatment) HPI Comments: Patient with history of ovarian cyst, tubal ligation, no other previous abdominal surgeries-presents with complaint of lower abdominal cramping, severe, with radiation to back and down into her legs. Patient states that this pain began last night with her menstrual period and gradually became worse. She was seen at urgent care and was transferred to the emergency department for evaluation due to her severe pain. Yesterday she had some soft stools but no watery stools or bloody stools. She has not had documented fever, nausea, vomiting. No chest pain or shortness of breath. No vaginal discharge other than blood. No urinary symptoms. No recent sexual activity. She has used heating pad on her lower abdomen without relief.  The history is provided by the patient and medical records.    Past Medical History  Diagnosis Date  . Scoliosis   . Complication of anesthesia     difficult waking up "  . Chest pain   . Depression   . Chronic back pain greater than 3 months duration   . Eczema   . Cellulitis    Past Surgical History  Procedure Laterality Date  . Tubal ligation     No family history on file. History  Substance Use Topics  . Smoking status: Former Smoker -- 14 years    Quit date: 09/20/2010  . Smokeless tobacco: Never Used  . Alcohol Use: No   OB History    No data available     Review of Systems  Constitutional: Negative for fever.  HENT: Negative for rhinorrhea and sore throat.   Eyes: Negative for redness.  Respiratory: Negative for cough.   Cardiovascular: Negative for chest pain.  Gastrointestinal: Positive for abdominal pain. Negative for nausea, vomiting and diarrhea.   Genitourinary: Positive for vaginal bleeding. Negative for dysuria.  Musculoskeletal: Positive for back pain. Negative for myalgias.  Skin: Negative for rash.  Neurological: Negative for headaches.   Allergies  Amoxicillin; Naproxen; and Tramadol  Home Medications   Prior to Admission medications   Medication Sig Start Date End Date Taking? Authorizing Provider  cyclobenzaprine (FLEXERIL) 5 MG tablet Take 1 tablet (5 mg total) by mouth at bedtime as needed for muscle spasms. 11/29/13   Gregor Hams, MD  ibuprofen (ADVIL,MOTRIN) 200 MG tablet Take 800 mg by mouth every 8 (eight) hours as needed for mild pain.     Historical Provider, MD  Multiple Vitamin (MULTIVITAMIN WITH MINERALS) TABS tablet Take 1 tablet by mouth daily.    Historical Provider, MD  omeprazole (PRILOSEC) 20 MG capsule Take 1 capsule (20 mg total) by mouth daily. 12/10/13   Renee A Kuneff, DO   Pulse 81  Temp(Src) 97.6 F (36.4 C) (Oral)  Resp 24  SpO2 100%  LMP 03/27/2014 (Exact Date)   Physical Exam  Constitutional: She appears well-developed and well-nourished. She appears distressed (patient rolling in bed).  HENT:  Head: Normocephalic and atraumatic.  Eyes: Conjunctivae are normal. Right eye exhibits no discharge. Left eye exhibits no discharge.  Neck: Normal range of motion. Neck supple.  Cardiovascular: Normal rate, regular rhythm and normal heart sounds.   No murmur heard. Pulmonary/Chest: Effort normal and breath sounds normal. No respiratory distress. She has no wheezes. She has no rales.  Abdominal: Soft. She exhibits no distension. There is tenderness (pelvis, moderate). There is no rebound and no guarding.  Genitourinary: Uterus is tender. Uterus is not deviated and not enlarged. Cervix exhibits discharge (blood). Cervix exhibits no motion tenderness. Right adnexum displays tenderness. Right adnexum displays no mass. Left adnexum displays tenderness. Left adnexum displays no mass. There is bleeding in  the vagina. No erythema or tenderness in the vagina. No foreign body around the vagina. No vaginal discharge found.  Neurological: She is alert.  Skin: Skin is warm and dry.  Psychiatric: She has a normal mood and affect.  Nursing note and vitals reviewed.   ED Course  Procedures (including critical care time) Labs Review Labs Reviewed  WET PREP, GENITAL - Abnormal; Notable for the following:    Clue Cells Wet Prep HPF POC FEW (*)    WBC, Wet Prep HPF POC MANY (*)    All other components within normal limits  URINALYSIS, ROUTINE W REFLEX MICROSCOPIC - Abnormal; Notable for the following:    Ketones, ur 15 (*)    All other components within normal limits  GC/CHLAMYDIA PROBE AMP  CBC WITH DIFFERENTIAL  COMPREHENSIVE METABOLIC PANEL    Imaging Review US Transvaginal Non-ob  03/28/2014   CLINICAL DATA:  43 year old female with severe bilateral pelvic pain, nausea and diarrhea  EXAM: TRANSABDOMINAL AND TRANSVAGINAL ULTRASOUND OF PELVIS  DOPPLER ULTRASOUND OF OVARIES  TECHNIQUE: Both transabdominal and transvaginal ultrasound examinations of the pelvis were performed. Transabdominal technique was performed for global imaging of the pelvis including uterus, ovaries, adnexal regions, and pelvic cul-de-sac.  It was necessary to proceed with endovaginal exam following the transabdominal exam to visualize the endometrium. Color and duplex Doppler ultrasound was utilized to evaluate blood flow to the ovaries.  COMPARISON:  Prior pelvic ultrasound 09/24/2008  FINDINGS: Uterus  Measurements: 12.4 x 7.8 x 6.9 cm. At least 2 heterogeneous, hypoechoic solid masses are identified consistent with uterine fibroids. The larger lesion is located in the sub serosal posterior uterus to the right of midline and measures 5.6 x 4.6 x 5.3 cm. A smaller 1.8 x 1.9 x 1.5 cm fibroid is present in the intramural anterior fundus to the left of midline.  Endometrium  Thickness: 6 mm.  No focal abnormality visualized.  Right  ovary  Measurements: 2.6 x 1.5 x 2.0 cm. Normal appearance/no adnexal mass.  Left ovary  Measurements: 2.6 x 1.3 x 3.4 cm. Normal appearance/no adnexal mass.  Pulsed Doppler evaluation of both ovaries demonstrates normal low-resistance arterial and venous waveforms.  Other findings  No free fluid.  IMPRESSION: 1. No evidence of ovarian torsion or other acute abnormality at this time. 2. At least 2 uterine fibroids are identified as described above.   Electronically Signed   By: Jacqulynn Cadet M.D.   On: 03/28/2014 16:15   US Pelvis Complete  03/28/2014   CLINICAL DATA:  43 year old female with severe bilateral pelvic pain, nausea and diarrhea  EXAM: TRANSABDOMINAL AND TRANSVAGINAL ULTRASOUND OF PELVIS  DOPPLER ULTRASOUND OF OVARIES  TECHNIQUE: Both transabdominal and transvaginal ultrasound examinations of the pelvis were performed. Transabdominal technique was performed for global imaging of the pelvis including uterus, ovaries, adnexal regions, and pelvic cul-de-sac.  It was necessary to proceed with endovaginal exam following the transabdominal exam to visualize the endometrium. Color and duplex Doppler ultrasound was utilized to evaluate blood flow to the ovaries.  COMPARISON:  Prior pelvic ultrasound 09/24/2008  FINDINGS: Uterus  Measurements: 12.4 x 7.8 x 6.9 cm. At least  2 heterogeneous, hypoechoic solid masses are identified consistent with uterine fibroids. The larger lesion is located in the sub serosal posterior uterus to the right of midline and measures 5.6 x 4.6 x 5.3 cm. A smaller 1.8 x 1.9 x 1.5 cm fibroid is present in the intramural anterior fundus to the left of midline.  Endometrium  Thickness: 6 mm.  No focal abnormality visualized.  Right ovary  Measurements: 2.6 x 1.5 x 2.0 cm. Normal appearance/no adnexal mass.  Left ovary  Measurements: 2.6 x 1.3 x 3.4 cm. Normal appearance/no adnexal mass.  Pulsed Doppler evaluation of both ovaries demonstrates normal low-resistance arterial and  venous waveforms.  Other findings  No free fluid.  IMPRESSION: 1. No evidence of ovarian torsion or other acute abnormality at this time. 2. At least 2 uterine fibroids are identified as described above.   Electronically Signed   By: Jacqulynn Cadet M.D.   On: 03/28/2014 16:15   [place] Art/ven Flow Abd Pelv Doppler  03/28/2014   CLINICAL DATA:  43 year old female with severe bilateral pelvic pain, nausea and diarrhea  EXAM: TRANSABDOMINAL AND TRANSVAGINAL ULTRASOUND OF PELVIS  DOPPLER ULTRASOUND OF OVARIES  TECHNIQUE: Both transabdominal and transvaginal ultrasound examinations of the pelvis were performed. Transabdominal technique was performed for global imaging of the pelvis including uterus, ovaries, adnexal regions, and pelvic cul-de-sac.  It was necessary to proceed with endovaginal exam following the transabdominal exam to visualize the endometrium. Color and duplex Doppler ultrasound was utilized to evaluate blood flow to the ovaries.  COMPARISON:  Prior pelvic ultrasound 09/24/2008  FINDINGS: Uterus  Measurements: 12.4 x 7.8 x 6.9 cm. At least 2 heterogeneous, hypoechoic solid masses are identified consistent with uterine fibroids. The larger lesion is located in the sub serosal posterior uterus to the right of midline and measures 5.6 x 4.6 x 5.3 cm. A smaller 1.8 x 1.9 x 1.5 cm fibroid is present in the intramural anterior fundus to the left of midline.  Endometrium  Thickness: 6 mm.  No focal abnormality visualized.  Right ovary  Measurements: 2.6 x 1.5 x 2.0 cm. Normal appearance/no adnexal mass.  Left ovary  Measurements: 2.6 x 1.3 x 3.4 cm. Normal appearance/no adnexal mass.  Pulsed Doppler evaluation of both ovaries demonstrates normal low-resistance arterial and venous waveforms.  Other findings  No free fluid.  IMPRESSION: 1. No evidence of ovarian torsion or other acute abnormality at this time. 2. At least 2 uterine fibroids are identified as described above.   Electronically Signed   By: Jacqulynn Cadet M.D.   On: 03/28/2014 16:15     EKG Interpretation None      11:40 AM Patient seen and examined. Work-up initiated. Medications ordered. UPT from [facility] is neg. UA is contaminated, patient is on her cycle.   Vital signs reviewed and are as follows: Pulse 81  Temp(Src) 97.6 F (36.4 C) (Oral)  Resp 24  SpO2 100%  LMP 03/27/2014 (Exact Date)   1:11 PM Pelvic exam performed with nurse chaperone. Pain better controlled. Pelvic US ordered given extent of pain. Doubt torsion but will rule-out.   4:00 PM Hand off to [employer] at shift change. Pt updated on results. Will transition to PO's and if doing well in 30-60 mins can d/c to home.   Patient counseled on use of narcotic pain medications. Counseled not to combine these medications with others containing tylenol. Urged not to drink alcohol, drive, or perform any other activities that requires focus while taking these medications.  The patient verbalizes understanding and agrees with the plan.  The patient was urged to return to the Emergency Department immediately with worsening of current symptoms, worsening abdominal pain, persistent vomiting, blood noted in stools, fever, or any other concerns. The patient verbalized understanding.    MDM   Final diagnoses:  Pelvic pain in female  Uterine leiomyoma, unspecified location   Patient with pelvic pain, vaginal bleeding. US shows fibroids. No torsion. Labs reassuring. Suspect she is having pain from fibroids and menstrual period. Pain controlled in ED. She has GYN follow-up.  Patient urged to return with worsening symptoms or other concerns. Patient verbalized understanding and agrees with plan.      Carlisle Cater, PA-C 03/29/14 [place], MD 03/31/14 [phone]

## 2014-03-28 NOTE — ED Notes (Signed)
[facility] notified of need for transport to ED.  ED Charge RN, Janett Billow, given report.

## 2014-03-28 NOTE — ED Provider Notes (Signed)
Patient received in PA Geiple at shift change.  Briefly, 43 y.o. F with severe lower abdominal cramping.  Feels associated with menstrual period.  Lab work reassuring.  Pelvic ultrasound performed revealing uterine fibroids.  Pain currently controlled with IV meds.  Plan:  Ween off IV meds and transition to PO meds.  PA Rondel Oh has already printed discharge instructions, feels she may be discharged once tolerating PO and pain well controlled.  Results for orders placed or performed during the hospital encounter of 03/28/14  Wet prep, genital  Result Value Ref Range   Yeast Wet Prep HPF POC NONE SEEN NONE SEEN   Trich, Wet Prep NONE SEEN NONE SEEN   Clue Cells Wet Prep HPF POC FEW (A) NONE SEEN   WBC, Wet Prep HPF POC MANY (A) NONE SEEN  CBC with Differential  Result Value Ref Range   WBC 6.8 4.0 - 10.5 K/uL   RBC 4.36 3.87 - 5.11 MIL/uL   Hemoglobin 12.0 12.0 - 15.0 g/dL   HCT 36.3 36.0 - 46.0 %   MCV 83.3 78.0 - 100.0 fL   MCH 27.5 26.0 - 34.0 pg   MCHC 33.1 30.0 - 36.0 g/dL   RDW 14.0 11.5 - 15.5 %   Platelets 352 150 - 400 K/uL   Neutrophils Relative % 63 43 - 77 %   Neutro Abs 4.3 1.7 - 7.7 K/uL   Lymphocytes Relative 28 12 - 46 %   Lymphs Abs 1.9 0.7 - 4.0 K/uL   Monocytes Relative 8 3 - 12 %   Monocytes Absolute 0.5 0.1 - 1.0 K/uL   Eosinophils Relative 1 0 - 5 %   Eosinophils Absolute 0.1 0.0 - 0.7 K/uL   Basophils Relative 0 0 - 1 %   Basophils Absolute 0.0 0.0 - 0.1 K/uL  Comprehensive metabolic panel  Result Value Ref Range   Sodium 138 137 - 147 mEq/L   Potassium 4.1 3.7 - 5.3 mEq/L   Chloride 102 96 - 112 mEq/L   CO2 22 19 - 32 mEq/L   Glucose, Bld 82 70 - 99 mg/dL   BUN 9 6 - 23 mg/dL   Creatinine, Ser 0.74 0.50 - 1.10 mg/dL   Calcium 9.3 8.4 - 10.5 mg/dL   Total Protein 7.2 6.0 - 8.3 g/dL   Albumin 4.0 3.5 - 5.2 g/dL   AST 14 0 - 37 U/L   ALT 10 0 - 35 U/L   Alkaline Phosphatase 49 39 - 117 U/L   Total Bilirubin 0.3 0.3 - 1.2 mg/dL   GFR calc non Af Amer  >90 >90 mL/min   GFR calc Af Amer >90 >90 mL/min   Anion gap 14 5 - 15  Urinalysis, Routine w reflex microscopic  Result Value Ref Range   Color, Urine YELLOW YELLOW   APPearance CLEAR CLEAR   Specific Gravity, Urine 1.027 1.005 - 1.030   pH 5.0 5.0 - 8.0   Glucose, UA NEGATIVE NEGATIVE mg/dL   Hgb urine dipstick NEGATIVE NEGATIVE   Bilirubin Urine NEGATIVE NEGATIVE   Ketones, ur 15 (A) NEGATIVE mg/dL   Protein, ur NEGATIVE NEGATIVE mg/dL   Urobilinogen, UA 0.2 0.0 - 1.0 mg/dL   Nitrite NEGATIVE NEGATIVE   Leukocytes, UA NEGATIVE NEGATIVE   US Transvaginal Non-ob  03/28/2014   CLINICAL DATA:  43 year old female with severe bilateral pelvic pain, nausea and diarrhea  EXAM: TRANSABDOMINAL AND TRANSVAGINAL ULTRASOUND OF PELVIS  DOPPLER ULTRASOUND OF OVARIES  TECHNIQUE: Both transabdominal and transvaginal ultrasound  examinations of the pelvis were performed. Transabdominal technique was performed for global imaging of the pelvis including uterus, ovaries, adnexal regions, and pelvic cul-de-sac.  It was necessary to proceed with endovaginal exam following the transabdominal exam to visualize the endometrium. Color and duplex Doppler ultrasound was utilized to evaluate blood flow to the ovaries.  COMPARISON:  Prior pelvic ultrasound 09/24/2008  FINDINGS: Uterus  Measurements: 12.4 x 7.8 x 6.9 cm. At least 2 heterogeneous, hypoechoic solid masses are identified consistent with uterine fibroids. The larger lesion is located in the sub serosal posterior uterus to the right of midline and measures 5.6 x 4.6 x 5.3 cm. A smaller 1.8 x 1.9 x 1.5 cm fibroid is present in the intramural anterior fundus to the left of midline.  Endometrium  Thickness: 6 mm.  No focal abnormality visualized.  Right ovary  Measurements: 2.6 x 1.5 x 2.0 cm. Normal appearance/no adnexal mass.  Left ovary  Measurements: 2.6 x 1.3 x 3.4 cm. Normal appearance/no adnexal mass.  Pulsed Doppler evaluation of both ovaries demonstrates  normal low-resistance arterial and venous waveforms.  Other findings  No free fluid.  IMPRESSION: 1. No evidence of ovarian torsion or other acute abnormality at this time. 2. At least 2 uterine fibroids are identified as described above.   Electronically Signed   By: Jacqulynn Cadet M.D.   On: 03/28/2014 16:15   US Pelvis Complete  03/28/2014   CLINICAL DATA:  43 year old female with severe bilateral pelvic pain, nausea and diarrhea  EXAM: TRANSABDOMINAL AND TRANSVAGINAL ULTRASOUND OF PELVIS  DOPPLER ULTRASOUND OF OVARIES  TECHNIQUE: Both transabdominal and transvaginal ultrasound examinations of the pelvis were performed. Transabdominal technique was performed for global imaging of the pelvis including uterus, ovaries, adnexal regions, and pelvic cul-de-sac.  It was necessary to proceed with endovaginal exam following the transabdominal exam to visualize the endometrium. Color and duplex Doppler ultrasound was utilized to evaluate blood flow to the ovaries.  COMPARISON:  Prior pelvic ultrasound 09/24/2008  FINDINGS: Uterus  Measurements: 12.4 x 7.8 x 6.9 cm. At least 2 heterogeneous, hypoechoic solid masses are identified consistent with uterine fibroids. The larger lesion is located in the sub serosal posterior uterus to the right of midline and measures 5.6 x 4.6 x 5.3 cm. A smaller 1.8 x 1.9 x 1.5 cm fibroid is present in the intramural anterior fundus to the left of midline.  Endometrium  Thickness: 6 mm.  No focal abnormality visualized.  Right ovary  Measurements: 2.6 x 1.5 x 2.0 cm. Normal appearance/no adnexal mass.  Left ovary  Measurements: 2.6 x 1.3 x 3.4 cm. Normal appearance/no adnexal mass.  Pulsed Doppler evaluation of both ovaries demonstrates normal low-resistance arterial and venous waveforms.  Other findings  No free fluid.  IMPRESSION: 1. No evidence of ovarian torsion or other acute abnormality at this time. 2. At least 2 uterine fibroids are identified as described above.    Electronically Signed   By: Jacqulynn Cadet M.D.   On: 03/28/2014 16:15   [place] Art/ven Flow Abd Pelv Doppler  03/28/2014   CLINICAL DATA:  43 year old female with severe bilateral pelvic pain, nausea and diarrhea  EXAM: TRANSABDOMINAL AND TRANSVAGINAL ULTRASOUND OF PELVIS  DOPPLER ULTRASOUND OF OVARIES  TECHNIQUE: Both transabdominal and transvaginal ultrasound examinations of the pelvis were performed. Transabdominal technique was performed for global imaging of the pelvis including uterus, ovaries, adnexal regions, and pelvic cul-de-sac.  It was necessary to proceed with endovaginal exam following the transabdominal exam to visualize the endometrium.  Color and duplex Doppler ultrasound was utilized to evaluate blood flow to the ovaries.  COMPARISON:  Prior pelvic ultrasound 09/24/2008  FINDINGS: Uterus  Measurements: 12.4 x 7.8 x 6.9 cm. At least 2 heterogeneous, hypoechoic solid masses are identified consistent with uterine fibroids. The larger lesion is located in the sub serosal posterior uterus to the right of midline and measures 5.6 x 4.6 x 5.3 cm. A smaller 1.8 x 1.9 x 1.5 cm fibroid is present in the intramural anterior fundus to the left of midline.  Endometrium  Thickness: 6 mm.  No focal abnormality visualized.  Right ovary  Measurements: 2.6 x 1.5 x 2.0 cm. Normal appearance/no adnexal mass.  Left ovary  Measurements: 2.6 x 1.3 x 3.4 cm. Normal appearance/no adnexal mass.  Pulsed Doppler evaluation of both ovaries demonstrates normal low-resistance arterial and venous waveforms.  Other findings  No free fluid.  IMPRESSION: 1. No evidence of ovarian torsion or other acute abnormality at this time. 2. At least 2 uterine fibroids are identified as described above.   Electronically Signed   By: Jacqulynn Cadet M.D.   On: 03/28/2014 16:15    5:17 PM Patient feeling much better, ready to be discharged.  Tolerated PO without difficulty.  Will d/c with instructions and scripts per PA Geiple.  Larene Pickett, PA-C 03/28/14 [id number]  Hoy Morn, MD 03/31/14 [phone]

## 2014-03-28 NOTE — ED Notes (Signed)
[facility] arrival.  Report given.

## 2014-03-28 NOTE — ED Notes (Signed)
Pt daughter to bedside.  Pt cooperative

## 2014-03-28 NOTE — ED Notes (Signed)
Report from donna, rn.  Pt care assumed.

## 2014-03-28 NOTE — ED Provider Notes (Signed)
Chief Complaint   Abdominal Pain   History of Present Illness   Susan Sanders is a 43 year old female presents with a history since yesterday evening of severe lower abdominal pain. The pain began gradually and built up to a severe level. It's located in the lower abdomen bilaterally with radiation to the back and down the legs. The pain is continuous and rated a 20/10 in intensity. The patient has tried heat and ice without relief. Nothing makes it better or worse. She's had subjective fever and sweats. She is nauseated but has not vomited. She does not have an appetite. Yesterday she had a few loose stools but none today. No blood in the stool. She denies any urinary symptoms. She is on her menses right now. They started yesterday. She had a tubal ligation. The patient states she's had no sexual activity in the past month. She denies any vaginal discharge or itching.  Review of Systems   Other than as noted above, the patient denies any of the following symptoms: Constitutional:  No fever, chills, weight loss or anorexia. Abdomen:  No nausea, vomiting, hematememesis, melena, diarrhea, or hematochezia. GU:  No dysuria, frequency, urgency, or hematuria. Gyn:  No vaginal discharge, itching, abnormal bleeding, dyspareunia, or pelvic pain.  [place]   Past medical history, family history, social history, meds, and allergies were reviewed. She is allergic to tramadol which causes her to break out in a rash, although she can take other opioid pain medications. She's tried ibuprofen without relief.  Physical Exam     Vital signs:  BP 172/93 mmHg  Pulse 71  Temp(Src) 98.3 F (36.8 C) (Oral)  Resp 16  SpO2 100%  LMP 03/27/2014 (Exact Date) Gen:  Alert, oriented, moaning and writhing in distress. Lungs:  Breath sounds clear and equal bilaterally.  No wheezes, rales or rhonchi. Heart:  Regular rhythm.  No gallops or murmers.   Abdomen:  Soft, flat, and nondistended. No organomegaly or mass.  Bowel sounds are normally active. There is pain to palpation across her entire lower abdomen without guarding or rebound. Skin:  Clear, warm and dry.  No rash.  Labs   Results for orders placed or performed during the hospital encounter of 03/28/14  POCT urinalysis dip (device)  Result Value Ref Range   Glucose, UA NEGATIVE NEGATIVE mg/dL   Bilirubin Urine SMALL (A) NEGATIVE   Ketones, ur NEGATIVE NEGATIVE mg/dL   Specific Gravity, Urine >=1.030 1.005 - 1.030   Hgb urine dipstick LARGE (A) NEGATIVE   pH 5.0 5.0 - 8.0   Protein, ur 30 (A) NEGATIVE mg/dL   Urobilinogen, UA 0.2 0.0 - 1.0 mg/dL   Nitrite NEGATIVE NEGATIVE   Leukocytes, UA NEGATIVE NEGATIVE  Pregnancy, urine POC  Result Value Ref Range   Preg Test, Ur NEGATIVE NEGATIVE   Course in Urgent [place]   The following medications were given:  Medications  ondansetron (ZOFRAN-ODT) disintegrating tablet 8 mg   0.9 %  sodium chloride infusion   HYDROmorphone (DILAUDID) injection 2 mg    Assessment   The encounter diagnosis was Lower abdominal pain.  Broad differential diagnosis includes diverticulitis, appendicitis, PID, ovarian torsion, UTI, or viral gastroenteritis.  Plan     The patient was transferred to the ED via EMS in stable condition.  Medical Decision Making:  43 year old female with history since last night of severe, lower abdominal pain, subjective fever, sweats, nausea, and anorexia.  Had a few loose stools yesterday but none today.  No vomiting.  Is on menses now.  On exam she is writhing and moaning in pain.  She is tender to palpation over lower abdomen without guarding or rebound.  Bowel sounds normal.  Has a broad differential.  Will start IV NS, give Dilaudid and Zofran, and transfer by EMS.      Harden Mo, MD 03/28/14 [phone]

## 2014-03-28 NOTE — ED Notes (Signed)
Pt sent from Urgent care with complaint of abd pain.  Also has had diarrhea x 2 days

## 2014-03-28 NOTE — ED Notes (Signed)
Patient transported to Ultrasound 

## 2014-03-28 NOTE — ED Notes (Signed)
C/O low abd pain, cramping in legs, and low back pain constantly x 2 days.  + nausea, no vomiting.  Had diarrhea several times yesterday, but denies today.  Unsure if fevers.  Denies having had vaginal discharge prior to started menstrual cycle yesterday.  Has tried 800mg  IBU, heat, and ice without relief.  Pt very tearful.

## 2014-03-28 NOTE — ED Notes (Signed)
MD at bedside. 

## 2014-03-28 NOTE — ED Notes (Signed)
Multiple family members at bedside.  Pt resting quietly

## 2014-03-28 NOTE — ED Notes (Signed)
In and out cath completed for UA pt tolerated well

## 2014-03-28 NOTE — Discharge Instructions (Signed)
We have determined that your problem requires further evaluation in the emergency department.  We will take care of your transport there.  Once at the emergency department, you will be evaluated by a provider and they will order whatever treatment or tests they deem necessary.  We cannot guarantee that they will do any specific test or do any specific treatment.  ° °

## 2014-03-28 NOTE — Discharge Instructions (Signed)
Please read and follow all provided instructions.  Your diagnoses today include:  1. Uterine leiomyoma, unspecified location   2. Pelvic pain in female     Tests performed today include:  Blood counts and electrolytes - normal  Blood tests to check liver and kidney function - normal  Urine test to look for infection and pregnancy (in women) - normal  Ultrasound - shows that you have two fibroids in the uterus  Vital signs. See below for your results today.   Medications prescribed:   Percocet (oxycodone/acetaminophen) - narcotic pain medication  DO NOT drive or perform any activities that require you to be awake and alert because this medicine can make you drowsy. BE VERY CAREFUL not to take multiple medicines containing Tylenol (also called acetaminophen). Doing so can lead to an overdose which can damage your liver and cause liver failure and possibly death.   Ibuprofen (Motrin, Advil) - anti-inflammatory pain medication  Do not exceed 600mg  ibuprofen every 6 hours, take with food  You have been prescribed an anti-inflammatory medication or NSAID. Take with food. Take smallest effective dose for the shortest duration needed for your pain. Stop taking if you experience stomach pain or vomiting.   Take any prescribed medications only as directed.  Home care instructions:   Follow any educational materials contained in this packet.  Follow-up instructions: Please follow-up with your primary care provider in the next 3 days for further evaluation of your symptoms.    Return instructions:  SEEK IMMEDIATE MEDICAL ATTENTION IF:  The pain does not go away or becomes severe   A temperature above 101F develops   Repeated vomiting occurs (multiple episodes)   The pain becomes localized to portions of the abdomen. The right side could possibly be appendicitis. In an adult, the left lower portion of the abdomen could be colitis or diverticulitis.   Blood is being passed in  stools or vomit (bright red or black tarry stools)   You develop chest pain, difficulty breathing, dizziness or fainting, or become confused, poorly responsive, or inconsolable (young children)  If you have any other emergent concerns regarding your health  Additional Information: Abdominal (belly) pain can be caused by many things. Your caregiver performed an examination and possibly ordered blood/urine tests and imaging (CT scan, x-rays, ultrasound). Many cases can be observed and treated at home after initial evaluation in the emergency department. Even though you are being discharged home, abdominal pain can be unpredictable. Therefore, you need a repeated exam if your pain does not resolve, returns, or worsens. Most patients with abdominal pain don't have to be admitted to the hospital or have surgery, but serious problems like appendicitis and gallbladder attacks can start out as nonspecific pain. Many abdominal conditions cannot be diagnosed in one visit, so follow-up evaluations are very important.  Your vital signs today were: BP 124/57 mmHg   Pulse 66   Temp(Src) 97.6 F (36.4 C) (Oral)   Resp 10   SpO2 100%   LMP 03/27/2014 (Exact Date) If your blood pressure (bp) was elevated above 135/85 this visit, please have this repeated by your doctor within one month. --------------

## 2014-03-29 LAB — GC/CHLAMYDIA PROBE AMP
CT PROBE, AMP APTIMA: NEGATIVE
GC Probe RNA: NEGATIVE

## 2014-04-23 ENCOUNTER — Ambulatory Visit (INDEPENDENT_AMBULATORY_CARE_PROVIDER_SITE_OTHER): Payer: Medicaid Other | Admitting: Family Medicine

## 2014-04-23 ENCOUNTER — Encounter: Payer: Self-pay | Admitting: Family Medicine

## 2014-04-23 VITALS — BP 147/78 | HR 72 | Temp 98.4°F | Ht 65.0 in | Wt 169.5 lb

## 2014-04-23 DIAGNOSIS — M549 Dorsalgia, unspecified: Secondary | ICD-10-CM

## 2014-04-23 MED ORDER — OXYCODONE-ACETAMINOPHEN 5-325 MG PO TABS: 1.0000 | ORAL_TABLET | Freq: Four times a day (QID) | ORAL | Status: DC | PRN

## 2014-04-23 NOTE — Patient Instructions (Signed)
I think you have aggravated chronic back pain with your recent lifting. Be sure to not do heavy lifting. Work on physical therapy exercises printed out. Take ibuprofen as needed. Take percocet only for severe pain. Use heat and ice. Get xrays. I will call with anything NOT normal. Follow up in 2 weeks with your PCP.  Best,  Hilton Sinclair, MD

## 2014-04-23 NOTE — Progress Notes (Signed)
Patient ID: Susan Sanders, female   DOB: 03-30-1971, 43 y.o.   MRN: [id number]  Subjective:   CC: Back pain  HPI:   Patient presents to sameday clinic to discuss back pain.  Helped daughter move 2 weeks ago. Back hurting since then from her neck all the way down. Taking 800mg  Ibuprofen and flexeril which are not helping at all. Feels like 8/10 pain and pressure in her back, constant. Legs are also painful.  Able to ambulate, denies incontinence. Unsure if red or swollen Unable to pinpoint any worse area, reports pain in "whole back" Unable to clarify pain type besides pressure like someone pushing She has been in the house all week. Ice and heat help a little. She has done exercises and walking in the past with back pain, along with stretching, none of which help.   Review of Systems - Per HPI.   PMH - hand pain, migraine, GERD, former smoker, fatigue, dyshidrotic eczema, DUB, back pain, anxiety/depression, allergic rhinitis    Objective:  Physical Exam BP 147/78 mmHg  Pulse 72  Temp(Src) 98.4 F (36.9 C) (Oral)  Ht 5\' 5"  (1.651 m)  Wt 169 lb 8 oz (76.885 kg)  BMI 28.21 kg/m2  LMP 03/27/2014 (Exact Date) GEN: NAD BACK: Back mildly rotated at baseline with right ribcage more prominent on forward flexion; tender paraspinal muscles throughout back, worse in lumbar spine; no erythema or swelling EXTR: 5/5 hip flexion bilaterally, negative straight leg raise but with tight hamstrings bilaterally NEURO: Awake, alert, no focal deficits, normal gait though mildly slowed/stiffened    Assessment:     Susan Sanders is a 43 y.o. female here for low back pain.    Plan:     # See problem list and after visit summary for problem-specific plans.   # Health Maintenance: Not discussed  Follow-up: Follow up in 2 weeks with PCP.   Hilton Sinclair, MD [place]

## 2014-04-23 NOTE — Assessment & Plan Note (Signed)
Acutely worsened back pain possibly due to lifting to help dtr move. Pt unable to clarify if any area is worse, though does seem to note feeling some spasm-type symptoms. Chronic component, seen most recently 12/2012. Lumbar xray July showed some scoliosis and degenerative changes. Patient did not get thoracic and c-spine xrays that were recommended by PCP for unclear reasons. No red flags. - xray thoracic and c-spine. - stretch/strengthening discussed and provided handouts re: back pain. Medicaid will not cover PT referral so not placed. - Advil for moderate pain, percocet #15 prescribed for severe pain. - Discussed importance of f/u with PCP in 2 weeks if still present.

## 2014-04-23 NOTE — Progress Notes (Signed)
I agree with the resident documentation and plan.   Braylen Staller MD  

## 2014-05-24 ENCOUNTER — Telehealth: Payer: Self-pay | Admitting: Family Medicine

## 2014-05-24 NOTE — Telephone Encounter (Signed)
Requesting copy of tb results

## 2014-05-24 NOTE — Telephone Encounter (Signed)
Spoke with patient and printed out her last TB result note from 11/2013 and sat up front for pick up

## 2014-05-25 ENCOUNTER — Ambulatory Visit (INDEPENDENT_AMBULATORY_CARE_PROVIDER_SITE_OTHER): Payer: Medicaid Other | Admitting: Family Medicine

## 2014-05-25 ENCOUNTER — Encounter: Payer: Self-pay | Admitting: Family Medicine

## 2014-05-25 ENCOUNTER — Ambulatory Visit (HOSPITAL_COMMUNITY)
Admission: RE | Admit: 2014-05-25 | Discharge: 2014-05-25 | Disposition: A | Discharge status: Home / Self Care | Payer: Medicaid Other | Source: Ambulatory Visit | Attending: Family Medicine | Admitting: Family Medicine

## 2014-05-25 DIAGNOSIS — M542 Cervicalgia: Secondary | ICD-10-CM | POA: Diagnosis not present

## 2014-05-25 DIAGNOSIS — M545 Low back pain, unspecified: Secondary | ICD-10-CM

## 2014-05-25 DIAGNOSIS — M549 Dorsalgia, unspecified: Secondary | ICD-10-CM | POA: Diagnosis not present

## 2014-05-25 DIAGNOSIS — M79601 Pain in right arm: Secondary | ICD-10-CM | POA: Diagnosis not present

## 2014-05-25 DIAGNOSIS — M47892 Other spondylosis, cervical region: Secondary | ICD-10-CM | POA: Diagnosis not present

## 2014-05-25 MED ORDER — CYCLOBENZAPRINE HCL 5 MG PO TABS: 5.0000 mg | ORAL_TABLET | Freq: Three times a day (TID) | ORAL | Status: DC | PRN

## 2014-05-25 MED ORDER — GABAPENTIN 100 MG PO CAPS: 100.0000 mg | ORAL_CAPSULE | Freq: Three times a day (TID) | ORAL | Status: DC

## 2014-05-25 MED ORDER — OXYCODONE-ACETAMINOPHEN 5-325 MG PO TABS: 1.0000 | ORAL_TABLET | Freq: Four times a day (QID) | ORAL | Status: DC | PRN

## 2014-05-25 NOTE — Patient Instructions (Signed)
For your back pain, I want to get an MRI to find out exactly what is going on. I will call you when I get those results.  Please keep taking the alleve and I am also giving you more flexeril and percocet to use as needed. I want to start a new medicine called gabapentin that I think should help even more. It makes people sleepy so you will need to start it at a low dose and go up gradually.

## 2014-05-25 NOTE — Progress Notes (Signed)
   Subjective:    Patient ID: Susan Sanders, female    DOB: [birth year], 44 y.o.   MRN: [id number]  HPI Pt presents for f/u of back pain. Patient has had severe low back pain for the last 2 months. She had a fall and the pain started shortly afterward. The pain is worst in her right lumbar area but extends throughout back and to her head sometimes. She reports that the percocet and flexeril help some but it always comes right back. She has been doing exercises given at last clinic visit and walking, which helps a little   Review of Systems No fever, see HPI    Objective:   Physical Exam  Constitutional: She is oriented to person, place, and time. She appears well-developed and well-nourished. She appears distressed.  HENT:  Head: Normocephalic and atraumatic.  Eyes: Conjunctivae are normal. Right eye exhibits no discharge. Left eye exhibits no discharge. No scleral icterus.  Cardiovascular: Normal rate.   Pulmonary/Chest: Effort normal.  Abdominal: She exhibits no distension.  Musculoskeletal:       Lumbar back: She exhibits tenderness, bony tenderness, pain and spasm. She exhibits no swelling, no edema, no deformity, no laceration and normal pulse.  ROM limited by pain, +straight leg raise (pain in right back with lifting left leg)  Neurological: She is alert and oriented to person, place, and time.  Skin: Skin is warm and dry. No rash noted. She is not diaphoretic.  Psychiatric:  Tearful when discussing her pain and fear that something is seriously wrong  Nursing note and vitals reviewed.         Assessment & Plan:

## 2014-05-25 NOTE — Assessment & Plan Note (Signed)
Acute onset right lumbar back pain, severe, not responding to conservative measures, +straight leg raise, midline tenderness - concern for disc herniation vs. paraspinal muscle spasm  - MRI L spine - continue aleve bid and add gabapentin to titrate up - flexeril and percocet prn - continue gentle stretches/exercises, heating pads, muscle rubs - will call with MRI results and f/u in 2 weeks

## 2014-05-27 ENCOUNTER — Telehealth: Payer: Self-pay | Admitting: Family Medicine

## 2014-05-27 ENCOUNTER — Ambulatory Visit
Admission: RE | Admit: 2014-05-27 | Discharge: 2014-05-27 | Disposition: A | Discharge status: Home / Self Care | Payer: Medicaid Other | Source: Ambulatory Visit | Attending: Family Medicine | Admitting: Family Medicine

## 2014-05-27 DIAGNOSIS — M545 Low back pain, unspecified: Secondary | ICD-10-CM

## 2014-05-27 NOTE — Telephone Encounter (Signed)
Tried to call patient but was unable to reach.  VM full.  Will try calling again tomorrow. Jazmin Hartsell,CMA

## 2014-05-27 NOTE — Telephone Encounter (Signed)
Please let Susan Sanders know that her lumbar and thoracic spine xrays came back showing slight rightward scoliosis (rotation) in the thoracic spine and cervical spondylosis (common degenerative/arthritic changes) most notable C5-C6. I am aware that she was seen 2 days ago by Dr Sherril Cong who ordered a lumbar MRI, which we will follow up once Susan Sanders gets this done. Will forward to PCP.  Hilton Sinclair, MD

## 2014-06-03 NOTE — Telephone Encounter (Signed)
Unable to lm for patient due to mailbox being full. Will mail letter.

## 2014-06-07 ENCOUNTER — Telehealth: Payer: Self-pay | Admitting: *Deleted

## 2014-06-07 NOTE — Telephone Encounter (Signed)
-----   Message from Frazier Richards, MD sent at 05/31/2014 10:47 AM EST ----- Please let Ms. Geng know that her MRI did show some degeneration of the discs as we discussed and a bulge in the area where most of her pain is. These types of injuries usually resolve on their own but sometimes require steroid injections or even surgery. She should follow up with Dr. Raoul Pitch as scheduled (February 5th) to determine whether medication adjustments are needed or whether she may need to be referred somewhere they can do the injections.  Thanks

## 2014-06-07 NOTE — Telephone Encounter (Signed)
Tried calling mailbox full, will try again later

## 2014-06-18 ENCOUNTER — Ambulatory Visit (INDEPENDENT_AMBULATORY_CARE_PROVIDER_SITE_OTHER): Payer: Medicaid Other | Admitting: Family Medicine

## 2014-06-18 ENCOUNTER — Encounter: Payer: Self-pay | Admitting: Family Medicine

## 2014-06-18 VITALS — BP 136/71 | HR 80 | Temp 98.4°F | Ht 65.0 in | Wt 173.9 lb

## 2014-06-18 DIAGNOSIS — M5441 Lumbago with sciatica, right side: Secondary | ICD-10-CM

## 2014-06-18 DIAGNOSIS — M545 Low back pain, unspecified: Secondary | ICD-10-CM

## 2014-06-18 DIAGNOSIS — M4302 Spondylolysis, cervical region: Secondary | ICD-10-CM | POA: Insufficient documentation

## 2014-06-18 MED ORDER — PREDNISONE 50 MG PO TABS: 50.0000 mg | ORAL_TABLET | Freq: Every day | ORAL | Status: DC

## 2014-06-18 MED ORDER — PREDNISONE 50 MG PO TABS: ORAL_TABLET | ORAL | Status: DC

## 2014-06-18 MED ORDER — CYCLOBENZAPRINE HCL 5 MG PO TABS: 5.0000 mg | ORAL_TABLET | Freq: Three times a day (TID) | ORAL | Status: DC | PRN

## 2014-06-18 MED ORDER — GABAPENTIN 400 MG PO CAPS: 400.0000 mg | ORAL_CAPSULE | Freq: Three times a day (TID) | ORAL | Status: DC

## 2014-06-18 MED ORDER — OXYCODONE-ACETAMINOPHEN 5-325 MG PO TABS: 1.0000 | ORAL_TABLET | Freq: Four times a day (QID) | ORAL | Status: DC | PRN

## 2014-06-18 NOTE — Assessment & Plan Note (Addendum)
Will increase Gabapentin today and give steroid burst. Refill oxycodone. Discussed with patient the addictive properties of oxycodone, and to only use for extreme pain. If she is in need of additional prescription for oxycodone, will need to have her sign a pain contract and get a urine drug screen. 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 A small extra foraminal protrusion on the left at L3-4 contacts the left L3 root without compressing it. Scattered mild facet arthropathy. Will see back in 2 weeks. If not improved significantly will send for possible epidural injections.

## 2014-06-18 NOTE — Assessment & Plan Note (Signed)
Will increase Gabapentin today and give steroid burst. Refill oxycodone. Discussed with patient the addictive properties of oxycodone, and to only use for extreme pain. If she is in need of additional prescription for oxycodone, will need to have her sign a pain contract and get a urine drug screen. X-rays with disc space narrowing and cervical spondylolysis and C5-6, there also seems to be some foraminal narrowing C3-6. Follow-up in 2 weeks

## 2014-06-18 NOTE — Patient Instructions (Signed)
Back Injury Prevention The following tips can help you to prevent a back injury. PHYSICAL FITNESS  Exercise often. Try to develop strong stomach (abdominal) muscles.  Do aerobic exercises often. This includes walking, jogging, biking, swimming.  Do exercises that help with balance and strength often. This includes tai chi and yoga.  Stretch before and after you exercise.  Keep a healthy weight. DIET   Ask your doctor how much calcium and vitamin D you need every day.  Include calcium in your diet. Foods high in calcium include dairy products; green, leafy vegetables; and products with calcium added (fortified).  Include vitamin D in your diet. Foods high in vitamin D include milk and products with vitamin D added.  Think about taking a multivitamin or other nutritional products called " supplements."  Stop smoking if you smoke. POSTURE   Sit and stand up straight. Avoid leaning forward or hunching over.  Choose chairs that support your lower back.  If you work at a desk:  Sit close to your work so you do not lean over.  Keep your chin tucked in.  Keep your neck drawn back.  Keep your elbows bent at a right angle. Your arms should look like the letter "L."  Sit high and close to the steering wheel when you drive. Add low back support to your car seat if needed.  Avoid sitting or standing in one position for too long. Get up and move around every hour. Take breaks if you are driving for a long time.  Sleep on your side with your knees slightly bent. You can also sleep on your back with a pillow under your knees. Do not sleep on your stomach. LIFTING, TWISTING, AND REACHING  Avoid heavy lifting, especially lifting over and over again. If you must do heavy lifting:  Stretch before lifting.  Work slowly.  Rest between lifts.  Use carts and dollies to move objects when possible.  Make several small trips instead of carrying 1 heavy load.  Ask for help when you  need it.  Ask for help when moving big, awkward objects.  Follow these steps when lifting:  Stand with your feet shoulder-width apart.  Get as close to the object as you can. Do not pick up heavy objects that are far from your body.  Use handles or lifting straps when possible.  Bend at your knees. Squat down, but keep your heels off the floor.  Keep your shoulders back, your chin tucked in, and your back straight.  Lift the object slowly. Tighten the muscles in your legs, stomach, and butt. Keep the object as close to the center of your body as possible.  Reverse these directions when you put a load down.  Do not:  Lift the object above your waist.  Twist at the waist while lifting or carrying a load. Move your feet if you need to turn, not your waist.  Bend over without bending at your knees.  Avoid reaching over your head, across a table, or for an object on a high surface. OTHER TIPS  Avoid wet floors and keep sidewalks clear of ice.  Do not sleep on a mattress that is too soft or too hard.  Keep items that you use often within easy reach.  Put heavier objects on shelves at waist level. Put lighter objects on lower or higher shelves.  Find ways to lessen your stress. You can try exercise, massage, or relaxation.  Get help for depression or anxiety if  needed. GET HELP IF:  You injure your back.  You have questions about diet, exercise, or other ways to prevent back injuries. MAKE SURE YOU:  Understand these instructions.  Will watch your condition.  Will get help right away if you are not doing well or get worse. Document Released: 10/17/2007 Document Revised: 07/23/2011 Document Reviewed: 06/11/2011 [facility] Patient Information 2015 [place], [place]. This information is not intended to replace advice given to you by your health care provider. Make sure you discuss any questions you have with your health care provider.  I will need to see you back in 2-3  weeks. I think you will feel better with the steroid and increased dose of gabapentin, I hope you feel better

## 2014-06-18 NOTE — Progress Notes (Signed)
   Subjective:    Patient ID: Susan Sanders, female    DOB: [birth year], 44 y.o.   MRN: [id number]  HPI Back pain/neck/shoulder pain: Patient presents to family medicine clinic for follow-up on back pain. She was seen approximately 3 weeks ago for recurrent back pain, and prior to that for initial evaluation about 2 months ago. She has never hurt or injured her back prior. The original injury occurred after Lifting some boxes to help her daughter move, but states they were not even heavy when her back started hurting the next day. She has tried Flexeril, oxycodone, gabapentin, heat and ice with some benefit. MRI January 2016, significant for mild degenerative disease lumbar spine without central canal stenosis. A small extra foraminal protrusion on the left at L3-4 contacts the left L3 root without compressing it.  Scattered mild facet arthropathy.  Convex left scoliosis.  Cervical spine x-rays January 2016 Loss of disc space height and endplate spurring appear worst at C5-6. There also  appears to be left foraminal narrowing at C4-5 and C5-6. Patient states that she had mild improvement with some of the medications, but she has been out of the oxycodone and Flexeril for about a week. She states she has been miserably and pain, and doesn't know what else to do to make it better. She is rating her pain a 9 out of 10 back pain medications in the lower portion of her lumbar spine, with radiation down her right leg to her ankle. She states bending forward seems to make it worse, being still makes it better. I have reviewed all of her imaging studies with her today.  Former smoker Past Medical History  Diagnosis Date  . Scoliosis   . Complication of anesthesia     difficult waking up "  . Chest pain   . Depression   . Chronic back pain greater than 3 months duration   . Eczema   . Cellulitis    Allergies  Allergen Reactions  . Amoxicillin Itching and Swelling  . Latex Other (See Comments)    Itching  and burns skin  . Naproxen Hives and Itching    Takes IBU without difficulty  . Tramadol Hives and Itching    Takes IBU without difficulty   Review of Systems Per hPI    Objective:   Physical Exam BP 136/71 mmHg  Pulse 80  Temp(Src) 98.4 F (36.9 C) (Oral)  Ht 5\' 5"  (1.651 m)  Wt 173 lb 14.4 oz (78.881 kg)  BMI 28.94 kg/m2  LMP 05/25/2014 Gen: Patient appears in pain, but not distress. Nontoxic. Well-developed, well-nourished, alert and oriented. MSK/Neuro: Guarded shuffle gait. PERLA. EOMi. Alert. Cranial nerves II through XII intact. DTRs intact and equal bilaterally at knee and ankle. No bony tenderness of the cervical, thoracic or lumbar spine. Pain in the lumbar spine, with radiation to her buttocks and leg with flexion of her back. Decreased flexion. Normal extension. Guarded and mildly reduced side bending to the right. Rotation is normal. Neurovascularly intact distally.     Assessment & Plan:

## 2014-06-19 ENCOUNTER — Emergency Department (HOSPITAL_COMMUNITY)
Admission: EM | Admit: 2014-06-19 | Discharge: 2014-06-19 | Disposition: A | Discharge status: Home / Self Care | Payer: Medicaid Other | Attending: Emergency Medicine | Admitting: Emergency Medicine

## 2014-06-19 ENCOUNTER — Emergency Department (HOSPITAL_COMMUNITY): Payer: Medicaid Other

## 2014-06-19 ENCOUNTER — Encounter (HOSPITAL_COMMUNITY): Payer: Self-pay | Admitting: Emergency Medicine

## 2014-06-19 DIAGNOSIS — Z880 Allergy status to penicillin: Secondary | ICD-10-CM | POA: Diagnosis not present

## 2014-06-19 DIAGNOSIS — Z872 Personal history of diseases of the skin and subcutaneous tissue: Secondary | ICD-10-CM | POA: Diagnosis not present

## 2014-06-19 DIAGNOSIS — Z79899 Other long term (current) drug therapy: Secondary | ICD-10-CM | POA: Insufficient documentation

## 2014-06-19 DIAGNOSIS — S8001XA Contusion of right knee, initial encounter: Secondary | ICD-10-CM

## 2014-06-19 DIAGNOSIS — Z91040 Latex allergy status: Secondary | ICD-10-CM | POA: Insufficient documentation

## 2014-06-19 DIAGNOSIS — Z87891 Personal history of nicotine dependence: Secondary | ICD-10-CM | POA: Diagnosis not present

## 2014-06-19 DIAGNOSIS — S80212A Abrasion, left knee, initial encounter: Secondary | ICD-10-CM | POA: Diagnosis not present

## 2014-06-19 DIAGNOSIS — F329 Major depressive disorder, single episode, unspecified: Secondary | ICD-10-CM | POA: Diagnosis not present

## 2014-06-19 DIAGNOSIS — M419 Scoliosis, unspecified: Secondary | ICD-10-CM | POA: Diagnosis not present

## 2014-06-19 DIAGNOSIS — G8929 Other chronic pain: Secondary | ICD-10-CM | POA: Diagnosis not present

## 2014-06-19 DIAGNOSIS — W1839XA Other fall on same level, initial encounter: Secondary | ICD-10-CM | POA: Diagnosis not present

## 2014-06-19 DIAGNOSIS — Y9389 Activity, other specified: Secondary | ICD-10-CM | POA: Insufficient documentation

## 2014-06-19 DIAGNOSIS — S8991XA Unspecified injury of right lower leg, initial encounter: Secondary | ICD-10-CM | POA: Diagnosis present

## 2014-06-19 DIAGNOSIS — T07XXXA Unspecified multiple injuries, initial encounter: Secondary | ICD-10-CM

## 2014-06-19 DIAGNOSIS — S99912A Unspecified injury of left ankle, initial encounter: Secondary | ICD-10-CM | POA: Diagnosis not present

## 2014-06-19 DIAGNOSIS — S80211A Abrasion, right knee, initial encounter: Secondary | ICD-10-CM | POA: Insufficient documentation

## 2014-06-19 DIAGNOSIS — Y998 Other external cause status: Secondary | ICD-10-CM | POA: Insufficient documentation

## 2014-06-19 DIAGNOSIS — Y92008 Other place in unspecified non-institutional (private) residence as the place of occurrence of the external cause: Secondary | ICD-10-CM | POA: Insufficient documentation

## 2014-06-19 DIAGNOSIS — M25572 Pain in left ankle and joints of left foot: Secondary | ICD-10-CM

## 2014-06-19 MED ORDER — OXYCODONE-ACETAMINOPHEN 5-325 MG PO TABS
2.0000 | ORAL_TABLET | Freq: Once | ORAL | Status: AC
  Administered 2014-06-19: 2 via ORAL
  Filled 2014-06-19: qty 2

## 2014-06-19 NOTE — ED Notes (Signed)
Pt arrived to the ED with a complaint of bilateral knee pain and left leg pain.  Pt states she caught her foot on the curb while she was trying to get groceries into a house and fell.  Pt states that her left knee and right knee hurt and that her left leg hurts from the calf down

## 2014-06-19 NOTE — ED Provider Notes (Signed)
CSN: [id number]     Arrival date & time 06/19/14  0111 History   First MD Initiated Contact with Patient 06/19/14 0231     Chief Complaint  Patient presents with  . Fall   cation/radiation/quality/duration/timing/severity/associated sxs/prior Treatment) HPI Comments: Patient is a 44 year old female who presents to the emergency department for further evaluation of injuries following a fall. Patient states that her foot missed the curb causing her to fall forward on her bilateral knees. She did try to break some of the fall with her palms bilaterally. Patient states that she has been having pain in her bilateral knees since this time. She states that her right knee pain is worse than her left and she has worsening pain with ambulation. No medications taken prior to arrival. Patient states that she had pain medication prescribed by her primary doctor yesterday, but was unable to fill this prescription. She denies head trauma, loss of consciousness, bowel or bladder incontinence, extremity numbness/paresthesias, and extremity weakness.  Patient is a 43 y.o. female presenting with fall. The history is provided by the patient. No language interpreter was used.  Fall This is a new problem. The current episode started today. The problem has been unchanged. Associated symptoms include arthralgias, joint swelling and myalgias. Pertinent negatives include no nausea, numbness, vomiting or weakness. The symptoms are aggravated by walking. She has tried nothing for the symptoms. The treatment provided no relief.    Past Medical History  Diagnosis Date  . Scoliosis   . Complication of anesthesia     difficult waking up "  . Chest pain   . Depression   . Chronic back pain greater than 3 months duration   . Eczema   . Cellulitis    Past Surgical History  Procedure Laterality Date  . Tubal ligation     History reviewed. No pertinent family history. History  Substance Use Topics  . Smoking status:  Former Smoker -- 14 years    Quit date: 09/20/2010  . Smokeless tobacco: Never Used  . Alcohol Use: No   OB History    No data available      Review of Systems  Gastrointestinal: Negative for nausea and vomiting.  Genitourinary:       Negative for incontinence  Musculoskeletal: Positive for myalgias, joint swelling and arthralgias.  Skin: Positive for wound.  Neurological: Negative for syncope, weakness and numbness.  All other systems reviewed and are negative.   Allergies  Amoxicillin; Latex; Naproxen; and Tramadol  Home Medications   Prior to Admission medications   Medication Sig Start Date End Date Taking? Authorizing Provider  cyclobenzaprine (FLEXERIL) 5 MG tablet Take 1-2 tablets (5-10 mg total) by mouth 3 (three) times daily as needed for muscle spasms. 06/18/14   Renee A Kuneff, DO  gabapentin (NEURONTIN) 400 MG capsule Take 1 capsule (400 mg total) by mouth 3 (three) times daily. Start with 1 capsule at night, then increase by 1 cap daily until you reach 3 caps 3 times a day 06/18/14   Renee A Kuneff, DO  ibuprofen (ADVIL,MOTRIN) 800 MG tablet Take 1 tablet (800 mg total) by mouth 3 (three) times daily. 03/28/14   Carlisle Cater, PA-C  omeprazole (PRILOSEC) 20 MG capsule Take 1 capsule (20 mg total) by mouth daily. Patient not taking: Reported on 03/28/2014 12/10/13   Renee A Kuneff, DO  oxyCODONE-acetaminophen (PERCOCET/ROXICET) 5-325 MG per tablet Take 1 tablet by mouth every 6 (six) hours as needed for severe pain. 06/18/14   Renee  A Kuneff, DO  predniSONE (DELTASONE) 50 MG tablet Take one tab daily for 5 days 06/18/14   Renee A Kuneff, DO   BP 135/75 mmHg  Pulse 90  Temp(Src) 98.7 F (37.1 C) (Oral)  Resp 20  SpO2 99%  LMP 05/25/2014   Physical Exam  Constitutional: She is oriented to person, place, and time. She appears well-developed and well-nourished. No distress.  Nontoxic/nonseptic appearing  HENT:  Head: Normocephalic and atraumatic.  Eyes: Conjunctivae and  EOM are normal. No scleral icterus.  Neck: Normal range of motion.  Cardiovascular: Normal rate, regular rhythm and intact distal pulses.   DP and PT pulses 2+ b/l  Pulmonary/Chest: Effort normal. No respiratory distress.  Respirations even and unlabored  Musculoskeletal: Normal range of motion. She exhibits tenderness.       Right knee: She exhibits swelling (Mild) and bony tenderness. She exhibits normal range of motion, no effusion, no deformity, normal alignment, no LCL laxity and no MCL laxity. Tenderness found. Lateral joint line tenderness noted.       Left knee: She exhibits normal range of motion, no swelling, no effusion, no deformity, normal alignment, no LCL laxity and no MCL laxity. Tenderness found.       Left ankle: She exhibits normal range of motion, no swelling, no deformity and normal pulse. Tenderness. Achilles tendon normal.       Legs: Neurological: She is alert and oriented to person, place, and time. She exhibits normal muscle tone. Coordination normal.  Sensation to light touch in b/l lower extremities. DTRs normal and symmetric.  Skin: Skin is warm and dry. No rash noted. She is not diaphoretic. No erythema. No pallor.  Psychiatric: She has a normal mood and affect. Her behavior is normal.  Nursing note and vitals reviewed.   ED Course  Procedures (including critical care time) Labs Review Labs Reviewed - No data to display  Imaging Review Dg Tibia/fibula Left  06/19/2014   CLINICAL DATA:  [place] today  EXAM: RIGHT KNEE - COMPLETE 4+ VIEW; LEFT TIBIA AND FIBULA - 2 VIEW; LEFT FOOT - COMPLETE 3+ VIEW; LEFT ANKLE COMPLETE - 3+ VIEW  COMPARISON:  None.  FINDINGS: Right knee:  The joint spaces are maintained. No acute fracture. No joint effusion.  Left ankle:  The ankle mortise is maintained. No acute ankle fracture or osteochondral lesion. No joint effusion. The visualized mid and hindfoot bony structures are intact.  Left foot:  The joint spaces are maintained.  No acute  fracture is identified.  Left tibia/ fibula:  The knee and ankle joints are maintained. No acute fracture of the tibia or fibula.  IMPRESSION: No acute bony findings.   Electronically Signed   By: Kalman Jewels M.D.   On: 06/19/2014 02:20   Dg Ankle Complete Left  06/19/2014   CLINICAL DATA:  [place] today  EXAM: RIGHT KNEE - COMPLETE 4+ VIEW; LEFT TIBIA AND FIBULA - 2 VIEW; LEFT FOOT - COMPLETE 3+ VIEW; LEFT ANKLE COMPLETE - 3+ VIEW  COMPARISON:  None.  FINDINGS: Right knee:  The joint spaces are maintained. No acute fracture. No joint effusion.  Left ankle:  The ankle mortise is maintained. No acute ankle fracture or osteochondral lesion. No joint effusion. The visualized mid and hindfoot bony structures are intact.  Left foot:  The joint spaces are maintained.  No acute fracture is identified.  Left tibia/ fibula:  The knee and ankle joints are maintained. No acute fracture of the tibia or fibula.  IMPRESSION:  No acute bony findings.   Electronically Signed   By: Kalman Jewels M.D.   On: 06/19/2014 02:20   Dg Knee Complete 4 Views Left  06/19/2014   CLINICAL DATA:  Initial evaluation for acute trauma. Tripped over car. Now with acute leg pain.  EXAM: LEFT KNEE - COMPLETE 4+ VIEW  COMPARISON:  None.  FINDINGS: There is no evidence of fracture, dislocation, or joint effusion. There is no evidence of arthropathy or other focal bone abnormality. Soft tissues are unremarkable.  IMPRESSION: Negative.   Electronically Signed   By: Jeannine Boga M.D.   On: 06/19/2014 02:25   Dg Knee Complete 4 Views Right  06/19/2014   CLINICAL DATA:  [place] today  EXAM: RIGHT KNEE - COMPLETE 4+ VIEW; LEFT TIBIA AND FIBULA - 2 VIEW; LEFT FOOT - COMPLETE 3+ VIEW; LEFT ANKLE COMPLETE - 3+ VIEW  COMPARISON:  None.  FINDINGS: Right knee:  The joint spaces are maintained. No acute fracture. No joint effusion.  Left ankle:  The ankle mortise is maintained. No acute ankle fracture or osteochondral lesion. No joint effusion. The  visualized mid and hindfoot bony structures are intact.  Left foot:  The joint spaces are maintained.  No acute fracture is identified.  Left tibia/ fibula:  The knee and ankle joints are maintained. No acute fracture of the tibia or fibula.  IMPRESSION: No acute bony findings.   Electronically Signed   By: Kalman Jewels M.D.   On: 06/19/2014 02:20   Dg Foot Complete Left  06/19/2014   CLINICAL DATA:  [place] today  EXAM: RIGHT KNEE - COMPLETE 4+ VIEW; LEFT TIBIA AND FIBULA - 2 VIEW; LEFT FOOT - COMPLETE 3+ VIEW; LEFT ANKLE COMPLETE - 3+ VIEW  COMPARISON:  None.  FINDINGS: Right knee:  The joint spaces are maintained. No acute fracture. No joint effusion.  Left ankle:  The ankle mortise is maintained. No acute ankle fracture or osteochondral lesion. No joint effusion. The visualized mid and hindfoot bony structures are intact.  Left foot:  The joint spaces are maintained.  No acute fracture is identified.  Left tibia/ fibula:  The knee and ankle joints are maintained. No acute fracture of the tibia or fibula.  IMPRESSION: No acute bony findings.   Electronically Signed   By: Kalman Jewels M.D.   On: 06/19/2014 02:20     EKG Interpretation None      MDM   Final diagnoses:  Knee contusion, right, initial encounter  Ankle pain, left  Abrasions of multiple sites    44 year old female comes to the emergency department for further evaluation of injuries following a mechanical fall. Patient is neurovascularly intact. No evidence of head injury or trauma. No red flags or signs concerning for cauda equina. Physical exam notable for some mild focal tenderness to the lateral aspect of the right knee with bilateral knee abrasions. There is also mild generalized TTP about the L knee and ankle. Imaging reviewed which is negative. No indication for further emergent workup. Patient to be discharged with instruction for RICE. She has been told to fill the pain prescription provided by her primary care doctor.  Return precautions given. Patient agreeable to plan with no unaddressed concerns.   Filed Vitals:   06/19/14 0113 06/19/14 0117  BP:  135/75  Pulse:  90  Temp:  98.7 F (37.1 C)  TempSrc:  Oral  Resp:  20  SpO2: 100% 99%     Antonietta Breach, PA-C 06/19/14 4132  Jenny Reichmann  L [place], MD 06/19/14 [phone]

## 2014-06-19 NOTE — Discharge Instructions (Signed)
Contusion A contusion is a deep bruise. Contusions are the result of an injury that caused bleeding under the skin. The contusion may turn blue, purple, or yellow. Minor injuries will give you a painless contusion, but more severe contusions may stay painful and swollen for a few weeks.  CAUSES  A contusion is usually caused by a blow, trauma, or direct force to an area of the body. SYMPTOMS   Swelling and redness of the injured area.  Bruising of the injured area.  Tenderness and soreness of the injured area.  Pain. DIAGNOSIS  The diagnosis can be made by taking a history and physical exam. An X-ray, CT scan, or MRI may be needed to determine if there were any associated injuries, such as fractures. TREATMENT  Specific treatment will depend on what area of the body was injured. In general, the best treatment for a contusion is resting, icing, elevating, and applying cold compresses to the injured area. Over-the-counter medicines may also be recommended for pain control. Ask your caregiver what the best treatment is for your contusion. HOME CARE INSTRUCTIONS   Put ice on the injured area.  Put ice in a plastic bag.  Place a towel between your skin and the bag.  Leave the ice on for 15-20 minutes, 3-4 times a day, or as directed by your health care provider.  Only take over-the-counter or prescription medicines for pain, discomfort, or fever as directed by your caregiver. Your caregiver may recommend avoiding anti-inflammatory medicines (aspirin, ibuprofen, and naproxen) for 48 hours because these medicines may increase bruising.  Rest the injured area.  If possible, elevate the injured area to reduce swelling. SEEK IMMEDIATE MEDICAL CARE IF:   You have increased bruising or swelling.  You have pain that is getting worse.  Your swelling or pain is not relieved with medicines. MAKE SURE YOU:   Understand these instructions.  Will watch your condition.  Will get help right  away if you are not doing well or get worse. Document Released: 02/07/2005 Document Revised: 05/05/2013 Document Reviewed: 03/05/2011 [facility] Patient Information 2015 [place], [place]. This information is not intended to replace advice given to you by your health care provider. Make sure you discuss any questions you have with your health care provider. RICE: Routine Care for Injuries The routine care of many injuries includes Rest, Ice, Compression, and Elevation (RICE). HOME CARE INSTRUCTIONS  Rest is needed to allow your body to heal. Routine activities can usually be resumed when comfortable. Injured tendons and bones can take up to 6 weeks to heal. Tendons are the cord-like structures that attach muscle to bone.  Ice following an injury helps keep the swelling down and reduces pain.  Put ice in a plastic bag.  Place a towel between your skin and the bag.  Leave the ice on for 15-20 minutes, 3-4 times a day, or as directed by your health care provider. Do this while awake, for the first 24 to 48 hours. After that, continue as directed by your caregiver.  Compression helps keep swelling down. It also gives support and helps with discomfort. If an elastic bandage has been applied, it should be removed and reapplied every 3 to 4 hours. It should not be applied tightly, but firmly enough to keep swelling down. Watch fingers or toes for swelling, bluish discoloration, coldness, numbness, or excessive pain. If any of these problems occur, remove the bandage and reapply loosely. Contact your caregiver if these problems continue.  Elevation helps reduce swelling  and decreases pain. With extremities, such as the arms, hands, legs, and feet, the injured area should be placed near or above the level of the heart, if possible. SEEK IMMEDIATE MEDICAL CARE IF:  You have persistent pain and swelling.  You develop redness, numbness, or unexpected weakness.  Your symptoms are getting worse rather than  improving after several days. These symptoms may indicate that further evaluation or further X-rays are needed. Sometimes, X-rays may not show a small broken bone (fracture) until 1 week or 10 days later. Make a follow-up appointment with your caregiver. Ask when your X-ray results will be ready. Make sure you get your X-ray results. Document Released: 08/12/2000 Document Revised: 05/05/2013 Document Reviewed: 09/29/2010 [facility] Patient Information 2015 [place], [place]. This information is not intended to replace advice given to you by your health care provider. Make sure you discuss any questions you have with your health care provider.

## 2014-06-19 NOTE — ED Notes (Signed)
Patient medicated for pain during DC process Patient upset r/t ED wait time and bilateral knee pain Patient and pt's boyfriend stated that they are not waiting any longer for DC VS or to sign anything Patient in NAD at time of DC home

## 2014-06-19 NOTE — ED Notes (Signed)
Awaiting arrival of patient to ED room 11 from radiology

## 2014-06-19 NOTE — Progress Notes (Signed)
[place] ATTENDING NOTE Keyetta Hollingworth,MD  I agree with the resident's findings, assessment and care plan.

## 2014-07-28 ENCOUNTER — Emergency Department (HOSPITAL_COMMUNITY)
Admission: EM | Admit: 2014-07-28 | Discharge: 2014-07-28 | Discharge status: Against Medical Advice | Payer: Medicaid Other | Attending: Emergency Medicine | Admitting: Emergency Medicine

## 2014-07-28 ENCOUNTER — Ambulatory Visit: Payer: Self-pay | Admitting: Family Medicine

## 2014-07-28 ENCOUNTER — Encounter (HOSPITAL_COMMUNITY): Payer: Self-pay | Admitting: *Deleted

## 2014-07-28 DIAGNOSIS — R05 Cough: Secondary | ICD-10-CM | POA: Insufficient documentation

## 2014-07-28 DIAGNOSIS — G8929 Other chronic pain: Secondary | ICD-10-CM | POA: Diagnosis not present

## 2014-07-28 DIAGNOSIS — R52 Pain, unspecified: Secondary | ICD-10-CM | POA: Diagnosis present

## 2014-07-28 DIAGNOSIS — R112 Nausea with vomiting, unspecified: Secondary | ICD-10-CM | POA: Diagnosis not present

## 2014-07-28 DIAGNOSIS — R51 Headache: Secondary | ICD-10-CM | POA: Insufficient documentation

## 2014-07-28 LAB — COMPREHENSIVE METABOLIC PANEL
ALBUMIN: 3.6 g/dL (ref 3.5–5.2)
ALT: 12 U/L (ref 0–35)
AST: 20 U/L (ref 0–37)
Alkaline Phosphatase: 55 U/L (ref 39–117)
Anion gap: 7 (ref 5–15)
BUN: 7 mg/dL (ref 6–23)
CALCIUM: 8.8 mg/dL (ref 8.4–10.5)
CO2: 23 mmol/L (ref 19–32)
Chloride: 108 mmol/L (ref 96–112)
Creatinine, Ser: 0.86 mg/dL (ref 0.50–1.10)
GFR calc Af Amer: >90 mL/min (ref >90)
GFR, EST NON AFRICAN AMERICAN: 80 mL/min — AB (ref >90)
Glucose, Bld: 107 mg/dL — ABNORMAL HIGH (ref 70–99)
Potassium: 3.6 mmol/L (ref 3.5–5.1)
Sodium: 138 mmol/L (ref 135–145)
TOTAL PROTEIN: 6.7 g/dL (ref 6.0–8.3)
Total Bilirubin: 0.4 mg/dL (ref 0.3–1.2)

## 2014-07-28 LAB — CBC WITH DIFFERENTIAL/PLATELET
Basophils Absolute: 0 10*3/uL (ref 0.0–0.1)
Basophils Relative: 0 % (ref 0–1)
EOS ABS: 0 10*3/uL (ref 0.0–0.7)
Eosinophils Relative: 1 % (ref 0–5)
HEMATOCRIT: 36 % (ref 36.0–46.0)
HEMOGLOBIN: 11.5 g/dL — AB (ref 12.0–15.0)
LYMPHS ABS: 1 10*3/uL (ref 0.7–4.0)
LYMPHS PCT: 31 % (ref 12–46)
MCH: 26.1 pg (ref 26.0–34.0)
MCHC: 31.9 g/dL (ref 30.0–36.0)
MCV: 81.6 fL (ref 78.0–100.0)
MONO ABS: 0.4 10*3/uL (ref 0.1–1.0)
MONOS PCT: 12 % (ref 3–12)
Neutro Abs: 1.9 10*3/uL (ref 1.7–7.7)
Neutrophils Relative %: 56 % (ref 43–77)
PLATELETS: 344 10*3/uL (ref 150–400)
RBC: 4.41 MIL/uL (ref 3.87–5.11)
RDW: 14.8 % (ref 11.5–15.5)
WBC: 3.4 10*3/uL — AB (ref 4.0–10.5)

## 2014-07-28 LAB — LIPASE, BLOOD: LIPASE: 30 U/L (ref 11–59)

## 2014-07-28 NOTE — ED Notes (Signed)
Pt refused to wait, states she will see her pcp

## 2014-07-28 NOTE — ED Notes (Signed)
Pt reports not feeling well since last night. Having bodyaches, headache, n/v and sweating/chills, productive cough.

## 2014-08-02 ENCOUNTER — Ambulatory Visit: Payer: Self-pay | Admitting: Family Medicine

## 2014-08-28 ENCOUNTER — Inpatient Hospital Stay (HOSPITAL_COMMUNITY)
Admission: AD | Admit: 2014-08-28 | Discharge: 2014-08-28 | Disposition: A | Discharge status: Home / Self Care | Payer: Medicaid Other | Source: Ambulatory Visit | Attending: Obstetrics & Gynecology | Admitting: Obstetrics & Gynecology

## 2014-08-28 ENCOUNTER — Emergency Department (INDEPENDENT_AMBULATORY_CARE_PROVIDER_SITE_OTHER)
Admission: EM | Admit: 2014-08-28 | Discharge: 2014-08-28 | Disposition: A | Discharge status: Home / Self Care | Payer: Medicaid Other | Source: Home / Self Care | Attending: Family Medicine | Admitting: Family Medicine

## 2014-08-28 ENCOUNTER — Encounter (HOSPITAL_COMMUNITY): Payer: Self-pay | Admitting: *Deleted

## 2014-08-28 ENCOUNTER — Encounter (HOSPITAL_COMMUNITY): Payer: Self-pay

## 2014-08-28 DIAGNOSIS — Z87891 Personal history of nicotine dependence: Secondary | ICD-10-CM | POA: Diagnosis not present

## 2014-08-28 DIAGNOSIS — B9689 Other specified bacterial agents as the cause of diseases classified elsewhere: Secondary | ICD-10-CM | POA: Insufficient documentation

## 2014-08-28 DIAGNOSIS — N750 Cyst of Bartholin's gland: Secondary | ICD-10-CM | POA: Insufficient documentation

## 2014-08-28 DIAGNOSIS — N760 Acute vaginitis: Secondary | ICD-10-CM | POA: Insufficient documentation

## 2014-08-28 HISTORY — DX: Anxiety disorder, unspecified: F41.9

## 2014-08-28 LAB — URINALYSIS, ROUTINE W REFLEX MICROSCOPIC
BILIRUBIN URINE: NEGATIVE
Glucose, UA: NEGATIVE mg/dL
Ketones, ur: NEGATIVE mg/dL
LEUKOCYTES UA: NEGATIVE
NITRITE: NEGATIVE
PH: 6 (ref 5.0–8.0)
PROTEIN: NEGATIVE mg/dL
Specific Gravity, Urine: >1.03 — ABNORMAL HIGH (ref 1.005–1.030)
Urobilinogen, UA: 0.2 mg/dL (ref 0.0–1.0)

## 2014-08-28 LAB — WET PREP, GENITAL
TRICH WET PREP: NONE SEEN
Yeast Wet Prep HPF POC: NONE SEEN

## 2014-08-28 LAB — URINE MICROSCOPIC-ADD ON

## 2014-08-28 LAB — POCT PREGNANCY, URINE: Preg Test, Ur: NEGATIVE

## 2014-08-28 MED ORDER — ONDANSETRON HCL 4 MG/2ML IJ SOLN
INTRAMUSCULAR | Status: AC
  Filled 2014-08-28: qty 2

## 2014-08-28 MED ORDER — ONDANSETRON HCL 4 MG/2ML IJ SOLN
4.0000 mg | Freq: Once | INTRAMUSCULAR | Status: AC
  Administered 2014-08-28: 4 mg via INTRAMUSCULAR

## 2014-08-28 MED ORDER — HYDROMORPHONE HCL 1 MG/ML IJ SOLN
INTRAMUSCULAR | Status: AC
  Filled 2014-08-28: qty 1

## 2014-08-28 MED ORDER — LIDOCAINE HCL (PF) 1 % IJ SOLN
INTRAMUSCULAR | Status: AC
  Filled 2014-08-28: qty 30

## 2014-08-28 MED ORDER — HYDROMORPHONE HCL 1 MG/ML IJ SOLN
1.0000 mg | Freq: Once | INTRAMUSCULAR | Status: AC
  Administered 2014-08-28: 1 mg via INTRAMUSCULAR

## 2014-08-28 MED ORDER — LIDOCAINE 5 % EX OINT
1.0000 "application " | TOPICAL_OINTMENT | Freq: Once | CUTANEOUS | Status: AC
  Administered 2014-08-28: 1 via TOPICAL
  Filled 2014-08-28: qty 35.44

## 2014-08-28 MED ORDER — METRONIDAZOLE 500 MG PO TABS
500.0000 mg | ORAL_TABLET | Freq: Two times a day (BID) | ORAL | Status: DC
Start: 2014-08-28 — End: 2014-10-10

## 2014-08-28 NOTE — ED Provider Notes (Addendum)
CSN: [id number]     Arrival date & time 08/28/14  1331 History   First MD Initiated Contact with Patient 08/28/14 1432     Chief Complaint  Patient presents with  . Vaginal Pain   (Consider location/radiation/quality/duration/timing/severity/associated sxs/prior Treatment) Patient is a 44 y.o. female presenting with vaginal pain. The history is provided by the patient.  Vaginal Pain This is a new problem. The current episode started more than 2 days ago. The problem has not changed since onset.Pertinent negatives include no abdominal pain.    Past Medical History  Diagnosis Date  . Scoliosis   . Complication of anesthesia     difficult waking up "  . Chest pain   . Depression   . Chronic back pain greater than 3 months duration   . Eczema   . Cellulitis    Past Surgical History  Procedure Laterality Date  . Tubal ligation     No family history on file. History  Substance Use Topics  . Smoking status: Former Smoker -- 14 years    Quit date: 09/20/2010  . Smokeless tobacco: Never Used  . Alcohol Use: No   OB History    No data available     Review of Systems  Constitutional: Negative.   Gastrointestinal: Negative for nausea, vomiting and abdominal pain.  Genitourinary: Positive for vaginal pain. Negative for vaginal bleeding, vaginal discharge and pelvic pain.    Allergies  Amoxicillin; Latex; Naproxen; and Tramadol  Home Medications   Prior to Admission medications   Medication Sig Start Date End Date Taking? Authorizing Provider  cyclobenzaprine (FLEXERIL) 5 MG tablet Take 1-2 tablets (5-10 mg total) by mouth 3 (three) times daily as needed for muscle spasms. 06/18/14   Renee A Kuneff, DO  gabapentin (NEURONTIN) 400 MG capsule Take 1 capsule (400 mg total) by mouth 3 (three) times daily. Start with 1 capsule at night, then increase by 1 cap daily until you reach 3 caps 3 times a day 06/18/14   Renee A Kuneff, DO  ibuprofen (ADVIL,MOTRIN) 800 MG tablet Take 1 tablet  (800 mg total) by mouth 3 (three) times daily. 03/28/14   Carlisle Cater, PA-C  omeprazole (PRILOSEC) 20 MG capsule Take 1 capsule (20 mg total) by mouth daily. Patient not taking: Reported on 03/28/2014 12/10/13   Renee A Kuneff, DO  oxyCODONE-acetaminophen (PERCOCET/ROXICET) 5-325 MG per tablet Take 1 tablet by mouth every 6 (six) hours as needed for severe pain. 06/18/14   Renee A Kuneff, DO  predniSONE (DELTASONE) 50 MG tablet Take one tab daily for 5 days 06/18/14   Renee A Kuneff, DO   BP 151/84 mmHg  Pulse 86  Temp(Src) 99.3 F (37.4 C) (Oral)  Resp 16  SpO2 100%  LMP 08/12/2014 Physical Exam  Constitutional: She is oriented to person, place, and time. She appears well-developed and well-nourished. No distress.  Abdominal: Soft. Bowel sounds are normal. She exhibits no mass. There is no tenderness.  Genitourinary:    Pelvic exam was performed with patient supine. There is lesion on the right labia. No vaginal discharge found.  Neurological: She is alert and oriented to person, place, and time.  Skin: Skin is warm and dry.  Nursing note and vitals reviewed.   ED Course  Procedures (including critical care time) Labs Review Labs Reviewed - No data to display  Imaging Review No results found.   MDM   1. Bartholin's duct cyst        Billy Fischer, MD  08/28/14 [place], MD 08/28/14 1515

## 2014-08-28 NOTE — ED Notes (Signed)
Pt  Has  A   painfull  Swollen  Tender  Area  To     Vaginal  Area     X  4  Days  With  Pressure  When  She  Urinates        Pt reports   Has  Had   An  abcess  In past        She    Also  Reports      Symptoms      Of  Sinus  Drainage

## 2014-08-28 NOTE — MAU Note (Signed)
Pt states for past four days has felt a lot of vaginal pressure. Has large cyst on right lower labia. Denies abnormal vaginal discharge. Did have burning with voiding first 2 days however has since stopped.

## 2014-08-28 NOTE — Discharge Instructions (Signed)
[facility] as desired

## 2014-08-28 NOTE — MAU Provider Note (Signed)
History     CSN: [id number]  Arrival date and time: 08/28/14 1556   First Provider Initiated Contact with Patient 08/28/14 1643      Chief Complaint  Patient presents with  . Bartholin's Cyst   HPI 44 y.o. [id number] with vaginal pressure and swelling in R labia x 2 days. Went to [facility] today and was told it was a Bartholin's cyst and to come to MAU for further eval. Has never had Bartholin's cyst before. Also having some vaginal discharge.   Past Medical History  Diagnosis Date  . Scoliosis   . Complication of anesthesia     difficult waking up "  . Chest pain   . Depression   . Chronic back pain greater than 3 months duration   . Eczema   . Cellulitis   . Anxiety     Past Surgical History  Procedure Laterality Date  . Tubal ligation      History reviewed. No pertinent family history.  History  Substance Use Topics  . Smoking status: Former Smoker -- 14 years    Quit date: 09/20/2010  . Smokeless tobacco: Never Used  . Alcohol Use: No    Allergies:  Allergies  Allergen Reactions  . Amoxicillin Itching and Swelling  . Latex Other (See Comments)    Itching and burns skin  . Naproxen Hives and Itching    Takes IBU without difficulty  . Tramadol Hives and Itching    Takes IBU without difficulty    No prescriptions prior to admission    Review of Systems  Constitutional: Negative.   Respiratory: Negative.   Cardiovascular: Negative.   Gastrointestinal: Negative for nausea, vomiting, abdominal pain, diarrhea and constipation.  Genitourinary: Negative for dysuria, urgency, frequency, hematuria and flank pain.       + discharge  Musculoskeletal: Negative.   Neurological: Negative.   Psychiatric/Behavioral: Negative.    Physical Exam   Blood pressure 142/84, pulse 68, temperature 98.3 F (36.8 C), temperature source Oral, resp. rate 16, height 5\' 5"  (1.651 m), weight 181 lb (82.101 kg), last menstrual period 08/12/2014.  Physical Exam    Nursing note and vitals reviewed. Constitutional: She is oriented to person, place, and time. She appears well-developed and well-nourished. No distress.  Cardiovascular: Normal rate.   Respiratory: Effort normal.  Genitourinary:    There is tenderness on the right labia. Vaginal discharge (odor) found.  Neurological: She is oriented to person, place, and time.  Skin: Skin is warm and dry.  Psychiatric: She has a normal mood and affect.    MAU Course  Procedures I&D of bartholin's cyst:  Lidocaine gel applied prior to procedure Area cleansed x 3 w/ betadine Small incision made in cyst, drained moderate amount of thin, clear, nonpurulent drainage Complete resolution of cyst, immediate improvement of pain  Results for orders placed or performed during the hospital encounter of 08/28/14 (from the past 24 hour(s))  Urinalysis, Routine w reflex microscopic     Status: Abnormal   Collection Time: 08/28/14  4:14 PM  Result Value Ref Range   Color, Urine YELLOW YELLOW   APPearance CLEAR CLEAR   Specific Gravity, Urine >1.030 (H) 1.005 - 1.030   pH 6.0 5.0 - 8.0   Glucose, UA NEGATIVE NEGATIVE mg/dL   Hgb urine dipstick TRACE (A) NEGATIVE   Bilirubin Urine NEGATIVE NEGATIVE   Ketones, ur NEGATIVE NEGATIVE mg/dL   Protein, ur NEGATIVE NEGATIVE mg/dL   Urobilinogen, UA 0.2 0.0 - 1.0 mg/dL  Nitrite NEGATIVE NEGATIVE   Leukocytes, UA NEGATIVE NEGATIVE  Urine microscopic-add on     Status: Abnormal   Collection Time: 08/28/14  4:14 PM  Result Value Ref Range   Squamous Epithelial / LPF FEW (A) RARE   WBC, UA 0-2 <3 WBC/hpf   RBC / HPF 0-2 <3 RBC/hpf   Bacteria, UA RARE RARE  Pregnancy, urine POC     Status: None   Collection Time: 08/28/14  4:20 PM  Result Value Ref Range   Preg Test, Ur NEGATIVE NEGATIVE  Wet prep, genital     Status: Abnormal   Collection Time: 08/28/14  4:50 PM  Result Value Ref Range   Yeast Wet Prep HPF POC NONE SEEN NONE SEEN   Trich, Wet Prep NONE SEEN  NONE SEEN   Clue Cells Wet Prep HPF POC FEW (A) NONE SEEN   WBC, Wet Prep HPF POC FEW (A) NONE SEEN     Assessment and Plan   1. Bartholin's cyst   2. BV (bacterial vaginosis)   Bartholin's I&D, f/u w/ PCP  Flagyl for BV    Medication List    TAKE these medications        cyclobenzaprine 5 MG tablet  Commonly known as:  FLEXERIL  Take 1-2 tablets (5-10 mg total) by mouth 3 (three) times daily as needed for muscle spasms.     gabapentin 400 MG capsule  Commonly known as:  NEURONTIN  Take 1 capsule (400 mg total) by mouth 3 (three) times daily. Start with 1 capsule at night, then increase by 1 cap daily until you reach 3 caps 3 times a day     ibuprofen 800 MG tablet  Commonly known as:  ADVIL,MOTRIN  Take 1 tablet (800 mg total) by mouth 3 (three) times daily.     metroNIDAZOLE 500 MG tablet  Commonly known as:  FLAGYL  Take 1 tablet (500 mg total) by mouth 2 (two) times daily.     omeprazole 20 MG capsule  Commonly known as:  PRILOSEC  Take 1 capsule (20 mg total) by mouth daily.     oxyCODONE-acetaminophen 5-325 MG per tablet  Commonly known as:  PERCOCET/ROXICET  Take 1 tablet by mouth every 6 (six) hours as needed for severe pain.     predniSONE 50 MG tablet  Commonly known as:  DELTASONE  Take one tab daily for 5 days            Follow-up Information    Follow up with Kuneff, Renee, DO.   Specialty:  Family Medicine   Why:  As needed   Contact information:   [place] [address] [phone]         [facility] 08/28/2014, 7:39 PM

## 2014-08-29 LAB — HIV ANTIBODY (ROUTINE TESTING W REFLEX): HIV Screen 4th Generation wRfx: NONREACTIVE

## 2014-10-10 ENCOUNTER — Emergency Department (INDEPENDENT_AMBULATORY_CARE_PROVIDER_SITE_OTHER)
Admission: EM | Admit: 2014-10-10 | Discharge: 2014-10-10 | Disposition: A | Discharge status: Home / Self Care | Payer: Medicaid Other | Source: Home / Self Care | Attending: Emergency Medicine | Admitting: Emergency Medicine

## 2014-10-10 ENCOUNTER — Encounter (HOSPITAL_COMMUNITY): Payer: Self-pay | Admitting: *Deleted

## 2014-10-10 DIAGNOSIS — S46001A Unspecified injury of muscle(s) and tendon(s) of the rotator cuff of right shoulder, initial encounter: Secondary | ICD-10-CM

## 2014-10-10 DIAGNOSIS — M545 Low back pain, unspecified: Secondary | ICD-10-CM

## 2014-10-10 DIAGNOSIS — J309 Allergic rhinitis, unspecified: Secondary | ICD-10-CM | POA: Diagnosis not present

## 2014-10-10 MED ORDER — METHYLPREDNISOLONE ACETATE 40 MG/ML IJ SUSP
INTRAMUSCULAR | Status: AC
  Filled 2014-10-10: qty 1

## 2014-10-10 MED ORDER — CYCLOBENZAPRINE HCL 5 MG PO TABS: 5.0000 mg | ORAL_TABLET | Freq: Three times a day (TID) | ORAL | Status: DC | PRN

## 2014-10-10 MED ORDER — FLUTICASONE PROPIONATE 50 MCG/ACT NA SUSP: 2.0000 | Freq: Every day | NASAL | Status: DC

## 2014-10-10 NOTE — ED Provider Notes (Signed)
CSN: [id number]     Arrival date & time 10/10/14  3086 History   First MD Initiated Contact with Patient 10/10/14 [phone]     Chief Complaint  Patient presents with  . Facial Pain  . Arm Pain  . Back Pain   (Consider location/radiation/quality/duration/timing/severity/associated sxs/prior Treatment) HPI  She is a 44 year old woman here for evaluation of sinus congestion and right shoulder pain. She states she has had sinus congestion and nasal discharge for the last 3 days. She also reports sinus pressure and headache. She has been taking Zyrtec-D with some improvement. She also reports about 3 days ago she developed pain in her right shoulder. It will radiate into her upper arm. She also reports stiffness and tightness going down the right side of her back.  No specific injury, but she does work as a Psychologist, counselling and helps move patients. No numbness, tingling, weakness in the upper extremities.  Past Medical History  Diagnosis Date  . Scoliosis   . Complication of anesthesia     difficult waking up "  . Chest pain   . Depression   . Chronic back pain greater than 3 months duration   . Eczema   . Cellulitis   . Anxiety   . Depression   . Asthma    Past Surgical History  Procedure Laterality Date  . Tubal ligation     No family history on file. History  Substance Use Topics  . Smoking status: Former Smoker -- 14 years    Quit date: 09/20/2010  . Smokeless tobacco: Never Used  . Alcohol Use: No   OB History    Gravida Para Term Preterm AB TAB SAB Ectopic Multiple Living   7 6 4 2 1   1  6      Review of Systems As in history of present illness Allergies  Amoxicillin; Latex; Naproxen; and Tramadol  Home Medications   Prior to Admission medications   Medication Sig Start Date End Date Taking? Authorizing Provider  ibuprofen (ADVIL,MOTRIN) 800 MG tablet Take 1 tablet (800 mg total) by mouth 3 (three) times daily. 03/28/14  Yes Carlisle Cater, PA-C  cyclobenzaprine  (FLEXERIL) 5 MG tablet Take 1-2 tablets (5-10 mg total) by mouth 3 (three) times daily as needed for muscle spasms. 10/10/14   Melony Overly, MD  fluticasone (FLONASE) 50 MCG/ACT nasal spray Place 2 sprays into both nostrils daily. 10/10/14   Melony Overly, MD  gabapentin (NEURONTIN) 400 MG capsule Take 1 capsule (400 mg total) by mouth 3 (three) times daily. Start with 1 capsule at night, then increase by 1 cap daily until you reach 3 caps 3 times a day 06/18/14   Renee A Kuneff, DO  omeprazole (PRILOSEC) 20 MG capsule Take 1 capsule (20 mg total) by mouth daily. Patient not taking: Reported on 03/28/2014 12/10/13   Renee A Kuneff, DO   BP 164/85 mmHg  Pulse 71  Temp(Src) 98.4 F (36.9 C) (Oral)  Resp 16  SpO2 100%  LMP 09/10/2014 (Approximate) Physical Exam  Constitutional: She is oriented to person, place, and time. She appears well-developed and well-nourished. She appears distressed ( tearful).  HENT:  Nose: Mucosal edema and rhinorrhea present. Right sinus exhibits no maxillary sinus tenderness and no frontal sinus tenderness. Left sinus exhibits no maxillary sinus tenderness and no frontal sinus tenderness.  Mouth/Throat: Oropharynx is clear and moist. No oropharyngeal exudate.  Neck: Neck supple.  Cardiovascular: Normal rate.   Pulmonary/Chest: Effort normal.  Musculoskeletal:  Right shoulder: Tender over supraspinatus. Full range of motion. Positive Hawkins an empty can test. Back: No vertebral tenderness. She does have some mild spasm along the right paraspinous muscles.  Lymphadenopathy:    She has no cervical adenopathy.  Neurological: She is alert and oriented to person, place, and time.    ED Course  Injection of joint Date/Time: 10/10/2014 10:54 AM Performed by: Melony Overly Authorized by: Melony Overly Consent: Verbal consent obtained. Risks and benefits: risks, benefits and alternatives were discussed Consent given by: patient Patient understanding: patient states  understanding of the procedure being performed Patient identity confirmed: verbally with patient Time out: Immediately prior to procedure a "time out" was called to verify the correct patient, procedure, equipment, support staff and site/side marked as required. Patient tolerance: Patient tolerated the procedure well with no immediate complications Comments: Skin was prepped with alcohol. Cold spray was used for local anesthetic. 40 mg Depo-Medrol with 3 mL of 2% lidocaine was injected into the right shoulder. Patient tolerated procedure well without immediate complication.   (including critical care time) Labs Review Labs Reviewed - No data to display  Imaging Review No results found.   MDM   1. Injury of right rotator cuff, initial encounter   2. Allergic sinusitis   3. Right-sided low back pain without sciatica    Steroid injection done today for the right shoulder. Flexeril 3 times a day as needed for muscle spasms. Continue Zyrtec and add Flonase for allergic sinusitis. Her blood pressure is elevated today, but she does report significant pain. She will follow-up with her PCP in the next 1-2 weeks for a blood pressure recheck.   Melony Overly, MD 10/10/14 1055

## 2014-10-10 NOTE — ED Notes (Addendum)
Pt c/o HAs, frontal and maxillary sinus pressure x3 days.  Denies cough or congestion.  Also c/o "stiff pain" starting in right trapezius area radiating down into RUE x3 days without any injury or unusual activity.  Pain worse with movement and palpation.  Also c/o low back spasms.  Pt very tearful.  States her BP was 170/110 and 150/117 3 days ago when checked at work - was told to go to hospital, but pt "was scared" and went home to rest instead.  Denies any vision changes.

## 2014-10-10 NOTE — Discharge Instructions (Signed)
Your sinus congestion is likely from allergies. Continue taking the Zyrtec. Add Flonase daily.  Your shoulder pain is likely from irritation to your rotator cuff muscles. We did a steroidal injection today. Apply ice to your shoulder several times today. Take Flexeril 3 times a day as needed for muscle spasms.  Your blood pressure is elevated today, but it is hard to know if this is due to pain.  Please follow-up with your regular doctor in the next 1-2 weeks for a blood pressure recheck.

## 2014-10-11 ENCOUNTER — Telehealth: Payer: Self-pay | Admitting: Family Medicine

## 2014-10-11 MED ORDER — AMLODIPINE BESYLATE 5 MG PO TABS: 5.0000 mg | ORAL_TABLET | Freq: Every day | ORAL | Status: DC

## 2014-10-11 NOTE — Telephone Encounter (Signed)
Family Medicine After hours phone call  Pt calls in requesting blood pressure medicine. She was seen in urgent care yesterday and had an elevated BP at that time. She reports it's been "months" and "some time" since she has taken any blood pressure medicines. I reviewed Epic records and do not see any blood pressure medicines prescribed electronically for the past 3 years. Pt reports BP of 150 today. I informed her that I would be okay prescribing her a medicine to help control her BP as it is 2 elevated readings, on the condition that she gets an appt to be seen within the next week. Pt is agreeable to plan. Amlodipine 5mg  #30 sent in, discussed possibility of leg swelling; when asked pt did think "Norvasc" sounded familiar. Pt had no further questions.  Tawanna Sat, MD 10/11/2014, 10:52 AM PGY-2, [place]

## 2015-01-24 ENCOUNTER — Encounter: Payer: Self-pay | Admitting: Family Medicine

## 2015-02-10 ENCOUNTER — Encounter: Payer: Self-pay | Admitting: Family Medicine

## 2015-03-12 ENCOUNTER — Telehealth: Payer: Self-pay | Admitting: Obstetrics and Gynecology

## 2015-03-12 NOTE — Telephone Encounter (Signed)
Family Medicine Emergency Line Telephone Note   Patient called after hours emergency line stating that for the last 3 days she has not been feeling well. She describes headache, body aches, back and chest pain. States that she has had associated fever (101.71F) and URI symptoms. She has not been seen by any providers for these symptoms and has not tried any medication.  Discussed with the patient that it is hard for me to evaluate her over the phone and encouraged her to go to an urgent care to be assessed if she is concerned. Reviewing her chart she has had recurrent URIs and sinusitis in the past.   Patient voiced understanding and agreed to plan.   Luiz Blare, DO 03/12/2015, 9:59 AM PGY-2, [place]

## 2015-06-23 ENCOUNTER — Emergency Department (INDEPENDENT_AMBULATORY_CARE_PROVIDER_SITE_OTHER)
Admission: EM | Admit: 2015-06-23 | Discharge: 2015-06-23 | Disposition: A | Discharge status: Home / Self Care | Payer: Medicaid Other | Source: Home / Self Care | Attending: Family Medicine | Admitting: Family Medicine

## 2015-06-23 DIAGNOSIS — J111 Influenza due to unidentified influenza virus with other respiratory manifestations: Secondary | ICD-10-CM | POA: Diagnosis not present

## 2015-06-23 MED ORDER — AMLODIPINE BESYLATE 10 MG PO TABS: 10.0000 mg | ORAL_TABLET | Freq: Every day | ORAL | Status: DC

## 2015-06-23 MED ORDER — OSELTAMIVIR PHOSPHATE 75 MG PO CAPS: 75.0000 mg | ORAL_CAPSULE | Freq: Two times a day (BID) | ORAL | Status: DC

## 2015-06-23 MED ORDER — ONDANSETRON HCL 4 MG PO TABS: 4.0000 mg | ORAL_TABLET | Freq: Four times a day (QID) | ORAL | Status: DC

## 2015-06-23 NOTE — ED Provider Notes (Signed)
CSN: [id number]     Arrival date & time 06/23/15  1746 History   First MD Initiated Contact with Patient 06/23/15 1855     Chief Complaint  Patient presents with  . Generalized Body Aches   (Consider location/radiation/quality/duration/timing/severity/associated sxs/prior Treatment) HPI Ill for 2 days with body aches, fever, not feeling well. OTC meds not helping.  Past Medical History  Diagnosis Date  . Scoliosis   . Complication of anesthesia     difficult waking up "  . Chest pain   . Depression   . Chronic back pain greater than 3 months duration   . Eczema   . Cellulitis   . Anxiety   . Depression   . Asthma    Past Surgical History  Procedure Laterality Date  . Tubal ligation     No family history on file. Social History  Substance Use Topics  . Smoking status: Former Smoker -- 14 years    Quit date: 09/20/2010  . Smokeless tobacco: Never Used  . Alcohol Use: No   OB History    Gravida Para Term Preterm AB TAB SAB Ectopic Multiple Living   7 6 4 2 1   1  6      Review of Systems ROS +'ve body aches  Denies: HEADACHE, NAUSEA, ABDOMINAL PAIN, CHEST PAIN, CONGESTION, DYSURIA, SHORTNESS OF BREATH  Allergies  Amoxicillin; Latex; Naproxen; and Tramadol  Home Medications   Prior to Admission medications   Medication Sig Start Date End Date Taking? Authorizing Provider  amLODipine (NORVASC) 5 MG tablet Take 1 tablet (5 mg total) by mouth daily. 10/11/14   Leone Brand, MD  cyclobenzaprine (FLEXERIL) 5 MG tablet Take 1-2 tablets (5-10 mg total) by mouth 3 (three) times daily as needed for muscle spasms. 10/10/14   Melony Overly, MD  fluticasone (FLONASE) 50 MCG/ACT nasal spray Place 2 sprays into both nostrils daily. 10/10/14   Melony Overly, MD  gabapentin (NEURONTIN) 400 MG capsule Take 1 capsule (400 mg total) by mouth 3 (three) times daily. Start with 1 capsule at night, then increase by 1 cap daily until you reach 3 caps 3 times a day 06/18/14   Renee A Kuneff, DO   ibuprofen (ADVIL,MOTRIN) 800 MG tablet Take 1 tablet (800 mg total) by mouth 3 (three) times daily. 03/28/14   Carlisle Cater, PA-C  omeprazole (PRILOSEC) 20 MG capsule Take 1 capsule (20 mg total) by mouth daily. Patient not taking: Reported on 03/28/2014 12/10/13   Renee A Kuneff, DO   Meds Ordered and Administered this Visit  Medications - No data to display  BP 145/105 mmHg  Pulse 76  Temp(Src) 98.6 F (37 C) (Oral)  Resp 20  SpO2 100% No data found.   Physical Exam  Constitutional: She is oriented to person, place, and time. She appears well-developed and well-nourished. No distress.  HENT:  Head: Normocephalic and atraumatic.  Right Ear: External ear normal.  Left Ear: External ear normal.  Mouth/Throat: Oropharynx is clear and moist.  Eyes: Conjunctivae are normal.  Neck: Normal range of motion. Neck supple.  Pulmonary/Chest: Effort normal and breath sounds normal.  Abdominal: Soft. Bowel sounds are normal.  Musculoskeletal: Normal range of motion.  Neurological: She is alert and oriented to person, place, and time.  Skin: Skin is warm and dry.  Psychiatric: She has a normal mood and affect. Her behavior is normal.  Nursing note and vitals reviewed.   ED Course  Procedures (including critical care time)  Labs Review Labs Reviewed - No data to display  Imaging Review No results found.   Visual Acuity Review  Right Eye Distance:   Left Eye Distance:   Bilateral Distance:    Right Eye Near:   Left Eye Near:    Bilateral Near:         MDM   1. Influenza    Patient is advised to continue home symptomatic treatment. Prescription for Tamiflu sent pharmacy patient has indicated. Patient is advised that if there are new or worsening symptoms or attend the emergency department, or contact primary care provider. Instructions of care provided discharged home in stable condition. Return to work/school note provided.  THIS NOTE WAS GENERATED USING A VOICE  RECOGNITION SOFTWARE PROGRAM. ALL REASONABLE EFFORTS  WERE MADE TO PROOFREAD THIS DOCUMENT FOR ACCURACY.     Konrad Felix, [place] 06/23/15 1918

## 2015-06-23 NOTE — ED Notes (Signed)
Pt re 

## 2015-06-23 NOTE — Discharge Instructions (Signed)

## 2015-06-24 ENCOUNTER — Telehealth: Payer: Self-pay | Admitting: Family Medicine

## 2015-06-24 NOTE — Telephone Encounter (Signed)
Pt was seen last night at [facility] and prescribed medication that was sent to [facility]. The issue is [facility] said they do not have this. Can we call over there. jw

## 2015-06-24 NOTE — Telephone Encounter (Signed)
Patient will need to call back over to Urgent Care and have one of their nurses or CMAs call [facility] because she was not seen at [facility].  Derl Barrow, RN

## 2015-07-14 ENCOUNTER — Ambulatory Visit (INDEPENDENT_AMBULATORY_CARE_PROVIDER_SITE_OTHER): Payer: Medicaid Other | Admitting: Family Medicine

## 2015-07-14 ENCOUNTER — Telehealth: Payer: Self-pay | Admitting: *Deleted

## 2015-07-14 VITALS — BP 159/87 | HR 81 | Temp 98.0°F | Wt 165.5 lb

## 2015-07-14 DIAGNOSIS — R3915 Urgency of urination: Secondary | ICD-10-CM | POA: Diagnosis not present

## 2015-07-14 DIAGNOSIS — R112 Nausea with vomiting, unspecified: Secondary | ICD-10-CM

## 2015-07-14 DIAGNOSIS — R42 Dizziness and giddiness: Secondary | ICD-10-CM

## 2015-07-14 DIAGNOSIS — R109 Unspecified abdominal pain: Secondary | ICD-10-CM

## 2015-07-14 DIAGNOSIS — I10 Essential (primary) hypertension: Secondary | ICD-10-CM | POA: Diagnosis not present

## 2015-07-14 LAB — COMPLETE METABOLIC PANEL WITH GFR
ALBUMIN: 3.8 g/dL (ref 3.6–5.1)
ALK PHOS: 44 U/L (ref 33–115)
ALT: 9 U/L (ref 6–29)
AST: 16 U/L (ref 10–35)
BUN: 9 mg/dL (ref 7–25)
CALCIUM: 9.4 mg/dL (ref 8.6–10.2)
CO2: 27 mmol/L (ref 20–31)
CREATININE: 0.74 mg/dL (ref 0.50–1.10)
Chloride: 104 mmol/L (ref 98–110)
GFR, Est African American: >89 mL/min (ref >=60)
GFR, Est Non African American: >89 mL/min (ref >=60)
GLUCOSE: 80 mg/dL (ref 65–99)
POTASSIUM: 3.9 mmol/L (ref 3.5–5.3)
SODIUM: 137 mmol/L (ref 135–146)
TOTAL PROTEIN: 6.7 g/dL (ref 6.1–8.1)
Total Bilirubin: 0.3 mg/dL (ref 0.2–1.2)

## 2015-07-14 LAB — POCT URINALYSIS DIPSTICK
BILIRUBIN UA: NEGATIVE
GLUCOSE UA: NEGATIVE
Ketones, UA: NEGATIVE
NITRITE UA: NEGATIVE
Protein, UA: NEGATIVE
RBC UA: NEGATIVE
SPEC GRAV UA: 1.02
Urobilinogen, UA: 0.2
pH, UA: 6

## 2015-07-14 LAB — CBC
HEMATOCRIT: 36 % (ref 36.0–46.0)
HEMOGLOBIN: 11.9 g/dL — AB (ref 12.0–15.0)
MCH: 27.3 pg (ref 26.0–34.0)
MCHC: 33.1 g/dL (ref 30.0–36.0)
MCV: 82.6 fL (ref 78.0–100.0)
MPV: 9.1 fL (ref 8.6–12.4)
Platelets: 361 10*3/uL (ref 150–400)
RBC: 4.36 MIL/uL (ref 3.87–5.11)
RDW: 15.9 % — ABNORMAL HIGH (ref 11.5–15.5)
WBC: 6.1 10*3/uL (ref 4.0–10.5)

## 2015-07-14 LAB — TSH: TSH: 0.58 m[IU]/L

## 2015-07-14 LAB — POCT UA - MICROSCOPIC ONLY

## 2015-07-14 LAB — POCT URINE PREGNANCY: Preg Test, Ur: NEGATIVE

## 2015-07-14 MED ORDER — CEPHALEXIN 500 MG PO CAPS: 500.0000 mg | ORAL_CAPSULE | Freq: Two times a day (BID) | ORAL | Status: DC

## 2015-07-14 NOTE — Assessment & Plan Note (Signed)
Mildly elevated today. Will continue amlodipine. Will follow up with PCP in 1-2 weeks.

## 2015-07-14 NOTE — Progress Notes (Signed)
Subjective:  Susan Sanders is a 45 y.o. female who presents to the [facility] today with a chief complaint of dizziness and nausea.   HPI:  Dizziness/nausea Symptoms started as 3 days ago. Describes the sensation of dizziness as like she is going to pass out. Says that she just feels "unwell." Patient denies any syncope. No weakness or numbness. No room spinning sensation. Dizziness is worse with movement and sitting up. Patient also reports headache which is a chronic problem for patient, though worsened over the past few days. Patient also notes increased urinary urgency with no dysuria. She has had several episodes of nonbloody diarrhea over the past week. Sanders in LLQ which is sharp and nonradiating. No LE swelling. No rashes. No fevers. No polydipsia. Patient took ibuprofen which she reports helping with her symptoms.  Patient was started on amlodipine last month for hypertension. No recent illnesses, though reports that several of her coworkers are sick.  ROS: Per HPI  PMH: Smoking history reviewed.     Objective:  Physical Exam: BP 159/87 mmHg  Pulse 81  Temp(Src) 98 F (36.7 C) (Oral)  Wt 165 lb 8 oz (75.07 kg)  SpO2 100%  Gen: NAD, resting comfortably HEENT: EOMI, PERRL. MMM.  CV: RRR with no murmurs appreciated Pulm: NWOB, CTAB with no crackles, wheezes, or rhonchi GI: Normal bowel sounds present. Mildly tender in LLQ. No distension. No rebound. Negative Murphy's sign. No Sanders at McBurney's point. MSK: no edema, cyanosis, or clubbing noted. Mild left sided CVA tenderness. Skin: warm, dry Neuro: grossly normal, moves all extremities. Dix-Hallpike negative. Finger-nose-finger and rapid alternating movements normal bilaterally. Psych: Normal affect and thought content  Results for orders placed or performed in visit on 07/14/15 (from the past 72 hour(s))  POCT urinalysis dipstick     Status: Abnormal   Collection Time: 07/14/15  4:45 PM  Result Value Ref Range   Color, UA  YELLOW    Clarity, UA CLEAR    Glucose, UA NEG    Bilirubin, UA NEG    Ketones, UA NEG    Spec Grav, UA 1.020    Blood, UA NEG    pH, UA 6.0    Protein, UA NEG    Urobilinogen, UA 0.2    Nitrite, UA NEG    Leukocytes, UA small (1+) (A) Negative  POCT urine pregnancy     Status: None   Collection Time: 07/14/15  4:45 PM  Result Value Ref Range   Preg Test, Ur Negative Negative  POCT UA - Microscopic Only     Status: None   Collection Time: 07/14/15  4:45 PM  Result Value Ref Range   WBC, Ur, HPF, POC 3-8    RBC, urine, microscopic RARE    Bacteria, U Microscopic TRACE    Mucus, UA 2+    Epithelial cells, urine per micros OCCASIONAL    Filed Vitals:   07/14/15 1618 07/14/15 1700 07/14/15 1703 07/14/15 1706  BP: 154/77 156/70 158/72 159/87  Pulse: 75 70 72 81  Temp: 98 F (36.7 C)     TempSrc: Oral     Weight: 165 lb 8 oz (75.07 kg)     SpO2: 100% 100% 100% 100%    Assessment/Plan:  Dizziness Unclear etiology. Patient with a constellation of vague complaints and symptoms including lightheadedness, nausea, back/flank Sanders, diarrhea,  headache, and malaise. No signs of vertigo. Neurological exam normal. Orthostatic vitals normal. Urine pregnancy test negative. No palpitations or syncopal events to suggest  cardiac etiology. This may be a component of her chronic migraines. Patient has history of anxiety and depression which may also be contributing. Additionally, she may have UTI which could be contributing. Will check CBC, CMET, and TSH.   Urinary Urgency UA equivocal with small leuks and 3-8 WBC. Given constellation of symptoms, will treat with course of keflex for uncomplicated UTI. Will send for culture. If negative, will stop antibiotics.   Hypertension Mildly elevated today. Will continue amlodipine. Will follow up with PCP in 1-2 weeks.   Susan Sanders. Susan Sanders, [place] [facility] Resident PGY-2 07/14/2015 5:33 PM

## 2015-07-14 NOTE — Patient Instructions (Signed)
You may have a urinary tract infection causing your symptoms. We will start an antibiotic. Please take 1 pill twice a day for the next week.   We will not get your blood tests until tomorrow. If we need to make any changes, we will call you.  Please make sure that you are staying well hydrated.  Please come back soon to meet your regular doctor, Dr Lamar Benes.   Take care,  Dr Jerline Pain

## 2015-07-14 NOTE — Telephone Encounter (Signed)
Returned patient call Patient c/o dizziness, headache which she describes as different than migraines, and "heavy" chest. Patient denies chest pain, numbness or tingling, jaw pain. States heaviness feels like hernia she had in past and like she needs to burp but can't. Patient is currently driving. Instructed pt to come directly to office to be seen. Placed on Dr. Marigene Ehlers schedule. Velora Heckler, RN

## 2015-07-15 ENCOUNTER — Encounter: Payer: Self-pay | Admitting: Family Medicine

## 2015-07-15 ENCOUNTER — Ambulatory Visit (INDEPENDENT_AMBULATORY_CARE_PROVIDER_SITE_OTHER): Payer: Medicaid Other | Admitting: Family Medicine

## 2015-07-15 VITALS — BP 156/72 | HR 71 | Temp 98.1°F | Ht 65.0 in | Wt 163.3 lb

## 2015-07-15 DIAGNOSIS — R112 Nausea with vomiting, unspecified: Secondary | ICD-10-CM | POA: Diagnosis not present

## 2015-07-15 LAB — URINE CULTURE
COLONY COUNT: NO GROWTH
ORGANISM ID, BACTERIA: NO GROWTH

## 2015-07-15 MED ORDER — CEPHALEXIN 500 MG PO CAPS: 500.0000 mg | ORAL_CAPSULE | Freq: Two times a day (BID) | ORAL | Status: AC

## 2015-07-15 NOTE — Patient Instructions (Addendum)
Thank you for coming to see me today. It was a pleasure. Today we talked about:   Nausea: This might be related to your recent diagnosis. I will increase your antibiotic regimen. Please take the Keflex 4 times daily. I have sent another prescription to the pharmacy for pick up. If you have worsening symptoms, please head to the emergency department. Take Tylenol 500-1000mg  as needed every 8 hours or ibuprofen 600mg  every 8 hours as needed. Continue zofran for your nausea  Please make an appointment to see [place] in one week.  If you have any questions or concerns, please do not hesitate to call the office at [phone].  Sincerely,  Cordelia Poche, MD

## 2015-07-15 NOTE — Progress Notes (Signed)
    Subjective   Susan Sanders is a 45 y.o. female that presents for a same day visit  1. Vomiting: Symptoms started three days ago. She has vomited 4 times since this morning. She has an associated left sided abdominal pain, muscle aches and headaches. She started taking Keflex this morning, which has helped with her symptoms. She reports a recent history of watery diarrhea. She has associated rhinorrhea, cough and sneezing. She has a history of fever up to 101 degrees farenheit last week. She took some Zofran which helped with her symptoms. She has not received the flu shot this year.  ROS Per HPI  Social History  Substance Use Topics  . Smoking status: Former Smoker -- 14 years    Quit date: 09/20/2010  . Smokeless tobacco: Never Used  . Alcohol Use: No    Allergies  Allergen Reactions  . Latex Shortness Of Breath    Itching and burns skin  . Amoxicillin Itching and Swelling  . Naproxen Hives and Itching    Takes IBU without difficulty  . Tramadol Hives and Itching    Takes IBU without difficulty    Objective   BP 156/72 mmHg  Pulse 71  Temp(Src) 98.1 F (36.7 C) (Oral)  Ht 5\' 5"  (1.651 m)  Wt 163 lb 4.8 oz (74.072 kg)  BMI 27.17 kg/m2  General: Well appearing, no distress Gastrointestinal: Soft, non-tender, non-distended, normal bowel sounds  Assessment and Plan   1. Non-intractable vomiting with nausea, unspecified vomiting type - Initially treated for UTI. With nausea and vomiting, will treat as Pyelonephritis. Urine cultures pending. Zofran already available at home. Will write for another prescription of keflex and instructed patient to take Keflex 500mg  QID. Return precautions discussed

## 2015-07-21 ENCOUNTER — Encounter: Payer: Self-pay | Admitting: Family Medicine

## 2015-07-21 ENCOUNTER — Ambulatory Visit (INDEPENDENT_AMBULATORY_CARE_PROVIDER_SITE_OTHER): Payer: Medicaid Other | Admitting: Family Medicine

## 2015-07-21 VITALS — BP 138/70 | HR 78 | Temp 98.4°F | Ht 65.0 in | Wt 159.7 lb

## 2015-07-21 DIAGNOSIS — K219 Gastro-esophageal reflux disease without esophagitis: Secondary | ICD-10-CM | POA: Diagnosis not present

## 2015-07-21 DIAGNOSIS — A048 Other specified bacterial intestinal infections: Secondary | ICD-10-CM

## 2015-07-21 DIAGNOSIS — R1013 Epigastric pain: Secondary | ICD-10-CM | POA: Diagnosis not present

## 2015-07-21 DIAGNOSIS — B9681 Helicobacter pylori [H. pylori] as the cause of diseases classified elsewhere: Secondary | ICD-10-CM

## 2015-07-21 LAB — POCT H PYLORI SCREEN: H PYLORI SCREEN, POC: POSITIVE

## 2015-07-21 MED ORDER — METRONIDAZOLE 250 MG PO TABS: 250.0000 mg | ORAL_TABLET | Freq: Four times a day (QID) | ORAL | Status: DC

## 2015-07-21 MED ORDER — DOXYCYCLINE HYCLATE 100 MG PO TABS: 100.0000 mg | ORAL_TABLET | Freq: Two times a day (BID) | ORAL | Status: DC

## 2015-07-21 MED ORDER — OMEPRAZOLE 20 MG PO CPDR: 20.0000 mg | DELAYED_RELEASE_CAPSULE | Freq: Two times a day (BID) | ORAL | Status: DC

## 2015-07-21 MED ORDER — BISMUTH SUBSALICYLATE 525 MG/15ML PO SUSP: 15.0000 mL | Freq: Four times a day (QID) | ORAL | Status: DC

## 2015-07-21 NOTE — Patient Instructions (Addendum)
You may be having the start of an ulcer that is usually caused by a bacteria called H. Pylori.  The treatment is 2 antibiotics, and 2 anti acid medicines for 14 days.  Flagyl 250mg  4 times a day Doxycycline 100mg  2 times a day  Omeprazole 20mg  2 times a day (try to take about 20 minutes before a meal) bismuth subsalicylate 524mg  4 times a day (this can be from a solution or from a tablet -- both are over the counter)   Once the antibiotics are done, you can stop the bismuth. I would continue the omeprazole 2 times a day for 1 month, then decrease to once a day

## 2015-07-21 NOTE — Progress Notes (Signed)
   Subjective:    Patient ID: Susan Sanders, female    DOB: 1971-01-16, 45 y.o.   MRN: [id number]  HPI  Patient presents for Same Day Appointment  CC: stomach pain  # Stomach pain:  "feeling much more burping" -- this has been giong on since she was first seen   Have very significant feelings of heartburn, feeling like she is going to vomit but not   Right arm aching started today.   Keflex hasn't helped at all -- doesn't have any burning with urination currently.   Vaginal discharge present x 1 week. No new sexual partners; last intercourse was 2 months ago.   Subjective fever, measured 99, 101F orally  No diarrhea, constipation, no blood in stool  Social Hx: former smoker  Review of Systems   See HPI for ROS.   Past medical history, surgical, family, and social history reviewed and updated in the EMR as appropriate.  Objective:  BP 138/70 mmHg  Pulse 78  Temp(Src) 98.4 F (36.9 C) (Oral)  Ht 5\' 5"  (1.651 m)  Wt 159 lb 11.2 oz (72.439 kg)  BMI 26.58 kg/m2  SpO2 99% Vitals and nursing note reviewed  General: no apparent distress  CV: normal rate, regular rhythm, no murmurs, rubs or gallop. Resp: clear to auscultation bilaterally, normal effort Abdomen: there is moderate epigastric tenderness without rebound or guarding, mild LUQ tenderness. Normal bowel sounds  Assessment & Plan:   1. Epigastric discomfort History concerning for ulcerative disease, h pylori screen positive so will treat with quad therapy x 2 weeks, Low suspicion for cardiac etiology, there is a possibility of pelvic infection however she doesn't have recent sexual activity so would favor less concerning etiology like BV/yeast for her current vaginal discharge (of which she is going to be treated with flagyl so should cover BV). Follow up if worsening or not improving after finishing antibiotics.  - H.pylori screen, POC

## 2015-07-24 ENCOUNTER — Telehealth: Payer: Self-pay | Admitting: Family Medicine

## 2015-07-24 NOTE — Telephone Encounter (Signed)
Family Medicine After hours phone call  Patient is calling to confirm her prescription status at her [place]. She states that she has been having difficulty filling these medications and was sent to a different [facility] for reasons unknown to her. She is asking that I confirm that her prescriptions will be waiting for her at the [facility] she was initially informed that the prescriptions were sent to. She has no significant health concerns to relay at this time. Symptoms are identical to when she was seen on 07/21/15. I informed her that I would contact the [facility] to ensure that the proper orders have been placed and would be eventually prepared for her later today.  After discussing with [place] at appears as though she was initially sent to a separate [facility] to pick up her doxycycline. The [facility] which had received the initial prescription order has run out of doxycycline at this time. However they stated that they will have the other 3 medications available to her for pickup later today.  Elberta Leatherwood, MD,MS,  PGY2 07/24/2015 12:41 PM

## 2015-07-25 ENCOUNTER — Encounter: Payer: Self-pay | Admitting: Family Medicine

## 2015-07-25 ENCOUNTER — Ambulatory Visit (INDEPENDENT_AMBULATORY_CARE_PROVIDER_SITE_OTHER): Payer: Medicaid Other | Admitting: Family Medicine

## 2015-07-25 ENCOUNTER — Other Ambulatory Visit (HOSPITAL_COMMUNITY)
Admission: RE | Admit: 2015-07-25 | Discharge: 2015-07-25 | Disposition: A | Discharge status: Home / Self Care | Payer: Medicaid Other | Source: Ambulatory Visit | Attending: Family Medicine | Admitting: Family Medicine

## 2015-07-25 VITALS — BP 139/85 | HR 74 | Temp 98.4°F | Wt 159.0 lb

## 2015-07-25 DIAGNOSIS — N898 Other specified noninflammatory disorders of vagina: Secondary | ICD-10-CM

## 2015-07-25 DIAGNOSIS — R109 Unspecified abdominal pain: Secondary | ICD-10-CM

## 2015-07-25 DIAGNOSIS — A048 Other specified bacterial intestinal infections: Secondary | ICD-10-CM

## 2015-07-25 DIAGNOSIS — Z113 Encounter for screening for infections with a predominantly sexual mode of transmission: Secondary | ICD-10-CM | POA: Insufficient documentation

## 2015-07-25 DIAGNOSIS — K219 Gastro-esophageal reflux disease without esophagitis: Secondary | ICD-10-CM | POA: Diagnosis not present

## 2015-07-25 DIAGNOSIS — N730 Acute parametritis and pelvic cellulitis: Secondary | ICD-10-CM

## 2015-07-25 DIAGNOSIS — B9681 Helicobacter pylori [H. pylori] as the cause of diseases classified elsewhere: Secondary | ICD-10-CM | POA: Diagnosis not present

## 2015-07-25 LAB — POCT UA - MICROSCOPIC ONLY: TRICHOMONAS UA: POSITIVE

## 2015-07-25 LAB — POCT URINALYSIS DIPSTICK
BILIRUBIN UA: NEGATIVE
GLUCOSE UA: NEGATIVE
Nitrite, UA: NEGATIVE
PH UA: 5.5
Protein, UA: NEGATIVE
RBC UA: NEGATIVE
SPEC GRAV UA: 1.025
UROBILINOGEN UA: 0.2

## 2015-07-25 LAB — POCT WET PREP (WET MOUNT)
CLUE CELLS WET PREP WHIFF POC: POSITIVE
WBC, Wet Prep HPF POC: >20

## 2015-07-25 MED ORDER — DOXYCYCLINE HYCLATE 100 MG PO TABS: 100.0000 mg | ORAL_TABLET | Freq: Two times a day (BID) | ORAL | Status: DC

## 2015-07-25 MED ORDER — BISMUTH SUBSALICYLATE 525 MG/15ML PO SUSP: 15.0000 mL | Freq: Four times a day (QID) | ORAL | Status: DC

## 2015-07-25 MED ORDER — OMEPRAZOLE 20 MG PO CPDR: 20.0000 mg | DELAYED_RELEASE_CAPSULE | Freq: Two times a day (BID) | ORAL | Status: DC

## 2015-07-25 MED ORDER — METRONIDAZOLE 500 MG PO TABS: 500.0000 mg | ORAL_TABLET | Freq: Two times a day (BID) | ORAL | Status: DC

## 2015-07-25 MED ORDER — CEFTRIAXONE SODIUM 250 MG IJ SOLR
250.0000 mg | Freq: Once | INTRAMUSCULAR | Status: AC
  Administered 2015-07-25: 250 mg via INTRAMUSCULAR

## 2015-07-25 NOTE — Patient Instructions (Signed)
Thanks for coming in today.   You have a vaginal and uterine infection.   We have treated you with a shot of antibiotic.   It is important for you to get the other antibiotics from your pharmacy. You will be taking Flagyl (metronidazole) 500mg  Twice daily for 14 days, and Doxycycline 100mg  twice daily for 14 days.   These antibiotics will treat the bacteria in your stomach as well as the bacterial infection noted above.   This should help your symptoms.  If you develop worsening pelvic pain, nausea, vomiting, fever, chills, or your symptoms do not improve, then return for evaluation or go to the ED.   Thanks for letting us take care of you.   Sincerely, Paula Compton, MD Family Medicine -PGY 2  Pelvic Inflammatory Disease Pelvic inflammatory disease (PID) refers to an infection in some or all of the female organs. The infection can be in the uterus, ovaries, fallopian tubes, or the surrounding tissues in the pelvis. PID can cause abdominal or pelvic pain that comes on suddenly (acute pelvic pain). PID is a serious infection because it can lead to lasting (chronic) pelvic pain or the inability to have children (infertility). CAUSES This condition is most often caused by an infection that is spread during sexual contact. However, the infection can also be caused by the normal bacteria that are found in the vaginal tissues if these bacteria travel upward into the reproductive organs. PID can also occur following:  The birth of a baby.  A miscarriage.  An abortion.  Major pelvic surgery.  The use of an intrauterine device (IUD).  A sexual assault. RISK FACTORS This condition is more likely to develop in women who:  Are younger than 47 years of age.  Are sexually active at [facility] age.  Use nonbarrier contraception.  Have multiple sexual partners.  Have sex with someone who has symptoms of an STD (sexually transmitted disease).  Use oral contraception. At times, certain  behaviors can also increase the possibility of getting PID, such as:  Using a vaginal douche.  Having an IUD in place. SYMPTOMS Symptoms of this condition include:  Abdominal or pelvic pain.  Fever.  Chills.  Abnormal vaginal discharge.  Abnormal uterine bleeding.  Unusual pain shortly after the end of a menstrual period.  Painful urination.  Pain with sexual intercourse.  Nausea and vomiting. DIAGNOSIS To diagnose this condition, your health care provider will do a physical exam and take your medical history. A pelvic exam typically reveals great tenderness in the uterus and the surrounding pelvic tissues. You may also have tests, such as:  Lab tests, including a pregnancy test, blood tests, and urine test.  Culture tests of the vagina and cervix to check for an STD.  Ultrasound.  A laparoscopic procedure to look inside the pelvis.  Examining vaginal secretions under a microscope. TREATMENT Treatment for this condition may involve one or more approaches.  Antibiotic medicines may be prescribed to be taken by mouth.  Sexual partners may need to be treated if the infection is caused by an STD.  For more severe cases, hospitalization may be needed to give antibiotics directly into a vein through an IV tube.  Surgery may be needed if other treatments do not help, but this is rare. It may take weeks until you are completely well. If you are diagnosed with PID, you should also be checked for human immunodeficiency virus (HIV). Your health care provider may test you for infection again 3 months  after treatment. You should not have unprotected sex. HOME CARE INSTRUCTIONS  Take over-the-counter and prescription medicines only as told by your health care provider.  If you were prescribed an antibiotic medicine, take it as told by your health care provider. Do not stop taking the antibiotic even if you start to feel better.  Do not have sexual intercourse until treatment  is completed or as told by your health care provider. If PID is confirmed, your recent sexual partners will need treatment, especially if you had unprotected sex.  Keep all follow-up visits as told by your health care provider. This is important. SEEK MEDICAL CARE IF:  You have increased or abnormal vaginal discharge.  Your pain does not improve.  You vomit.  You have a fever.  You cannot tolerate your medicines.  Your partner has an STD.  You have pain when you urinate. SEEK IMMEDIATE MEDICAL CARE IF:  You have increased abdominal or pelvic pain.  You have chills.  Your symptoms are not better in 72 hours even with treatment.   This information is not intended to replace advice given to you by your health care provider. Make sure you discuss any questions you have with your health care provider.   Document Released: 04/30/2005 Document Revised: 01/19/2015 Document Reviewed: 06/07/2014 Elsevier Interactive Patient Education [employer].

## 2015-07-25 NOTE — Progress Notes (Signed)
Patient ID: Susan Sanders, female   DOB: 07/17/1970, 45 y.o.   MRN: [id number]   [facility] Aquilla Hacker, MD Phone: [phone]  Subjective:   # Left Side Abdominal Pain / Vaginal Discharge - here for evaluation of left sided abdominal pain / vaginal discharge.  - Has been having this for about 2 weeks now.  - Was seen previously with primary symptom being epigastric pain / nausea / vomiting.  - She had H.Pylori test which was positive, and treatment sent in including flagyl / doxycycline which she was unable to pick up due to pharmacy mix up.  - Her nausea / vomiting have resolved, but she has persistent dull left sided abdominal pain with increasing vaginal discharge.  - she denies recent risky sexual encounters. Upreg at her recent office visit was negative.  - She denies dysuria, hematuria, polyuria, urgency, or suprapubic pain.  - Her vaginal discharge is whitish.  - She says she has not had a history of kidney stones, and her pain is dull / achy and constant.  - She does not smoke.   All relevant systems were reviewed and were negative unless otherwise noted in the HPI  Past Medical History Reviewed problem list.  Medications- reviewed and updated Current Outpatient Prescriptions  Medication Sig Dispense Refill  . amLODipine (NORVASC) 10 MG tablet Take 1 tablet (10 mg total) by mouth daily. 30 tablet 0  . Bismuth Subsalicylate [id number] SUSP Take 15 mLs (525 mg total) by mouth 4 (four) times daily. 840 mL 0  . doxycycline (VIBRA-TABS) 100 MG tablet Take 1 tablet (100 mg total) by mouth 2 (two) times daily. 28 tablet 0  . fluticasone (FLONASE) 50 MCG/ACT nasal spray Place 2 sprays into both nostrils daily. 16 g 0  . ibuprofen (ADVIL,MOTRIN) 800 MG tablet Take 1 tablet (800 mg total) by mouth 3 (three) times daily. 21 tablet 0  . omeprazole (PRILOSEC) 20 MG capsule Take 1 capsule (20 mg total) by mouth 2 (two) times daily before a meal. 60 capsule 1  .  gabapentin (NEURONTIN) 400 MG capsule Take 1 capsule (400 mg total) by mouth 3 (three) times daily. Start with 1 capsule at night, then increase by 1 cap daily until you reach 3 caps 3 times a day 90 capsule 2  . metroNIDAZOLE (FLAGYL) 500 MG tablet Take 1 tablet (500 mg total) by mouth 2 (two) times daily. 28 tablet 0  . ondansetron (ZOFRAN) 4 MG tablet Take 1 tablet (4 mg total) by mouth every 6 (six) hours. (Patient not taking: Reported on 07/25/2015) 12 tablet 0   No current facility-administered medications for this visit.   Chief complaint-noted No additions to family history Social history- patient is a non smoker  Objective: BP 139/85 mmHg  Pulse 74  Temp(Src) 98.4 F (36.9 C) (Oral)  Wt 159 lb (72.122 kg) Gen: NAD, alert, cooperative with exam HEENT: NCAT, EOMI, PERRL Neck: FROM, supple CV: RRR, good S1/S2, no murmur Resp: CTABL, no wheezes, non-labored Abd: S, ND, + mild TTP over LLQ, BS present, no guarding or organomegaly G/U: Speculum exam without erythema of vaginal mucosa, normal cervix, + white discharge / pooling on exam, No CMT, but LLQ TTP with bimanual exam.  Ext: No edema, warm, normal tone, moves UE/LE spontaneously Neuro: Alert and oriented, No gross deficits Skin: no rashes no lesions  Assessment/Plan:  # PID / Trichomonas  - pt. With trichomonas on wet prep / urine micro. LLQ pain =  PID. No other systemic signs / symptoms at this time. Recent negative Upreg on 3/2.  - Treat with IM rocephin 250mg   - Doxycycline 100mg  BID x 14 days.  - Flagyl 500mg  BID x 14 days.  - Needs test of cure at follow up visit in 1-2 months.  - Warning regarding possible abscess formation and signs / symptoms to return for care were given.  - Pt. Acknowledged.  - f/u with PCP in the future.   # H. Pylori Infection - had mixup with prescriptions from last visit.  - Doxycycline, Flagyl sent in as above.  - Resent omeprazole, Bismuth as well.  - pt. To f/u with pcp.

## 2015-07-26 LAB — CERVICOVAGINAL ANCILLARY ONLY
CHLAMYDIA, DNA PROBE: NEGATIVE
NEISSERIA GONORRHEA: NEGATIVE

## 2015-08-24 ENCOUNTER — Ambulatory Visit (HOSPITAL_COMMUNITY)
Admission: EM | Admit: 2015-08-24 | Discharge: 2015-08-24 | Disposition: A | Discharge status: Home / Self Care | Payer: Medicaid Other | Attending: Family Medicine | Admitting: Family Medicine

## 2015-08-24 ENCOUNTER — Encounter (HOSPITAL_COMMUNITY): Payer: Self-pay | Admitting: *Deleted

## 2015-08-24 DIAGNOSIS — A0811 Acute gastroenteropathy due to Norwalk agent: Secondary | ICD-10-CM

## 2015-08-24 MED ORDER — ONDANSETRON HCL 4 MG/2ML IJ SOLN
4.0000 mg | Freq: Once | INTRAMUSCULAR | Status: AC
  Administered 2015-08-24: 4 mg via INTRAVENOUS

## 2015-08-24 MED ORDER — ONDANSETRON HCL 4 MG/2ML IJ SOLN
INTRAMUSCULAR | Status: AC
  Filled 2015-08-24: qty 2

## 2015-08-24 MED ORDER — ONDANSETRON HCL 4 MG PO TABS: 4.0000 mg | ORAL_TABLET | Freq: Four times a day (QID) | ORAL | Status: DC

## 2015-08-24 MED ORDER — ONDANSETRON HCL 4 MG/2ML IJ SOLN: 4.0000 mg | Freq: Once | INTRAMUSCULAR | Status: DC

## 2015-08-24 MED ORDER — SODIUM CHLORIDE 0.9 % IV BOLUS (SEPSIS)
1000.0000 mL | Freq: Once | INTRAVENOUS | Status: AC
  Administered 2015-08-24: 1000 mL via INTRAVENOUS

## 2015-08-24 NOTE — ED Notes (Signed)
Pt    Reports      Body  Aches         Nausea    And  Vomiting     Diarrhea          Symptoms  Began  Yesterday

## 2015-08-24 NOTE — Discharge Instructions (Signed)
Clear liquid , bland diet tonight as tolerated, advance on thurs as improved, use medicine as needed, return or see your doctor if any problems. °

## 2015-08-24 NOTE — ED Provider Notes (Signed)
CSN: [id number]     Arrival date & time 08/24/15  1700 History   First MD Initiated Contact with Patient 08/24/15 1848     Chief Complaint  Patient presents with  . Emesis   (Consider location/radiation/quality/duration/timing/severity/associated sxs/prior Treatment) Patient is a 45 y.o. female presenting with vomiting. The history is provided by the patient.  Emesis Severity:  Moderate Duration:  1 day Quality:  Stomach contents Progression:  Unchanged Chronicity:  New Recent urination:  Normal Relieved by:  None tried Worsened by:  Nothing tried Ineffective treatments:  None tried Associated symptoms: chills, diarrhea and myalgias   Associated symptoms: no abdominal pain, no cough and no fever   Risk factors: sick contacts     Past Medical History  Diagnosis Date  . Scoliosis   . Complication of anesthesia     difficult waking up "  . Chest pain   . Depression   . Chronic back pain greater than 3 months duration   . Eczema   . Cellulitis   . Anxiety   . Depression   . Asthma    Past Surgical History  Procedure Laterality Date  . Tubal ligation     History reviewed. No pertinent family history. Social History  Substance Use Topics  . Smoking status: Former Smoker -- 14 years    Quit date: 09/20/2010  . Smokeless tobacco: Never Used  . Alcohol Use: No   OB History    Gravida Para Term Preterm AB TAB SAB Ectopic Multiple Living   7 6 4 2 1   1  6      Review of Systems  Constitutional: Positive for chills. Negative for fever.  Gastrointestinal: Positive for nausea, vomiting and diarrhea. Negative for abdominal pain.  Musculoskeletal: Positive for myalgias.  Skin: Negative.   All other systems reviewed and are negative.   Allergies  Latex; Amoxicillin; Naproxen; and Tramadol  Home Medications   Prior to Admission medications   Medication Sig Start Date End Date Taking? Authorizing Provider  amLODipine (NORVASC) 10 MG tablet Take 1 tablet (10 mg  total) by mouth daily. 06/23/15   Konrad Felix, PA  Bismuth Subsalicylate [id number] SUSP Take 15 mLs (525 mg total) by mouth 4 (four) times daily. 07/25/15   Aquilla Hacker, MD  doxycycline (VIBRA-TABS) 100 MG tablet Take 1 tablet (100 mg total) by mouth 2 (two) times daily. 07/25/15   York Ram Melancon, MD  fluticasone (FLONASE) 50 MCG/ACT nasal spray Place 2 sprays into both nostrils daily. 10/10/14   Melony Overly, MD  gabapentin (NEURONTIN) 400 MG capsule Take 1 capsule (400 mg total) by mouth 3 (three) times daily. Start with 1 capsule at night, then increase by 1 cap daily until you reach 3 caps 3 times a day 06/18/14   Renee A Kuneff, DO  ibuprofen (ADVIL,MOTRIN) 800 MG tablet Take 1 tablet (800 mg total) by mouth 3 (three) times daily. 03/28/14   Carlisle Cater, PA-C  metroNIDAZOLE (FLAGYL) 500 MG tablet Take 1 tablet (500 mg total) by mouth 2 (two) times daily. 07/25/15   Aquilla Hacker, MD  omeprazole (PRILOSEC) 20 MG capsule Take 1 capsule (20 mg total) by mouth 2 (two) times daily before a meal. 07/25/15   York Ram Melancon, MD  ondansetron (ZOFRAN) 4 MG tablet Take 1 tablet (4 mg total) by mouth every 6 (six) hours. Prn n/v. 08/24/15   Billy Fischer, MD   Meds Ordered and Administered this Visit   Medications  sodium chloride 0.9 % bolus 1,000 mL (1,000 mLs Intravenous Given 08/24/15 1909)  ondansetron (ZOFRAN) injection 4 mg (4 mg Intravenous Given 08/24/15 1913)    BP 139/79 mmHg  Pulse 70  Temp(Src) 98.2 F (36.8 C) (Oral)  Resp 16  SpO2 100%  LMP 08/01/2015 No data found.   Physical Exam  Constitutional: She is oriented to person, place, and time. She appears well-developed and well-nourished. No distress.  HENT:  Mouth/Throat: Oropharynx is clear and moist.  Neck: Normal range of motion. Neck supple.  Abdominal: Soft. Bowel sounds are normal. She exhibits no distension and no mass. There is no tenderness. There is no rebound and no guarding.  Lymphadenopathy:    She has  no cervical adenopathy.  Neurological: She is alert and oriented to person, place, and time.  Skin: Skin is warm and dry.  Nursing note and vitals reviewed.   ED Course  Procedures (including critical care time)  Labs Review Labs Reviewed - No data to display  Imaging Review No results found.   Visual Acuity Review  Right Eye Distance:   Left Eye Distance:   Bilateral Distance:    Right Eye Near:   Left Eye Near:    Bilateral Near:         MDM   1. Gastroenteritis due to norovirus    Sx improved with ivf and meds    Billy Fischer, MD 08/24/15 2007

## 2015-08-25 ENCOUNTER — Ambulatory Visit (INDEPENDENT_AMBULATORY_CARE_PROVIDER_SITE_OTHER): Payer: Medicaid Other | Admitting: Family Medicine

## 2015-08-25 ENCOUNTER — Encounter: Payer: Self-pay | Admitting: Family Medicine

## 2015-08-25 VITALS — BP 133/82 | HR 83 | Temp 98.3°F | Wt 167.7 lb

## 2015-08-25 DIAGNOSIS — A599 Trichomoniasis, unspecified: Secondary | ICD-10-CM

## 2015-08-25 DIAGNOSIS — R0989 Other specified symptoms and signs involving the circulatory and respiratory systems: Secondary | ICD-10-CM | POA: Diagnosis not present

## 2015-08-25 DIAGNOSIS — R109 Unspecified abdominal pain: Secondary | ICD-10-CM

## 2015-08-25 LAB — POCT URINALYSIS DIPSTICK
BILIRUBIN UA: NEGATIVE
Glucose, UA: NEGATIVE
KETONES UA: NEGATIVE
Leukocytes, UA: NEGATIVE
NITRITE UA: NEGATIVE
PH UA: 5.5
Protein, UA: NEGATIVE
Spec Grav, UA: 1.025
Urobilinogen, UA: 0.2

## 2015-08-25 LAB — POCT UA - MICROSCOPIC ONLY: TRICHOMONAS UA: POSITIVE

## 2015-08-25 MED ORDER — METRONIDAZOLE 500 MG PO TABS
2000.0000 mg | ORAL_TABLET | Freq: Once | ORAL | Status: AC
  Administered 2015-08-25: 2000 mg via ORAL

## 2015-08-25 NOTE — Progress Notes (Signed)
    Subjective   Susan Sanders is a 45 y.o. female that presents for a same day visit  1. Bilateral flank achiness: Symptoms started two days ago. She has associated lightheadedness, fatigue, chills, hot flashes and nausea with vomiting. She reports a fever yesterday to around 101 degrees farenheit. She was previously having diarrhea which has improved. No associated rhinorrhea, sneezing, coughing or dysuria. She has been able to keep fluids down.   ROS Per HPI  Social History  Substance Use Topics  . Smoking status: Former Smoker -- 14 years    Quit date: 09/20/2010  . Smokeless tobacco: Never Used  . Alcohol Use: No    Allergies  Allergen Reactions  . Latex Shortness Of Breath    Itching and burns skin  . Amoxicillin Itching and Swelling  . Naproxen Hives and Itching    Takes IBU without difficulty  . Tramadol Hives and Itching    Takes IBU without difficulty    Objective   BP 133/82 mmHg  Pulse 83  Temp(Src) 98.3 F (36.8 C) (Oral)  Wt 167 lb 11.2 oz (76.068 kg)  LMP 08/01/2015  General: Well appearing, no distress HEENT:   Head:  Normocephalic  Eyes: Pupils equal and reactive to light/accomodation. Extraocular movements intact bilaterally.  Ears: Tympanic membranes normal bilaterally.  Nose/Throat: Nares patent bilaterally. Oropharnx clear and moist.  Neck: No cervical adenopathy bilaterally Cardiovascular: Regular rate and rhythm. Normal S1 and S2. No heart murmurs present. No extra heart sounds Gastrointestinal: Soft, non-tender, non-distended, no guarding, no rebound, no masses felt. Bruit heard  Assessment and Plan   1. Flank pain Likely musculoskeletal. Tylenol as needed. Will likely improve with resolution of viral illness - POCT urinalysis dipstick - POCT UA - Microscopic Only  2. Abdominal bruit Possible aortic aneurysm. Patient with a history of smoking but has quit. - US Aorta; Future  3. Trichomonal infection Incidentally found on urinalysis.  Patient counseled on disease. She will inform her partner. Metronidazole 2000mg  in office. - metroNIDAZOLE (FLAGYL) tablet 2,000 mg; Take 4 tablets (2,000 mg total) by mouth once.

## 2015-08-25 NOTE — Patient Instructions (Addendum)
Thank you for coming to see me today. It was a pleasure. Today we talked about:   Flank pain: I checked your urine and it looks like you do not have a urinary tract infection. Your flank pain is likely related to the muscles in your back since you are recovering from being ill. Incidentally, I heard a murmur in your stomach. I will get an ultrasound to check this out.  Please make an appointment to see Dr. Lamar Benes for follow-up  If you have any questions or concerns, please do not hesitate to call the office at [phone].  Sincerely,  Cordelia Poche, MD

## 2015-08-29 ENCOUNTER — Emergency Department (HOSPITAL_COMMUNITY)
Admission: EM | Admit: 2015-08-29 | Discharge: 2015-08-29 | Disposition: A | Discharge status: Home / Self Care | Payer: Medicaid Other | Attending: Emergency Medicine | Admitting: Emergency Medicine

## 2015-08-29 ENCOUNTER — Encounter (HOSPITAL_COMMUNITY): Payer: Self-pay | Admitting: *Deleted

## 2015-08-29 ENCOUNTER — Telehealth: Payer: Self-pay | Admitting: Family Medicine

## 2015-08-29 DIAGNOSIS — Z91040 Latex allergy status: Secondary | ICD-10-CM | POA: Diagnosis not present

## 2015-08-29 DIAGNOSIS — F419 Anxiety disorder, unspecified: Secondary | ICD-10-CM | POA: Insufficient documentation

## 2015-08-29 DIAGNOSIS — Z202 Contact with and (suspected) exposure to infections with a predominantly sexual mode of transmission: Secondary | ICD-10-CM | POA: Diagnosis not present

## 2015-08-29 DIAGNOSIS — N760 Acute vaginitis: Secondary | ICD-10-CM | POA: Diagnosis not present

## 2015-08-29 DIAGNOSIS — F329 Major depressive disorder, single episode, unspecified: Secondary | ICD-10-CM | POA: Diagnosis not present

## 2015-08-29 DIAGNOSIS — G8929 Other chronic pain: Secondary | ICD-10-CM | POA: Insufficient documentation

## 2015-08-29 DIAGNOSIS — Z7951 Long term (current) use of inhaled steroids: Secondary | ICD-10-CM | POA: Diagnosis not present

## 2015-08-29 DIAGNOSIS — R109 Unspecified abdominal pain: Secondary | ICD-10-CM | POA: Diagnosis present

## 2015-08-29 DIAGNOSIS — B9689 Other specified bacterial agents as the cause of diseases classified elsewhere: Secondary | ICD-10-CM

## 2015-08-29 DIAGNOSIS — Z872 Personal history of diseases of the skin and subcutaneous tissue: Secondary | ICD-10-CM | POA: Diagnosis not present

## 2015-08-29 DIAGNOSIS — J45909 Unspecified asthma, uncomplicated: Secondary | ICD-10-CM | POA: Diagnosis not present

## 2015-08-29 DIAGNOSIS — Z87891 Personal history of nicotine dependence: Secondary | ICD-10-CM | POA: Insufficient documentation

## 2015-08-29 DIAGNOSIS — M419 Scoliosis, unspecified: Secondary | ICD-10-CM | POA: Insufficient documentation

## 2015-08-29 DIAGNOSIS — Z79899 Other long term (current) drug therapy: Secondary | ICD-10-CM | POA: Insufficient documentation

## 2015-08-29 DIAGNOSIS — Z880 Allergy status to penicillin: Secondary | ICD-10-CM | POA: Diagnosis not present

## 2015-08-29 DIAGNOSIS — R102 Pelvic and perineal pain: Secondary | ICD-10-CM

## 2015-08-29 LAB — URINE MICROSCOPIC-ADD ON: Bacteria, UA: NONE SEEN

## 2015-08-29 LAB — GC/CHLAMYDIA PROBE AMP (~~LOC~~) NOT AT ARMC
Chlamydia: NEGATIVE
NEISSERIA GONORRHEA: NEGATIVE

## 2015-08-29 LAB — URINALYSIS, ROUTINE W REFLEX MICROSCOPIC
Bilirubin Urine: NEGATIVE
Glucose, UA: NEGATIVE mg/dL
KETONES UR: NEGATIVE mg/dL
LEUKOCYTES UA: NEGATIVE
NITRITE: NEGATIVE
PROTEIN: NEGATIVE mg/dL
Specific Gravity, Urine: 1.018 (ref 1.005–1.030)
pH: 6 (ref 5.0–8.0)

## 2015-08-29 LAB — WET PREP, GENITAL
Sperm: NONE SEEN
TRICH WET PREP: NONE SEEN
YEAST WET PREP: NONE SEEN

## 2015-08-29 LAB — CBC
HEMATOCRIT: 35.2 % — AB (ref 36.0–46.0)
HEMOGLOBIN: 11.6 g/dL — AB (ref 12.0–15.0)
MCH: 26.3 pg (ref 26.0–34.0)
MCHC: 33 g/dL (ref 30.0–36.0)
MCV: 79.8 fL (ref 78.0–100.0)
Platelets: 403 10*3/uL — ABNORMAL HIGH (ref 150–400)
RBC: 4.41 MIL/uL (ref 3.87–5.11)
RDW: 15 % (ref 11.5–15.5)
WBC: 11.4 10*3/uL — AB (ref 4.0–10.5)

## 2015-08-29 LAB — BASIC METABOLIC PANEL
ANION GAP: 13 (ref 5–15)
BUN: 9 mg/dL (ref 6–20)
CALCIUM: 9.2 mg/dL (ref 8.9–10.3)
CO2: 19 mmol/L — AB (ref 22–32)
CREATININE: 0.87 mg/dL (ref 0.44–1.00)
Chloride: 105 mmol/L (ref 101–111)
Glucose, Bld: 108 mg/dL — ABNORMAL HIGH (ref 65–99)
Potassium: 3.5 mmol/L (ref 3.5–5.1)
SODIUM: 137 mmol/L (ref 135–145)

## 2015-08-29 MED ORDER — LIDOCAINE HCL (PF) 1 % IJ SOLN
1.2000 mL | Freq: Once | INTRAMUSCULAR | Status: AC
  Administered 2015-08-29: 1.2 mL
  Filled 2015-08-29: qty 5

## 2015-08-29 MED ORDER — KETOROLAC TROMETHAMINE 30 MG/ML IJ SOLN
30.0000 mg | Freq: Once | INTRAMUSCULAR | Status: AC
  Administered 2015-08-29: 30 mg via INTRAVENOUS
  Filled 2015-08-29: qty 1

## 2015-08-29 MED ORDER — CEFTRIAXONE SODIUM 250 MG IJ SOLR
250.0000 mg | Freq: Once | INTRAMUSCULAR | Status: AC
  Administered 2015-08-29: 250 mg via INTRAMUSCULAR
  Filled 2015-08-29: qty 250

## 2015-08-29 MED ORDER — ACETAMINOPHEN 325 MG PO TABS
650.0000 mg | ORAL_TABLET | Freq: Once | ORAL | Status: AC
  Administered 2015-08-29: 650 mg via ORAL
  Filled 2015-08-29: qty 2

## 2015-08-29 MED ORDER — METRONIDAZOLE 500 MG PO TABS: 500.0000 mg | ORAL_TABLET | Freq: Two times a day (BID) | ORAL | Status: DC

## 2015-08-29 MED ORDER — AZITHROMYCIN 250 MG PO TABS
1000.0000 mg | ORAL_TABLET | Freq: Once | ORAL | Status: AC
  Administered 2015-08-29: 1000 mg via ORAL
  Filled 2015-08-29: qty 4

## 2015-08-29 NOTE — ED Notes (Signed)
Placed pt on bed pan.

## 2015-08-29 NOTE — Discharge Instructions (Signed)
Please read and follow all provided instructions.  Your diagnoses today include:  1. Pelvic pain in female   2. Possible exposure to STD   3. Bacterial vaginosis    Tests performed today include:  Test for gonorrhea and chlamydia. You will be notified by telephone if you have a positive result.  Vital signs. See below for your results today.   Medications:  You were treated for chlamydia (1 gram azithromycin pills) and gonorrhea (250mg  rocephin shot).  Home care instructions:  Read educational materials contained in this packet and follow any instructions provided.   You should tell your partners about your infection and avoid having sex for one week to allow time for the medicine to work.  Follow-up instructions: You should follow-up with the [facility] STD clinic to be tested for HIV, syphilis, and hepatitis -- all of which can be transmitted by sexual contact. We do not routinely screen for these in the Emergency Department.  STD Testing:  [place]  [address] [place], phone [phone] or [phone]    Monday - Friday, call for an appointment  [place]  [address], [place], phone [phone] or [phone]   Monday - Friday, call for an appointment  Return instructions:   Please return to the Emergency Department if you experience worsening symptoms.   Please return if you have any other emergent concerns.  Additional Information:  Your vital signs today were: BP 162/86 mmHg   Pulse 77   Temp(Src) 98.4 F (36.9 C) (Oral)   Resp 16   Wt 75.751 kg   SpO2 100%   LMP 08/28/2015 If your blood pressure (BP) was elevated above 135/85 this visit, please have this repeated by your doctor within one month. --------------

## 2015-08-29 NOTE — Telephone Encounter (Signed)
After Hours Telephone Call  Patient reports a lot of pain in side and stomach.  She reports that it started tonight shortly before onset of menses.  Denies previous pain like this.  Patient is moaning throughout telephone call.  Advised patient that she needs to be evaluated and she should come to ED.  She then hangs up the phone promptly.  Virginia Crews, MD, MPH PGY-2,  [facility] 08/29/2015 5:26 AM

## 2015-08-29 NOTE — ED Notes (Signed)
Patient presents stating she got sick on Thursday with left side pain that radiates across the front of her abd to the right side.  Denies urinary symptoms.  Went to family practice and was told she had air in her stomach and they schedule an ultrasound for 4/20.  Patient moaning out loud +nausea

## 2015-08-29 NOTE — ED Provider Notes (Signed)
CSN: [id number]     Arrival date & time 08/29/15  0547 History   First MD Initiated Contact with Patient 08/29/15 0559     Chief Complaint  Patient presents with  . Abdominal Pain   (Consider location/radiation/quality/duration/timing/severity/associated sxs/prior Treatment) HPI 45 y.o. female presents to the Emergency Department today complaining of left flank pain since Thursday. Notes seeing PCP on 08-25-15 and diagnosed with Trichomoniasis. Given Flagyl and DCed home with scheduled [place] on 4-20 due to possible Aortic Aneurysm. Pt ntoesp ain is 10/10 and originates on left flank with radiation into groin. No dysuria. No fever. No N/V/D. No CP/SOB. Menses started yesterday and pt states the pain is out of proportion to normal. She is sexually active. Has had BM this morning. No other symptoms noted.      Past Medical History  Diagnosis Date  . Scoliosis   . Complication of anesthesia     difficult waking up "  . Chest pain   . Depression   . Chronic back pain greater than 3 months duration   . Eczema   . Cellulitis   . Anxiety   . Depression   . Asthma    Past Surgical History  Procedure Laterality Date  . Tubal ligation     No family history on file. Social History  Substance Use Topics  . Smoking status: Former Smoker -- 14 years    Quit date: 09/20/2010  . Smokeless tobacco: Never Used  . Alcohol Use: No   OB History    Gravida Para Term Preterm AB TAB SAB Ectopic Multiple Living   7 6 4 2 1   1  6      Review of Systems ROS reviewed and all are negative for acute change except as noted in the HPI.  Allergies  Latex; Amoxicillin; Naproxen; and Tramadol  Home Medications   Prior to Admission medications   Medication Sig Start Date End Date Taking? Authorizing Provider  amLODipine (NORVASC) 10 MG tablet Take 1 tablet (10 mg total) by mouth daily. 06/23/15   Konrad Felix, PA  Bismuth Subsalicylate [id number] SUSP Take 15 mLs (525 mg total) by mouth 4 (four) times  daily. 07/25/15   Aquilla Hacker, MD  doxycycline (VIBRA-TABS) 100 MG tablet Take 1 tablet (100 mg total) by mouth 2 (two) times daily. 07/25/15   York Ram Melancon, MD  fluticasone (FLONASE) 50 MCG/ACT nasal spray Place 2 sprays into both nostrils daily. 10/10/14   Melony Overly, MD  gabapentin (NEURONTIN) 400 MG capsule Take 1 capsule (400 mg total) by mouth 3 (three) times daily. Start with 1 capsule at night, then increase by 1 cap daily until you reach 3 caps 3 times a day 06/18/14   Renee A Kuneff, DO  ibuprofen (ADVIL,MOTRIN) 800 MG tablet Take 1 tablet (800 mg total) by mouth 3 (three) times daily. 03/28/14   Carlisle Cater, PA-C  metroNIDAZOLE (FLAGYL) 500 MG tablet Take 1 tablet (500 mg total) by mouth 2 (two) times daily. 07/25/15   Aquilla Hacker, MD  omeprazole (PRILOSEC) 20 MG capsule Take 1 capsule (20 mg total) by mouth 2 (two) times daily before a meal. 07/25/15   York Ram Melancon, MD  ondansetron (ZOFRAN) 4 MG tablet Take 1 tablet (4 mg total) by mouth every 6 (six) hours. Prn n/v. 08/24/15   Billy Fischer, MD   BP 128/88 mmHg  Pulse 81  Temp(Src) 98.4 F (36.9 C) (Oral)  Resp 17  Wt 75.751  kg  SpO2 100%  LMP 08/28/2015   Physical Exam  Constitutional: She is oriented to person, place, and time. She appears well-developed and well-nourished.  HENT:  Head: Normocephalic and atraumatic.  Eyes: EOM are normal. Pupils are equal, round, and reactive to light.  Neck: Normal range of motion. Neck supple. No tracheal deviation present.  Cardiovascular: Normal rate, regular rhythm, normal heart sounds and intact distal pulses.   No murmur heard. Pulmonary/Chest: Breath sounds normal. No respiratory distress. She has no wheezes. She has no rales. She exhibits no tenderness.  Abdominal: Soft. Normal appearance and bowel sounds are normal. There is tenderness in the suprapubic area and left lower quadrant. There is no rigidity, no rebound, no guarding, no CVA tenderness, no tenderness at  McBurney's point and negative Murphy's sign.  Musculoskeletal: Normal range of motion.  Neurological: She is alert and oriented to person, place, and time.  Skin: Skin is warm and dry.  Psychiatric: She has a normal mood and affect. Her behavior is normal. Thought content normal.  Nursing note and vitals reviewed.  Exam performed by Ozella Rocks,  exam chaperoned Date: 08/29/2015 Pelvic exam: normal external genitalia without evidence of trauma. VULVA: normal appearing vulva with no masses, tenderness or lesion. VAGINA: normal appearing vagina with normal color and discharge, no lesions. CERVIX: normal appearing cervix without lesions, cervical motion tenderness absent, cervical os closed with out purulent discharge; vaginal discharge - bloody, Wet prep and DNA probe for chlamydia and GC obtained.   ADNEXA: normal adnexa in size, nontender and no masses UTERUS: uterus is normal size, shape, consistency and nontender.    ED Course  Procedures (including critical care time) Labs Review Labs Reviewed  WET PREP, GENITAL - Abnormal; Notable for the following:    Clue Cells Wet Prep HPF POC PRESENT (*)    WBC, Wet Prep HPF POC PRESENT (*)    All other components within normal limits  URINALYSIS, ROUTINE W REFLEX MICROSCOPIC (NOT AT [facility]) - Abnormal; Notable for the following:    Hgb urine dipstick MODERATE (*)    All other components within normal limits  CBC - Abnormal; Notable for the following:    WBC 11.4 (*)    Hemoglobin 11.6 (*)    HCT 35.2 (*)    Platelets 403 (*)    All other components within normal limits  BASIC METABOLIC PANEL - Abnormal; Notable for the following:    CO2 19 (*)    Glucose, Bld 108 (*)    All other components within normal limits  URINE MICROSCOPIC-ADD ON - Abnormal; Notable for the following:    Squamous Epithelial / LPF 6-30 (*)    All other components within normal limits  GC/CHLAMYDIA PROBE AMP ([place]) NOT AT [facility]   Imaging Review No  results found. I have personally reviewed and evaluated these images and lab results as part of my medical decision-making.   EKG Interpretation None      MDM  I have reviewed and evaluated the relevant laboratory values. I have reviewed and evaluated the relevant imaging studies. I have reviewed the relevant previous healthcare records. I obtained HPI from historian. Patient discussed with supervising physician  ED Course:  Assessment: Patient is a 44yF presents with abdominal pain since Thursday. Treated for trich by PCP. LLQ On exam, nontoxic, nonseptic appearing, in no apparent distress. Patient's pain and other symptoms adequately managed in emergency department.  Labs and vitals reviewed.  Patient does not meet the SIRS or  Sepsis criteria.  On repeat exam patient does not have a surgical abdomen and there are no peritoneal signs.  No indication of appendicitis, bowel obstruction, bowel perforation, cholecystitis, diverticulitis, PID or ectopic pregnancy. Pelvic exam with blood noted. Pt on menses. Otherwise unremarkable. Pain most likely STD related. Given Flagyl at [facility], but not treated for [facility]. Patient partner has multiple sexual partners. Given Azithro/Rocephin in ED. Will treat for BV with Flagyl. Patient discharged home with symptomatic treatment and given strict instructions for follow-up with their primary care physician.  I have also discussed reasons to return immediately to the ER.  Patient expresses understanding and agrees with plan.  Disposition/Plan:  DC Home Additional Verbal discharge instructions given and discussed with patient.  Pt Instructed to f/u with PCP in the next 48 hours for evaluation and treatment of symptoms. Return precautions given Pt acknowledges and agrees with plan  Supervising Physician Jola Schmidt, MD   Final diagnoses:  Possible exposure to STD  Pelvic pain in female  Bacterial vaginosis        Shary Decamp, PA-C 08/29/15 [place], MD 08/29/15 [phone]

## 2015-09-01 ENCOUNTER — Encounter: Payer: Self-pay | Admitting: Family Medicine

## 2015-09-01 ENCOUNTER — Ambulatory Visit (HOSPITAL_COMMUNITY)
Admission: RE | Admit: 2015-09-01 | Discharge: 2015-09-01 | Disposition: A | Discharge status: Home / Self Care | Payer: Medicaid Other | Source: Ambulatory Visit | Attending: Family Medicine | Admitting: Family Medicine

## 2015-09-01 DIAGNOSIS — R0989 Other specified symptoms and signs involving the circulatory and respiratory systems: Secondary | ICD-10-CM | POA: Insufficient documentation

## 2015-09-02 ENCOUNTER — Telehealth: Payer: Self-pay

## 2015-09-02 NOTE — Telephone Encounter (Signed)
-----   Message from Mariel Aloe, MD sent at 09/02/2015 10:05 AM EDT ----- Please inform patient that she does not have an aortic aneurysm in her stomach.

## 2015-09-02 NOTE — Telephone Encounter (Signed)
Spoke to pt's mother. Pt is at work. She will try to reach pt to have her call. If pt calls, please have her speak to a CMA. Need to inform pt of the information below. Ottis Stain, CMA

## 2015-11-10 ENCOUNTER — Encounter (HOSPITAL_COMMUNITY): Payer: Self-pay

## 2015-11-10 ENCOUNTER — Emergency Department (HOSPITAL_COMMUNITY): Payer: Medicaid Other

## 2015-11-10 ENCOUNTER — Ambulatory Visit (INDEPENDENT_AMBULATORY_CARE_PROVIDER_SITE_OTHER): Payer: Medicaid Other | Admitting: Family Medicine

## 2015-11-10 ENCOUNTER — Emergency Department (HOSPITAL_COMMUNITY)
Admission: EM | Admit: 2015-11-10 | Discharge: 2015-11-10 | Disposition: A | Discharge status: Home / Self Care | Payer: Medicaid Other | Attending: Emergency Medicine | Admitting: Emergency Medicine

## 2015-11-10 ENCOUNTER — Encounter: Payer: Self-pay | Admitting: Family Medicine

## 2015-11-10 ENCOUNTER — Other Ambulatory Visit (HOSPITAL_COMMUNITY)
Admission: RE | Admit: 2015-11-10 | Discharge: 2015-11-10 | Disposition: A | Discharge status: Home / Self Care | Payer: Medicaid Other | Source: Ambulatory Visit | Attending: Family Medicine | Admitting: Family Medicine

## 2015-11-10 VITALS — BP 163/79 | HR 78 | Temp 98.3°F | Wt 166.0 lb

## 2015-11-10 DIAGNOSIS — R109 Unspecified abdominal pain: Secondary | ICD-10-CM

## 2015-11-10 DIAGNOSIS — Z113 Encounter for screening for infections with a predominantly sexual mode of transmission: Secondary | ICD-10-CM | POA: Insufficient documentation

## 2015-11-10 DIAGNOSIS — R102 Pelvic and perineal pain: Secondary | ICD-10-CM

## 2015-11-10 DIAGNOSIS — N8312 Corpus luteum cyst of left ovary: Secondary | ICD-10-CM | POA: Diagnosis not present

## 2015-11-10 DIAGNOSIS — Z87891 Personal history of nicotine dependence: Secondary | ICD-10-CM | POA: Diagnosis not present

## 2015-11-10 DIAGNOSIS — N831 Corpus luteum cyst of ovary, unspecified side: Secondary | ICD-10-CM

## 2015-11-10 DIAGNOSIS — Z91040 Latex allergy status: Secondary | ICD-10-CM | POA: Insufficient documentation

## 2015-11-10 DIAGNOSIS — R1032 Left lower quadrant pain: Secondary | ICD-10-CM | POA: Diagnosis present

## 2015-11-10 DIAGNOSIS — N949 Unspecified condition associated with female genital organs and menstrual cycle: Secondary | ICD-10-CM | POA: Diagnosis not present

## 2015-11-10 DIAGNOSIS — J45909 Unspecified asthma, uncomplicated: Secondary | ICD-10-CM | POA: Insufficient documentation

## 2015-11-10 DIAGNOSIS — Z79899 Other long term (current) drug therapy: Secondary | ICD-10-CM | POA: Insufficient documentation

## 2015-11-10 LAB — COMPREHENSIVE METABOLIC PANEL
ALBUMIN: 3.9 g/dL (ref 3.5–5.0)
ALT: 13 U/L — AB (ref 14–54)
AST: 19 U/L (ref 15–41)
Alkaline Phosphatase: 40 U/L (ref 38–126)
Anion gap: 4 — ABNORMAL LOW (ref 5–15)
BUN: 7 mg/dL (ref 6–20)
CHLORIDE: 107 mmol/L (ref 101–111)
CO2: 26 mmol/L (ref 22–32)
Calcium: 9.1 mg/dL (ref 8.9–10.3)
Creatinine, Ser: 0.89 mg/dL (ref 0.44–1.00)
GFR calc non Af Amer: >60 mL/min (ref >60)
GLUCOSE: 88 mg/dL (ref 65–99)
Potassium: 3.6 mmol/L (ref 3.5–5.1)
SODIUM: 137 mmol/L (ref 135–145)
Total Bilirubin: 0.4 mg/dL (ref 0.3–1.2)
Total Protein: 6.9 g/dL (ref 6.5–8.1)

## 2015-11-10 LAB — POCT WET PREP (WET MOUNT): Clue Cells Wet Prep Whiff POC: POSITIVE

## 2015-11-10 LAB — LIPASE, BLOOD: Lipase: 28 U/L (ref 11–51)

## 2015-11-10 LAB — POCT URINALYSIS DIPSTICK
BILIRUBIN UA: NEGATIVE
Glucose, UA: NEGATIVE
Ketones, UA: NEGATIVE
LEUKOCYTES UA: NEGATIVE
NITRITE UA: NEGATIVE
PH UA: 6
PROTEIN UA: NEGATIVE
RBC UA: NEGATIVE
Spec Grav, UA: 1.02
UROBILINOGEN UA: 0.2

## 2015-11-10 LAB — URINALYSIS, ROUTINE W REFLEX MICROSCOPIC
BILIRUBIN URINE: NEGATIVE
GLUCOSE, UA: NEGATIVE mg/dL
Hgb urine dipstick: NEGATIVE
KETONES UR: NEGATIVE mg/dL
Leukocytes, UA: NEGATIVE
Nitrite: NEGATIVE
PH: 6 (ref 5.0–8.0)
Protein, ur: NEGATIVE mg/dL
SPECIFIC GRAVITY, URINE: 1.031 — AB (ref 1.005–1.030)

## 2015-11-10 LAB — POCT URINE PREGNANCY: Preg Test, Ur: NEGATIVE

## 2015-11-10 LAB — CBC
HEMATOCRIT: 35.5 % — AB (ref 36.0–46.0)
HEMOGLOBIN: 11.4 g/dL — AB (ref 12.0–15.0)
MCH: 25.8 pg — ABNORMAL LOW (ref 26.0–34.0)
MCHC: 32.1 g/dL (ref 30.0–36.0)
MCV: 80.3 fL (ref 78.0–100.0)
Platelets: 401 10*3/uL — ABNORMAL HIGH (ref 150–400)
RBC: 4.42 MIL/uL (ref 3.87–5.11)
RDW: 15.4 % (ref 11.5–15.5)
WBC: 4.8 10*3/uL (ref 4.0–10.5)

## 2015-11-10 MED ORDER — IOPAMIDOL (ISOVUE-300) INJECTION 61%
INTRAVENOUS | Status: AC
  Administered 2015-11-10: 100 mL
  Filled 2015-11-10: qty 100

## 2015-11-10 MED ORDER — HYDROMORPHONE HCL 1 MG/ML IJ SOLN
1.0000 mg | Freq: Once | INTRAMUSCULAR | Status: AC
  Administered 2015-11-10: 1 mg via INTRAVENOUS
  Filled 2015-11-10: qty 1

## 2015-11-10 MED ORDER — HYDROMORPHONE HCL 1 MG/ML IJ SOLN: 2.0000 mg | Freq: Once | INTRAMUSCULAR | Status: DC

## 2015-11-10 NOTE — ED Notes (Addendum)
Per  Pt, Pt started to have sharp abdominal pain in the left side starting last night at 2130. Pt reports no nausea, vomiting, or diarrhea. Pt sent over by PCP. Complains of dizziness and headache, frequent urination.

## 2015-11-10 NOTE — Progress Notes (Signed)
Date of Visit: 11/10/2015   HPI:  Patient presents for a same day appointment to discuss L pelvic pain.  Began having pain on LLQ wrapping around to left back last night around 9:30pm.  Pain is stabbing. Tried heating pad and ibuprofen 800mg . Heating pad helped some. Had fever to 100.2 at work last night, around the time when the pain staretd. Endorses urinating larger volumes but not more frequently. No dysuria or hesitancy. Does feel headache and tightness in her shoulders today.   Also having lots of burping. Took pepto bismol last night when the pain started, got constipated from it. Felt like she needed to have a bowel movement but wasn't able. Normal flatulence. Eating and drinking well. Felt nauseated last night but no vomiting.  ROS: See HPI  [place]: history of migraine, hypertension, allergic rhinitis, GERD, dyshidrotic eczema, DUB, anxiety/depression  PHYSICAL EXAM: BP 163/79 mmHg  Pulse 78  Temp(Src) 98.3 F (36.8 C) (Oral)  Wt 166 lb (75.297 kg)  LMP 10/10/2015 Gen: NAD, appears uncomfortable HEENT: normocephalic, atraumatic, moist mucous membranes, oropharynx clear and moist. Posterior neck & shoulder musculature mildly tender to palpation, mildly tense Heart: regular rate and rhythm, no murmur Lungs: clear to auscultation bilaterally, normal work of breathing  Abdomen: soft. Moderately tender to palpation in LLQ. No rebound tenderness. No peritoneal signs. Neuro: alert, grossly nonfocal, speech normal GU: normal appearing external genitalia without lesions. Vagina is moist with white discharge. Cervix normal in appearance. + cervical motion tenderness and left adnexal tenderness on bimanual exam. No adnexal masses.   ASSESSMENT/PLAN:  1. Left pelvic pain - Suspicious for ovarian or tubal pathology, possible PID though vaginal discharge not frankly purulent. Recent infection with trichomonas noted in urine in April, though treated. - send gc/chlamydia/trich and wet  prep - UA negative, preg negative.  - attempted to schedule stat pelvic ultrasound today or early tomorrow morning but the soonest they have available is late tomorrow. I am concerned this is too early and that patient needs more emergent imaging. She will thus go to the ER at [facility] for further evaluation. - recommend pelvic ultrasound and potentially CT abdomen/pelvis  FOLLOW UP: Going directly to ED for further evaluation.  [place]. Ardelia Mems, [place]

## 2015-11-10 NOTE — ED Provider Notes (Signed)
CSN: [id number]     Arrival date & time 11/10/15  1434 History   First MD Initiated Contact with Patient 11/10/15 2046     Chief Complaint  Patient presents with  . Abdominal Pain     (Consider location/radiation/quality/duration/timing/severity/associated sxs/prior Treatment) HPI Patient presents after developing sharp abdominal pain on the left lower side starting last night. She reports no nausea vomiting or diarrhea. However, she has had frequent urination and a mild headache with dizziness. No weakness or numbness. No chest pain or shortness of breath or cough. States she has not taken anything for the pain. The pain is severe. She has never had pain like this before. Denies any vaginal symptoms. Has had decreased appetite today secondary to the pain. She has had a tubal ligation in the past.  Past Medical History  Diagnosis Date  . Scoliosis   . Complication of anesthesia     difficult waking up "  . Chest pain   . Depression   . Chronic back pain greater than 3 months duration   . Eczema   . Cellulitis   . Anxiety   . Depression   . Asthma    Past Surgical History  Procedure Laterality Date  . Tubal ligation     No family history on file. Social History  Substance Use Topics  . Smoking status: Former Smoker -- 14 years    Quit date: 09/20/2010  . Smokeless tobacco: Never Used  . Alcohol Use: No   OB History    Gravida Para Term Preterm AB TAB SAB Ectopic Multiple Living   7 6 4 2 1   1  6      Review of Systems  Constitutional: Negative for fever.  Allergic/Immunologic: Negative for immunocompromised state.  All other systems reviewed and are negative.  Allergies  Latex; Amoxicillin; Naproxen; and Tramadol  Home Medications   Prior to Admission medications   Medication Sig Start Date End Date Taking? Authorizing Provider  amLODipine (NORVASC) 10 MG tablet Take 1 tablet (10 mg total) by mouth daily. 06/23/15  Yes Konrad Felix, PA  fluticasone (FLONASE)  50 MCG/ACT nasal spray Place 2 sprays into both nostrils daily. Patient not taking: Reported on 11/10/2015 10/10/14   Melony Overly, MD  metroNIDAZOLE (FLAGYL) 500 MG tablet Take 1 tablet (500 mg total) by mouth 2 (two) times daily. Patient not taking: Reported on 11/10/2015 08/29/15   Shary Decamp, PA-C  omeprazole (PRILOSEC) 20 MG capsule Take 1 capsule (20 mg total) by mouth 2 (two) times daily before a meal. Patient not taking: Reported on 08/29/2015 07/25/15   Aquilla Hacker, MD  ondansetron (ZOFRAN) 4 MG tablet Take 1 tablet (4 mg total) by mouth every 6 (six) hours. Prn n/v. Patient not taking: Reported on 08/29/2015 08/24/15   Billy Fischer, MD   BP 154/83 mmHg  Pulse 66  Temp(Src) 98.6 F (37 C) (Oral)  Resp 18  SpO2 99%  LMP 10/10/2015 Physical Exam  Constitutional: She is oriented to person, place, and time. She appears well-developed and well-nourished. No distress.  HENT:  Head: Normocephalic and atraumatic.  Eyes: Conjunctivae are normal. Right eye exhibits no discharge. Left eye exhibits no discharge.  Neck: Normal range of motion. Neck supple.  Cardiovascular: Normal rate and regular rhythm.   Pulmonary/Chest: Effort normal and breath sounds normal. No respiratory distress.  Abdominal: Soft. Bowel sounds are normal. She exhibits no distension and no mass. There is tenderness (LLQ). There is guarding (LLQ). There  is no rebound.  Neg cvat Neg murphys sign  Musculoskeletal: She exhibits no edema.  Neurological: She is alert and oriented to person, place, and time.  Skin: Skin is warm. No rash noted.  Psychiatric: She has a normal mood and affect.  Nursing note and vitals reviewed.  ED Course  Procedures (including critical care time) Labs Review Labs Reviewed  COMPREHENSIVE METABOLIC PANEL - Abnormal; Notable for the following:    ALT 13 (*)    Anion gap 4 (*)    All other components within normal limits  CBC - Abnormal; Notable for the following:    Hemoglobin 11.4 (*)     HCT 35.5 (*)    MCH 25.8 (*)    Platelets 401 (*)    All other components within normal limits  URINALYSIS, ROUTINE W REFLEX MICROSCOPIC (NOT AT [facility]) - Abnormal; Notable for the following:    Specific Gravity, Urine 1.031 (*)    All other components within normal limits  LIPASE, BLOOD    Imaging Review Ct Abdomen Pelvis W Contrast  11/10/2015  CLINICAL DATA:  Left lower quadrant pain. EXAM: CT ABDOMEN AND PELVIS WITH CONTRAST TECHNIQUE: Multidetector CT imaging of the abdomen and pelvis was performed using the standard protocol following bolus administration of intravenous contrast. CONTRAST:  116mL ISOVUE-300 IOPAMIDOL (ISOVUE-300) INJECTION 61% COMPARISON:  None. FINDINGS: The lung bases are normal. No free air. There is a small amount of free fluid in the pelvis and a tiny amount in the right pericolic gutter seen on coronal imaging. There is a low-attenuation lesion in the right hepatic lobe on series 2, image 33 measuring 13 mm and nonspecific. The liver, portal vein, gallbladder, spleen, adrenal glands, and pancreas are normal. The abdominal aorta is non aneurysmal. No adenopathy. The stomach and small bowel are normal. The colon demonstrates moderate fecal loading but is otherwise normal. The appendix is best seen on coronal images and normal with no appendicitis. At least 2 large fibroids are seen in the uterus. A representative posterior fibroid measures 6.5 cm in AP dimension. The uterus is enlarged and bulky. No right adnexal abnormalities. Ring enhancement is seen in the left ovary, likely a corpus luteum cyst. A small amount of fluid is seen in the pelvis. The bladder is normal. No significant bony abnormalities. Delayed images demonstrate no filling defects in the renal collecting systems. The right hepatic lobe lesion is less conspicuous on delayed imaging. On coronal imaging, there was suggestion of mild peripheral discontinuous nodular enhancement. IMPRESSION: 1. Corpus luteum cyst  in the left ovary. Fluid in the pelvis is likely physiologic. 2. Large bulky uterus with large fibroids. 3. Indeterminate lesion in the right hepatic lobe favored to represent a hemangioma. Electronically Signed   By: Dorise Bullion III M.D   On: 11/10/2015 22:11   I have personally reviewed and evaluated these images and lab results as part of my medical decision-making.   EKG Interpretation None      MDM   Final diagnoses:  Corpus luteum cyst    Pain likely secondary to corpus luteum cyst left ovary. Doubt significant hemorrhage and fluid is likely physiologic. Is reassuring without critical anemia. Patient also has large fibroids which could also begin treating to pain. Patient denies any vaginal sentences suggest an STD or tubo-ovarian abscess. No blood in her urine to suggest a kidney stone. Patient has had a tubal ligation in the past and doubt obstetrical problem. Patient instructed to try nonsteroidal anti-inflammatories and follow-up with gynecology.  She feels comfortable with this plan. Return for worsening pain, inability to tolerate by mouth, severe vaginal discharge or bleeding or other concerning symptoms. Patient discharged in good condition.    Karma Greaser, MD 11/10/15 [place], MD 11/11/15 [phone]

## 2015-11-10 NOTE — Patient Instructions (Signed)
Please go straight down to the ER at [facility] I am concerned you could have an infection in your uterus, tube, or something wrong with your ovary I think you need an ultrasound today, and we could not get it scheduled until late tomorrow I will call them and let them know you're coming.  Be well, Dr. Ardelia Mems

## 2015-11-11 ENCOUNTER — Telehealth: Payer: Self-pay | Admitting: Family Medicine

## 2015-11-11 LAB — CERVICOVAGINAL ANCILLARY ONLY
Chlamydia: NEGATIVE
Neisseria Gonorrhea: NEGATIVE
Trichomonas: NEGATIVE

## 2015-11-11 NOTE — Telephone Encounter (Signed)
Attempted to reach patient to discuss labs. No answer, left vm asking her to call back.  When she returns the call please tell her: -Std testing negative but wet prep showed BV -I do not think the BV explains the pain she was having, likely was the ovarian cyst seen on her CT scan in the ED -If she would like treatment for BV I can send in flagyl for her  Leeanne Rio, MD

## 2015-11-11 NOTE — Telephone Encounter (Signed)
Patient informed, states she already has some flagyl. She will go ahead an take it.

## 2016-01-28 ENCOUNTER — Encounter (HOSPITAL_COMMUNITY): Payer: Self-pay | Admitting: *Deleted

## 2016-01-28 ENCOUNTER — Emergency Department (HOSPITAL_COMMUNITY): Payer: Self-pay

## 2016-01-28 DIAGNOSIS — R51 Headache: Secondary | ICD-10-CM | POA: Insufficient documentation

## 2016-01-28 DIAGNOSIS — Z79899 Other long term (current) drug therapy: Secondary | ICD-10-CM | POA: Insufficient documentation

## 2016-01-28 DIAGNOSIS — Z91040 Latex allergy status: Secondary | ICD-10-CM | POA: Insufficient documentation

## 2016-01-28 DIAGNOSIS — Z87891 Personal history of nicotine dependence: Secondary | ICD-10-CM | POA: Insufficient documentation

## 2016-01-28 DIAGNOSIS — J45909 Unspecified asthma, uncomplicated: Secondary | ICD-10-CM | POA: Insufficient documentation

## 2016-01-28 DIAGNOSIS — K219 Gastro-esophageal reflux disease without esophagitis: Secondary | ICD-10-CM | POA: Insufficient documentation

## 2016-01-28 DIAGNOSIS — I10 Essential (primary) hypertension: Secondary | ICD-10-CM | POA: Insufficient documentation

## 2016-01-28 LAB — CBC
HEMATOCRIT: 35.2 % — AB (ref 36.0–46.0)
HEMOGLOBIN: 11.3 g/dL — AB (ref 12.0–15.0)
MCH: 25.6 pg — AB (ref 26.0–34.0)
MCHC: 32.1 g/dL (ref 30.0–36.0)
MCV: 79.8 fL (ref 78.0–100.0)
Platelets: 376 10*3/uL (ref 150–400)
RBC: 4.41 MIL/uL (ref 3.87–5.11)
RDW: 16.2 % — AB (ref 11.5–15.5)
WBC: 6.1 10*3/uL (ref 4.0–10.5)

## 2016-01-28 LAB — BASIC METABOLIC PANEL
Anion gap: 6 (ref 5–15)
BUN: 10 mg/dL (ref 6–20)
CALCIUM: 9.3 mg/dL (ref 8.9–10.3)
CHLORIDE: 106 mmol/L (ref 101–111)
CO2: 26 mmol/L (ref 22–32)
Creatinine, Ser: 0.88 mg/dL (ref 0.44–1.00)
GFR calc Af Amer: >60 mL/min (ref >60)
GLUCOSE: 92 mg/dL (ref 65–99)
POTASSIUM: 4 mmol/L (ref 3.5–5.1)
Sodium: 138 mmol/L (ref 135–145)

## 2016-01-28 LAB — TROPONIN I

## 2016-01-28 NOTE — ED Triage Notes (Signed)
The pt just came in from out-of town at 89 today c/o a headache chest pain anterior and posterior  She describes the chest pain as a soreness  She took tylenol at 1600 she has had some nausea  Also.  lmplast month

## 2016-01-29 ENCOUNTER — Emergency Department (HOSPITAL_COMMUNITY)
Admission: EM | Admit: 2016-01-29 | Discharge: 2016-01-29 | Disposition: A | Discharge status: Home / Self Care | Payer: Self-pay | Attending: Emergency Medicine | Admitting: Emergency Medicine

## 2016-01-29 DIAGNOSIS — R0789 Other chest pain: Secondary | ICD-10-CM

## 2016-01-29 DIAGNOSIS — K219 Gastro-esophageal reflux disease without esophagitis: Secondary | ICD-10-CM

## 2016-01-29 LAB — TROPONIN I: Troponin I: <0.03 ng/mL (ref <0.03)

## 2016-01-29 MED ORDER — RANITIDINE HCL 150 MG PO TABS: 150.0000 mg | ORAL_TABLET | Freq: Two times a day (BID) | ORAL | 0 refills | Status: DC

## 2016-01-29 MED ORDER — GI COCKTAIL ~~LOC~~
30.0000 mL | Freq: Once | ORAL | Status: AC
  Administered 2016-01-29: 30 mL via ORAL
  Filled 2016-01-29: qty 30

## 2016-01-29 NOTE — Discharge Instructions (Signed)
We saw you in the ER for the chest pain. All of our cardiac workup is normal, including labs, EKG and chest X-RAY are normal. We are not sure what is causing your discomfort, but we feel comfortable sending you home at this time. It's possible that this is GERD or gastritis. The workup in the ER is not complete, and you should follow up with your primary care doctor for further evaluation.  Please return to the ER if you have worsening chest pain, shortness of breath, pain radiating to your jaw, shoulder, or back, sweats or fainting. Otherwise see the Cardiologist or your primary care doctor as requested.

## 2016-01-29 NOTE — ED Notes (Signed)
Pt verbalized understanding of d/c instructions and has no further questions. Pt stable and NAD. Pt is pain free at time of d/c. Pt to follow up with family practice.

## 2016-01-29 NOTE — ED Provider Notes (Signed)
[place] DEPT Provider Note   CSN: [id number] Arrival date & time: 01/28/16  2124  By signing my name below, I, Gwenlyn Fudge, attest that this documentation has been prepared under the direction and in the presence of Varney Biles, MD. Electronically Signed: Gwenlyn Fudge, ED Scribe. 01/29/16. 1:30 AM.   History   Chief Complaint Chief Complaint  Patient presents with  . Headache  . Chest Pain   The history is provided by the patient. No language interpreter was used.   HPI Comments: Susan Sanders is a 45 y.o. female with PMHx of GERD and Asthma who presents to the Emergency Department complaining of gradual onset, constant chest pain onset 1 PM today. She was sitting down when she began experiencing pain. Pain is a dull aching pain. When she swallows she states she can feel it go down and has a taste in her throat. She reports associated nausea, chills, vomiting, wheezing, and back pain. She states her back pain began 1 week ago. She does not take any medication for her GERD. No hx of smoking, substance abuse. Pt denies PMHx of DM, MI. Pt reports FHx of MI; "brother had a massive heart attack at 85" with no risk factors of DM or HTN. Denies fever, shortness of breath.   Past Medical History:  Diagnosis Date  . Anxiety   . Asthma   . Cellulitis   . Chest pain   . Chronic back pain greater than 3 months duration   . Complication of anesthesia    difficult waking up "  . Depression   . Depression   . Eczema   . Scoliosis     Patient Active Problem List   Diagnosis Date Noted  . Essential hypertension 07/14/2015  . Cervical spondylolysis 06/18/2014  . Cervical cancer screening 12/10/2013  . Healthcare maintenance 12/10/2013  . Former smoker 12/10/2013  . GERD (gastroesophageal reflux disease) 12/10/2013  . Fatigue 09/09/2013  . Pain, joint, hand, right 05/20/2013  . Dyshidrotic eczema 12/26/2011  . Back pain 12/05/2010  . ALLERGIC RHINITIS 08/20/2008  .  DYSFUNCTIONAL UTERINE BLEEDING 03/20/2007  . MIGRAINE HEADACHE 02/20/2007  . Anxiety and depression 01/23/2007    Past Surgical History:  Procedure Laterality Date  . TUBAL LIGATION      OB History    Gravida Para Term Preterm AB Living   7 6 4 2 1 6    SAB TAB Ectopic Multiple Live Births       1         Home Medications    Prior to Admission medications   Medication Sig Start Date End Date Taking? Authorizing Provider  amLODipine (NORVASC) 10 MG tablet Take 1 tablet (10 mg total) by mouth daily. 06/23/15   Konrad Felix, PA  fluticasone (FLONASE) 50 MCG/ACT nasal spray Place 2 sprays into both nostrils daily. Patient not taking: Reported on 11/10/2015 10/10/14   Melony Overly, MD  metroNIDAZOLE (FLAGYL) 500 MG tablet Take 1 tablet (500 mg total) by mouth 2 (two) times daily. Patient not taking: Reported on 11/10/2015 08/29/15   Shary Decamp, PA-C  omeprazole (PRILOSEC) 20 MG capsule Take 1 capsule (20 mg total) by mouth 2 (two) times daily before a meal. Patient not taking: Reported on 08/29/2015 07/25/15   Aquilla Hacker, MD  ondansetron (ZOFRAN) 4 MG tablet Take 1 tablet (4 mg total) by mouth every 6 (six) hours. Prn n/v. Patient not taking: Reported on 08/29/2015 08/24/15   Billy Fischer, MD  ranitidine (ZANTAC) 150 MG tablet Take 1 tablet (150 mg total) by mouth 2 (two) times daily. 01/29/16   Varney Biles, MD    Family History No family history on file.  Social History Social History  Substance Use Topics  . Smoking status: Former Smoker    Years: 14.00    Quit date: 09/20/2010  . Smokeless tobacco: Never Used  . Alcohol use No    Allergies   Latex; Amoxicillin; Naproxen; and Tramadol   Review of Systems Review of Systems  10 Systems reviewed and are negative for acute change except as noted in the HPI.  Physical Exam Updated Vital Signs BP 168/87   Pulse 66   Temp 98.4 F (36.9 C) (Oral)   Resp 18   Ht 5\' 5"  (1.651 m)   Wt 160 lb (72.6 kg)   LMP 12/28/2015    SpO2 100%   BMI 26.63 kg/m   Physical Exam  Constitutional: She is oriented to person, place, and time. She appears well-developed and well-nourished.  HENT:  Head: Normocephalic and atraumatic.  Eyes: Conjunctivae are normal.  Neck: No JVD present.  Cardiovascular: Normal rate and regular rhythm.   Pulmonary/Chest: Effort normal and breath sounds normal. No respiratory distress. She has no wheezes. She has no rales.  Lungs clear to auscultation   Musculoskeletal:  No pitting edema or unilateral swelling  Neurological: She is alert and oriented to person, place, and time.  Skin: Skin is warm and dry. She is not diaphoretic.  Psychiatric: She has a normal mood and affect. Her behavior is normal.  Nursing note and vitals reviewed.  ED Treatments / Results  DIAGNOSTIC STUDIES: Oxygen Saturation is 100% on RA, normal by my interpretation.    COORDINATION OF CARE: 1:20 AM Discussed treatment plan with pt at bedside which includes cardiac enzymes and lab work and pt agreed to plan.  2:35 AM On repeat exam, pain has resolved after GI Cocktail. Pt advised to follow up with PCP. Strict return precautions discussed for chest pains. Pt agrees with the plan of care.  Labs (all labs ordered are listed, but only abnormal results are displayed) Labs Reviewed  CBC - Abnormal; Notable for the following:       Result Value   Hemoglobin 11.3 (*)    HCT 35.2 (*)    MCH 25.6 (*)    RDW 16.2 (*)    All other components within normal limits  BASIC METABOLIC PANEL  TROPONIN I  TROPONIN I  URINALYSIS, ROUTINE W REFLEX MICROSCOPIC (NOT AT [facility])  POC URINE PREG, ED    EKG  EKG Interpretation  Date/Time:  Saturday January 28 2016 21:28:34 EDT Ventricular Rate:  73 PR Interval:  120 QRS Duration: 68 QT Interval:  386 QTC Calculation: 425 R Axis:   63 Text Interpretation:  Normal sinus rhythm Normal ECG When compared with ECG of 03/09/2013, No significant change was found Confirmed by  [facility]  MD, DAVID ([id number]) on 01/29/2016 12:47:31 AM       Radiology Dg Chest 2 View  Result Date: 01/28/2016 CLINICAL DATA:  Headache, chest pain anteriorly and posteriorly, nausea, former smoker, history asthma EXAM: CHEST  2 VIEW COMPARISON:  03/09/2013 FINDINGS: Normal heart size, mediastinal contours, and pulmonary vascularity. Minimal chronic peribronchial thickening. No acute infiltrate, pleural effusion or pneumothorax. Linear scarring RIGHT base. Bones demineralized. IMPRESSION: No acute abnormalities. Electronically Signed   By: Lavonia Dana M.D.   On: 01/28/2016 22:26    Procedures Procedures (including critical  care time)  Medications Ordered in ED Medications  gi cocktail (Maalox,Lidocaine,Donnatal) (30 mLs Oral Given 01/29/16 0143)     Initial Impression / Assessment and Plan / ED Course  I have reviewed the triage vital signs and the nursing notes.  Pertinent labs & imaging results that were available during my care of the patient were reviewed by me and considered in my medical decision making (see chart for details).  Clinical Course    I personally performed the services described in this documentation, which was scribed in my presence. The recorded information has been reviewed and is accurate.  Pt comes in with cc of chest pain. Epigastric chest pain radiating upwards, gets worse when she swallows. We will get trops x 2. HEAR score is 3 - 1 for age and 2 for risk factors. GERD seems likely etiology.  Final Clinical Impressions(s) / ED Diagnoses   Final diagnoses:  Atypical chest pain  Gastroesophageal reflux disease, esophagitis presence not specified    New Prescriptions New Prescriptions   RANITIDINE (ZANTAC) 150 MG TABLET    Take 1 tablet (150 mg total) by mouth 2 (two) times daily.       Varney Biles, MD 01/29/16 [phone]

## 2016-02-04 ENCOUNTER — Emergency Department (HOSPITAL_COMMUNITY): Payer: Medicaid Other

## 2016-02-04 ENCOUNTER — Encounter (HOSPITAL_COMMUNITY): Payer: Self-pay

## 2016-02-04 ENCOUNTER — Other Ambulatory Visit: Payer: Self-pay

## 2016-02-04 ENCOUNTER — Inpatient Hospital Stay (HOSPITAL_COMMUNITY)
Admission: EM | Admit: 2016-02-04 | Discharge: 2016-02-07 | DRG: 641 | Disposition: A | Discharge status: Home / Self Care | Payer: Medicaid Other | Attending: Family Medicine | Admitting: Family Medicine

## 2016-02-04 DIAGNOSIS — E162 Hypoglycemia, unspecified: Principal | ICD-10-CM | POA: Diagnosis present

## 2016-02-04 DIAGNOSIS — G43909 Migraine, unspecified, not intractable, without status migrainosus: Secondary | ICD-10-CM | POA: Diagnosis present

## 2016-02-04 DIAGNOSIS — D649 Anemia, unspecified: Secondary | ICD-10-CM | POA: Diagnosis present

## 2016-02-04 DIAGNOSIS — I10 Essential (primary) hypertension: Secondary | ICD-10-CM | POA: Diagnosis present

## 2016-02-04 DIAGNOSIS — Z87891 Personal history of nicotine dependence: Secondary | ICD-10-CM

## 2016-02-04 DIAGNOSIS — R55 Syncope and collapse: Secondary | ICD-10-CM | POA: Diagnosis present

## 2016-02-04 DIAGNOSIS — I499 Cardiac arrhythmia, unspecified: Secondary | ICD-10-CM | POA: Diagnosis present

## 2016-02-04 DIAGNOSIS — Z6826 Body mass index (BMI) 26.0-26.9, adult: Secondary | ICD-10-CM

## 2016-02-04 DIAGNOSIS — Z79899 Other long term (current) drug therapy: Secondary | ICD-10-CM

## 2016-02-04 DIAGNOSIS — E663 Overweight: Secondary | ICD-10-CM | POA: Diagnosis present

## 2016-02-04 DIAGNOSIS — M6281 Muscle weakness (generalized): Secondary | ICD-10-CM

## 2016-02-04 DIAGNOSIS — K219 Gastro-esophageal reflux disease without esophagitis: Secondary | ICD-10-CM | POA: Diagnosis present

## 2016-02-04 HISTORY — DX: Essential (primary) hypertension: I10

## 2016-02-04 LAB — CBC WITH DIFFERENTIAL/PLATELET
Basophils Absolute: 0 10*3/uL (ref 0.0–0.1)
Basophils Relative: 1 %
EOS ABS: 0.1 10*3/uL (ref 0.0–0.7)
EOS PCT: 2 %
HCT: 36.4 % (ref 36.0–46.0)
Hemoglobin: 11.6 g/dL — ABNORMAL LOW (ref 12.0–15.0)
LYMPHS ABS: 1.8 10*3/uL (ref 0.7–4.0)
Lymphocytes Relative: 37 %
MCH: 25.7 pg — AB (ref 26.0–34.0)
MCHC: 31.9 g/dL (ref 30.0–36.0)
MCV: 80.5 fL (ref 78.0–100.0)
MONO ABS: 0.5 10*3/uL (ref 0.1–1.0)
MONOS PCT: 10 %
Neutro Abs: 2.4 10*3/uL (ref 1.7–7.7)
Neutrophils Relative %: 50 %
PLATELETS: 325 10*3/uL (ref 150–400)
RBC: 4.52 MIL/uL (ref 3.87–5.11)
RDW: 15.8 % — AB (ref 11.5–15.5)
WBC: 4.8 10*3/uL (ref 4.0–10.5)

## 2016-02-04 LAB — I-STAT TROPONIN, ED: Troponin i, poc: 0 ng/mL (ref 0.00–0.08)

## 2016-02-04 LAB — MAGNESIUM: Magnesium: 1.7 mg/dL (ref 1.7–2.4)

## 2016-02-04 LAB — COMPREHENSIVE METABOLIC PANEL
ALBUMIN: 3.6 g/dL (ref 3.5–5.0)
ALT: 14 U/L (ref 14–54)
AST: 22 U/L (ref 15–41)
Alkaline Phosphatase: 40 U/L (ref 38–126)
Anion gap: 10 (ref 5–15)
BUN: 8 mg/dL (ref 6–20)
CHLORIDE: 111 mmol/L (ref 101–111)
CO2: 19 mmol/L — ABNORMAL LOW (ref 22–32)
CREATININE: 0.82 mg/dL (ref 0.44–1.00)
Calcium: 9.3 mg/dL (ref 8.9–10.3)
GFR calc Af Amer: >60 mL/min (ref >60)
GLUCOSE: 80 mg/dL (ref 65–99)
Potassium: 3.8 mmol/L (ref 3.5–5.1)
Sodium: 140 mmol/L (ref 135–145)
Total Bilirubin: 0.6 mg/dL (ref 0.3–1.2)
Total Protein: 7.1 g/dL (ref 6.5–8.1)

## 2016-02-04 LAB — PHOSPHORUS: PHOSPHORUS: 2.2 mg/dL — AB (ref 2.5–4.6)

## 2016-02-04 LAB — TSH: TSH: 1.16 u[IU]/mL (ref 0.350–4.500)

## 2016-02-04 LAB — I-STAT BETA HCG BLOOD, ED (MC, WL, AP ONLY)

## 2016-02-04 MED ORDER — AMLODIPINE BESYLATE 10 MG PO TABS
10.0000 mg | ORAL_TABLET | Freq: Every day | ORAL | Status: DC
  Administered 2016-02-04 – 2016-02-07 (×4): 10 mg via ORAL
  Filled 2016-02-04 (×4): qty 1

## 2016-02-04 MED ORDER — SODIUM CHLORIDE 0.9 % IV BOLUS (SEPSIS)
1000.0000 mL | Freq: Once | INTRAVENOUS | Status: AC
  Administered 2016-02-04: 1000 mL via INTRAVENOUS

## 2016-02-04 MED ORDER — DICLOFENAC SODIUM 1 % TD GEL
2.0000 g | Freq: Four times a day (QID) | TRANSDERMAL | Status: DC
  Administered 2016-02-05 – 2016-02-07 (×9): 2 g via TOPICAL
  Filled 2016-02-04 (×2): qty 100

## 2016-02-04 MED ORDER — ACETAMINOPHEN 650 MG RE SUPP: 650.0000 mg | Freq: Four times a day (QID) | RECTAL | Status: DC | PRN

## 2016-02-04 MED ORDER — TRAMADOL HCL 50 MG PO TABS
50.0000 mg | ORAL_TABLET | Freq: Four times a day (QID) | ORAL | Status: DC
  Administered 2016-02-04 – 2016-02-07 (×10): 50 mg via ORAL
  Filled 2016-02-04 (×10): qty 1

## 2016-02-04 MED ORDER — ACETAMINOPHEN 325 MG PO TABS
650.0000 mg | ORAL_TABLET | Freq: Four times a day (QID) | ORAL | Status: DC | PRN
  Administered 2016-02-04: 650 mg via ORAL
  Filled 2016-02-04: qty 2

## 2016-02-04 MED ORDER — SODIUM CHLORIDE 0.9% FLUSH
3.0000 mL | Freq: Two times a day (BID) | INTRAVENOUS | Status: DC
  Administered 2016-02-05 – 2016-02-07 (×4): 3 mL via INTRAVENOUS

## 2016-02-04 MED ORDER — FAMOTIDINE 20 MG PO TABS
20.0000 mg | ORAL_TABLET | Freq: Every day | ORAL | Status: DC
  Administered 2016-02-04 – 2016-02-07 (×4): 20 mg via ORAL
  Filled 2016-02-04 (×4): qty 1

## 2016-02-04 MED ORDER — INFLUENZA VAC SPLIT QUAD 0.5 ML IM SUSY: 0.5000 mL | PREFILLED_SYRINGE | INTRAMUSCULAR | Status: DC | PRN

## 2016-02-04 MED ORDER — ENOXAPARIN SODIUM 40 MG/0.4ML ~~LOC~~ SOLN
40.0000 mg | SUBCUTANEOUS | Status: DC
  Administered 2016-02-04 – 2016-02-06 (×3): 40 mg via SUBCUTANEOUS
  Filled 2016-02-04 (×3): qty 0.4

## 2016-02-04 MED ORDER — HYDRALAZINE HCL 20 MG/ML IJ SOLN: 5.0000 mg | INTRAMUSCULAR | Status: DC | PRN

## 2016-02-04 MED ORDER — SODIUM CHLORIDE 0.9 % IV SOLN
INTRAVENOUS | Status: DC
  Administered 2016-02-04 – 2016-02-06 (×4): via INTRAVENOUS

## 2016-02-04 NOTE — ED Notes (Signed)
Admitting at bedside 

## 2016-02-04 NOTE — ED Provider Notes (Signed)
[facility] DEPT Provider Note   CSN: [id number] Arrival date & time: 02/04/16  1315     History   Chief Complaint Chief Complaint  Patient presents with  . Loss of Consciousness    HPI Susan Sanders is a 45 y.o. female.  The history is provided by the patient.  Loss of Consciousness   This is a new problem. The current episode started 3 to 5 hours ago. Episode frequency: Once. The problem has been gradually improving. She lost consciousness for a period of 1 to 5 minutes. Associated with: She was walking, felt dizzy. Associated symptoms include dizziness and light-headedness. Pertinent negatives include abdominal pain, chest pain, fever, visual change and weakness. She has tried nothing for the symptoms. The treatment provided no relief. Her past medical history is significant for HTN. Her past medical history does not include CAD or CVA.      Past Medical History:  Diagnosis Date  . Anxiety   . Asthma   . Cellulitis   . Chest pain   . Chronic back pain greater than 3 months duration   . Complication of anesthesia    difficult waking up "  . Depression   . Depression   . Eczema   . Hypertension   . Scoliosis     Patient Active Problem List   Diagnosis Date Noted  . Syncope 02/04/2016  . Essential hypertension 07/14/2015  . Cervical spondylolysis 06/18/2014  . Cervical cancer screening 12/10/2013  . Healthcare maintenance 12/10/2013  . Former smoker 12/10/2013  . GERD (gastroesophageal reflux disease) 12/10/2013  . Fatigue 09/09/2013  . Pain, joint, hand, right 05/20/2013  . Dyshidrotic eczema 12/26/2011  . Back pain 12/05/2010  . ALLERGIC RHINITIS 08/20/2008  . DYSFUNCTIONAL UTERINE BLEEDING 03/20/2007  . MIGRAINE HEADACHE 02/20/2007  . Anxiety and depression 01/23/2007    Past Surgical History:  Procedure Laterality Date  . TUBAL LIGATION      OB History    Gravida Para Term Preterm AB Living   7 6 4 2 1 6    SAB TAB Ectopic Multiple Live Births         1           Home Medications    Prior to Admission medications   Medication Sig Start Date End Date Taking? Authorizing Provider  amLODipine (NORVASC) 10 MG tablet Take 1 tablet (10 mg total) by mouth daily. 06/23/15  Yes Konrad Felix, PA  Multiple Vitamins-Minerals (MULTIVITAMIN WITH MINERALS) tablet Take 1 tablet by mouth daily.   Yes Historical Provider, MD  ranitidine (ZANTAC) 150 MG tablet Take 1 tablet (150 mg total) by mouth 2 (two) times daily. 01/29/16  Yes Varney Biles, MD    Family History History reviewed. No pertinent family history.  Social History Social History  Substance Use Topics  . Smoking status: Former Smoker    Years: 14.00    Quit date: 09/20/2010  . Smokeless tobacco: Never Used  . Alcohol use Yes     Comment: occasional      Allergies   Latex; Amoxicillin; and Naproxen   Review of Systems Review of Systems  Constitutional: Negative for fatigue and fever.  Cardiovascular: Positive for syncope. Negative for chest pain.  Gastrointestinal: Negative for abdominal pain.  Neurological: Positive for dizziness, syncope and light-headedness. Negative for weakness.  All other systems reviewed and are negative.    Physical Exam Updated Vital Signs BP (!) 151/101   Pulse 80   Temp 98.3 F (36.8 C) (  Oral)   Resp 19   Ht 5\' 5"  (1.651 m)   Wt 168 lb 14.4 oz (76.6 kg)   LMP 12/28/2015   SpO2 100%   BMI 28.11 kg/m   Physical Exam  Constitutional: She is oriented to person, place, and time. She appears well-developed and well-nourished. No distress.  HENT:  Head: Normocephalic and atraumatic.  Eyes: Conjunctivae are normal.  Neck: Neck supple.  Cardiovascular: Normal rate and regular rhythm.   No murmur heard. Pulmonary/Chest: Effort normal and breath sounds normal. No respiratory distress.  Abdominal: Soft. There is no tenderness.  Musculoskeletal: She exhibits no edema.  Neurological: She is alert and oriented to person, place, and  time. No cranial nerve deficit.  Skin: Skin is warm and dry.  Psychiatric: She has a normal mood and affect.  Nursing note and vitals reviewed.    ED Treatments / Results  Labs (all labs ordered are listed, but only abnormal results are displayed) Labs Reviewed  COMPREHENSIVE METABOLIC PANEL - Abnormal; Notable for the following:       Result Value   CO2 19 (*)    All other components within normal limits  CBC WITH DIFFERENTIAL/PLATELET - Abnormal; Notable for the following:    Hemoglobin 11.6 (*)    MCH 25.7 (*)    RDW 15.8 (*)    All other components within normal limits  PHOSPHORUS - Abnormal; Notable for the following:    Phosphorus 2.2 (*)    All other components within normal limits  URINALYSIS, ROUTINE W REFLEX MICROSCOPIC (NOT AT [facility]) - Abnormal; Notable for the following:    Hgb urine dipstick TRACE (*)    All other components within normal limits  BASIC METABOLIC PANEL - Abnormal; Notable for the following:    CO2 21 (*)    Calcium 8.5 (*)    All other components within normal limits  CBC - Abnormal; Notable for the following:    Hemoglobin 10.5 (*)    HCT 32.8 (*)    MCH 25.7 (*)    RDW 16.1 (*)    All other components within normal limits  URINE MICROSCOPIC-ADD ON - Abnormal; Notable for the following:    Squamous Epithelial / LPF 0-5 (*)    Bacteria, UA RARE (*)    All other components within normal limits  MAGNESIUM  TSH  GLUCOSE, CAPILLARY  URINE RAPID DRUG SCREEN, HOSP PERFORMED  I-STAT TROPOININ, ED  I-STAT BETA HCG BLOOD, ED ([facility], [facility], [facility] ONLY)    EKG  EKG Interpretation  Date/Time:  Saturday February 04 2016 13:15:08 EDT Ventricular Rate:  82 PR Interval:    QRS Duration: 79 QT Interval:  364 QTC Calculation: 426 R Axis:   58 Text Interpretation:  Sinus rhythm No significant change since last tracing Confirmed by Gerald Leitz ([id number]) on 02/04/2016 2:38:26 PM       Radiology Dg Chest 2 View  Result Date: 02/04/2016 CLINICAL DATA:   Fall.  Hypertension.  Ex-smoker. EXAM: CHEST  2 VIEW COMPARISON:  01/28/2016 FINDINGS: Lateral view degraded by patient arm position. S-shaped thoracic spine curvature. Patient rotated right on the frontal. Midline trachea. Normal heart size and mediastinal contours. No pleural effusion or pneumothorax. Clear lungs. IMPRESSION: No acute cardiopulmonary disease. Electronically Signed   By: Abigail Miyamoto M.D.   On: 02/04/2016 15:34   Ct Head Wo Contrast  Result Date: 02/04/2016 CLINICAL DATA:  Loss of consciousness, hit back of head. EXAM: CT HEAD WITHOUT CONTRAST TECHNIQUE: Contiguous axial images were  obtained from the base of the skull through the vertex without intravenous contrast. COMPARISON:  04/29/2011 CT head FINDINGS: Brain: No evidence of acute infarction, hemorrhage, hydrocephalus, extra-axial collection or mass lesion/mass effect. Vascular: Unremarkable Skull: Normal. Negative for fracture or focal lesion. Sinuses/Orbits: Unremarkable Other: No scalp hematoma, soft tissue induration nor radiopaque foreign bodies. IMPRESSION: Normal head CT. Electronically Signed   By: Ashley Royalty M.D.   On: 02/04/2016 16:20    Procedures Procedures (including critical care time)  Medications Ordered in ED Medications  famotidine (PEPCID) tablet 20 mg (20 mg Oral Given 02/05/16 0904)  amLODipine (NORVASC) tablet 10 mg (10 mg Oral Given 02/05/16 0904)  sodium chloride flush (NS) 0.9 % injection 3 mL (3 mLs Intravenous Given 02/05/16 1000)  enoxaparin (LOVENOX) injection 40 mg (40 mg Subcutaneous Given 02/04/16 2204)  0.9 %  sodium chloride infusion ( Intravenous New Bag/Given 02/05/16 0441)  acetaminophen (TYLENOL) tablet 650 mg (650 mg Oral Given 02/04/16 1921)    Or  acetaminophen (TYLENOL) suppository 650 mg ( Rectal See Alternative 02/04/16 1921)  hydrALAZINE (APRESOLINE) injection 5 mg (not administered)  Influenza vac split quadrivalent PF (FLUARIX) injection 0.5 mL (not administered)  traMADol (ULTRAM)  tablet 50 mg (50 mg Oral Given 02/05/16 0545)  diclofenac sodium (VOLTAREN) 1 % transdermal gel 2 g (2 g Topical Given 02/05/16 0904)  phosphorus (K PHOS NEUTRAL) tablet 250 mg (250 mg Oral Given 02/05/16 0904)  sodium chloride 0.9 % bolus 1,000 mL (0 mLs Intravenous Stopped 02/04/16 1621)     Initial Impression / Assessment and Plan / ED Course  I have reviewed the triage vital signs and the nursing notes.  Pertinent labs & imaging results that were available during my care of the patient were reviewed by me and considered in my medical decision making (see chart for details).  Clinical Course    Patient is a 45 year old female presenting with syncope. Patient was at her mother's house and was getting ready to go shopping. She was walking when she felt dizzy and then had complete syncope fell down and hit her head. Patient's family members at bedside reports that she was passed out for a period of time. Given the syncope we will admit for syncope rule out. We'll get CT head given the trauma. Will admit considering concern for dysrhythmia.  Of note patient reports she's not been taking her antihypertensives as prescribed.  Final Clinical Impressions(s) / ED Diagnoses   Final diagnoses:  Syncope and collapse    New Prescriptions Current Discharge Medication List       Alric Geise Julio Alm, MD 02/05/16 [phone]

## 2016-02-04 NOTE — H&P (Signed)
[facility] Admission History and Physical Service Pager: [phone]  Patient name: Susan Sanders Medical record number: [id number] Date of birth: [birth year] Age: 45 y.o. Gender: female  Primary Care Provider: Smiley Houseman, MD Consultants: none Code Status: FULL  Chief Complaint: syncope  Assessment and Plan: Susan Sanders is a 45 y.o. female presenting with syncope. PMH is significant for HTN, dysfunctional uterine bleeding.  Syncope with fall: Without LOC or neuro sx. Persistent headache present, but improved from fall. No other injuries. Afebrile, no signs of infection. CXR without acute process. CT head without acute process. Differential includes hypoglycemia due to no breakfast, hypertension, arrhythmia. iStat trop neg. Less likely etiologies include seizure and stroke.  -observe on tele, attending Dr. Nori Riis -EKG in am -TSH, Mg, phos -CBC, BMP -orthostatics in am -echo -carotid doppler -UDS -UA  Hypertension: Pt takes amlodipine 10mg  at home. Did not take it the morning of admission. Systolic BP of [id number] per EMS. Improved to 130s on admission.  -restart home norvasc -PRN hydralazine SBP >160 / DBP >110   Headache: Likely secondary to fall. CT head without acute process. No open wounds to posterior occiput.  -monitor pain -PRN tylenol  FEN/GI: heart healthy diet, home zantac Prophylaxis: lovenox  Disposition: observation on tele  History of Present Illness:  Susan Sanders is a 45 y.o. female presenting with syncope.  Was at her mother's house preparing for a party when she felt dizzy, noticed the room spinning, and fell backwards onto the ground and hit back of her head on dirt ground. Initial reports said she had LOC for several minutes, but daughter is present and said that her mom responded immediately, but was just slow to speak. Per patient's daughter who accompanied her in ED, patient was never unconscious, and answered questions  immediately after the incident. Daughter says she was "slower" for about three minutes. Daughter denies witnessing tongue biting, incontinence, or convulsions. Patient reports that this happened once before when her blood pressure was very high. Patient says that her BP has been elevated recently, and she had not taken her BP meds yet this AM before this incident. She also had not eaten or had anything to drink yet.  Patient endorsing HA, as well as numbness and tingling of R arm. Denies weakness, vision changes, dizziness. Says HA is improved from before, but does have associated photophobia. Denies phonophobia. Denies N/V.   Review Of Systems: Per HPI with the following additions: none.  ROS  Patient Active Problem List   Diagnosis Date Noted  . Syncope 02/04/2016  . Essential hypertension 07/14/2015  . Cervical spondylolysis 06/18/2014  . Cervical cancer screening 12/10/2013  . Healthcare maintenance 12/10/2013  . Former smoker 12/10/2013  . GERD (gastroesophageal reflux disease) 12/10/2013  . Fatigue 09/09/2013  . Pain, joint, hand, right 05/20/2013  . Dyshidrotic eczema 12/26/2011  . Back pain 12/05/2010  . ALLERGIC RHINITIS 08/20/2008  . DYSFUNCTIONAL UTERINE BLEEDING 03/20/2007  . MIGRAINE HEADACHE 02/20/2007  . Anxiety and depression 01/23/2007    Past Medical History: Past Medical History:  Diagnosis Date  . Anxiety   . Asthma   . Cellulitis   . Chest pain   . Chronic back pain greater than 3 months duration   . Complication of anesthesia    difficult waking up "  . Depression   . Depression   . Eczema   . Hypertension   . Scoliosis     Past Surgical History: Past Surgical History:  Procedure Laterality Date  . TUBAL LIGATION      Social History: Social History  Substance Use Topics  . Smoking status: Former Smoker    Years: 14.00    Quit date: 09/20/2010  . Smokeless tobacco: Never Used  . Alcohol use Yes     Comment: occasional    Additional social  history: lives with three of her six children.  Please also refer to relevant sections of EMR.  Family History: History reviewed. No pertinent family history.   Allergies and Medications: Allergies  Allergen Reactions  . Latex Shortness Of Breath    Itching and burns skin  . Amoxicillin Itching and Swelling  . Naproxen Hives and Itching    Takes IBU without difficulty  . Tramadol Hives and Itching    Takes IBU without difficulty   No current facility-administered medications on file prior to encounter.    Current Outpatient Prescriptions on File Prior to Encounter  Medication Sig Dispense Refill  . amLODipine (NORVASC) 10 MG tablet Take 1 tablet (10 mg total) by mouth daily. 30 tablet 0  . ranitidine (ZANTAC) 150 MG tablet Take 1 tablet (150 mg total) by mouth 2 (two) times daily. 60 tablet 0    Objective: BP 138/76 (BP Location: Left Arm)   Pulse 70   Temp 98.4 F (36.9 C) (Oral)   Resp 16   Ht 5\' 5"  (1.651 m)   Wt 160 lb (72.6 kg)   LMP 12/28/2015   SpO2 100%   BMI 26.63 kg/m  Exam: General: Overweight female lying in bed.  Eyes: EOMI, PERRLA ENTM: moist mucous membranes Cardiovascular: rrr, no m/r/g Respiratory: CTAB, good air movemvent Gastrointestinal: SNTND, +BS MSK: 5/5 strength in all extremities Derm: no rashes or wounds on exposed skin  Neuro: CN II-XII, 5/5 strength in all extremities, sensation in tact Psych: mood appropriate, flat affect  Labs and Imaging: CBC BMET   Recent Labs Lab 02/04/16 1450  WBC 4.8  HGB 11.6*  HCT 36.4  PLT 325    Recent Labs Lab 02/04/16 1450  NA 140  K 3.8  CL 111  CO2 19*  BUN 8  CREATININE 0.82  GLUCOSE 80  CALCIUM 9.3     Dg Chest 2 View  Result Date: 02/04/2016 CLINICAL DATA:  Fall.  Hypertension.  Ex-smoker. EXAM: CHEST  2 VIEW COMPARISON:  01/28/2016 FINDINGS: Lateral view degraded by patient arm position. S-shaped thoracic spine curvature. Patient rotated right on the frontal. Midline trachea.  Normal heart size and mediastinal contours. No pleural effusion or pneumothorax. Clear lungs. IMPRESSION: No acute cardiopulmonary disease. Electronically Signed   By: Labarron Durnin Miyamoto M.D.   On: 02/04/2016 15:34   Ct Head Wo Contrast  Result Date: 02/04/2016 CLINICAL DATA:  Loss of consciousness, hit back of head. EXAM: CT HEAD WITHOUT CONTRAST TECHNIQUE: Contiguous axial images were obtained from the base of the skull through the vertex without intravenous contrast. COMPARISON:  04/29/2011 CT head FINDINGS: Brain: No evidence of acute infarction, hemorrhage, hydrocephalus, extra-axial collection or mass lesion/mass effect. Vascular: Unremarkable Skull: Normal. Negative for fracture or focal lesion. Sinuses/Orbits: Unremarkable Other: No scalp hematoma, soft tissue induration nor radiopaque foreign bodies. IMPRESSION: Normal head CT. Electronically Signed   By: Ashley Royalty M.D.   On: 02/04/2016 16:20   Verner Mould, MD 02/04/2016, 8:34 PM PGY-1, [place] Intern pager: [phone], text pages welcome  UPPER LEVEL ADDENDUM  I have read the above note and made revisions highlighted in orange.  Adin Hector, MD, MPH PGY-2 [facility] Pager [phone]

## 2016-02-04 NOTE — ED Notes (Signed)
EDP at bedside  

## 2016-02-04 NOTE — ED Triage Notes (Signed)
Pt. Coming from home via [facility] for HTN and syncope. Pt. In yard with friends and passed out. Pt. BP 210 palp. When EMS arrived and HR 116. Pt. BP 143/87 en route and 96 HR. Pt. sts she forgot to take her BP medication today and that this has happened to her before. Pt. C/o HA, dizziness, and blurry vision at this time. Pt. Aox4. PERRLA.

## 2016-02-05 ENCOUNTER — Observation Stay (HOSPITAL_COMMUNITY): Payer: Medicaid Other

## 2016-02-05 DIAGNOSIS — R55 Syncope and collapse: Secondary | ICD-10-CM | POA: Diagnosis present

## 2016-02-05 DIAGNOSIS — D649 Anemia, unspecified: Secondary | ICD-10-CM | POA: Diagnosis present

## 2016-02-05 DIAGNOSIS — Z79899 Other long term (current) drug therapy: Secondary | ICD-10-CM | POA: Diagnosis not present

## 2016-02-05 DIAGNOSIS — I499 Cardiac arrhythmia, unspecified: Secondary | ICD-10-CM | POA: Diagnosis present

## 2016-02-05 DIAGNOSIS — E663 Overweight: Secondary | ICD-10-CM | POA: Diagnosis present

## 2016-02-05 DIAGNOSIS — Z87891 Personal history of nicotine dependence: Secondary | ICD-10-CM | POA: Diagnosis not present

## 2016-02-05 DIAGNOSIS — E162 Hypoglycemia, unspecified: Secondary | ICD-10-CM | POA: Diagnosis present

## 2016-02-05 DIAGNOSIS — I10 Essential (primary) hypertension: Secondary | ICD-10-CM | POA: Diagnosis present

## 2016-02-05 DIAGNOSIS — M6281 Muscle weakness (generalized): Secondary | ICD-10-CM

## 2016-02-05 DIAGNOSIS — G43909 Migraine, unspecified, not intractable, without status migrainosus: Secondary | ICD-10-CM | POA: Diagnosis present

## 2016-02-05 DIAGNOSIS — K219 Gastro-esophageal reflux disease without esophagitis: Secondary | ICD-10-CM | POA: Diagnosis present

## 2016-02-05 DIAGNOSIS — Z6826 Body mass index (BMI) 26.0-26.9, adult: Secondary | ICD-10-CM | POA: Diagnosis not present

## 2016-02-05 LAB — URINALYSIS, ROUTINE W REFLEX MICROSCOPIC
Bilirubin Urine: NEGATIVE
Glucose, UA: NEGATIVE mg/dL
KETONES UR: NEGATIVE mg/dL
LEUKOCYTES UA: NEGATIVE
NITRITE: NEGATIVE
PROTEIN: NEGATIVE mg/dL
Specific Gravity, Urine: 1.015 (ref 1.005–1.030)
pH: 6.5 (ref 5.0–8.0)

## 2016-02-05 LAB — BASIC METABOLIC PANEL
ANION GAP: 6 (ref 5–15)
BUN: 6 mg/dL (ref 6–20)
CALCIUM: 8.5 mg/dL — AB (ref 8.9–10.3)
CO2: 21 mmol/L — ABNORMAL LOW (ref 22–32)
Chloride: 110 mmol/L (ref 101–111)
Creatinine, Ser: 0.67 mg/dL (ref 0.44–1.00)
Glucose, Bld: 86 mg/dL (ref 65–99)
POTASSIUM: 3.6 mmol/L (ref 3.5–5.1)
Sodium: 137 mmol/L (ref 135–145)

## 2016-02-05 LAB — RAPID URINE DRUG SCREEN, HOSP PERFORMED
AMPHETAMINES: NOT DETECTED
BENZODIAZEPINES: NOT DETECTED
Barbiturates: NOT DETECTED
Cocaine: NOT DETECTED
OPIATES: NOT DETECTED
Tetrahydrocannabinol: POSITIVE — AB

## 2016-02-05 LAB — CBC
HCT: 32.8 % — ABNORMAL LOW (ref 36.0–46.0)
HEMOGLOBIN: 10.5 g/dL — AB (ref 12.0–15.0)
MCH: 25.7 pg — ABNORMAL LOW (ref 26.0–34.0)
MCHC: 32 g/dL (ref 30.0–36.0)
MCV: 80.2 fL (ref 78.0–100.0)
Platelets: 314 10*3/uL (ref 150–400)
RBC: 4.09 MIL/uL (ref 3.87–5.11)
RDW: 16.1 % — ABNORMAL HIGH (ref 11.5–15.5)
WBC: 5.3 10*3/uL (ref 4.0–10.5)

## 2016-02-05 LAB — URINE MICROSCOPIC-ADD ON
RBC / HPF: NONE SEEN RBC/hpf (ref 0–5)
WBC, UA: NONE SEEN WBC/hpf (ref 0–5)

## 2016-02-05 LAB — GLUCOSE, CAPILLARY: Glucose-Capillary: 93 mg/dL (ref 65–99)

## 2016-02-05 MED ORDER — K PHOS MONO-SOD PHOS DI & MONO 155-852-130 MG PO TABS
250.0000 mg | ORAL_TABLET | Freq: Every day | ORAL | Status: DC
  Administered 2016-02-05 – 2016-02-06 (×2): 250 mg via ORAL
  Filled 2016-02-05 (×2): qty 1

## 2016-02-05 NOTE — Plan of Care (Signed)
Problem: Safety: Goal: Ability to remain free from injury will improve Outcome: Completed/Met Date Met: 02/05/16 Patient uses call light appropriately and calls when she needs assistance as per instructions.

## 2016-02-05 NOTE — Progress Notes (Signed)
Family Medicine Teaching Service Daily Progress Note Intern Pager: [phone]  Patient name: Susan Sanders Medical record number: [id number] Date of birth: [birth year] Age: 45 y.o. Gender: female  Primary Care Provider: Smiley Houseman, MD Consultants: None Code Status: FULL  Pt Overview and Major Events to Date:  9/23:  Admit for syncope with fall w/o LOC  Assessment and Plan: Susan Sanders is a 45 y.o. female presenting with syncope. PMH is significant for HTN, dysfunctional uterine bleeding.  Syncope with fall: Without LOC or neuro sx. Persistent headache present, but improved from fall. No other injuries. Afebrile, no signs of infection. CXR without acute process. CT head without acute process. Differential includes hypoglycemia due to no breakfast, hypertension, arrhythmia. iStat trop neg. Less likely etiologies include seizure and stroke. UA neg. - Observation on telemetry - Repeat EKG pending - CBC, BMP - Orthostatics pending - ECHO pending - Carotid doppler pending - UDS pending  Hypertension: Pt takes amlodipine 10mg  at home. Did not take it the morning of admission. Systolic BP of [id number] per EMS. Improved to 130s on admission. Continues to be 140s/70s. - Norvasc 10 mg daily - PRN hydralazine SBP >160 / DBP >110   Headache: Likely secondary to fall. CT head without acute process. No open wounds to posterior occiput.  - Monitor pain, resolved right-sided migraine, does endorse improved right occipital pain behind ear - PRN tylenol, tramadol  Anemia: hgb down 10.5, baseline around 11.3. Elevated RDW with normal MCV.  - Continue monitoring hgb  FEN/GI: heart healthy diet, home zantac Prophylaxis: Lovenox  Disposition:  observation on tele, pending syncope work-up  Subjective:  Afebrile and stable with improved BP in 140/70. Says migraine headache resolved with minimal and improved pain behind right ear where she fell.  Objective: Temp:  [98.3 F (36.8 C)-98.9 F (37.2  C)] 98.3 F (36.8 C) (09/24 0349) Pulse Rate:  [70-89] 72 (09/24 0349) Resp:  [14-19] 19 (09/24 0349) BP: (138-169)/(76-93) 142/77 (09/24 0349) SpO2:  [99 %-100 %] 100 % (09/24 0349) Weight:  [160 lb (72.6 kg)-168 lb 14.4 oz (76.6 kg)] 168 lb 14.4 oz (76.6 kg) (09/24 0349) Physical Exam: General: Overweight female lying in bed.  HEENT: moist mucous membranes, EOMI, PERRLA, no photosensitivity, minimal tenderness behind right ear w/o edema or signs of laceration Cardiovascular: rrr, no m/r/g Respiratory: CTAB, good air movemvent Gastrointestinal: SNTND, +BS MSK: 5/5 strength in all extremities Derm: no rashes or wounds on exposed skin Neuro: CN II-XII, 5/5 strength in all extremities, sensation in tact Psych: mood appropriate, flat affect  Laboratory:  Recent Labs Lab 02/04/16 1450 02/05/16 0440  WBC 4.8 5.3  HGB 11.6* 10.5*  HCT 36.4 32.8*  PLT 325 314    Recent Labs Lab 02/04/16 1450 02/05/16 0440  NA 140 137  K 3.8 3.6  CL 111 110  CO2 19* 21*  BUN 8 6  CREATININE 0.82 0.67  CALCIUM 9.3 8.5*  PROT 7.1  --   BILITOT 0.6  --   ALKPHOS 40  --   ALT 14  --   AST 22  --   GLUCOSE 80 86    Imaging/Diagnostic Tests: Dg Chest 2 View Result Date: 02/04/2016 CLINICAL DATA:  Fall.  Hypertension.  Ex-smoker. EXAM: CHEST  2 VIEW COMPARISON:  01/28/2016 FINDINGS: Lateral view degraded by patient arm position. S-shaped thoracic spine curvature. Patient rotated right on the frontal. Midline trachea. Normal heart size and mediastinal contours. No pleural effusion or pneumothorax. Clear lungs. IMPRESSION: No acute  cardiopulmonary disease. Electronically Signed   By: Abigail Miyamoto M.D.   On: 02/04/2016 15:34   Ct Head Wo Contrast Result Date: 02/04/2016 CLINICAL DATA:  Loss of consciousness, hit back of head. EXAM: CT HEAD WITHOUT CONTRAST TECHNIQUE: Contiguous axial images were obtained from the base of the skull through the vertex without intravenous contrast. COMPARISON:   04/29/2011 CT head FINDINGS: Brain: No evidence of acute infarction, hemorrhage, hydrocephalus, extra-axial collection or mass lesion/mass effect. Vascular: Unremarkable Skull: Normal. Negative for fracture or focal lesion. Sinuses/Orbits: Unremarkable Other: No scalp hematoma, soft tissue induration nor radiopaque foreign bodies. IMPRESSION: Normal head CT. Electronically Signed   By: Ashley Royalty M.D.   On: 02/04/2016 16:20     Fredonia Bing, DO 02/05/2016, 7:04 AM PGY-1, [place] Intern pager: [phone], text pages welcome

## 2016-02-05 NOTE — Progress Notes (Signed)
Pt experienced an episode of presyncope while she was been carried to the have carotid doppler done. She said it felt like when she passed out yesterday.  Pt now back in bed BP 164/ 95 pulse 85, sat 100 % on nroom ai tem. 98.0 Ferdinand Lango, RN.

## 2016-02-05 NOTE — Progress Notes (Signed)
VASCULAR LAB PRELIMINARY  PRELIMINARY  PRELIMINARY  PRELIMINARY  Carotid duplex completed.    Preliminary report:  1-39% ICA plaquing.  Vertebral artery flow is antegrade.   Maximina Pirozzi, RVT 02/05/2016, 5:27 PM

## 2016-02-06 ENCOUNTER — Inpatient Hospital Stay (HOSPITAL_COMMUNITY): Payer: Medicaid Other

## 2016-02-06 DIAGNOSIS — R55 Syncope and collapse: Secondary | ICD-10-CM

## 2016-02-06 DIAGNOSIS — M6281 Muscle weakness (generalized): Secondary | ICD-10-CM

## 2016-02-06 LAB — VAS US CAROTID
LCCADDIAS: -32 cm/s
LCCADSYS: -93 cm/s
LEFT ECA DIAS: -11 cm/s
LEFT VERTEBRAL DIAS: -25 cm/s
LICADDIAS: -48 cm/s
LICADSYS: -120 cm/s
LICAPDIAS: -34 cm/s
LICAPSYS: -88 cm/s
Left CCA prox dias: 34 cm/s
Left CCA prox sys: 123 cm/s
RCCADSYS: -121 cm/s
RCCAPSYS: 97 cm/s
RIGHT ECA DIAS: -19 cm/s
RIGHT VERTEBRAL DIAS: -9 cm/s
Right CCA prox dias: 27 cm/s

## 2016-02-06 LAB — GLUCOSE, CAPILLARY: Glucose-Capillary: 87 mg/dL (ref 65–99)

## 2016-02-06 LAB — CBC
HEMATOCRIT: 34.7 % — AB (ref 36.0–46.0)
Hemoglobin: 11.1 g/dL — ABNORMAL LOW (ref 12.0–15.0)
MCH: 25.4 pg — AB (ref 26.0–34.0)
MCHC: 32 g/dL (ref 30.0–36.0)
MCV: 79.4 fL (ref 78.0–100.0)
PLATELETS: 326 10*3/uL (ref 150–400)
RBC: 4.37 MIL/uL (ref 3.87–5.11)
RDW: 16.1 % — AB (ref 11.5–15.5)
WBC: 5.4 10*3/uL (ref 4.0–10.5)

## 2016-02-06 LAB — PHOSPHORUS: Phosphorus: 2.4 mg/dL — ABNORMAL LOW (ref 2.5–4.6)

## 2016-02-06 MED ORDER — K PHOS MONO-SOD PHOS DI & MONO 155-852-130 MG PO TABS
250.0000 mg | ORAL_TABLET | Freq: Every day | ORAL | Status: AC
  Administered 2016-02-06: 250 mg via ORAL
  Filled 2016-02-06: qty 1

## 2016-02-06 NOTE — Progress Notes (Signed)
  Echocardiogram 2D Echocardiogram has been performed.  Susan Sanders 02/06/2016, 4:55 PM

## 2016-02-06 NOTE — Evaluation (Signed)
Physical Therapy Evaluation & Discharge Patient Details Name: Susan Sanders MRN: [id number] DOB: 1970-12-10 Today's Date: 02/06/2016   History of Present Illness  Susan Sanders is a 45 y.o. female presenting with syncope. PMH is significant for HTN, dysfunctional uterine bleeding.  Clinical Impression  Patient presents close to functional baseline and without any further skilled PT needs at this time.  Educated on gradual return to activities and for clearance from MD for working and exercise activities.  Will sign off.    Follow Up Recommendations No PT follow up    Equipment Recommendations  None recommended by PT    Recommendations for Other Services       Precautions / Restrictions Precautions Precautions: None      Mobility  Bed Mobility Overal bed mobility: Independent                Transfers Overall transfer level: Independent                  Ambulation/Gait Ambulation/Gait assistance: Independent Ambulation Distance (Feet): 200 Feet Assistive device: None Gait Pattern/deviations: WFL(Within Functional Limits)        Stairs Stairs: Yes Stairs assistance: Modified independent (Device/Increase time) Stair Management: One rail Left;Forwards;Alternating pattern Number of Stairs: 15 General stair comments: HR max 113; initially guarding assist for safety, then without any support  Wheelchair Mobility    Modified Rankin (Stroke Patients Only)       Balance Overall balance assessment: Independent                                           Pertinent Vitals/Pain Pain Assessment: No/denies pain    Home Living Family/patient expects to be discharged to:: Private residence Living Arrangements: Children;Spouse/significant other Available Help at Discharge: Family Type of Home: Apartment Home Access: Stairs to enter Entrance Stairs-Rails: Left Entrance Stairs-Number of Steps: flight; lives on second [place]: One  level [place]: Grab bars - tub/shower      Prior Function Level of Independence: Independent         Comments: 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        Extremity/Trunk Assessment   Upper Extremity Assessment: Overall [facility] for tasks assessed           Lower Extremity Assessment: Overall WFL for tasks assessed         Communication   Communication: No difficulties  Cognition Arousal/Alertness: Awake/alert Behavior During Therapy: WFL for tasks assessed/performed Overall Cognitive Status: Within Functional Limits for tasks assessed                      General Comments General comments (skin integrity, edema, etc.): Educated patient in energy conservation    Exercises     Assessment/Plan    PT Assessment [place] does not need any further PT services  PT Problem List            PT Treatment Interventions      PT Goals (Current goals can be found in the Care Plan section)  Acute Rehab PT Goals PT Goal Formulation: All assessment and education complete, DC therapy    Frequency     Barriers to discharge        Co-evaluation  End of Session   Activity Tolerance: Patient tolerated treatment well Patient left: in bed;with call bell/phone within reach;with family/visitor present           Time: [id number] PT Time Calculation (min) (ACUTE ONLY): 13 min   Charges:   PT Evaluation $PT Eval Low Complexity: 1 Procedure     PT G CodesReginia Naas 02/17/16, 2:10 PM  Magda Kiel, [place] 02/17/2016

## 2016-02-06 NOTE — Progress Notes (Signed)
Family Medicine Teaching Service Daily Progress Note Intern Pager: [phone]  Patient name: Susan Sanders Medical record number: [id number] Date of birth: Apr 07, 1971 Age: 45 y.o. Gender: female  Primary Care Provider: Smiley Houseman, MD Consultants: None Code Status: FULL  Pt Overview and Major Events to Date:  9/23:  Admit for syncope with fall w/o LOC  Assessment and Plan: Susan Sanders is a 45 y.o. female presenting with syncope. PMH is significant for HTN, dysfunctional uterine bleeding.  1. Syncope with fall: Without LOC or neuro sx. Persistent headache present, but improved from fall. No other injuries. Afebrile, no signs of infection. CXR without acute process. CT head without acute process. Differential includes hypoglycemia due to no breakfast, hypertension, and arrhythmia. iStat trop neg. Less likely etiologies include seizure and stroke. UA neg. Carotids neg for significant stenosis with mild soft plaque. Orthostatics pos on 9/24 with BP sitting 152/107 and standing 138/91 with a drop diastolic of 16. - Observation on telemetry - CBC, BMP - ECHO pending - Nutrition outpatient due to concerns of hypoglycemia - Carotid doppler summary showed increased diastole pressure in ICA, will curbside vascular for interpretation - PT consult pending  2. Hypertension: Pt takes amlodipine 10mg  at home. Did not take it the morning of admission. Systolic BP of [id number] per EMS. Improved to 130s on admission. Continues to be 140s/80s. - Norvasc 10 mg daily - PRN hydralazine SBP >160 / DBP >110  - Will discontinue IV fluids 9/25  3. Headache: resolved, r-sided migraine, likely secondary to traumatic fall. CT head without acute process. No open wounds to posterior occiput.  - PRN tylenol, tramadol  4. Anemia: improved, hgb up 11.1, baseline around 11.3. Elevated RDW with normal MCV.  - Continue monitoring hgb  FEN/GI: heart healthy diet, home zantac Prophylaxis: Lovenox  Disposition:   observation on tele, pending ECHO   Subjective:  Afebrile and stable. BP remains stable and improved at 140s/80s. CBG 87 this AM. Said she feels well without complaints this AM. Says she had some feelings of lightheadedness yesterday while getting her carotid doppler study but resolved with taking her amlodipine. Says h/a has resolved.  Objective: Temp:  [97.8 F (36.6 C)-98.7 F (37.1 C)] 97.8 F (36.6 C) (09/25 0400) Pulse Rate:  [68-87] 69 (09/25 0400) Resp:  [16-18] 18 (09/25 0400) BP: (130-164)/(79-107) 148/87 (09/25 0400) SpO2:  [100 %] 100 % (09/25 0400) Weight:  [165 lb 11.2 oz (75.2 kg)] 165 lb 11.2 oz (75.2 kg) (09/25 0400) Physical Exam: General: Overweight female lying in bed.  HEENT: moist mucous membranes, EOMI, PERRLA, no photosensitivity, atraumatic without signs of lacteration Cardiovascular: rrr, no m/r/g Respiratory: CTAB, good air movemvent Gastrointestinal: SNTND, +BS MSK: 5/5 strength in all extremities Derm: no rashes or wounds on exposed skin Neuro: CN II-XII, 5/5 strength in all extremities, sensation in tact Psych: mood appropriate, flat affect  Laboratory:  Recent Labs Lab 02/04/16 1450 02/05/16 0440 02/06/16 0517  WBC 4.8 5.3 5.4  HGB 11.6* 10.5* 11.1*  HCT 36.4 32.8* 34.7*  PLT 325 314 326    Recent Labs Lab 02/04/16 1450 02/05/16 0440  NA 140 137  K 3.8 3.6  CL 111 110  CO2 19* 21*  BUN 8 6  CREATININE 0.82 0.67  CALCIUM 9.3 8.5*  PROT 7.1  --   BILITOT 0.6  --   ALKPHOS 40  --   ALT 14  --   AST 22  --   GLUCOSE 80 86    Imaging/Diagnostic  Tests: Dg Chest 2 View Result Date: 02/04/2016 CLINICAL DATA:  Fall.  Hypertension.  Ex-smoker. EXAM: CHEST  2 VIEW COMPARISON:  01/28/2016 FINDINGS: Lateral view degraded by patient arm position. S-shaped thoracic spine curvature. Patient rotated right on the frontal. Midline trachea. Normal heart size and mediastinal contours. No pleural effusion or pneumothorax. Clear lungs. IMPRESSION:  No acute cardiopulmonary disease. Electronically Signed   By: Abigail Miyamoto M.D.   On: 02/04/2016 15:34   Ct Head Wo Contrast Result Date: 02/04/2016 CLINICAL DATA:  Loss of consciousness, hit back of head. EXAM: CT HEAD WITHOUT CONTRAST TECHNIQUE: Contiguous axial images were obtained from the base of the skull through the vertex without intravenous contrast. COMPARISON:  04/29/2011 CT head FINDINGS: Brain: No evidence of acute infarction, hemorrhage, hydrocephalus, extra-axial collection or mass lesion/mass effect. Vascular: Unremarkable Skull: Normal. Negative for fracture or focal lesion. Sinuses/Orbits: Unremarkable Other: No scalp hematoma, soft tissue induration nor radiopaque foreign bodies. IMPRESSION: Normal head CT. Electronically Signed   By: Ashley Royalty M.D.   On: 02/04/2016 16:20   VAS US CAROTID DUPLEX BILATERAL Study date: 02/05/2016 Summary: Bilateral: intimal wall thickening CCA. Mild soft plaque origin and throughout ICA and ECA. 1-395 ICA plaquing. Diastolic velocities are elevated in the distal ICAs, etiology unknown. Vertebral artery flow is antegrade.  ECHO  Pending    Puhi Bing, DO 02/06/2016, 7:31 AM PGY-1, [place] Intern pager: [phone], text pages welcome

## 2016-02-07 LAB — CBC
HCT: 37.5 % (ref 36.0–46.0)
Hemoglobin: 11.8 g/dL — ABNORMAL LOW (ref 12.0–15.0)
MCH: 25.2 pg — AB (ref 26.0–34.0)
MCHC: 31.5 g/dL (ref 30.0–36.0)
MCV: 80.1 fL (ref 78.0–100.0)
PLATELETS: 366 10*3/uL (ref 150–400)
RBC: 4.68 MIL/uL (ref 3.87–5.11)
RDW: 15.6 % — AB (ref 11.5–15.5)
WBC: 5.2 10*3/uL (ref 4.0–10.5)

## 2016-02-07 LAB — BASIC METABOLIC PANEL
ANION GAP: 6 (ref 5–15)
BUN: 8 mg/dL (ref 6–20)
CALCIUM: 9.7 mg/dL (ref 8.9–10.3)
CO2: 25 mmol/L (ref 22–32)
CREATININE: 0.76 mg/dL (ref 0.44–1.00)
Chloride: 105 mmol/L (ref 101–111)
GFR calc Af Amer: >60 mL/min (ref >60)
GLUCOSE: 82 mg/dL (ref 65–99)
Potassium: 4.1 mmol/L (ref 3.5–5.1)
Sodium: 136 mmol/L (ref 135–145)

## 2016-02-07 LAB — GLUCOSE, CAPILLARY: Glucose-Capillary: 88 mg/dL (ref 65–99)

## 2016-02-07 LAB — ECHOCARDIOGRAM COMPLETE
Height: 65 in
Weight: 2651.2 oz

## 2016-02-07 LAB — PHOSPHORUS: Phosphorus: 3.5 mg/dL (ref 2.5–4.6)

## 2016-02-07 MED ORDER — INFLUENZA VAC SPLIT QUAD 0.5 ML IM SUSY: 0.5000 mL | PREFILLED_SYRINGE | INTRAMUSCULAR | Status: DC

## 2016-02-07 MED ORDER — INFLUENZA VAC SPLIT QUAD 0.5 ML IM SUSY
0.5000 mL | PREFILLED_SYRINGE | INTRAMUSCULAR | Status: DC
  Filled 2016-02-07: qty 0.5

## 2016-02-07 NOTE — Progress Notes (Signed)
Discussed with vascular on 9/25 concerning carotid doppler studies which read, "diastolic velocities are elevated in the distal ICAs, etiology unknown." Vascular said this type of doppler is great for the carotid bifurcations but poor at evaluating flow distal to this. Vascular said could consider a CTA but did not believe this would be of value due to age and condition. Likely not related to her syncopal episode. Appreciate vascular recommendations. -- Harriet [place], [place], PGY-1

## 2016-02-07 NOTE — Discharge Instructions (Addendum)
° °  Near-Syncope Near-syncope (commonly known as near fainting) is sudden weakness, dizziness, or feeling like you might pass out. During an episode of near-syncope, you may also develop pale skin, have tunnel vision, or feel sick to your stomach (nauseous). Near-syncope may occur when getting up after sitting or while standing for a long time. It is caused by a sudden decrease in blood flow to the brain. This decrease can result from various causes or triggers, most of which are not serious. However, because near-syncope can sometimes be a sign of something serious, a medical evaluation is required. The specific cause is often not determined. HOME CARE INSTRUCTIONS  Monitor your condition for any changes. The following actions may help to alleviate any discomfort you are experiencing:  Have someone stay with you until you feel stable.  Lie down right away and prop your feet up if you start feeling like you might faint. Breathe deeply and steadily. Wait until all the symptoms have passed. Most of these episodes last only a few minutes. You may feel tired for several hours.   Drink enough fluids to keep your urine clear or pale yellow.   If you are taking blood pressure or heart medicine, get up slowly when seated or lying down. Take several minutes to sit and then stand. This can reduce dizziness.  Follow up with your health care provider as directed. SEEK IMMEDIATE MEDICAL CARE IF:   You have a severe headache.   You have unusual pain in the chest, abdomen, or back.   You are bleeding from the mouth or rectum, or you have black or tarry stool.   You have an irregular or very fast heartbeat.   You have repeated fainting or have seizure-like jerking during an episode.   You faint when sitting or lying down.   You have confusion.   You have difficulty walking.   You have severe weakness.   You have vision problems.  MAKE SURE YOU:   Understand these  instructions.  Will watch your condition.  Will get help right away if you are not doing well or get worse.   This information is not intended to replace advice given to you by your health care provider. Make sure you discuss any questions you have with your health care provider.   Document Released: 04/30/2005 Document Revised: 05/05/2013 Document Reviewed: 10/03/2012 Elsevier Interactive Patient Education 2016 [place]. [place], you were admitted for having an episode of syncope where you passed out. We think this was related to either not taking your medications or having low blood sugars.  We performed several studies all of which were done in order to determine source of passing out but none showed [place] a reason to believe you passed out.  Please continue taking your home medications as prescribed and follow up with our clinic on 10/2 at 2:30 PM.  PLEASE DO NOT DRIVE FOR 6 MONTHS, unless cleared by your PCP after your visit.

## 2016-02-07 NOTE — Progress Notes (Signed)
Pt has an allergy to latex. Pt sated she wanted to wait until her follow up doctors appointment to get the flu vaccine. Echo results pending. Clarified with MD ok to discharge. Discharge education reviewed with patient. Patient has no questions at this time. IV dc. Pt discharged home with daughter.

## 2016-02-07 NOTE — Progress Notes (Signed)
Family Medicine Teaching Service Daily Progress Note Intern Pager: [phone]  Patient name: Susan Sanders Medical record number: [id number] Date of birth: [birth year] Age: 45 y.o. Gender: female  Primary Care Provider: Smiley Houseman, MD Consultants: None Code Status: FULL  Pt Overview and Major Events to Date:  9/23:  Admit for syncope with fall w/o LOC, CT neg for fx or focal lesions 9/24:  Vascular doppler of lower extremities neg for DVT 9/26:  ECHO results pending, anticipate d/c to SNF  Assessment and Plan: Susan Sanders is a 45 y.o. female presenting with syncope. PMH is significant for HTN, dysfunctional uterine bleeding.  1. Syncope with fall: Without LOC or neuro sx. Persistent headache present, but improved from fall. No other injuries. Afebrile, no signs of infection. CXR without acute process. CT head without acute process. Differential includes hypoglycemia due to no breakfast, hypertension, and arrhythmia. iStat trop neg. Less likely etiologies include seizure and stroke. UA neg. Carotids neg for significant stenosis with mild soft plaque. Orthostatics normal response as of 9/25. Carotid doppler summary showed increased diastole pressure in ICA, vascular rec CTA but not likely to show anything given age and condition. PT did not recommend PT f/u outpatient. - Observation on telemetry - CBC, BMP - ECHO results pending - Nutrition outpatient due to concerns of hypoglycemia  2. Hypertension: Pt takes amlodipine 10mg  at home. Did not take it the morning of admission. Systolic BP of [id number] per EMS. Improved to 130s on admission. Continues to be 140s/80s. - Norvasc 10 mg daily - PRN hydralazine SBP >160 / DBP >110  - Will discontinue IV fluids 9/25  3. Headache: resolved, r-sided migraine, likely secondary to traumatic fall. CT head without acute process. No open wounds to posterior occiput.  - PRN tylenol, tramadol  4. Anemia: improved, hgb up 11.1, baseline around 11.3.  Elevated RDW with normal MCV.  - Continue monitoring hgb  FEN/GI: heart healthy diet, home zantac Prophylaxis: Lovenox  Disposition:  observation on tele, pending ECHO   Subjective:  Afebrile and stable. BP remains stable and improved at 140s/80s. CBG 88 this AM. Said she feels well without complaints this AM. Says no feelings of lightheadedness as of yesterday. Says h/a has resolved.   Objective: Temp:  [98 F (36.7 C)-98.5 F (36.9 C)] 98.4 F (36.9 C) (09/26 0400) Pulse Rate:  [65-85] 85 (09/26 0400) Resp:  [13-19] 14 (09/26 0520) BP: (132-165)/(72-105) 134/86 (09/26 0520) SpO2:  [97 %-100 %] 99 % (09/26 0400) Weight:  [164 lb 3.2 oz (74.5 kg)] 164 lb 3.2 oz (74.5 kg) (09/26 0400) Physical Exam: General: Overweight female lying in bed.  HEENT: moist mucous membranes, EOMI, PERRLA, no photosensitivity, atraumatic without signs of lacteration Cardiovascular: rrr, no m/r/g Respiratory: CTAB, good air movemvent Gastrointestinal: SNTND, +BS MSK: 5/5 strength in all extremities Derm: no rashes or wounds on exposed skin Neuro: CN II-XII, 5/5 strength in all extremities, sensation in tact Psych: mood appropriate, flat affect  Laboratory:  Recent Labs Lab 02/05/16 0440 02/06/16 0517 02/07/16 0415  WBC 5.3 5.4 5.2  HGB 10.5* 11.1* 11.8*  HCT 32.8* 34.7* 37.5  PLT 314 326 366    Recent Labs Lab 02/04/16 1450 02/05/16 0440 02/07/16 0415  NA 140 137 136  K 3.8 3.6 4.1  CL 111 110 105  CO2 19* 21* 25  BUN 8 6 8   CREATININE 0.82 0.67 0.76  CALCIUM 9.3 8.5* 9.7  PROT 7.1  --   --   BILITOT 0.6  --   --  ALKPHOS 40  --   --   ALT 14  --   --   AST 22  --   --   GLUCOSE 80 86 82    Imaging/Diagnostic Tests: Dg Chest 2 View Result Date: 02/04/2016 CLINICAL DATA:  Fall.  Hypertension.  Ex-smoker. EXAM: CHEST  2 VIEW COMPARISON:  01/28/2016 FINDINGS: Lateral view degraded by patient arm position. S-shaped thoracic spine curvature. Patient rotated right on the  frontal. Midline trachea. Normal heart size and mediastinal contours. No pleural effusion or pneumothorax. Clear lungs. IMPRESSION: No acute cardiopulmonary disease. Electronically Signed   By: Abigail Miyamoto M.D.   On: 02/04/2016 15:34   Ct Head Wo Contrast Result Date: 02/04/2016 CLINICAL DATA:  Loss of consciousness, hit back of head. EXAM: CT HEAD WITHOUT CONTRAST TECHNIQUE: Contiguous axial images were obtained from the base of the skull through the vertex without intravenous contrast. COMPARISON:  04/29/2011 CT head FINDINGS: Brain: No evidence of acute infarction, hemorrhage, hydrocephalus, extra-axial collection or mass lesion/mass effect. Vascular: Unremarkable Skull: Normal. Negative for fracture or focal lesion. Sinuses/Orbits: Unremarkable Other: No scalp hematoma, soft tissue induration nor radiopaque foreign bodies. IMPRESSION: Normal head CT. Electronically Signed   By: Ashley Royalty M.D.   On: 02/04/2016 16:20   VAS US CAROTID DUPLEX BILATERAL Study date: 02/05/2016 Summary: Bilateral: intimal wall thickening CCA. Mild soft plaque origin and throughout ICA and ECA. 1-395 ICA plaquing. Diastolic velocities are elevated in the distal ICAs, etiology unknown. Vertebral artery flow is antegrade.  ECHO  Pending    [place] Bing, DO 02/07/2016, 7:22 AM PGY-1, [place] Intern pager: [phone], text pages welcome

## 2016-02-07 NOTE — Discharge Summary (Signed)
[facility] Discharge Summary  Patient name: Susan Sanders Medical record number: [id number] Date of birth: 05-Aug-1970 Age: 45 y.o. Gender: female Date of Admission: 02/04/2016  Date of Discharge: 02/07/2016 Admitting Physician: Dickie La, MD  Primary Care Provider: Smiley Houseman, MD Consultants: none  Indication for Hospitalization: syncope  Discharge Diagnoses/Problem List:  Syncope with fall Hypertension Headache Anemia  Disposition: home, no PT needed  Discharge Condition: stable, improved  Discharge Exam:  General: Overweight female lying in bed.  HEENT: moist mucous membranes, EOMI, PERRLA, no photosensitivity, atraumatic without signs of lacteration Cardiovascular: rrr, no m/r/g Respiratory: CTAB, good air movemvent Gastrointestinal: SNTND, +BS MSK: 5/5 strength in all extremities Derm: no rashes or wounds on exposed skin Neuro: CN II-XII, 5/5 strength in all extremities, sensation in tact Psych: mood appropriate, flat affect  Brief Hospital Course:  Susan Sanders is a 45 y.o. female presenting with syncope. PMH is significant for HTN, dysfunctional uterine bleeding.  Patient experienced syncopal episode with a fall and sustained trauma to the right occiput on the dirt ground on 02/04/16. Fall was witness by daughter who noted patient never lost conciseness. See H&P for more detail.  CT showed no acute processes. Patient was followed by neurology. Headache resolved during admission. Etiology was not certain but seizure and stroke seemed unlikely. Hypoglycemia, arrhythmia, or hypertension seemed likely culprit. UA neg for infection and troponin was neg. Patient initially presented with systolics in the [id number] via EMS but became normotensive by admission. ECHO and carotids neg for abnormalities concerning for syncope. Orthostatics were unremarkable.. Patient was also found to be anemic with stable hgb.   Patient was also told not to drive for  the next 6 months. Patient was discharged on 02/07/16 with PCP follow up.  Issues for Follow Up:  1. Patient needs out patient nutrition follow up for concerns of hypoglycemia 2. Underlying anemia with elevated RDW and normal MCV concerning for iron deficiency anemia, consider further evaluation and iron supplements 3. Patient is to not drive for 6 months due to syncopal episode  Significant Procedures: None  Significant Labs and Imaging:   Recent Labs Lab 02/05/16 0440 02/06/16 0517 02/07/16 0415  WBC 5.3 5.4 5.2  HGB 10.5* 11.1* 11.8*  HCT 32.8* 34.7* 37.5  PLT 314 326 366    Recent Labs Lab 02/04/16 1450 02/04/16 1948 02/05/16 0440 02/06/16 1044 02/07/16 0415  NA 140  --  137  --  136  K 3.8  --  3.6  --  4.1  CL 111  --  110  --  105  CO2 19*  --  21*  --  25  GLUCOSE 80  --  86  --  82  BUN 8  --  6  --  8  CREATININE 0.82  --  0.67  --  0.76  CALCIUM 9.3  --  8.5*  --  9.7  MG  --  1.7  --   --   --   PHOS  --  2.2*  --  2.4* 3.5  ALKPHOS 40  --   --   --   --   AST 22  --   --   --   --   ALT 14  --   --   --   --   ALBUMIN 3.6  --   --   --   --    Dg Chest 2 View Result Date: 02/04/2016 CLINICAL DATA: Fall. Hypertension.  Ex-smoker. EXAM: CHEST 2 VIEW COMPARISON: 01/28/2016 FINDINGS: Lateral view degraded by patient arm position. S-shaped thoracic spine curvature. Patient rotated right on the frontal. Midline trachea. Normal heart size and mediastinal contours. No pleural effusion or pneumothorax. Clear lungs. IMPRESSION: No acute cardiopulmonary disease. Electronically Signed By: Abigail Miyamoto M.D. On: 02/04/2016 15:34   Ct Head Wo Contrast Result Date: 02/04/2016 CLINICAL DATA: Loss of consciousness, hit back of head. EXAM: CT HEAD WITHOUT CONTRAST TECHNIQUE: Contiguous axial images were obtained from the base of the skull through the vertex without intravenous contrast. COMPARISON: 04/29/2011 CT head FINDINGS: Brain: No evidence of acute  infarction, hemorrhage, hydrocephalus, extra-axial collection or mass lesion/mass effect. Vascular: Unremarkable Skull: Normal. Negative for fracture or focal lesion. Sinuses/Orbits: Unremarkable Other: No scalp hematoma, soft tissue induration nor radiopaque foreign bodies. IMPRESSION: Normal head CT. Electronically Signed By: Ashley Royalty M.D. On: 02/04/2016 16:20   VAS US CAROTID DUPLEX BILATERAL Study date: 02/05/2016 Summary: Bilateral: intimal wall thickening CCA. Mild soft plaque origin and throughout ICA and ECA. 1-395 ICA plaquing. Diastolic velocities are elevated in the distal ICAs, etiology unknown. Vertebral artery flow is antegrade.  ECHO  Study Conclusions  - Left ventricle: The cavity size was normal. Wall thickness was   increased in a pattern of mild LVH. Systolic function was normal.   The estimated ejection fraction was in the range of 55% to 60%.   Wall motion was normal; there were no regional wall motion   abnormalities. Left ventricular diastolic function parameters   were normal. - Aortic valve: There was no stenosis. - Mitral valve: There was trivial regurgitation. - Right ventricle: The cavity size was normal. Systolic function   was normal. - Pulmonary arteries: No complete TR doppler jet so unable to   estimate PA systolic pressure.  Impressions:  - Normal study.  Results/Tests Pending at Time of Discharge: None  Discharge Medications:    Medication List    TAKE these medications   amLODipine 10 MG tablet Commonly known as:  NORVASC Take 1 tablet (10 mg total) by mouth daily.   multivitamin with minerals tablet Take 1 tablet by mouth daily.   ranitidine 150 MG tablet Commonly known as:  ZANTAC Take 1 tablet (150 mg total) by mouth 2 (two) times daily.       Discharge Instructions: Please refer to Patient Instructions section of EMR for full details.  Patient was counseled important signs and symptoms that should prompt return to  medical care, changes in medications, dietary instructions, activity restrictions, and follow up appointments.   Follow-Up Appointments: Follow-up Information    [place]. Go on 02/13/2016.   Why:  Go to appointment at 2:30 PM Contact information: [place], DO 02/08/2016, 8:36 PM PGY-1, [place]

## 2016-02-08 ENCOUNTER — Ambulatory Visit (INDEPENDENT_AMBULATORY_CARE_PROVIDER_SITE_OTHER): Payer: Medicaid Other | Admitting: Family Medicine

## 2016-02-08 ENCOUNTER — Encounter: Payer: Self-pay | Admitting: Family Medicine

## 2016-02-08 DIAGNOSIS — M79605 Pain in left leg: Secondary | ICD-10-CM | POA: Diagnosis not present

## 2016-02-08 DIAGNOSIS — M79604 Pain in right leg: Secondary | ICD-10-CM | POA: Diagnosis not present

## 2016-02-08 DIAGNOSIS — M545 Low back pain: Secondary | ICD-10-CM | POA: Diagnosis present

## 2016-02-08 NOTE — Progress Notes (Signed)
Subjective:    Patient ID: Susan Sanders , female   DOB: [birth year] , 45 y.o..   MRN: [id number]  HPI  ANYA WIENEKE is here for a same day appointment.  Chief Complaint  Patient presents with  . Leg Pain  . Back Pain   Patient was recently admitted to [facility] on 9/23 due to syncope with fall and discharged yesterday on 9/26. Patient had mostly negative cardiac and neurologic workup except for positive orthostatic blood pressure and carotid Doppler showing increased diastolic pressure in ICA. Vascular was curbside consulted and recommended outpatient CTA although it would unlikely show anything given age and condition. They also did not think that this carotid Doppler finding was related to her syncope. Physical therapy saw patient and recommended outpatient physical therapy. Leading differentials at the time included hypoglycemia due to no breakfast, hypertension, and arrhythmia. Patient was sent home yesterday with return precautions and was instructed to follow-up at our clinic on October 2. Last MRI of back was on 05/2014. She has been on oxycodone and gabapentin in the past.   Patient is coming in today for bilateral lower leg pain since yesterday. She has not had any trouble ambulating and just describes it as "soreness". She has never had leg pain before like this. Denies any falls, loss of consciousness, weakness, numbness, tingling. Patient is tearful intermittently because she is concerned about this. She has tried Tylenol for the pain which provided minimal relief. Today she rates her pain 7 out of 10. She has not slept or eaten since being discharged from the hospital because she has been upset about the whole situation. Denies any trouble at home, abuse, straining relationships. Denies depression or anxiety.  Review of Systems: Per HPI. All other systems reviewed and are negative.  Past Medical History: Patient Active Problem List   Diagnosis Date Noted  . Muscle weakness  (generalized)   . Syncope and collapse 02/04/2016  . Essential hypertension 07/14/2015  . Cervical spondylolysis 06/18/2014  . Cervical cancer screening 12/10/2013  . Healthcare maintenance 12/10/2013  . Former smoker 12/10/2013  . GERD (gastroesophageal reflux disease) 12/10/2013  . Fatigue 09/09/2013  . Pain, joint, hand, right 05/20/2013  . Leg pain, bilateral 04/22/2012  . Dyshidrotic eczema 12/26/2011  . Back pain 12/05/2010  . ALLERGIC RHINITIS 08/20/2008  . DYSFUNCTIONAL UTERINE BLEEDING 03/20/2007  . MIGRAINE HEADACHE 02/20/2007  . Anxiety and depression 01/23/2007    Medications: reviewed  Social Hx:  reports that she quit smoking about 5 years ago. She quit after 14.00 years of use. She has never used smokeless tobacco.    Objective:   BP (!) 153/86 (BP Location: Right Arm, Patient Position: Sitting, Cuff Size: Normal)   Pulse 92   Temp 98.6 F (37 C) (Oral)   Ht 5\' 5"  (1.651 m)   Wt 165 lb 3.2 oz (74.9 kg)   LMP 01/25/2016 (Approximate)   BMI 27.49 kg/m  Physical Exam  Gen: NAD, alert, cooperative with exam, sleepy appearing HEENT: NCAT, PERRL, clear conjunctiva, oropharynx clear, supple neck Cardiac: Regular rate and rhythm, normal S1/S2, no murmur, no edema, capillary refill brisk, 2+ DP pulses bilaterally Respiratory: Clear to auscultation bilaterally, no wheezes, non-labored breathing Gastrointestinal: soft, non tender, non distended, bowel sounds present Skin: no rashes, normal turgor  Neurological: Alert and oriented x 3, CN 2-12 intact, 5/5 grip strength, sensation grossly intact throughout MSK:  Legs: normal bulk and tone, mild tenderness to palpation, negative homan's sign bilaterally Back Exam:  Inspection: Unremarkable  Palpable tenderness: lumbar region Range of Motion:  Flexion 45 deg; Extension 45 deg; Side Bending to 45 deg bilaterally; Rotation to 45 deg bilaterally  Leg strength: Quad: 5/5 Hamstring: 5/5 Hip flexor: 5/5 Hip abductors: 5/5    Strength at foot: Plantar-flexion: 5/5 Dorsi-flexion: 5/5 Eversion: 5/5 Inversion: 5/5  Sensory change: Gross sensation intact to all lumbar and sacral dermatomes.  Reflexes: 2+ at both patellar tendonsGait unremarkable. Psych: blunted affect, tearful at times   Assessment & Plan:  Back pain Patient has long-standing history of lumbar back pain, not currently on any pain medications. Suspect that patient's back pain right now is related lying in the hospital bed. Physical exam unremarkable except some mild tenderness to palpation along her region. No red flag symptoms on back exam. Return precautions and red flag symptoms discussed. Will try Tylenol when necessary.  Leg pain, bilateral Pain likely secondary to laying in a hospital bed, she was recently discharged from [facility] yesterday. No signs of DVT on exam. Physical exam within normal limits except for some mild tenderness to palpation of bilateral calves. Patient has vague symptoms and seems to be worried about something that is seriously wrong with her that she is unable to completely communicate. Provided reassurance for patient with her normal physical exam which seemed to help her. She appears very sleepy on exam today, denies any alcohol or drug use. She has not slept or eaten since being discharged yesterday and suspect this is contributing to some of her symptoms. Based on patient's behavior, could consider possible adjustment disorder/regression after being in the hospital. Patient did not want any further workup or medications today, she stated that she just wanted to go home and rest. She will try Tylenol when necessary. Return precautions and red flag symptoms discussed. She has a hospital follow-up appointment coming up on October 2.    Smitty Cords, MD [place], PGY-2

## 2016-02-08 NOTE — Assessment & Plan Note (Addendum)
Pain likely secondary to laying in a hospital bed, she was recently discharged from [facility] yesterday. No signs of DVT on exam. Physical exam within normal limits except for some mild tenderness to palpation of bilateral calves. Patient has vague symptoms and seems to be worried about something that is seriously wrong with her that she is unable to completely communicate. Provided reassurance for patient with her normal physical exam which seemed to help her. She appears very sleepy on exam today, denies any alcohol or drug use. She has not slept or eaten since being discharged yesterday and suspect this is contributing to some of her symptoms. Based on patient's behavior, could consider possible adjustment disorder/regression after being in the hospital. Patient did not want any further workup or medications today, she stated that she just wanted to go home and rest. She will try Tylenol when necessary. Return precautions and red flag symptoms discussed. She has a hospital follow-up appointment coming up on October 2.

## 2016-02-08 NOTE — Patient Instructions (Addendum)
Thank you for coming in today, it was so nice to see you! Today we talked about:    Leg Pain: Please take Tylenol as needed for pain. Please get some rest, eat 3 meals a day and drink plenty of fluids.  Please go to the hospital if you pass out, develop chest pain or shortness of breath. Also please call us if your leg pain gets worse.   Please follow up on October 2nd.   If you have any questions or concerns, please do not hesitate to call the office at [phone]. You can also message me directly via MyChart.   Sincerely,  Smitty Cords, MD

## 2016-02-08 NOTE — Assessment & Plan Note (Addendum)
Patient has long-standing history of lumbar back pain, not currently on any pain medications. Suspect that patient's back pain right now is related lying in the hospital bed. Physical exam unremarkable except some mild tenderness to palpation along her region. No red flag symptoms on back exam. Return precautions and red flag symptoms discussed. Will try Tylenol when necessary.

## 2016-02-13 ENCOUNTER — Encounter: Payer: Self-pay | Admitting: Family Medicine

## 2016-02-13 ENCOUNTER — Ambulatory Visit (INDEPENDENT_AMBULATORY_CARE_PROVIDER_SITE_OTHER): Payer: Medicaid Other | Admitting: Family Medicine

## 2016-02-13 VITALS — BP 140/79 | HR 84 | Temp 98.3°F | Resp 16 | Wt 169.8 lb

## 2016-02-13 DIAGNOSIS — D509 Iron deficiency anemia, unspecified: Secondary | ICD-10-CM | POA: Diagnosis not present

## 2016-02-13 DIAGNOSIS — M79604 Pain in right leg: Secondary | ICD-10-CM

## 2016-02-13 DIAGNOSIS — R55 Syncope and collapse: Secondary | ICD-10-CM | POA: Diagnosis not present

## 2016-02-13 DIAGNOSIS — M79605 Pain in left leg: Secondary | ICD-10-CM

## 2016-02-13 LAB — IRON AND TIBC
%SAT: 9 % — ABNORMAL LOW (ref 11–50)
IRON: 31 ug/dL — AB (ref 40–190)
TIBC: 343 ug/dL (ref 250–450)
UIBC: 312 ug/dL (ref 125–400)

## 2016-02-13 NOTE — Progress Notes (Signed)
    Subjective:  Susan Sanders is a 45 y.o. female who presents to the [facility] today with a chief complaint of hospital follow up.   HPI:  Syncopal Episode Patient admitted with questionable syncopal episode about a month ago. Fall was witnessed by her daughter who did not see a loss of consciousness. She had a negative work up in the hospital including normal head CT, UA, echo, carotid dopplers, and orthostatic vitals. She was also evaluated by neurology. Since being discharged from the hospital, she has done well and has not had any further episodes. She was able to work with physical therapy at the hospital and felt comfortable going home.   Bilateral Leg Pain Patient seen in clinic last week with bilateral leg pain. Did not have any red flag or symptoms at that time. Pain was thought to be mostly related to laying in a hospital bed since discharge. Since her last visit, she is doing much better. She thinks that her pain may have been due to her menstrual cycle starting.   Normocytic Anemia Patient reports prior diagnosis of iron deficiency. Was noted to be mildly anemic during hospitalization. Is not currently taking any supplements.   ROS: Per HPI  PMH: Smoking history reviewed.   Objective:  Physical Exam: BP 140/79 (BP Location: Left Arm, Patient Position: Sitting, Cuff Size: Large)   Pulse 84   Temp 98.3 F (36.8 C) (Oral)   Resp 16   Wt 169 lb 12.8 oz (77 kg)   LMP 01/25/2016 (Approximate)   BMI 28.26 kg/m   Gen: NAD, resting comfortably CV: RRR with no murmurs appreciated Pulm: NWOB, CTAB with no crackles, wheezes, or rhonchi GI: Normal bowel sounds present. Soft, Nontender, Nondistended. MSK: no edema, cyanosis, or clubbing noted Skin: warm, dry Neuro: grossly normal, moves all extremities Psych: blunted affect. Normal thought content.   Assessment/Plan:  Syncope and collapse No recurrent episodes. Unclear etiology for initial presentation, though patient found to have  markedly blunt affect today. May have an underlying psychiatric condition contributing to her overall picture. Will defer to PCP for further exploration.   Leg pain, bilateral Much improved. No red flag signs or symptoms. Follow up as needed.   Normocytic Anemia Will check iron panel and CBC today. Start supplements as indicated.   Algis Greenhouse. Jerline Pain, [facility] Resident PGY-3 02/13/2016 5:07 PM

## 2016-02-13 NOTE — Assessment & Plan Note (Signed)
Much improved. No red flag signs or symptoms. Follow up as needed.

## 2016-02-13 NOTE — Assessment & Plan Note (Signed)
No recurrent episodes. Unclear etiology for initial presentation, though patient found to have markedly blunt affect today. May have an underlying psychiatric condition contributing to her overall picture. Will defer to PCP for further exploration.

## 2016-02-13 NOTE — Patient Instructions (Signed)
I am glad that you are feeling better.  Continue to use the heating bad.  We will check your iron levels today.  If you do not continue to improve, please come back to see [place].  Take care,  Dr Jerline Pain

## 2016-02-14 LAB — CBC
HCT: 35.8 % (ref 35.0–45.0)
HEMOGLOBIN: 11.5 g/dL — AB (ref 11.7–15.5)
MCH: 25.8 pg — AB (ref 27.0–33.0)
MCHC: 32.1 g/dL (ref 32.0–36.0)
MCV: 80.3 fL (ref 80.0–100.0)
MPV: 9.4 fL (ref 7.5–12.5)
PLATELETS: 438 10*3/uL — AB (ref 140–400)
RBC: 4.46 MIL/uL (ref 3.80–5.10)
RDW: 16.2 % — ABNORMAL HIGH (ref 11.0–15.0)
WBC: 6.8 10*3/uL (ref 3.8–10.8)

## 2016-02-14 LAB — FERRITIN: FERRITIN: 15 ng/mL (ref 10–232)

## 2016-02-16 ENCOUNTER — Telehealth: Payer: Self-pay | Admitting: Family Medicine

## 2016-02-16 NOTE — Telephone Encounter (Signed)
Tried calling patient, no answer, and unable to leave VM. Will continue to try.

## 2016-02-16 NOTE — Telephone Encounter (Signed)
I have tried calling the patient a few times to discuss her blood work. It looks like her iron levels are just a little low. She should start taking an iron supplement (she has done this in the past) and we can recheck it in a few weeks.  Algis Greenhouse. Jerline Pain, [facility] Resident PGY-3 02/16/2016 3:28 PM

## 2016-02-19 ENCOUNTER — Emergency Department (HOSPITAL_COMMUNITY)
Admission: EM | Admit: 2016-02-19 | Discharge: 2016-02-19 | Discharge status: Against Medical Advice | Payer: Medicaid Other

## 2016-02-19 NOTE — ED Notes (Signed)
Called patient to triage. Was informed by Reg, that patient went to car.

## 2016-02-19 NOTE — ED Notes (Signed)
Called pt to triage X3. No response

## 2016-02-19 NOTE — ED Notes (Signed)
Called patient to triage. Unable to locate at this time. 

## 2016-02-27 ENCOUNTER — Emergency Department (HOSPITAL_COMMUNITY): Payer: Medicaid Other

## 2016-02-27 ENCOUNTER — Encounter (HOSPITAL_COMMUNITY): Payer: Self-pay

## 2016-02-27 ENCOUNTER — Emergency Department (HOSPITAL_COMMUNITY)
Admission: EM | Admit: 2016-02-27 | Discharge: 2016-02-27 | Disposition: A | Discharge status: Home / Self Care | Payer: Medicaid Other | Attending: Emergency Medicine | Admitting: Emergency Medicine

## 2016-02-27 DIAGNOSIS — W1839XA Other fall on same level, initial encounter: Secondary | ICD-10-CM | POA: Insufficient documentation

## 2016-02-27 DIAGNOSIS — Z79899 Other long term (current) drug therapy: Secondary | ICD-10-CM | POA: Insufficient documentation

## 2016-02-27 DIAGNOSIS — M25571 Pain in right ankle and joints of right foot: Secondary | ICD-10-CM

## 2016-02-27 DIAGNOSIS — S93401A Sprain of unspecified ligament of right ankle, initial encounter: Secondary | ICD-10-CM | POA: Insufficient documentation

## 2016-02-27 DIAGNOSIS — I10 Essential (primary) hypertension: Secondary | ICD-10-CM | POA: Insufficient documentation

## 2016-02-27 DIAGNOSIS — Y92002 Bathroom of unspecified non-institutional (private) residence single-family (private) house as the place of occurrence of the external cause: Secondary | ICD-10-CM | POA: Insufficient documentation

## 2016-02-27 DIAGNOSIS — Y9389 Activity, other specified: Secondary | ICD-10-CM | POA: Insufficient documentation

## 2016-02-27 DIAGNOSIS — J45909 Unspecified asthma, uncomplicated: Secondary | ICD-10-CM | POA: Insufficient documentation

## 2016-02-27 DIAGNOSIS — Z87891 Personal history of nicotine dependence: Secondary | ICD-10-CM | POA: Insufficient documentation

## 2016-02-27 DIAGNOSIS — Y999 Unspecified external cause status: Secondary | ICD-10-CM | POA: Insufficient documentation

## 2016-02-27 MED ORDER — TRAMADOL HCL 50 MG PO TABS: 50.0000 mg | ORAL_TABLET | Freq: Four times a day (QID) | ORAL | 0 refills | Status: DC | PRN

## 2016-02-27 MED ORDER — HYDROCODONE-ACETAMINOPHEN 5-325 MG PO TABS
1.0000 | ORAL_TABLET | Freq: Once | ORAL | Status: AC
  Administered 2016-02-27: 1 via ORAL
  Filled 2016-02-27: qty 1

## 2016-02-27 NOTE — ED Provider Notes (Signed)
[facility] DEPT Provider Note   CSN: [id number] Arrival date & time: 02/27/16  1112  By signing my name below, I, Susan Sanders, attest that this documentation has been prepared under the direction and in the presence of Recardo Evangelist, PA-C. Electronically Signed: Rayna Sanders, ED Scribe. 02/27/16. 1:14 PM.  History   Chief Complaint Chief Complaint  Patient presents with  . Foot Pain   HPI HPI Comments: Susan Sanders is a 45 y.o. female who presents to the Emergency Department complaining of a fall that occurred four days ago. Pt states she was going to the shower and lost consciousness resulting in a fall to the floor. She reports a h/o of LOC and was recently admitted from 9/23-9/26 for syncopal workup which was unremarkable. She believes she has been having LOC "due to her high BP" and notes having a f/u appointment with a cardiologist. Pt is unsure if she struck her foot during the fall but has since been experiencing worsening, moderate, right dorsal foot pain and right ankle pain. Her pain worsens with ambulation and radiates up her right leg. She has taken ibuprofen and applied ice/heat w/o significant relief. She denies a h/o injuries to her right ankle. She denies weakness, numbness or other associated symptoms at this time.   The history is provided by the patient. No language interpreter was used.    Past Medical History:  Diagnosis Date  . Anxiety   . Asthma   . Cellulitis   . Chest pain   . Chronic back pain greater than 3 months duration   . Complication of anesthesia    difficult waking up "  . Depression   . Depression   . Eczema   . Hypertension   . Scoliosis     Patient Active Problem List   Diagnosis Date Noted  . Muscle weakness (generalized)   . Syncope and collapse 02/04/2016  . Essential hypertension 07/14/2015  . Cervical spondylolysis 06/18/2014  . Cervical cancer screening 12/10/2013  . Healthcare maintenance 12/10/2013  . Former  smoker 12/10/2013  . GERD (gastroesophageal reflux disease) 12/10/2013  . Fatigue 09/09/2013  . Pain, joint, hand, right 05/20/2013  . Leg pain, bilateral 04/22/2012  . Dyshidrotic eczema 12/26/2011  . Back pain 12/05/2010  . ALLERGIC RHINITIS 08/20/2008  . DYSFUNCTIONAL UTERINE BLEEDING 03/20/2007  . MIGRAINE HEADACHE 02/20/2007  . Anxiety and depression 01/23/2007    Past Surgical History:  Procedure Laterality Date  . TUBAL LIGATION      OB History    Gravida Para Term Preterm AB Living   7 6 4 2 1 6    SAB TAB Ectopic Multiple Live Births       1         Home Medications    Prior to Admission medications   Medication Sig Start Date End Date Taking? Authorizing Provider  amLODipine (NORVASC) 10 MG tablet Take 1 tablet (10 mg total) by mouth daily. 06/23/15   Konrad Felix, PA  Multiple Vitamins-Minerals (MULTIVITAMIN WITH MINERALS) tablet Take 1 tablet by mouth daily.    Historical Provider, MD  ranitidine (ZANTAC) 150 MG tablet Take 1 tablet (150 mg total) by mouth 2 (two) times daily. 01/29/16   Varney Biles, MD    Family History No family history on file.  Social History Social History  Substance Use Topics  . Smoking status: Former Smoker    Years: 14.00    Quit date: 09/20/2010  . Smokeless tobacco: Never Used  .  Alcohol use Yes     Comment: occasional      Allergies   Latex; Amoxicillin; and Naproxen   Review of Systems Review of Systems  Musculoskeletal: Positive for arthralgias and myalgias.  Skin: Negative for color change and wound.  Neurological: Negative for weakness and numbness.   Physical Exam Updated Vital Signs BP 160/97 (BP Location: Right Arm)   Pulse 77   Temp 98.5 F (36.9 C) (Oral)   Resp 16   Ht 5\' 5"  (1.651 m)   Wt 160 lb (72.6 kg)   LMP 01/25/2016 (Approximate)   SpO2 100%   BMI 26.63 kg/m   Physical Exam  Constitutional: She is oriented to person, place, and time. She appears well-developed and well-nourished.    HENT:  Head: Normocephalic and atraumatic.  Eyes: EOM are normal.  Neck: Normal range of motion.  Cardiovascular: Normal rate.   Pulmonary/Chest: Effort normal. No respiratory distress.  Abdominal: Soft.  Musculoskeletal: Normal range of motion. She exhibits tenderness. She exhibits no edema or deformity.  No obvious swelling or deformity. Tenderness over the lateral right ankle and foot. Pain with ROM.  Neurological: She is alert and oriented to person, place, and time.  Neurovascularly intact. Antalgic gait.   Skin: Skin is warm and dry.  Psychiatric: She has a normal mood and affect.  Nursing note and vitals reviewed.  ED Treatments / Results  Labs (all labs ordered are listed, but only abnormal results are displayed) Labs Reviewed - No data to display  EKG  EKG Interpretation None      Radiology Dg Foot Complete Right  Result Date: 02/27/2016 CLINICAL DATA:  Persistent pain after fall 2 weeks prior EXAM: RIGHT FOOT COMPLETE - 3+ VIEW COMPARISON:  April 24, 2012 FINDINGS: Frontal, oblique, and lateral views were obtained. There is no fracture or dislocation. The joint spaces appear normal. No erosive change. There is a small os peroneum, an anatomic variant. IMPRESSION: No fracture or dislocation.  No evident arthropathy. Electronically Signed   By: Lowella Grip III M.D.   On: 02/27/2016 11:41   Procedures Procedures  DIAGNOSTIC STUDIES: Oxygen Saturation is 100% on RA, normal by my interpretation.    COORDINATION OF CARE: 1:11 PM Discussed next steps with pt. Pt verbalized understanding and is agreeable with the plan.    Medications Ordered in ED Medications  HYDROcodone-acetaminophen (NORCO/VICODIN) 5-325 MG per tablet 1 tablet (1 tablet Oral Given 02/27/16 1324)    Initial Impression / Assessment and Plan / ED Course  I have reviewed the triage vital signs and the nursing notes.  Pertinent labs & imaging results that were available during my care of the  patient were reviewed by me and considered in my medical decision making (see chart for details).  Clinical Course   45 year old female with ankle sprain. Patient X-Ray negative for obvious fracture or dislocation.  Pt advised to follow up with orthopedics. Patient given ankle brace and crutches while in ED, conservative therapy recommended and discussed. Patient will be discharged home & is agreeable with above plan. Returns precautions discussed. Pt appears safe for discharge.  I personally performed the services described in this documentation, which was scribed in my presence. The recorded information has been reviewed and is accurate.  Final Clinical Impressions(s) / ED Diagnoses   Final diagnoses:  Acute right ankle pain    New Prescriptions Discharge Medication List as of 02/27/2016  2:03 PM    START taking these medications   Details  traMADol (ULTRAM) 50 MG tablet Take 1 tablet (50 mg total) by mouth every 6 (six) hours as needed., Starting Mon 02/27/2016, Print         Recardo Evangelist, PA-C 02/27/16 1723    Tanna Furry, MD 03/12/16 2101

## 2016-02-27 NOTE — ED Triage Notes (Signed)
Pt presents with c/o right foot pain. Pt reports she did fall approx 2 weeks ago but is unsure as to when exactly she injured her foot. Ambulatory to triage.

## 2016-02-27 NOTE — ED Notes (Signed)
Called Ortho tech for brace and crutches

## 2016-02-27 NOTE — Discharge Instructions (Signed)
Rest, ice, compression, elevation Take Ibuprofen 600mg  three times daily Take pain medicine when pain is severe - please be careful because this medicine can make you drowsy Please make follow up appointment with your PCP

## 2016-03-27 ENCOUNTER — Encounter (HOSPITAL_COMMUNITY): Payer: Self-pay | Admitting: Emergency Medicine

## 2016-03-27 ENCOUNTER — Emergency Department (HOSPITAL_COMMUNITY)
Admission: EM | Admit: 2016-03-27 | Discharge: 2016-03-27 | Disposition: A | Discharge status: Home / Self Care | Payer: Medicaid Other | Attending: Emergency Medicine | Admitting: Emergency Medicine

## 2016-03-27 DIAGNOSIS — Z91040 Latex allergy status: Secondary | ICD-10-CM | POA: Insufficient documentation

## 2016-03-27 DIAGNOSIS — Z87891 Personal history of nicotine dependence: Secondary | ICD-10-CM | POA: Insufficient documentation

## 2016-03-27 DIAGNOSIS — J45909 Unspecified asthma, uncomplicated: Secondary | ICD-10-CM | POA: Insufficient documentation

## 2016-03-27 DIAGNOSIS — I10 Essential (primary) hypertension: Secondary | ICD-10-CM | POA: Insufficient documentation

## 2016-03-27 DIAGNOSIS — Z79899 Other long term (current) drug therapy: Secondary | ICD-10-CM | POA: Insufficient documentation

## 2016-03-27 DIAGNOSIS — M25511 Pain in right shoulder: Secondary | ICD-10-CM | POA: Insufficient documentation

## 2016-03-27 MED ORDER — IBUPROFEN 600 MG PO TABS: 600.0000 mg | ORAL_TABLET | Freq: Four times a day (QID) | ORAL | 0 refills | Status: DC | PRN

## 2016-03-27 MED ORDER — METHOCARBAMOL 500 MG PO TABS: 500.0000 mg | ORAL_TABLET | Freq: Two times a day (BID) | ORAL | 0 refills | Status: DC

## 2016-03-27 NOTE — ED Triage Notes (Signed)
Pt c/o constant pinching pain in right neck and shoulder x 3 days. Pain worse with movement. No injury known, but pt works as Quarry manager and moves heavy patients.

## 2016-03-27 NOTE — ED Provider Notes (Signed)
[facility] DEPT Provider Note   CSN: [id number] Arrival date & time: 03/27/16  1057   By signing my name below, I, Susan Sanders, attest that this documentation has been prepared under the direction and in the presence of Etta Quill, NP. Electronically Signed: Neta Sanders, ED Scribe. 03/27/2016. 12:32 PM.   History   Chief Complaint Chief Complaint  Patient presents with  . Arm Pain  . Neck Pain    The history is provided by the patient. No language interpreter was used.  Arm Pain  This is a new problem. The current episode started more than 2 days ago. The problem occurs constantly. The problem has not changed since onset.Exacerbated by: ROM. Nothing relieves the symptoms. Treatments tried: Tramabdol, ibuprofen, heat, ice. The treatment provided no relief.   HPI Comments:  Susan Sanders is a 45 y.o. female who presents to the Emergency Department complaining of constant pain to his right shoulder and neck pain x 3 days. Pt describes the pain as "pinching." Pain is worse with ROM. 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 Pt has used tramadol, ibuprofen, heat and ice with no relief. Pt denies injury/trauma. Pt denies other associated symptoms.   Past Medical History:  Diagnosis Date  . Anxiety   . Asthma   . Cellulitis   . Chest pain   . Chronic back pain greater than 3 months duration   . Complication of anesthesia    difficult waking up "  . Depression   . Depression   . Eczema   . Hypertension   . Scoliosis     Patient Active Problem List   Diagnosis Date Noted  . Muscle weakness (generalized)   . Syncope and collapse 02/04/2016  . Essential hypertension 07/14/2015  . Cervical spondylolysis 06/18/2014  . Cervical cancer screening 12/10/2013  . Healthcare maintenance 12/10/2013  . Former smoker 12/10/2013  . GERD (gastroesophageal reflux disease) 12/10/2013  . Fatigue 09/09/2013  . Pain, joint, hand, right 05/20/2013  . Leg pain,  bilateral 04/22/2012  . Dyshidrotic eczema 12/26/2011  . Back pain 12/05/2010  . ALLERGIC RHINITIS 08/20/2008  . DYSFUNCTIONAL UTERINE BLEEDING 03/20/2007  . MIGRAINE HEADACHE 02/20/2007  . Anxiety and depression 01/23/2007    Past Surgical History:  Procedure Laterality Date  . TUBAL LIGATION      OB History    Gravida Para Term Preterm AB Living   7 6 4 2 1 6    SAB TAB Ectopic Multiple Live Births       1           Home Medications    Prior to Admission medications   Medication Sig Start Date End Date Taking? Authorizing Provider  amLODipine (NORVASC) 10 MG tablet Take 1 tablet (10 mg total) by mouth daily. 06/23/15   Konrad Felix, PA  Multiple Vitamins-Minerals (MULTIVITAMIN WITH MINERALS) tablet Take 1 tablet by mouth daily.    Historical Provider, MD  ranitidine (ZANTAC) 150 MG tablet Take 1 tablet (150 mg total) by mouth 2 (two) times daily. 01/29/16   Varney Biles, MD  traMADol (ULTRAM) 50 MG tablet Take 1 tablet (50 mg total) by mouth every 6 (six) hours as needed. 02/27/16   Recardo Evangelist, PA-C    Family History History reviewed. No pertinent family history.  Social History Social History  Substance Use Topics  . Smoking status: Former Smoker    Years: 14.00    Quit date: 09/20/2010  . Smokeless tobacco:  Never Used  . Alcohol use Yes     Comment: occasional      Allergies   Latex; Amoxicillin; Naproxen; and Tramadol   Review of Systems Review of Systems  Musculoskeletal: Positive for arthralgias and myalgias.  Neurological: Negative for numbness.  All other systems reviewed and are negative.    Physical Exam Updated Vital Signs BP 175/92 (BP Location: Right Arm) Comment: Out of BP medications, plan to call PCP.  Pulse 74   Temp 98.1 F (36.7 C) (Oral)   Resp 16   SpO2 100%   Physical Exam  Constitutional: She appears well-developed and well-nourished. No distress.  HENT:  Head: Normocephalic and atraumatic.  Eyes: Conjunctivae are  normal.  Cardiovascular: Normal rate.   Pulmonary/Chest: Effort normal.  Abdominal: She exhibits no distension.  Musculoskeletal:  Muscle pain to posterior aspect of shoulder.   Neurological: She is alert.  Skin: Skin is warm and dry.  Psychiatric: She has a normal mood and affect.  Nursing note and vitals reviewed.    ED Treatments / Results  DIAGNOSTIC STUDIES:  Oxygen Saturation is 100% on RA, normal by my interpretation.    COORDINATION OF CARE:  12:32 PM Discussed treatment plan with pt at bedside and pt agreed to plan.   Labs (all labs ordered are listed, but only abnormal results are displayed) Labs Reviewed - No data to display  EKG  EKG Interpretation None       Radiology No results found.  Procedures Procedures (including critical care time)  Medications Ordered in ED Medications - No data to display   Initial Impression / Assessment and Plan / ED Course  Patient with right posterior shoulder pain. No known injury. Does not appear radicular in origin. Suspect muscle strain/spasm. Conservative therapy recommended and discussed. Patient will be discharged home & is agreeable with above plan. Returns precautions discussed. Patient to follow-up with PCP if no improvement. Pt appears safe for discharge.   I have reviewed the triage vital signs and the nursing notes.  Pertinent labs & imaging results that were available during my care of the patient were reviewed by me and considered in my medical decision making (see chart for details).  Clinical Course       Final Clinical Impressions(s) / ED Diagnoses   Final diagnoses:  Acute pain of right shoulder    New Prescriptions Discharge Medication List as of 03/27/2016 12:41 PM    START taking these medications   Details  ibuprofen (ADVIL,MOTRIN) 600 MG tablet Take 1 tablet (600 mg total) by mouth every 6 (six) hours as needed., Starting Tue 03/27/2016, Print    methocarbamol (ROBAXIN) 500 MG  tablet Take 1 tablet (500 mg total) by mouth 2 (two) times daily., Starting Tue 03/27/2016, Print      I personally performed the services described in this documentation, which was scribed in my presence. The recorded information has been reviewed and is accurate.     Etta Quill, NP 03/27/16 [place], MD 03/28/16 2040

## 2016-05-23 ENCOUNTER — Other Ambulatory Visit: Payer: Self-pay | Admitting: Internal Medicine

## 2016-05-23 MED ORDER — AMLODIPINE BESYLATE 10 MG PO TABS: 10.0000 mg | ORAL_TABLET | Freq: Every day | ORAL | 0 refills | Status: DC

## 2016-05-23 NOTE — Telephone Encounter (Signed)
Sent refill. Please call patient and let her know that she needs to make an appointment for blood pressure follow up. Her last refill of this medication per chart review was in 07/2015.

## 2016-05-23 NOTE — Telephone Encounter (Signed)
Pt was at work when I called, her mother answered, informed mother that her daughters rx was refilled, however, she will need a fu apt for additional refills.

## 2016-05-23 NOTE — Telephone Encounter (Signed)
Needs refill on blood pressure pills-amlodipine. [facility] [address]

## 2016-06-06 ENCOUNTER — Emergency Department (HOSPITAL_COMMUNITY)
Admission: EM | Admit: 2016-06-06 | Discharge: 2016-06-06 | Disposition: A | Discharge status: Home / Self Care | Payer: Medicaid Other | Attending: Emergency Medicine | Admitting: Emergency Medicine

## 2016-06-06 ENCOUNTER — Encounter (HOSPITAL_COMMUNITY): Payer: Self-pay

## 2016-06-06 DIAGNOSIS — R69 Illness, unspecified: Secondary | ICD-10-CM

## 2016-06-06 DIAGNOSIS — Z87891 Personal history of nicotine dependence: Secondary | ICD-10-CM | POA: Insufficient documentation

## 2016-06-06 DIAGNOSIS — I10 Essential (primary) hypertension: Secondary | ICD-10-CM | POA: Insufficient documentation

## 2016-06-06 DIAGNOSIS — Z91040 Latex allergy status: Secondary | ICD-10-CM | POA: Insufficient documentation

## 2016-06-06 DIAGNOSIS — J111 Influenza due to unidentified influenza virus with other respiratory manifestations: Secondary | ICD-10-CM | POA: Insufficient documentation

## 2016-06-06 DIAGNOSIS — J45909 Unspecified asthma, uncomplicated: Secondary | ICD-10-CM | POA: Insufficient documentation

## 2016-06-06 LAB — COMPREHENSIVE METABOLIC PANEL
ALBUMIN: 3.9 g/dL (ref 3.5–5.0)
ALK PHOS: 43 U/L (ref 38–126)
ALT: 13 U/L — ABNORMAL LOW (ref 14–54)
AST: 18 U/L (ref 15–41)
Anion gap: 6 (ref 5–15)
BILIRUBIN TOTAL: 0.4 mg/dL (ref 0.3–1.2)
BUN: 11 mg/dL (ref 6–20)
CALCIUM: 8.6 mg/dL — AB (ref 8.9–10.3)
CO2: 21 mmol/L — ABNORMAL LOW (ref 22–32)
CREATININE: 0.51 mg/dL (ref 0.44–1.00)
Chloride: 106 mmol/L (ref 101–111)
GFR calc Af Amer: >60 mL/min (ref >60)
GLUCOSE: 91 mg/dL (ref 65–99)
POTASSIUM: 3.8 mmol/L (ref 3.5–5.1)
Sodium: 133 mmol/L — ABNORMAL LOW (ref 135–145)
TOTAL PROTEIN: 7.1 g/dL (ref 6.5–8.1)

## 2016-06-06 LAB — CBC
HCT: 32.9 % — ABNORMAL LOW (ref 36.0–46.0)
Hemoglobin: 10.9 g/dL — ABNORMAL LOW (ref 12.0–15.0)
MCH: 26 pg (ref 26.0–34.0)
MCHC: 33.1 g/dL (ref 30.0–36.0)
MCV: 78.3 fL (ref 78.0–100.0)
PLATELETS: 410 10*3/uL — AB (ref 150–400)
RBC: 4.2 MIL/uL (ref 3.87–5.11)
RDW: 16 % — AB (ref 11.5–15.5)
WBC: 6.8 10*3/uL (ref 4.0–10.5)

## 2016-06-06 LAB — I-STAT BETA HCG BLOOD, ED (MC, WL, AP ONLY)

## 2016-06-06 LAB — LIPASE, BLOOD: Lipase: 33 U/L (ref 11–51)

## 2016-06-06 MED ORDER — IBUPROFEN 800 MG PO TABS
800.0000 mg | ORAL_TABLET | Freq: Once | ORAL | Status: AC
  Administered 2016-06-06: 800 mg via ORAL
  Filled 2016-06-06: qty 1

## 2016-06-06 MED ORDER — BISMUTH SUBSALICYLATE 262 MG/15ML PO SUSP
30.0000 mL | Freq: Once | ORAL | Status: AC
  Administered 2016-06-06: 30 mL via ORAL
  Filled 2016-06-06: qty 118

## 2016-06-06 MED ORDER — IBUPROFEN 800 MG PO TABS: 800.0000 mg | ORAL_TABLET | Freq: Four times a day (QID) | ORAL | 0 refills | Status: DC | PRN

## 2016-06-06 MED ORDER — ACETAMINOPHEN 500 MG PO TABS: 1000.0000 mg | ORAL_TABLET | Freq: Four times a day (QID) | ORAL | 0 refills | Status: DC | PRN

## 2016-06-06 NOTE — ED Provider Notes (Signed)
[facility] DEPT Provider Note   CSN: [id number] Arrival date & time: 06/06/16  1322     History   Chief Complaint Chief Complaint  Patient presents with  . Generalized Body Aches  . Diarrhea  . Nausea  . Headache    HPI Susan Sanders is a 46 y.o. female.  The history is provided by the patient.  Diarrhea   This is a new problem. The current episode started 2 days ago. The problem occurs 5 to 10 times per day. The problem has not changed since onset.The stool consistency is described as watery. The maximum temperature recorded prior to her arrival was 101 to 101.9 F. The fever has been present for less than 1 day. Associated symptoms include vomiting (this morning but improved) and headaches. Treatments tried: phenergan, tylenol 500 mg every 4-5 hours. The treatment provided mild relief. 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  Headache   Associated symptoms include vomiting (this morning but improved).    Past Medical History:  Diagnosis Date  . Anxiety   . Asthma   . Cellulitis   . Chest pain   . Chronic back pain greater than 3 months duration   . Complication of anesthesia    difficult waking up "  . Depression   . Depression   . Eczema   . Hypertension   . Scoliosis     Patient Active Problem List   Diagnosis Date Noted  . Muscle weakness (generalized)   . Syncope and collapse 02/04/2016  . Essential hypertension 07/14/2015  . Cervical spondylolysis 06/18/2014  . Cervical cancer screening 12/10/2013  . Healthcare maintenance 12/10/2013  . Former smoker 12/10/2013  . GERD (gastroesophageal reflux disease) 12/10/2013  . Fatigue 09/09/2013  . Pain, joint, hand, right 05/20/2013  . Leg pain, bilateral 04/22/2012  . Dyshidrotic eczema 12/26/2011  . Back pain 12/05/2010  . ALLERGIC RHINITIS 08/20/2008  . DYSFUNCTIONAL UTERINE BLEEDING 03/20/2007  . MIGRAINE HEADACHE 02/20/2007  . Anxiety and depression 01/23/2007    Past  Surgical History:  Procedure Laterality Date  . TUBAL LIGATION      OB History    Gravida Para Term Preterm AB Living   7 6 4 2 1 6    SAB TAB Ectopic Multiple Live Births       1           Home Medications    Prior to Admission medications   Medication Sig Start Date End Date Taking? Authorizing Provider  amLODipine (NORVASC) 10 MG tablet Take 1 tablet (10 mg total) by mouth daily. 05/23/16  Yes Smiley Houseman, MD  Multiple Vitamins-Minerals (MULTIVITAMIN WITH MINERALS) tablet Take 1 tablet by mouth daily.   Yes Historical Provider, MD  ibuprofen (ADVIL,MOTRIN) 600 MG tablet Take 1 tablet (600 mg total) by mouth every 6 (six) hours as needed. Patient not taking: Reported on 06/06/2016 03/27/16   Etta Quill, NP  methocarbamol (ROBAXIN) 500 MG tablet Take 1 tablet (500 mg total) by mouth 2 (two) times daily. Patient not taking: Reported on 06/06/2016 03/27/16   Etta Quill, NP  ranitidine (ZANTAC) 150 MG tablet Take 1 tablet (150 mg total) by mouth 2 (two) times daily. Patient not taking: Reported on 06/06/2016 01/29/16   Varney Biles, MD  traMADol (ULTRAM) 50 MG tablet Take 1 tablet (50 mg total) by mouth every 6 (six) hours as needed. Patient not taking: Reported on 06/06/2016 02/27/16   Recardo Evangelist, PA-C  Family History History reviewed. No pertinent family history.  Social History Social History  Substance Use Topics  . Smoking status: Former Smoker    Years: 14.00    Quit date: 09/20/2010  . Smokeless tobacco: Never Used  . Alcohol use Yes     Comment: occasional      Allergies   Latex; Amoxicillin; Naproxen; and Tramadol   Review of Systems Review of Systems  Gastrointestinal: Positive for diarrhea and vomiting (this morning but improved).  Neurological: Positive for headaches.  All other systems reviewed and are negative.    Physical Exam Updated Vital Signs BP 169/94 (BP Location: Left Arm)   Pulse 91   Temp 98.2 F (36.8 C) (Oral)   Resp  16   LMP 05/06/2016   SpO2 100%   Physical Exam  Constitutional: She is oriented to person, place, and time. She appears well-developed and well-nourished. No distress.  HENT:  Head: Normocephalic.  Nose: Nose normal.  Mouth/Throat: Oropharynx is clear and moist.  Eyes: Conjunctivae are normal.  Neck: Neck supple. No tracheal deviation present.  Cardiovascular: Normal rate, regular rhythm and normal heart sounds.   Pulmonary/Chest: Effort normal and breath sounds normal. No respiratory distress.  Abdominal: Soft. She exhibits no distension. There is no tenderness. There is no rebound and no guarding.  Neurological: She is alert and oriented to person, place, and time.  Skin: Skin is warm and dry.  Psychiatric: She has a normal mood and affect.     ED Treatments / Results  Labs (all labs ordered are listed, but only abnormal results are displayed) Labs Reviewed  COMPREHENSIVE METABOLIC PANEL - Abnormal; Notable for the following:       Result Value   Sodium 133 (*)    CO2 21 (*)    Calcium 8.6 (*)    ALT 13 (*)    All other components within normal limits  CBC - Abnormal; Notable for the following:    Hemoglobin 10.9 (*)    HCT 32.9 (*)    RDW 16.0 (*)    Platelets 410 (*)    All other components within normal limits  LIPASE, BLOOD  I-STAT BETA HCG BLOOD, ED ([facility], [facility], [facility] ONLY)    EKG  EKG Interpretation None       Radiology No results found.  Procedures Procedures (including critical care time)  Medications Ordered in ED Medications  bismuth subsalicylate (PEPTO BISMOL) 262 MG/15ML suspension 30 mL (30 mLs Oral Given 06/06/16 1720)  ibuprofen (ADVIL,MOTRIN) tablet 800 mg (800 mg Oral Given 06/06/16 1717)     Initial Impression / Assessment and Plan / ED Course  I have reviewed the triage vital signs and the nursing notes.  Pertinent labs & imaging results that were available during my care of the patient were reviewed by me and considered in my medical  decision making (see chart for details).     46 y.o. female presents with emesis, diarrhea, body aches and cough over last 72 hours. Suspect viral illness. No vital sign abnormalities and otherwise healthy, no indication for tamiflu therapy or testing outside of window. Discussed supportive care and agressive oral hydration. Plan to follow up with PCP as needed and return precautions discussed for worsening or new concerning symptoms.   Final Clinical Impressions(s) / ED Diagnoses   Final diagnoses:  Influenza-like illness    New Prescriptions Discharge Medication List as of 06/06/2016  4:58 PM    START taking these medications   Details  acetaminophen (  TYLENOL) 500 MG tablet Take 2 tablets (1,000 mg total) by mouth every 6 (six) hours as needed for mild pain or fever., Starting Wed 06/06/2016, Print         Leo Grosser, MD 06/07/16 [phone]

## 2016-06-06 NOTE — ED Triage Notes (Addendum)
Pt c/o generalized body aches, headache, and n/v/d x 2 days.  Pain score 7/10.  Pt reports taking phenergan and tylenol 1000mg  this morning w/ relief.    Pt did not get flu shot.

## 2016-06-21 ENCOUNTER — Encounter: Payer: Self-pay | Admitting: Emergency Medicine

## 2016-06-21 ENCOUNTER — Emergency Department
Admission: EM | Admit: 2016-06-21 | Discharge: 2016-06-21 | Disposition: A | Discharge status: Home / Self Care | Payer: Medicaid Other | Attending: Emergency Medicine | Admitting: Emergency Medicine

## 2016-06-21 ENCOUNTER — Other Ambulatory Visit: Payer: Self-pay | Admitting: Internal Medicine

## 2016-06-21 DIAGNOSIS — J111 Influenza due to unidentified influenza virus with other respiratory manifestations: Secondary | ICD-10-CM

## 2016-06-21 DIAGNOSIS — Z87891 Personal history of nicotine dependence: Secondary | ICD-10-CM | POA: Insufficient documentation

## 2016-06-21 DIAGNOSIS — Z760 Encounter for issue of repeat prescription: Secondary | ICD-10-CM

## 2016-06-21 DIAGNOSIS — J45909 Unspecified asthma, uncomplicated: Secondary | ICD-10-CM | POA: Insufficient documentation

## 2016-06-21 DIAGNOSIS — I10 Essential (primary) hypertension: Secondary | ICD-10-CM | POA: Insufficient documentation

## 2016-06-21 MED ORDER — AMLODIPINE BESYLATE 10 MG PO TABS: 10.0000 mg | ORAL_TABLET | Freq: Every day | ORAL | 0 refills | Status: DC

## 2016-06-21 MED ORDER — PROMETHAZINE-DM 6.25-15 MG/5ML PO SYRP: 5.0000 mL | ORAL_SOLUTION | Freq: Four times a day (QID) | ORAL | 0 refills | Status: DC | PRN

## 2016-06-21 MED ORDER — OSELTAMIVIR PHOSPHATE 75 MG PO CAPS: 75.0000 mg | ORAL_CAPSULE | Freq: Two times a day (BID) | ORAL | 0 refills | Status: DC

## 2016-06-21 NOTE — ED Provider Notes (Signed)
[facility] Emergency Department Provider Note  ____________________________________________  Time seen: Approximately 3:13 PM  I have reviewed the triage vital signs and the nursing notes.   HISTORY  Chief Complaint Flu like symptoms    HPI Susan Sanders is a 46 y.o. female , NAD, presents to the emergency department for evaluation of flulike symptoms. 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 She began to feel ill yesterday and had sudden onset of subjective fever, chills, fatigue and cough. States approximately one week ago she had a stomach virus in which completely resolved until these new symptoms began. Has also had nasal congestion, runny nose and sore throat. Denies headache, visual changes, rashes. Endorses mild nausea but no abdominal pain, vomiting or diarrhea. Has been taking over-the-counter medications without resolution of symptoms. Also notes that she ran out of her amlodipine approximately 3 days ago. Denies any headaches, visual changes, lightheadedness, dizziness, chest pain, shortness of breath nor palpitations. Denies numbness, weakness or tingling.   Past Medical History:  Diagnosis Date  . Anxiety   . Asthma   . Cellulitis   . Chest pain   . Chronic back pain greater than 3 months duration   . Complication of anesthesia    difficult waking up "  . Depression   . Depression   . Eczema   . Hypertension   . Scoliosis     Patient Active Problem List   Diagnosis Date Noted  . Muscle weakness (generalized)   . Syncope and collapse 02/04/2016  . Essential hypertension 07/14/2015  . Cervical spondylolysis 06/18/2014  . Cervical cancer screening 12/10/2013  . Healthcare maintenance 12/10/2013  . Former smoker 12/10/2013  . GERD (gastroesophageal reflux disease) 12/10/2013  . Fatigue 09/09/2013  . Pain, joint, hand, right 05/20/2013  . Leg pain,  bilateral 04/22/2012  . Dyshidrotic eczema 12/26/2011  . Back pain 12/05/2010  . ALLERGIC RHINITIS 08/20/2008  . DYSFUNCTIONAL UTERINE BLEEDING 03/20/2007  . MIGRAINE HEADACHE 02/20/2007  . Anxiety and depression 01/23/2007    Past Surgical History:  Procedure Laterality Date  . TUBAL LIGATION      Prior to Admission medications   Medication Sig Start Date End Date Taking? Authorizing Provider  acetaminophen (TYLENOL) 500 MG tablet Take 2 tablets (1,000 mg total) by mouth every 6 (six) hours as needed for mild pain or fever. 06/06/16   Leo Grosser, MD  amLODipine (NORVASC) 10 MG tablet Take 1 tablet (10 mg total) by mouth daily. 06/21/16 07/21/16  Jami L Hagler, PA-C  ibuprofen (ADVIL,MOTRIN) 800 MG tablet Take 1 tablet (800 mg total) by mouth every 6 (six) hours as needed for fever or moderate pain. 06/06/16   Leo Grosser, MD  Multiple Vitamins-Minerals (MULTIVITAMIN WITH MINERALS) tablet Take 1 tablet by mouth daily.    Historical Provider, MD  oseltamivir (TAMIFLU) 75 MG capsule Take 1 capsule (75 mg total) by mouth 2 (two) times daily. 06/21/16   Jami L Hagler, PA-C  promethazine-dextromethorphan (PROMETHAZINE-DM) 6.25-15 MG/5ML syrup Take 5 mLs by mouth 4 (four) times daily as needed for cough. 06/21/16   Jami L Hagler, PA-C    Allergies Latex; Amoxicillin; Naproxen; and Tramadol  No family history on file.  Social History Social History  Substance Use Topics  . Smoking status: Former Smoker    Years: 14.00    Quit date: 09/20/2010  . Smokeless tobacco: Never Used  . Alcohol use Yes  Comment: occasional      Review of Systems  Constitutional: Positive subjective fevers, chills and fatigue. No rigors. Eyes: No visual changes.  ENT: Positive nasal congestion, runny nose, sore throat. No ear pain or ear drainage. Cardiovascular: No chest pain, Palpitations. Respiratory: As of cough, chest congestion. No shortness of breath. No wheezing.  Gastrointestinal: Positive nausea  without vomiting. No abdominal pain. No diarrhea.   Musculoskeletal: Positive for general myalgias.  Skin: Negative for rash. Neurological: Negative for headaches, focal weakness or numbness.No tingling. No lightheadedness, dizziness. 10-point ROS otherwise negative.  ____________________________________________   PHYSICAL EXAM:  VITAL SIGNS: ED Triage Vitals  Enc Vitals Group     BP 06/21/16 1419 (!) 203/93     Pulse Rate 06/21/16 1419 80     Resp 06/21/16 1419 18     Temp 06/21/16 1419 98.3 F (36.8 C)     Temp Source 06/21/16 1419 Oral     SpO2 06/21/16 1419 100 %     Weight 06/21/16 1419 160 lb (72.6 kg)     Height 06/21/16 1419 5\' 5"  (1.651 m)     Head Circumference --      Peak Flow --      Pain Score 06/21/16 1420 10     Pain Loc --      Pain Edu? --      Excl. in [place]? --      Constitutional: Alert and oriented. Well appearing and in no acute distress. Eyes: Conjunctivae are normal Without icterus, injection or discharge. Head: Atraumatic. ENT:      Ears: Bilateral external ear canals with significant cerumen that is not impacted that is obstructing bilateral TMs.      Nose: Mild congestion with bilateral clear rhinorrhea. Turbinates are injected with mild edema.      Mouth/Throat: Mucous membranes are moist. Pharynx without erythema, swelling, exudate. Uvula is midline. Airway is patent. Clear postnasal drainage. Neck: No stridor. No carotid bruits. Supple with full range of motion. Hematological/Lymphatic/Immunilogical: No cervical lymphadenopathy. Cardiovascular: Normal rate, regular rhythm. Normal S1 and S2.  Good peripheral circulation. Respiratory: Normal respiratory effort without tachypnea or retractions. Lungs CTAB with breath sounds noted in all lung fields. No wheeze, rhonchi, rales. Neurologic:  Normal speech and language. No gross focal neurologic deficits are appreciated.  Skin:  Skin is warm, dry and intact. No rash noted. Psychiatric: Mood and affect  are normal. Speech and behavior are normal. Patient exhibits appropriate insight and judgement.   ____________________________________________   LABS  None ____________________________________________  EKG  None ____________________________________________  RADIOLOGY  None ____________________________________________    PROCEDURES  Procedure(s) performed: None   Procedures   Medications - No data to display   ____________________________________________   INITIAL IMPRESSION / ASSESSMENT AND PLAN / ED COURSE  Pertinent labs & imaging results that were available during my care of the patient were reviewed by me and considered in my medical decision making (see chart for details).     Patient's diagnosis is consistent with influenza, hypertension and need for medication refill. Patient will be discharged home with prescriptions for Tamiflu, promethazine DM and amlodipine to take as directed. Patient was given a work note to excuse from work over the next 2-3 days.  Patient is to follow up with her primary care provider or [facility] if symptoms persist past this treatment course. Patient is given ED precautions to return to the ED for any worsening or new symptoms.   ____________________________________________  FINAL CLINICAL IMPRESSION(S) /  ED DIAGNOSES  Final diagnoses:  Influenza  Hypertension, unspecified type  Medication refill      NEW MEDICATIONS STARTED DURING THIS VISIT:  Discharge Medication List as of 06/21/2016  4:02 PM    START taking these medications   Details  oseltamivir (TAMIFLU) 75 MG capsule Take 1 capsule (75 mg total) by mouth 2 (two) times daily., Starting Thu 06/21/2016, Print    promethazine-dextromethorphan (PROMETHAZINE-DM) 6.25-15 MG/5ML syrup Take 5 mLs by mouth 4 (four) times daily as needed for cough., Starting Thu 06/21/2016, [place], PA-C 06/21/16 [place],  MD 06/21/16 [phone]

## 2016-06-21 NOTE — Discharge Instructions (Signed)
Rest. Push fluids such as water or gatorade.   Bland diet as tolerated.  May take OTC Tylenol or Motrin as needed.  Please take your blood pressure medication daily

## 2016-06-21 NOTE — ED Triage Notes (Signed)
Pt reports body aches, chills, cough, nasal congestion, runny nose and nausea for two days.

## 2016-06-21 NOTE — Telephone Encounter (Signed)
Needs refill on amlodipine.  [facility] [address] [facility]

## 2016-06-21 NOTE — Telephone Encounter (Signed)
Please call patient and ask her to make a follow up appointment with PCP. She has not been seen in clinic in a while.

## 2016-06-21 NOTE — ED Notes (Signed)
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

## 2016-06-22 MED ORDER — AMLODIPINE BESYLATE 10 MG PO TABS: 10.0000 mg | ORAL_TABLET | Freq: Every day | ORAL | 0 refills | Status: DC

## 2016-06-22 NOTE — Addendum Note (Signed)
Addended by: Smiley Houseman on: 06/22/2016 06:41 PM   Modules accepted: Orders

## 2016-06-22 NOTE — Telephone Encounter (Signed)
Rx sent 

## 2016-06-22 NOTE — Telephone Encounter (Signed)
Patient made an appt for 07-06-16.  States that she went to the ED yesterday for BP in the 200s on the top and also for flu.  Jazmin Hartsell,CMA

## 2016-07-05 NOTE — Progress Notes (Deleted)
   [facility] Phone: [phone]   Date of Visit: 07/06/2016   HPI:  HTN: - norvasc 10mg  daily    ROS: See HPI.  [place]:  PMH:  Migraine HA HTN Allergic Rhinitis GERD Dyshidrotic Eczema Cervical Spondylosis Back Pain Anxiety/Depression  Former Smoker  PHYSICAL EXAM: LMP 06/14/2016  Gen: *** HEENT: *** Heart: *** Lungs: *** Neuro: *** Ext: ***  ASSESSMENT/PLAN:  Health maintenance:  -***  No problem-specific Assessment & Plan notes found for this encounter.  FOLLOW UP: Follow up in *** for ***  Smiley Houseman, MD PGY [place]

## 2016-07-06 ENCOUNTER — Ambulatory Visit: Payer: Self-pay | Admitting: Internal Medicine

## 2016-11-02 ENCOUNTER — Ambulatory Visit: Payer: Self-pay | Admitting: Internal Medicine

## 2016-11-02 DIAGNOSIS — E663 Overweight: Secondary | ICD-10-CM | POA: Insufficient documentation

## 2016-11-02 NOTE — Progress Notes (Deleted)
   [facility] Phone: [phone]   Date of Visit: 11/02/2016   HPI:  ***  ROS: See HPI.  [place]:  PMH: Migraine HTN Hx of Syncope Allergic Rhinitis GERD Dyshidrotic Eczema Cervical spndylosis Dysfunctional Uterine Bleeding Anxiety and Depression Back Pain  Overweight  PHYSICAL EXAM: There were no vitals taken for this visit. Gen: *** HEENT: *** Heart: *** Lungs: *** Neuro: *** Ext: ***  ASSESSMENT/PLAN:  Health maintenance:  -***  No problem-specific Assessment & Plan notes found for this encounter.  FOLLOW UP: Follow up in *** for ***  Smiley Houseman, MD PGY [place]

## 2016-11-22 ENCOUNTER — Other Ambulatory Visit: Payer: Self-pay

## 2016-11-22 ENCOUNTER — Emergency Department (HOSPITAL_COMMUNITY)
Admission: EM | Admit: 2016-11-22 | Discharge: 2016-11-22 | Discharge status: Against Medical Advice | Payer: Medicaid Other | Attending: Emergency Medicine | Admitting: Emergency Medicine

## 2016-11-22 ENCOUNTER — Emergency Department (HOSPITAL_COMMUNITY): Payer: Medicaid Other

## 2016-11-22 ENCOUNTER — Encounter (HOSPITAL_COMMUNITY): Payer: Self-pay | Admitting: Emergency Medicine

## 2016-11-22 DIAGNOSIS — R0789 Other chest pain: Secondary | ICD-10-CM | POA: Insufficient documentation

## 2016-11-22 DIAGNOSIS — R0602 Shortness of breath: Secondary | ICD-10-CM | POA: Insufficient documentation

## 2016-11-22 DIAGNOSIS — Z5321 Procedure and treatment not carried out due to patient leaving prior to being seen by health care provider: Secondary | ICD-10-CM | POA: Insufficient documentation

## 2016-11-22 DIAGNOSIS — R51 Headache: Secondary | ICD-10-CM | POA: Insufficient documentation

## 2016-11-22 DIAGNOSIS — R079 Chest pain, unspecified: Secondary | ICD-10-CM

## 2016-11-22 LAB — CBC
HEMATOCRIT: 32.1 % — AB (ref 36.0–46.0)
Hemoglobin: 10.4 g/dL — ABNORMAL LOW (ref 12.0–15.0)
MCH: 24.8 pg — ABNORMAL LOW (ref 26.0–34.0)
MCHC: 32.4 g/dL (ref 30.0–36.0)
MCV: 76.4 fL — ABNORMAL LOW (ref 78.0–100.0)
Platelets: 389 10*3/uL (ref 150–400)
RBC: 4.2 MIL/uL (ref 3.87–5.11)
RDW: 16.3 % — AB (ref 11.5–15.5)
WBC: 8.4 10*3/uL (ref 4.0–10.5)

## 2016-11-22 LAB — CK TOTAL AND CKMB (NOT AT ARMC)
CK, MB: 1.6 ng/mL (ref 0.5–5.0)
RELATIVE INDEX: 0.9 (ref 0.0–2.5)
Total CK: 172 U/L (ref 38–234)

## 2016-11-22 LAB — COMPREHENSIVE METABOLIC PANEL
ALBUMIN: 3.9 g/dL (ref 3.5–5.0)
ALT: 14 U/L (ref 14–54)
AST: 23 U/L (ref 15–41)
Alkaline Phosphatase: 54 U/L (ref 38–126)
Anion gap: 10 (ref 5–15)
BUN: 13 mg/dL (ref 6–20)
CHLORIDE: 105 mmol/L (ref 101–111)
CO2: 21 mmol/L — AB (ref 22–32)
CREATININE: 0.91 mg/dL (ref 0.44–1.00)
Calcium: 9 mg/dL (ref 8.9–10.3)
GFR calc Af Amer: >60 mL/min (ref >60)
GFR calc non Af Amer: >60 mL/min (ref >60)
GLUCOSE: 96 mg/dL (ref 65–99)
Potassium: 3.6 mmol/L (ref 3.5–5.1)
SODIUM: 136 mmol/L (ref 135–145)
Total Bilirubin: 0.6 mg/dL (ref 0.3–1.2)
Total Protein: 7.2 g/dL (ref 6.5–8.1)

## 2016-11-22 LAB — I-STAT TROPONIN, ED: Troponin i, poc: 0 ng/mL (ref 0.00–0.08)

## 2016-11-22 LAB — TROPONIN I: Troponin I: <0.03 ng/mL (ref <0.03)

## 2016-11-22 NOTE — ED Triage Notes (Signed)
Triage completed during computer down time.  Pt reports a headache since yesterday and chest pain and SOB starting today.  Denies N/V/D,  LOC.

## 2016-12-23 ENCOUNTER — Encounter (HOSPITAL_COMMUNITY): Payer: Self-pay

## 2016-12-23 ENCOUNTER — Emergency Department (HOSPITAL_COMMUNITY)
Admission: EM | Admit: 2016-12-23 | Discharge: 2016-12-23 | Disposition: A | Discharge status: Home / Self Care | Payer: Medicaid Other | Attending: Emergency Medicine | Admitting: Emergency Medicine

## 2016-12-23 DIAGNOSIS — J45909 Unspecified asthma, uncomplicated: Secondary | ICD-10-CM | POA: Insufficient documentation

## 2016-12-23 DIAGNOSIS — Z79899 Other long term (current) drug therapy: Secondary | ICD-10-CM | POA: Insufficient documentation

## 2016-12-23 DIAGNOSIS — M791 Myalgia: Secondary | ICD-10-CM | POA: Insufficient documentation

## 2016-12-23 DIAGNOSIS — M7918 Myalgia, other site: Secondary | ICD-10-CM

## 2016-12-23 DIAGNOSIS — Z91040 Latex allergy status: Secondary | ICD-10-CM | POA: Insufficient documentation

## 2016-12-23 DIAGNOSIS — I10 Essential (primary) hypertension: Secondary | ICD-10-CM | POA: Insufficient documentation

## 2016-12-23 DIAGNOSIS — Z87891 Personal history of nicotine dependence: Secondary | ICD-10-CM | POA: Insufficient documentation

## 2016-12-23 LAB — URINALYSIS, ROUTINE W REFLEX MICROSCOPIC
Bilirubin Urine: NEGATIVE
GLUCOSE, UA: NEGATIVE mg/dL
Hgb urine dipstick: NEGATIVE
Ketones, ur: 20 mg/dL — AB
LEUKOCYTES UA: NEGATIVE
Nitrite: NEGATIVE
PH: 5 (ref 5.0–8.0)
Protein, ur: NEGATIVE mg/dL
Specific Gravity, Urine: 1.029 (ref 1.005–1.030)

## 2016-12-23 LAB — COMPREHENSIVE METABOLIC PANEL
ALK PHOS: 53 U/L (ref 38–126)
ALT: 15 U/L (ref 14–54)
AST: 21 U/L (ref 15–41)
Albumin: 4 g/dL (ref 3.5–5.0)
Anion gap: 10 (ref 5–15)
BILIRUBIN TOTAL: 0.6 mg/dL (ref 0.3–1.2)
BUN: 11 mg/dL (ref 6–20)
CALCIUM: 8.9 mg/dL (ref 8.9–10.3)
CO2: 18 mmol/L — ABNORMAL LOW (ref 22–32)
CREATININE: 0.8 mg/dL (ref 0.44–1.00)
Chloride: 110 mmol/L (ref 101–111)
Glucose, Bld: 76 mg/dL (ref 65–99)
Potassium: 4.3 mmol/L (ref 3.5–5.1)
Sodium: 138 mmol/L (ref 135–145)
TOTAL PROTEIN: 7.4 g/dL (ref 6.5–8.1)

## 2016-12-23 LAB — CBC
HCT: 36.7 % (ref 36.0–46.0)
Hemoglobin: 12.1 g/dL (ref 12.0–15.0)
MCH: 25.4 pg — ABNORMAL LOW (ref 26.0–34.0)
MCHC: 33 g/dL (ref 30.0–36.0)
MCV: 77.1 fL — ABNORMAL LOW (ref 78.0–100.0)
PLATELETS: 401 10*3/uL — AB (ref 150–400)
RBC: 4.76 MIL/uL (ref 3.87–5.11)
RDW: 17.2 % — ABNORMAL HIGH (ref 11.5–15.5)
WBC: 7.7 10*3/uL (ref 4.0–10.5)

## 2016-12-23 LAB — LIPASE, BLOOD: LIPASE: 27 U/L (ref 11–51)

## 2016-12-23 MED ORDER — LORAZEPAM 2 MG/ML IJ SOLN
1.0000 mg | Freq: Once | INTRAMUSCULAR | Status: AC
  Administered 2016-12-23: 1 mg via INTRAVENOUS
  Filled 2016-12-23: qty 1

## 2016-12-23 MED ORDER — METHOCARBAMOL 750 MG PO TABS: 750.0000 mg | ORAL_TABLET | Freq: Four times a day (QID) | ORAL | 0 refills | Status: DC

## 2016-12-23 NOTE — ED Triage Notes (Signed)
Patient is c/o abdominal pain and back pain started 2 days ago.

## 2016-12-23 NOTE — ED Provider Notes (Signed)
[place] DEPT Provider Note   CSN: [id number] Arrival date & time: 12/23/16  1554     History   Chief Complaint No chief complaint on file.   HPI Susan Sanders is a 46 y.o. female.  46 year old female presents with 3 days of left-sided back pain characterized as sharp and better with movement. No radicular symptoms. Denies any dysuria or hematuria. No fever or chills. Does work as a Psychologist, counselling. No rashes noted. No abdominal discomfort. Pain better with remaining still. No treatment used prior to arrival      Past Medical History:  Diagnosis Date  . Anxiety   . Asthma   . Cellulitis   . Chest pain   . Chronic back pain greater than 3 months duration   . Complication of anesthesia    difficult waking up "  . Depression   . Depression   . Eczema   . Hypertension   . Scoliosis     Patient Active Problem List   Diagnosis Date Noted  . Overweight 11/02/2016  . Muscle weakness (generalized)   . Syncope and collapse 02/04/2016  . Essential hypertension 07/14/2015  . Cervical spondylolysis 06/18/2014  . Cervical cancer screening 12/10/2013  . Healthcare maintenance 12/10/2013  . Former smoker 12/10/2013  . GERD (gastroesophageal reflux disease) 12/10/2013  . Fatigue 09/09/2013  . Pain, joint, hand, right 05/20/2013  . Leg pain, bilateral 04/22/2012  . Dyshidrotic eczema 12/26/2011  . Back pain 12/05/2010  . ALLERGIC RHINITIS 08/20/2008  . DYSFUNCTIONAL UTERINE BLEEDING 03/20/2007  . MIGRAINE HEADACHE 02/20/2007  . Anxiety and depression 01/23/2007    Past Surgical History:  Procedure Laterality Date  . TUBAL LIGATION      OB History    Gravida Para Term Preterm AB Living   7 6 4 2 1 6    SAB TAB Ectopic Multiple Live Births       1           Home Medications    Prior to Admission medications   Medication Sig Start Date End Date Taking? Authorizing Provider  acetaminophen (TYLENOL) 500 MG tablet Take 2 tablets (1,000 mg total) by mouth  every 6 (six) hours as needed for mild pain or fever. 06/06/16   Leo Grosser, MD  amLODipine (NORVASC) 10 MG tablet Take 1 tablet (10 mg total) by mouth daily. 06/22/16 07/22/16  Smiley Houseman, MD  ibuprofen (ADVIL,MOTRIN) 800 MG tablet Take 1 tablet (800 mg total) by mouth every 6 (six) hours as needed for fever or moderate pain. 06/06/16   Leo Grosser, MD  Multiple Vitamins-Minerals (MULTIVITAMIN WITH MINERALS) tablet Take 1 tablet by mouth daily.    [provider]  oseltamivir (TAMIFLU) 75 MG capsule Take 1 capsule (75 mg total) by mouth 2 (two) times daily. 06/21/16   Hagler, Jami L, PA-C  promethazine-dextromethorphan (PROMETHAZINE-DM) 6.25-15 MG/5ML syrup Take 5 mLs by mouth 4 (four) times daily as needed for cough. 06/21/16   Hagler, Judithe Modest, PA-C    Family History History reviewed. No pertinent family history.  Social History Social History  Substance Use Topics  . Smoking status: Former Smoker    Years: 14.00    Quit date: 09/20/2010  . Smokeless tobacco: Never Used  . Alcohol use Yes     Comment: occasional      Allergies   Latex; Amoxicillin; Naproxen; and Tramadol   Review of Systems Review of Systems  All other systems reviewed and are negative.    Physical Exam  Updated Vital Signs BP (!) 185/84 (BP Location: Left Arm)   Pulse 68   Temp 98.6 F (37 C) (Oral)   Resp 18   SpO2 100%   Physical Exam  Constitutional: She is oriented to person, place, and time. She appears well-developed and well-nourished.  Non-toxic appearance. No distress.  HENT:  Head: Normocephalic and atraumatic.  Eyes: Pupils are equal, round, and reactive to light. Conjunctivae, EOM and lids are normal.  Neck: Normal range of motion. Neck supple. No tracheal deviation present. No thyroid mass present.  Cardiovascular: Normal rate, regular rhythm and normal heart sounds.  Exam reveals no gallop.   No murmur heard. Pulmonary/Chest: Effort normal and breath sounds normal. No  stridor. No respiratory distress. She has no decreased breath sounds. She has no wheezes. She has no rhonchi. She has no rales.  Abdominal: Soft. Normal appearance and bowel sounds are normal. She exhibits no distension. There is no tenderness. There is no rigidity, no rebound, no guarding and no CVA tenderness.  Musculoskeletal: Normal range of motion. She exhibits no edema or tenderness.       Arms: Neurological: She is alert and oriented to person, place, and time. She has normal strength. No cranial nerve deficit or sensory deficit. GCS eye subscore is 4. GCS verbal subscore is 5. GCS motor subscore is 6.  Skin: Skin is warm and dry. No abrasion and no rash noted.  Psychiatric: She has a normal mood and affect. Her speech is normal and behavior is normal.  Nursing note and vitals reviewed.    ED Treatments / Results  Labs (all labs ordered are listed, but only abnormal results are displayed) Labs Reviewed  LIPASE, BLOOD  COMPREHENSIVE METABOLIC PANEL  CBC  URINALYSIS, ROUTINE W REFLEX MICROSCOPIC    EKG  EKG Interpretation None       Radiology No results found.  Procedures Procedures (including critical care time)  Medications Ordered in ED Medications - No data to display   Initial Impression / Assessment and Plan / ED Course  I have reviewed the triage vital signs and the nursing notes.  Pertinent labs & imaging results that were available during my care of the patient were reviewed by me and considered in my medical decision making (see chart for details).     Patient's lab work review is reassuring. She does have multiple areas of pinpoint tenderness and will be treated with muscle relaxants. Return precautions given  Final Clinical Impressions(s) / ED Diagnoses   Final diagnoses:  None    New Prescriptions New Prescriptions   No medications on file     Lacretia Leigh, MD 12/23/16 2124

## 2016-12-23 NOTE — ED Notes (Signed)
Patient called for lab work and room assignment @ the same time. RN made aware and will collect labs.

## 2017-01-09 NOTE — Progress Notes (Deleted)
   [facility] Phone: [phone]   Date of Visit: 01/11/2017   HPI:  Patient presents today for a well woman exam.   Concerns today: *** Periods: *** Contraception: *** Pelvic symptoms: *** Sexual activity: *** STD Screening: *** Pap smear status: *** Exercise: *** Diet: *** Smoking: *** Alcohol: *** Drugs: *** Advance directives: *** Mood: *** Dentist: ***  ROS: See HPI  [place]:  PMH: Migraine HTN Hx of Syncope Allergic Rhinitis GERD Dyshidrotic Eczema Dysfunctional Uterine Bleeding Anxiety/Depression Overweight  Cancers in family: ***  PHYSICAL EXAM: There were no vitals taken for this visit. Gen: NAD, pleasant, cooperative HEENT: NCAT, PERRL, no palpable thyromegaly or anterior cervical lymphadenopathy Heart: RRR, no murmurs Lungs: CTAB, NWOB Abdomen: soft, nontender to palpation Neuro: grossly nonfocal, speech normal GU: normal appearing external genitalia without lesions. Vagina is moist with white discharge. Cervix normal in appearance. No cervical motion tenderness or tenderness on bimanual exam. No adnexal masses.   ASSESSMENT/PLAN:  # Health maintenance:  -STD screening: *** -pap smear: *** -mammogram: *** -lipid screening: *** -DEXA: *** -immunizations: *** -advance directives: *** -handout given on health maintenance topics  FOLLOW UP: Follow up in *** for ***  Smiley Houseman, MD PGY [place]

## 2017-01-11 ENCOUNTER — Encounter: Payer: Self-pay | Admitting: Internal Medicine

## 2017-04-14 ENCOUNTER — Telehealth (HOSPITAL_COMMUNITY): Payer: Self-pay | Admitting: *Deleted

## 2017-04-14 ENCOUNTER — Encounter (HOSPITAL_COMMUNITY): Payer: Self-pay | Admitting: Emergency Medicine

## 2017-04-14 ENCOUNTER — Ambulatory Visit (HOSPITAL_COMMUNITY)
Admission: EM | Admit: 2017-04-14 | Discharge: 2017-04-14 | Disposition: A | Discharge status: Home / Self Care | Payer: Self-pay | Attending: Urgent Care | Admitting: Urgent Care

## 2017-04-14 DIAGNOSIS — M545 Low back pain, unspecified: Secondary | ICD-10-CM

## 2017-04-14 DIAGNOSIS — I10 Essential (primary) hypertension: Secondary | ICD-10-CM

## 2017-04-14 DIAGNOSIS — R030 Elevated blood-pressure reading, without diagnosis of hypertension: Secondary | ICD-10-CM

## 2017-04-14 DIAGNOSIS — Z8739 Personal history of other diseases of the musculoskeletal system and connective tissue: Secondary | ICD-10-CM

## 2017-04-14 MED ORDER — PREDNISONE 20 MG PO TABS: ORAL_TABLET | ORAL | 0 refills | Status: DC

## 2017-04-14 MED ORDER — AMLODIPINE BESYLATE 10 MG PO TABS: 10.0000 mg | ORAL_TABLET | Freq: Every day | ORAL | 0 refills | Status: DC

## 2017-04-14 NOTE — Telephone Encounter (Signed)
tansmission of Rxs failed.  Request resend.

## 2017-04-14 NOTE — ED Provider Notes (Signed)
    MRN: [id number] DOB: [birth year]  Subjective:   Susan Sanders is a 46 y.o. female presenting for 2 day history of sharp, constant low back pain that radiates to the right leg and occasionally into her flank sides bilaterally. 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 Has tried ibuprofen and APAP with minimal relief. Has used icing, heat pads. Denies fever, weakness, numbness or tingling, incontinence, dysuria, hematuria, falls, trauma. Has a history of scoliosis. Patient has HTN, does not have any of her blood pressure medication, is supposed to be on amlodipine. Plans on setting up an appointment for her blood pressure follow up. Denies chest pain, abdominal pain.  Susan Sanders is allergic to latex; amoxicillin; naproxen; and tramadol.  Susan Sanders  has a past medical history of Anxiety, Asthma, Cellulitis, Chest pain, Chronic back pain greater than 3 months duration, Complication of anesthesia, Depression, Depression, Eczema, Hypertension, and Scoliosis. Also  has a past surgical history that includes Tubal ligation.  Objective:   Vitals: BP (!) 188/104 (BP Location: Left Arm)   Pulse 66   Temp 98.2 F (36.8 C) (Oral)   Resp 20   LMP 04/07/2017   SpO2 100%   BP Readings from Last 3 Encounters:  04/14/17 (!) 188/104  12/23/16 (!) 166/67  11/22/16 (!) 176/88    Physical Exam  Constitutional: She is oriented to person, place, and time. She appears well-developed and well-nourished.  Cardiovascular: Normal rate, regular rhythm and intact distal pulses. Exam reveals no gallop and no friction rub.  No murmur heard. Pulmonary/Chest: No respiratory distress. She has no wheezes. She has no rales.  Musculoskeletal:       Lumbar back: She exhibits decreased range of motion (flexion, extension) and tenderness. She exhibits no bony tenderness, no swelling, no edema, no deformity, no laceration and no spasm.       Back:  Neurological: She is alert and  oriented to person, place, and time. She displays normal reflexes. Coordination (hunched over, rising very slowly from seated position) abnormal.  Skin: Skin is warm and dry.   Assessment and Plan :   Acute bilateral low back pain without sciatica  History of scoliosis  Elevated blood pressure reading  Essential hypertension   Will restart patient on amlodipine so that she can also take prednisone for her severe back pain. Counseled patient on potential for adverse effects with medications prescribed today, patient verbalized understanding. Return-to-clinic precautions discussed, patient verbalized understanding.   Jaynee Eagles, PA-C [facility]  04/14/2017  12:24 PM    Jaynee Eagles, PA-C 04/14/17 1248

## 2017-04-14 NOTE — ED Triage Notes (Signed)
PT C/O: lower back pain... 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 Also needing note for work.   ONSET: 3 days  SX ALSO INCLUDE: pain increases w/activity  DENIES: inj/trauma, urinary sx  TAKING MEDS: Ibuprofen w/no relief and acetaminophen  A&O x4... NAD... Ambulatory

## 2017-04-14 NOTE — Discharge Instructions (Signed)
You may take 500mg  Tylenol every 6 hours for pain and inflammation of your back. Do not take ibuprofen while taking prednisone. Make sure you follow up with your PCP to continue managing your high blood pressure.

## 2017-04-23 NOTE — Progress Notes (Deleted)
   [facility] Phone: [phone]   Date of Visit: 04/25/2017   HPI:  ***  ROS: See HPI.  [place]: PMH: Migraine HTN Allergic Rhinitis GERD Dyshidrotic Eczema Cervical Spondylolysis/Back Pain Anxiety and Depression Overweight Former Smoker     PHYSICAL EXAM: LMP 04/07/2017  Gen: *** HEENT: *** Heart: *** Lungs: *** Neuro: *** Ext: ***  ASSESSMENT/PLAN:  Health maintenance:  -***  No problem-specific Assessment & Plan notes found for this encounter.  FOLLOW UP: Follow up in *** for ***  Smiley Houseman, MD PGY [place]

## 2017-04-25 ENCOUNTER — Ambulatory Visit: Payer: Medicaid Other | Admitting: Internal Medicine

## 2017-05-03 ENCOUNTER — Encounter (HOSPITAL_COMMUNITY): Payer: Self-pay

## 2017-05-03 ENCOUNTER — Other Ambulatory Visit: Payer: Self-pay

## 2017-05-03 ENCOUNTER — Emergency Department (HOSPITAL_COMMUNITY)
Admission: EM | Admit: 2017-05-03 | Discharge: 2017-05-03 | Disposition: A | Discharge status: Home / Self Care | Payer: Medicaid Other | Attending: Emergency Medicine | Admitting: Emergency Medicine

## 2017-05-03 ENCOUNTER — Emergency Department (HOSPITAL_COMMUNITY): Payer: Medicaid Other

## 2017-05-03 DIAGNOSIS — Y990 Civilian activity done for income or pay: Secondary | ICD-10-CM | POA: Insufficient documentation

## 2017-05-03 DIAGNOSIS — Y9389 Activity, other specified: Secondary | ICD-10-CM | POA: Insufficient documentation

## 2017-05-03 DIAGNOSIS — W01198A Fall on same level from slipping, tripping and stumbling with subsequent striking against other object, initial encounter: Secondary | ICD-10-CM | POA: Insufficient documentation

## 2017-05-03 DIAGNOSIS — Z91040 Latex allergy status: Secondary | ICD-10-CM | POA: Insufficient documentation

## 2017-05-03 DIAGNOSIS — I10 Essential (primary) hypertension: Secondary | ICD-10-CM | POA: Insufficient documentation

## 2017-05-03 DIAGNOSIS — Z87891 Personal history of nicotine dependence: Secondary | ICD-10-CM | POA: Insufficient documentation

## 2017-05-03 DIAGNOSIS — G8929 Other chronic pain: Secondary | ICD-10-CM | POA: Insufficient documentation

## 2017-05-03 DIAGNOSIS — Z79899 Other long term (current) drug therapy: Secondary | ICD-10-CM | POA: Insufficient documentation

## 2017-05-03 DIAGNOSIS — S63501A Unspecified sprain of right wrist, initial encounter: Secondary | ICD-10-CM

## 2017-05-03 DIAGNOSIS — Y9289 Other specified places as the place of occurrence of the external cause: Secondary | ICD-10-CM | POA: Insufficient documentation

## 2017-05-03 DIAGNOSIS — J45909 Unspecified asthma, uncomplicated: Secondary | ICD-10-CM | POA: Insufficient documentation

## 2017-05-03 MED ORDER — ACETAMINOPHEN 325 MG PO TABS
650.0000 mg | ORAL_TABLET | Freq: Once | ORAL | Status: AC
  Administered 2017-05-03: 650 mg via ORAL
  Filled 2017-05-03: qty 2

## 2017-05-03 NOTE — ED Notes (Signed)
Pt verbalizes understanding of d/c paperwork, follow up instructions. Pt A/O x4, ambulatory. All belongings with patient upon departure.  

## 2017-05-03 NOTE — ED Notes (Signed)
Clicked off DG Wrist Complete Right on accident

## 2017-05-03 NOTE — Discharge Instructions (Signed)
Wear wrist brace for at least 2 weeks for stabilization of wrist. Ice and elevate wrist throughout the day, using ice pack for no more than 20 minutes every hour.  Alternate between tylenol and motrin for pain relief. Call hand specialist follow up today or tomorrow to schedule followup appointment for recheck of ongoing wrist pain in 1-2 weeks that can be canceled with a 24-48 hour notice if complete resolution of pain. Return to the ER for changes or worsening symptoms.

## 2017-05-03 NOTE — ED Provider Notes (Signed)
[facility] DEPT Provider Note   CSN: [id number] Arrival date & time: 05/03/17  1816     History   Chief Complaint Chief Complaint  Patient presents with  . Wrist Pain    HPI Susan Sanders is a 46 y.o. female with a PMHx of HTN, chronic back pain, anxiety, asthma, scoliosis, migraines, and other conditions listed below, who presents to the ED with complaints of right wrist pain that began after an incident at work yesterday.  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  Since then she has had 8/10 constant aching and tight nonradiating right wrist pain, which worsens with movement of the wrist, was moderately improved with Tylenol, ice, and prednisone that she had left over from a prior back issue, and was unrelieved with heat.  She denies any swelling, bruising, numbness, tingling, focal weakness, other injury sustained, head injury, LOC, or any other complaints at this time.   The history is provided by the patient and medical records. No language interpreter was used.  Wrist Pain  This is a new problem. The current episode started yesterday. The problem occurs constantly. The problem has not changed since onset.Exacerbated by: movement of wrist. The symptoms are relieved by ice and acetaminophen (and prednisone). She has tried acetaminophen, a cold compress and a warm compress (and prednisone) for the symptoms. The treatment provided moderate relief.    Past Medical History:  Diagnosis Date  . Anxiety   . Asthma   . Cellulitis   . Chest pain   . Chronic back pain greater than 3 months duration   . Complication of anesthesia    difficult waking up "  . Depression   . Depression   . Eczema   . Hypertension   . Scoliosis     Patient Active Problem List   Diagnosis Date Noted  . Overweight 11/02/2016  . Muscle weakness (generalized)   .  Syncope and collapse 02/04/2016  . Essential hypertension 07/14/2015  . Cervical spondylolysis 06/18/2014  . Healthcare maintenance 12/10/2013  . Former smoker 12/10/2013  . GERD (gastroesophageal reflux disease) 12/10/2013  . Fatigue 09/09/2013  . Pain, joint, hand, right 05/20/2013  . Leg pain, bilateral 04/22/2012  . Dyshidrotic eczema 12/26/2011  . Back pain 12/05/2010  . ALLERGIC RHINITIS 08/20/2008  . DYSFUNCTIONAL UTERINE BLEEDING 03/20/2007  . MIGRAINE HEADACHE 02/20/2007  . Anxiety and depression 01/23/2007    Past Surgical History:  Procedure Laterality Date  . TUBAL LIGATION      OB History    Gravida Para Term Preterm AB Living   7 6 4 2 1 6    SAB TAB Ectopic Multiple Live Births       1           Home Medications    Prior to Admission medications   Medication Sig Start Date End Date Taking? Authorizing Provider  acetaminophen (TYLENOL) 500 MG tablet Take 2 tablets (1,000 mg total) by mouth every 6 (six) hours as needed for mild pain or fever. 06/06/16   Leo Grosser, MD  amLODipine (NORVASC) 10 MG tablet Take 1 tablet (10 mg total) by mouth daily. 04/14/17 05/14/17  Jaynee Eagles, PA-C  Aspirin-Acetaminophen-Caffeine (GOODY HEADACHE PO) Take 1 packet by mouth daily as needed (pain).    [provider]  ibuprofen (ADVIL,MOTRIN) 800 MG tablet Take 1 tablet (800  mg total) by mouth every 6 (six) hours as needed for fever or moderate pain. 06/06/16   Leo Grosser, MD  methocarbamol (ROBAXIN-750) 750 MG tablet Take 1 tablet (750 mg total) by mouth 4 (four) times daily. 12/23/16   Lacretia Leigh, MD  oseltamivir (TAMIFLU) 75 MG capsule Take 1 capsule (75 mg total) by mouth 2 (two) times daily. Patient not taking: Reported on 12/23/2016 06/21/16   Hagler, Wende Crease L, PA-C  predniSONE (DELTASONE) 20 MG tablet Take 2 tablets daily with breakfast. 04/14/17   Jaynee Eagles, PA-C  promethazine-dextromethorphan (PROMETHAZINE-DM) 6.25-15 MG/5ML syrup Take 5 mLs by mouth 4 (four)  times daily as needed for cough. Patient not taking: Reported on 12/23/2016 06/21/16   Hagler, Judithe Modest, PA-C    Family History Family History  Problem Relation Age of Onset  . Hypertension Mother     Social History Social History   Tobacco Use  . Smoking status: Former Smoker    Years: 14.00    Last attempt to quit: 09/20/2010    Years since quitting: 6.6  . Smokeless tobacco: Never Used  Substance Use Topics  . Alcohol use: Yes    Comment: occasional   . Drug use: No     Allergies   Latex; Amoxicillin; Naproxen; and Tramadol   Review of Systems Review of Systems  HENT: Negative for facial swelling (no head inj).   Musculoskeletal: Positive for arthralgias. Negative for joint swelling.  Skin: Negative for color change.  Allergic/Immunologic: Negative for immunocompromised state.  Neurological: Negative for syncope, weakness and numbness.  Psychiatric/Behavioral: Negative for confusion.     Physical Exam Updated Vital Signs BP (!) 154/86 (BP Location: Left Arm)   Pulse 83   Temp 98.3 F (36.8 C) (Oral)   Resp 18   LMP 04/07/2017   SpO2 100%   Physical Exam  Constitutional: She is oriented to person, place, and time. Vital signs are normal. She appears well-developed and well-nourished.  Non-toxic appearance. No distress.  Afebrile, nontoxic, NAD  HENT:  Head: Normocephalic and atraumatic.  Mouth/Throat: Mucous membranes are normal.  Eyes: Conjunctivae and EOM are normal. Right eye exhibits no discharge. Left eye exhibits no discharge.  Neck: Normal range of motion. Neck supple.  Cardiovascular: Normal rate and intact distal pulses.  Pulmonary/Chest: Effort normal. No respiratory distress.  Abdominal: Normal appearance. She exhibits no distension.  Musculoskeletal: Normal range of motion.       Right elbow: Normal.      Right wrist: She exhibits tenderness and bony tenderness. She exhibits normal range of motion, no swelling, no effusion, no crepitus and no  deformity.  R wrist with FROM intact, with mild TTP to the distal radius aspect, no crepitus or deformity, no anatomical snuffbox tenderness, no swelling or bruising. Strength and sensation grossly intact, distal pulses intact, compartments soft.  No tenderness to remainder of RUE. FROM intact at all major joints.   Neurological: She is alert and oriented to person, place, and time. She has normal strength. No sensory deficit.  Skin: Skin is warm, dry and intact. No rash noted.  Psychiatric: She has a normal mood and affect. Her behavior is normal.  Nursing note and vitals reviewed.    ED Treatments / Results  Labs (all labs ordered are listed, but only abnormal results are displayed) Labs Reviewed - No data to display  EKG  EKG Interpretation None       Radiology Dg Wrist Complete Right  Result Date: 05/03/2017 CLINICAL DATA:  Injured  wrist catching a falling person.  Pain. EXAM: RIGHT WRIST - COMPLETE 3+ VIEW COMPARISON:  None. FINDINGS: There is no evidence of fracture or dislocation. There is no evidence of arthropathy or other focal bone abnormality. Soft tissues are unremarkable. IMPRESSION: Negative. Electronically Signed   By: Staci Righter M.D.   On: 05/03/2017 19:41    Procedures Procedures (including critical care time)  Medications Ordered in ED Medications  acetaminophen (TYLENOL) tablet 650 mg (not administered)     Initial Impression / Assessment and Plan / ED Course  I have reviewed the triage vital signs and the nursing notes.  Pertinent labs & imaging results that were available during my care of the patient were reviewed by me and considered in my medical decision making (see chart for details).     46 y.o. female here with R wrist pain after injury at work yesterday. Tried to catch pt from falling, grabbed bed rail and felt a pop in her wrist. On exam, no swelling or bruising, no crepitus or deformity, mild tenderness to distal radius area, no  anatomical snuffbox tenderness, no other areas of tenderness to remainder of arm, NVI with soft compartments. Likely just ligamentous/tendon strain, but will get xray to r/o possible fx. Pt declines wanting anything for pain. Will reassess shortly.   8:05 PM Xray negative for acute bony abnormality, as expected. Likely just a strain of muscle/tendon/ligament. Will provide splint for rest x1wk, then use for comfort thereafter, discussed RICE, tylenol/motrin for pain, and f/up with hand specialist in 1wk for recheck of symptoms. Pt requesting tylenol before she leaves, will provide this now. I explained the diagnosis and have given explicit precautions to return to the ER including for any other new or worsening symptoms. The patient understands and accepts the medical plan as it's been dictated and I have answered their questions. Discharge instructions concerning home care and prescriptions have been given. The patient is STABLE and is discharged to home in good condition.    Final Clinical Impressions(s) / ED Diagnoses   Final diagnoses:  Sprain of right wrist, initial encounter    ED Discharge Orders    [address] [place], [place] 05/03/17 Rowe Clack, MD 05/04/17 [phone]

## 2017-05-03 NOTE — ED Triage Notes (Addendum)
Pt c/o right wrist/hand pain. 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 Felt a pop. No swelling or deformity noted. A/Ox4, ambulatory.

## 2017-07-23 NOTE — Progress Notes (Addendum)
[facility] Phone: [phone]   Date of Visit: 07/24/2017   HPI:  Patient initially presented for annual physical, but she had a few concerns she wanted to address.   Menorrhagia:  - reports of 1 year history of menorrhagia. Reports of using 10 pads a day during her period. She also reports of clots and more painful cramps which is new.  - her periods are regular and come on monthly. Last 5-7 days  - has recent history of smoking - sexually active with one long term partner - no vaginal discharge - reports of vaginal odor intermittently during her periods for the past 2-3 months   Syncopal Episode:  - reports that she had a syncopal episode about 4 days ago. She was at work and did not feel well. She felt "hot and lightheaded". She sat down while her colleague went to get blood pressure cuff. She reports she passed out while sitting and slid to the floor. She may have been unconscious "for a few minutes". Her coworkers did not notice any abnormal body movements. She reports of slight urinary incontinence. No bowel incontinence. EMS was called and reported that her blood pressure was in the 200s/100. They recommended further evaluation in the ED, but patient declined.  - She had one other episode of syncope in 01/2016. Per chart review, it seemed that the patient may not have lost consciousness per her daughter. Either way, neurology was consulted. Etiology was not certain but "seizure and stroke seemed unlikely. Hypoglycemia, arrhythmia, or hypertension seemed likely culprit" ECHO was unremarkable. In the DC summary it was noted that patient should not drive for 6 months after episode.    - she has a history of HTN and is currently on Norvasc 10mg  daily. She reports of compliance with the medication.   ROS: See HPI.  [place]:  Migraine  Hx Syncope HTN GERD Dysfunctional Uterine Bleeding Former Smoker  PHYSICAL EXAM: BP (!) 170/84   Pulse 83   Temp 98.3 F  (36.8 C) (Oral)   Wt 192 lb (87.1 kg)   LMP 07/20/2017   SpO2 99%   BMI 31.95 kg/m  GEN: NAD, nontoxic appearing  HEENT: Atraumatic, normocephalic, neck supple, EOMI, sclera clear, PERRL CV: RRR, no murmurs, rubs, or gallops PULM: CTAB, normal effort ABD: Soft, nontender, nondistended, NABS, no organomegaly GU: Female genitalia: normal external genitalia, vulva, vagina, cervix, uterus and adnexa. White creamy like discharge noted. No cervical motion tenderness.  SKIN: No rash or cyanosis; warm and well-perfused EXTR: No lower extremity edema or calf tenderness PSYCH: Mood and affect euthymic, normal rate and volume of speech NEURO: Awake, alert, CN2-12 intact, strength normal in upper and lower extremities bilaterally, normal sensation to light touch in upper and lower extremities bilaterally. no focal deficits grossly, normal speech   ASSESSMENT/PLAN:  Menorrhagia with regular cycle Reports of worsening menorrhagia with clots for the past year. She does have a history of uterine fibroids noted on an old CT abdomen which may be contributing to her symptoms. Due to her age and smoking history, would benefit from further evaluation. Will order pelvic ultrasound. She may need endometrial biopsy regardless of the ultrasound findings. Will also obtain basic labs and TSH. Will also screen for STI with wet prep, GC/Chlamyida, HIV, RPR.   Syncope and collapse Her description of the episode seems to have prodromal symptoms which would most likely mean this is more vasovagal, but she was sitting down when this happened. Prodromal symptoms makes  cardiac etiology less likely. However, when we reviewed her prior EKGs there seems to be slurred upstroke to the QRS. EKG today seems to have similar findings. Therefore, will make an urgent referral to cardiology for further evaluation of this. Unlikely seizure or orthostatic symptoms. Possibly due to elevated BP? Will obtain CBC, CMP, TSH to further evaluate.    Essential hypertension BP initially elevated to 150s for SBP. On repeat was 170. Compliant with norvasc. Continue Norvasc 10mg  daily. Add HCTZ 12.5 mg daily.   Follow up in 1-2 weeks  Precepted with attending   Smiley Houseman, MD PGY [place]

## 2017-07-24 ENCOUNTER — Ambulatory Visit (HOSPITAL_COMMUNITY)
Admission: RE | Admit: 2017-07-24 | Discharge: 2017-07-24 | Disposition: A | Discharge status: Home / Self Care | Payer: Medicaid Other | Source: Ambulatory Visit | Attending: Family Medicine | Admitting: Family Medicine

## 2017-07-24 ENCOUNTER — Other Ambulatory Visit: Payer: Self-pay

## 2017-07-24 ENCOUNTER — Encounter: Payer: Self-pay | Admitting: Internal Medicine

## 2017-07-24 ENCOUNTER — Telehealth: Payer: Self-pay | Admitting: Internal Medicine

## 2017-07-24 ENCOUNTER — Ambulatory Visit (INDEPENDENT_AMBULATORY_CARE_PROVIDER_SITE_OTHER): Payer: Self-pay | Admitting: Internal Medicine

## 2017-07-24 ENCOUNTER — Other Ambulatory Visit (HOSPITAL_COMMUNITY)
Admission: RE | Admit: 2017-07-24 | Discharge: 2017-07-24 | Disposition: A | Discharge status: Home / Self Care | Payer: Medicaid Other | Source: Ambulatory Visit | Attending: Family Medicine | Admitting: Family Medicine

## 2017-07-24 VITALS — BP 170/84 | HR 83 | Temp 98.3°F | Wt 192.0 lb

## 2017-07-24 DIAGNOSIS — R718 Other abnormality of red blood cells: Secondary | ICD-10-CM

## 2017-07-24 DIAGNOSIS — Z113 Encounter for screening for infections with a predominantly sexual mode of transmission: Secondary | ICD-10-CM

## 2017-07-24 DIAGNOSIS — R55 Syncope and collapse: Secondary | ICD-10-CM

## 2017-07-24 DIAGNOSIS — I10 Essential (primary) hypertension: Secondary | ICD-10-CM

## 2017-07-24 DIAGNOSIS — N898 Other specified noninflammatory disorders of vagina: Secondary | ICD-10-CM | POA: Insufficient documentation

## 2017-07-24 DIAGNOSIS — N920 Excessive and frequent menstruation with regular cycle: Secondary | ICD-10-CM

## 2017-07-24 DIAGNOSIS — R350 Frequency of micturition: Secondary | ICD-10-CM

## 2017-07-24 LAB — POCT URINALYSIS DIP (MANUAL ENTRY)
BILIRUBIN UA: NEGATIVE
BILIRUBIN UA: NEGATIVE mg/dL
GLUCOSE UA: NEGATIVE mg/dL
Nitrite, UA: NEGATIVE
PH UA: 7 (ref 5.0–8.0)
Protein Ur, POC: NEGATIVE mg/dL
SPEC GRAV UA: 1.015 (ref 1.010–1.025)
Urobilinogen, UA: 0.2 E.U./dL

## 2017-07-24 LAB — POCT WET PREP (WET MOUNT): CLUE CELLS WET PREP WHIFF POC: NEGATIVE

## 2017-07-24 LAB — POCT UA - MICROSCOPIC ONLY: Epithelial cells, urine per micros: >20

## 2017-07-24 MED ORDER — AMLODIPINE BESYLATE 10 MG PO TABS: 10.0000 mg | ORAL_TABLET | Freq: Every day | ORAL | 0 refills | Status: DC

## 2017-07-24 MED ORDER — HYDROCHLOROTHIAZIDE 12.5 MG PO CAPS: 12.5000 mg | ORAL_CAPSULE | Freq: Every day | ORAL | 0 refills | Status: DC

## 2017-07-24 NOTE — Assessment & Plan Note (Signed)
Reports of worsening menorrhagia with clots for the past year. She does have a history of uterine fibroids noted on an old CT abdomen which may be contributing to her symptoms. Due to her age and smoking history, would benefit from further evaluation. Will order pelvic ultrasound. She may need endometrial biopsy regardless of the ultrasound findings. Will also obtain basic labs and TSH. Will also screen for STI with wet prep, GC/Chlamyida, HIV, RPR.

## 2017-07-24 NOTE — Telephone Encounter (Signed)
Attempted to call patient but went to voicemail. Left message to call back   Please attempt to get in touch with patient to inform her that she has trichomonas which is an STD. She needs to make a nurse visit to get treated for this with Flagyl 2g PO x 1. She needs to abstain from intercourse for 7 days after her treatment and also her partner's treatment. Her partner needs to see his PCP to be checked and treated. Please ask her to avoid alcohol use for the next day or two as she can have a bad reaction since she is being treated with Flagyl.

## 2017-07-24 NOTE — Telephone Encounter (Signed)
Pt returning [place] call about her results. Please call her back at [phone] with these results.

## 2017-07-24 NOTE — Assessment & Plan Note (Signed)
Her description of the episode seems to have prodromal symptoms which would most likely mean this is more vasovagal, but she was sitting down when this happened. Prodromal symptoms makes cardiac etiology less likely. However, when we reviewed her prior EKGs there seems to be slurred upstroke to the QRS. EKG today seems to have similar findings. Therefore, will make an urgent referral to cardiology for further evaluation of this. Unlikely seizure or orthostatic symptoms. Possibly due to elevated BP? Will obtain CBC, CMP, TSH to further evaluate.

## 2017-07-24 NOTE — Patient Instructions (Addendum)
1. Passing out Episode:  - we made an urgent referral to the heart doctor see evaluate for possible cardiac cause your episode - we will get blood work today   2. Heavy periods - we are testing for STI - we are getting blood work  - we have ordered an ultrasound of your pelvis  3. Elevated BP:  - continue Norvasc 10mg   - start HCTZ 12.5 mg daily   Please follow up with me in 1-2 weeks

## 2017-07-25 ENCOUNTER — Encounter: Payer: Self-pay | Admitting: Cardiovascular Disease

## 2017-07-25 ENCOUNTER — Telehealth: Payer: Self-pay | Admitting: Internal Medicine

## 2017-07-25 ENCOUNTER — Ambulatory Visit (INDEPENDENT_AMBULATORY_CARE_PROVIDER_SITE_OTHER): Payer: Self-pay | Admitting: Cardiovascular Disease

## 2017-07-25 VITALS — BP 164/72 | HR 94 | Ht 65.0 in | Wt 194.0 lb

## 2017-07-25 DIAGNOSIS — I739 Peripheral vascular disease, unspecified: Secondary | ICD-10-CM

## 2017-07-25 DIAGNOSIS — R55 Syncope and collapse: Secondary | ICD-10-CM

## 2017-07-25 DIAGNOSIS — I779 Disorder of arteries and arterioles, unspecified: Secondary | ICD-10-CM

## 2017-07-25 DIAGNOSIS — I10 Essential (primary) hypertension: Secondary | ICD-10-CM

## 2017-07-25 DIAGNOSIS — D509 Iron deficiency anemia, unspecified: Secondary | ICD-10-CM

## 2017-07-25 DIAGNOSIS — Z13220 Encounter for screening for lipoid disorders: Secondary | ICD-10-CM

## 2017-07-25 LAB — CMP14+EGFR
ALK PHOS: 73 IU/L (ref 39–117)
ALT: 13 IU/L (ref 0–32)
AST: 21 IU/L (ref 0–40)
Albumin/Globulin Ratio: 1.4 (ref 1.2–2.2)
Albumin: 4.3 g/dL (ref 3.5–5.5)
BILIRUBIN TOTAL: 0.2 mg/dL (ref 0.0–1.2)
BUN/Creatinine Ratio: 13 (ref 9–23)
BUN: 10 mg/dL (ref 6–24)
CHLORIDE: 102 mmol/L (ref 96–106)
CO2: 22 mmol/L (ref 20–29)
Calcium: 9.5 mg/dL (ref 8.7–10.2)
Creatinine, Ser: 0.78 mg/dL (ref 0.57–1.00)
GFR calc Af Amer: 104 mL/min/{1.73_m2} (ref >59)
GFR calc non Af Amer: 90 mL/min/{1.73_m2} (ref >59)
GLOBULIN, TOTAL: 3 g/dL (ref 1.5–4.5)
GLUCOSE: 96 mg/dL (ref 65–99)
Potassium: 4.7 mmol/L (ref 3.5–5.2)
SODIUM: 139 mmol/L (ref 134–144)
Total Protein: 7.3 g/dL (ref 6.0–8.5)

## 2017-07-25 LAB — CBC
HEMOGLOBIN: 10.4 g/dL — AB (ref 11.1–15.9)
Hematocrit: 34.1 % (ref 34.0–46.6)
MCH: 23.7 pg — AB (ref 26.6–33.0)
MCHC: 30.5 g/dL — AB (ref 31.5–35.7)
MCV: 78 fL — ABNORMAL LOW (ref 79–97)
Platelets: 518 10*3/uL — ABNORMAL HIGH (ref 150–379)
RBC: 4.39 x10E6/uL (ref 3.77–5.28)
RDW: 17.6 % — AB (ref 12.3–15.4)
WBC: 7.5 10*3/uL (ref 3.4–10.8)

## 2017-07-25 LAB — IRON AND TIBC
IRON SATURATION: 8 % — AB (ref 15–55)
IRON: 33 ug/dL (ref 27–159)
Total Iron Binding Capacity: 429 ug/dL (ref 250–450)
UIBC: 396 ug/dL (ref 131–425)

## 2017-07-25 LAB — FERRITIN: FERRITIN: 12 ng/mL — AB (ref 15–150)

## 2017-07-25 LAB — CERVICOVAGINAL ANCILLARY ONLY
Chlamydia: NEGATIVE
Neisseria Gonorrhea: NEGATIVE

## 2017-07-25 LAB — TSH: TSH: 1.21 u[IU]/mL (ref 0.450–4.500)

## 2017-07-25 LAB — HIV ANTIBODY (ROUTINE TESTING W REFLEX): HIV Screen 4th Generation wRfx: NONREACTIVE

## 2017-07-25 LAB — RPR: RPR: NONREACTIVE

## 2017-07-25 MED ORDER — FERROUS SULFATE 325 (65 FE) MG PO TABS: 325.0000 mg | ORAL_TABLET | Freq: Two times a day (BID) | ORAL | 3 refills | Status: DC

## 2017-07-25 MED ORDER — DOCUSATE SODIUM 100 MG PO CAPS: 100.0000 mg | ORAL_CAPSULE | Freq: Two times a day (BID) | ORAL | 3 refills | Status: DC

## 2017-07-25 MED ORDER — METOPROLOL SUCCINATE ER 25 MG PO TB24: 25.0000 mg | ORAL_TABLET | Freq: Every day | ORAL | 3 refills | Status: DC

## 2017-07-25 NOTE — Assessment & Plan Note (Signed)
BP initially elevated to 150s for SBP. On repeat was 170. Compliant with norvasc. Continue Norvasc 10mg  daily. Add HCTZ 12.5 mg daily.

## 2017-07-25 NOTE — Progress Notes (Signed)
Cardiology Office Note    Date:  07/27/2017   ID:  Susan Sanders, DOB January 18, 1971, MRN [id number]  PCP:  Smiley Houseman, MD  Cardiologist:  Shelva Majestic, MD   New Cardiology consultation referred by Dr. Dennie Fetters   for evaluation of syncope.   History of Present Illness:  Susan Sanders is a 47 y.o. female who was recently evaluated at [facility] following an episode of syncope.  She presents for cardiology consultation.  Susan Sanders works at [employer].  In September 2017 she may have experienced an episode of presyncope/syncope at that time underwent neurologic evaluation.  She was not found to have a seizure.  An echo Doppler study showed an EF of 55-60% with normal diastolic parameters.  Carotid duplex imaging showed mild carotid plaque bilaterally at 1-39%.  He was felt most likely her symptoms were possibly due to hypoglycemia,, or hypertension.  Because of her hypertension history she has been on amlodipine 10 mg daily last week, she was doing a double shift at work where she started at 5 AM to 10 PM.  She did eat but admits that she did not a significant amount that day.  Later that night, she developed lightheadedness, weakness, sensation of feeling hot, and felt dizzy while walking in the hallway.  Reportedly her blood pressure increased to 200/100.  She ultimately passed out.  She was unaware of any arrhythmia.  4 days later she was seen by Dr.Gunadasa.  At that time, her blood pressure was elevated at 170/84.  Thoughts were that most likely this was a vasovagal episode but there was a question of slurred to her QRS complex on ECG.  Laboratory was obtained.  TSH was normal.  She was anemic with a hemoglobin of 10.4 and an iron deficiency with an iron saturation 8% and ferritin of 12.  Apparently today ferrous sulfate was called into initiate which she has not been notified of.  She denies chest pain.  She admits that she is not  getting much rest.  She denies dyspnea.  She denies PND orthopnea.  She is unaware of any sleep disordered breathing.  She was started on HCTZ 12.5 mg yesterday.  She presents for cardiology evaluation   Past Medical History:  Diagnosis Date  . Anxiety   . Asthma   . Cellulitis   . Chest pain   . Chronic back pain greater than 3 months duration   . Complication of anesthesia    difficult waking up "  . Depression   . Depression   . Eczema   . Hypertension   . Scoliosis     Past Surgical History:  Procedure Laterality Date  . TUBAL LIGATION      Current Medications: Outpatient Medications Prior to Visit  Medication Sig Dispense Refill  . acetaminophen (TYLENOL) 500 MG tablet Take 2 tablets (1,000 mg total) by mouth every 6 (six) hours as needed for mild pain or fever. 30 tablet 0  . amLODipine (NORVASC) 10 MG tablet Take 1 tablet (10 mg total) by mouth daily. 30 tablet 0  . docusate sodium (COLACE) 100 MG capsule Take 1 capsule (100 mg total) by mouth 2 (two) times daily. 60 capsule 3  . ferrous sulfate 325 (65 FE) MG tablet Take 1 tablet (325 mg total) by mouth 2 (two) times daily with a meal. 60 tablet 3  . hydrochlorothiazide (MICROZIDE) 12.5 MG capsule Take 1 capsule (12.5 mg total) by  mouth daily. 30 capsule 0  . Multiple Vitamins-Calcium (ONE-A-DAY WOMENS PO) Take 1 tablet by mouth daily.    Marland Kitchen ibuprofen (ADVIL,MOTRIN) 800 MG tablet Take 1 tablet (800 mg total) by mouth every 6 (six) hours as needed for fever or moderate pain. (Patient not taking: Reported on 05/03/2017) 21 tablet 0  . methocarbamol (ROBAXIN-750) 750 MG tablet Take 1 tablet (750 mg total) by mouth 4 (four) times daily. (Patient not taking: Reported on 05/03/2017) 30 tablet 0  . oseltamivir (TAMIFLU) 75 MG capsule Take 1 capsule (75 mg total) by mouth 2 (two) times daily. (Patient not taking: Reported on 12/23/2016) 10 capsule 0  . predniSONE (DELTASONE) 20 MG tablet Take 2 tablets daily with breakfast. 10  tablet 0   No facility-administered medications prior to visit.      Allergies:   Latex; Amoxicillin; Naproxen; and Tramadol   Social History   Socioeconomic History  . Marital status: Divorced    Spouse name: None  . Number of children: None  . Years of education: None  . Highest education level: None  Social Needs  . Financial resource strain: None  . Food insecurity - worry: None  . Food insecurity - inability: None  . Transportation needs - medical: None  . Transportation needs - non-medical: None  Occupational History  . None  Tobacco Use  . Smoking status: Former Smoker    Years: 14.00    Last attempt to quit: 09/20/2010    Years since quitting: 6.8  . Smokeless tobacco: Never Used  Substance and Sexual Activity  . Alcohol use: Yes    Comment: occasional   . Drug use: No  . Sexual activity: None  Other Topics Concern  . None  Social History Narrative  . None    Additional social history is notable in that she is divorced for 22 years.  She has 2 children and 5 grandchildren.  She used to smoke marijuana for over 20 years but discontinued this.  She drinks several beers per week.  Family History:  The patient's family history includes Hypertension in her mother.   ROS General: Negative; No fevers, chills, or night sweats;  HEENT: Negative; No changes in vision or hearing, sinus congestion, difficulty swallowing Pulmonary: Negative; No cough, wheezing, shortness of breath, hemoptysis Cardiovascular:  See HPI GI: Negative; No nausea, vomiting, diarrhea, or abdominal pain GU: Negative; No dysuria, hematuria, or difficulty voiding Musculoskeletal: Negative; no myalgias, joint pain, or weakness Hematologic/Oncology: Negative; no easy bruising, bleeding Endocrine: Negative; no heat/cold intolerance; no diabetes Neuro: Negative; no changes in balance, headaches Skin: Negative; No rashes or skin lesions Psychiatric: Negative; No behavioral problems,  depression Sleep: Negative; No snoring, daytime sleepiness, hypersomnolence, bruxism, restless legs, hypnogognic hallucinations, no cataplexy Other comprehensive 14 point system review is negative.   PHYSICAL EXAM:   VS:  BP (!) 164/72   Pulse 94   Ht '5\' 5"'  (1.651 m)   Wt 194 lb (88 kg)   LMP 07/20/2017   BMI 32.28 kg/m     Repeat blood pressure by me was 162/78 supine and 148/78 standing  Wt Readings from Last 3 Encounters:  07/25/17 194 lb (88 kg)  07/24/17 192 lb (87.1 kg)  05/03/17 170 lb (77.1 kg)    General: Alert, oriented, no distress.  Skin: normal turgor, no rashes, warm and dry HEENT: Normocephalic, atraumatic. Pupils equal round and reactive to light; sclera anicteric; extraocular muscles intact; Fundi mild arteriolar narrowing.  No hemorrhages.  No papilledema. Nose  without nasal septal hypertrophy Mouth/Parynx benign; Mallinpatti scale 2; tongue piercing Neck: No JVD, no carotid bruits; normal carotid upstroke Lungs: clear to ausculatation and percussion; no wheezing or rales Chest wall: without tenderness to palpitation Heart: PMI not displaced, RRR, s1 s2 normal, soft intermittent click; 1/6 systolic murmur, no diastolic murmur, no rubs, gallops, thrills, or heaves Abdomen: soft, nontender; no hepatosplenomehaly, BS+; abdominal aorta nontender and not dilated by palpation. Back: no CVA tenderness Pulses 2+ Musculoskeletal: full range of motion, normal strength, no joint deformities Extremities: no clubbing cyanosis or edema, Homan's sign negative  Neurologic: grossly nonfocal; Cranial nerves grossly wnl Psychologic: Normal mood and affect   Studies/Labs Reviewed:   EKG:  EKG is ordered today.  ECG (independently read by me): normal sinus rhythm at 94 bpm.  PR interval 128 ms.  Mild upsloping of the R wave without delta wave  I personally reviewed the ECG from 07/24/2017, which shows normal sinus rhythm at 71 with a PR interval 122 ms.  Mild upslopingof PR  segment without definitive delta wave.  QTc interval 439 ms.  No significant ST-T changes  Recent Labs: BMP Latest Ref Rng & Units 07/24/2017 12/23/2016 11/22/2016  Glucose 65 - 99 mg/dL 96 76 96  BUN 6 - 24 mg/dL '10 11 13  ' Creatinine 0.57 - 1.00 mg/dL 0.78 0.80 0.91  BUN/Creat Ratio 9 - 23 13 - -  Sodium 134 - 144 mmol/L 139 138 136  Potassium 3.5 - 5.2 mmol/L 4.7 4.3 3.6  Chloride 96 - 106 mmol/L 102 110 105  CO2 20 - 29 mmol/L 22 18(L) 21(L)  Calcium 8.7 - 10.2 mg/dL 9.5 8.9 9.0     Hepatic Function Latest Ref Rng & Units 07/24/2017 12/23/2016 11/22/2016  Total Protein 6.0 - 8.5 g/dL 7.3 7.4 7.2  Albumin 3.5 - 5.5 g/dL 4.3 4.0 3.9  AST 0 - 40 IU/L '21 21 23  ' ALT 0 - 32 IU/L '13 15 14  ' Alk Phosphatase 39 - 117 IU/L 73 53 54  Total Bilirubin 0.0 - 1.2 mg/dL 0.2 0.6 0.6    CBC Latest Ref Rng & Units 07/24/2017 12/23/2016 11/22/2016  WBC 3.4 - 10.8 x10E3/uL 7.5 7.7 8.4  Hemoglobin 11.1 - 15.9 g/dL 10.4(L) 12.1 10.4(L)  Hematocrit 34.0 - 46.6 % 34.1 36.7 32.1(L)  Platelets 150 - 379 x10E3/uL 518(H) 401(H) 389   Lab Results  Component Value Date   MCV 78 (L) 07/24/2017   MCV 77.1 (L) 12/23/2016   MCV 76.4 (L) 11/22/2016   Lab Results  Component Value Date   TSH 1.210 07/24/2017   Lab Results  Component Value Date   HGBA1C 5.0 08/21/2011     BNP No results found for: BNP  ProBNP No results found for: PROBNP   Lipid Panel     Component Value Date/Time   CHOL 191 12/10/2013 0932   TRIG 73 12/10/2013 0932   HDL 71 12/10/2013 0932   CHOLHDL 2.7 12/10/2013 0932   VLDL 15 12/10/2013 0932   LDLCALC 105 (H) 12/10/2013 0932     RADIOLOGY: No results found.   Additional studies/ records that were reviewed today include:  I have reviewed records from 2017, including echo and carotid studies.  Recent practice evaluation and laboratory.    ASSESSMENT:    1. Syncope, unspecified syncope type   2. Hypertension, unspecified type   3. Lipid screening   4. Iron  deficiency anemia, unspecified iron deficiency anemia type   5. Mild carotid artery disease ([place])  PLAN:  Susan Sanders is a 47 year old American female who is previously been documented to have normal LV function and mild carotid disease in 2017.  She has a long history of hypertension and has been on amlodipine 10 mg.  She has experienced 2 episodes of syncope.  In 2017 it was not felt to be of neurologic etiology.  Her most recent episode occurred normal wall 1 day where she was performing a double shift.  She had not eaten well.  Her blood pressure was significantly elevated.  She denied antecedent arrhythmia.  However, prior to the event she became very hot, stood in front of a fan for 30 minutes, and then noticed dizziness with walking in the hallway.  On exam today, she is hypertensive.  She is on amlodipine 10 mg a day was started on HCTZ.  Her resting pulse is 94.  I have recommended initiating Toprol-XL 25 mg daily.  Her ECG shows very mild upsloping of her PR segment without definitive delta wave.  PR interval is normal and not less than 120 ms.  She does have significant iron deficiency with laboratory demonstrating iron saturation at 8%.  I was about to call in a prescription for Niferex but realized apparently a prescription for ferrous sulfate just been called in which she has not yet received.  I am repeating a 2-year follow-up echo Doppler study.  On exam she does have a click and short systolic murmur.  Carotid study in 2017 showed mild plaque. I will check additional fasting laboratory with a lipid panel and will also check a magnesium level at that time.  I will see her back in the office for follow-up evaluation.   Medication Adjustments/Labs and Tests Ordered: Current medicines are reviewed at length with the patient today.  Concerns regarding medicines are outlined above.  Medication changes, Labs and Tests ordered today are listed in the Patient Instructions below. Patient  Instructions  Medication Instructions:  START metoprolol succinate (Toprol XL) 25 mg daily  Labwork: Please return for FASTING labs (Lipid, Lipid)  Our in office lab hours are Monday-Friday 8:00-4:00, closed for lunch 12:45-1:45 pm.  No appointment needed.  Testing/Procedures: Your physician has requested that you have an echocardiogram. Echocardiography is a painless test that uses sound waves to create images of your heart. It provides your doctor with information about the size and shape of your heart and how well your heart's chambers and valves are working. This procedure takes approximately one hour. There are no restrictions for this procedure.  This will be done at our [facility] location:  [address]  Follow-Up: 2 months with Dr. Claiborne Billings  Any Other Special Instructions Will Be Listed Below (If Applicable).     If you need a refill on your cardiac medications before your next appointment, please call your pharmacy.      Signed, Shelva Majestic, MD  07/27/2017 10:00 AM    [facility] [address] [place], [place], [place]  [address] Phone: [phone]

## 2017-07-25 NOTE — Telephone Encounter (Signed)
Provider spoke with patient.  Susan Sanders,CMA  

## 2017-07-25 NOTE — Patient Instructions (Signed)
Medication Instructions:  START metoprolol succinate (Toprol XL) 25 mg daily  Labwork: Please return for FASTING labs (Lipid, Lipid)  Our in office lab hours are Monday-Friday 8:00-4:00, closed for lunch 12:45-1:45 pm.  No appointment needed.  Testing/Procedures: Your physician has requested that you have an echocardiogram. Echocardiography is a painless test that uses sound waves to create images of your heart. It provides your doctor with information about the size and shape of your heart and how well your heart's chambers and valves are working. This procedure takes approximately one hour. There are no restrictions for this procedure.  This will be done at our [facility] location:  [address]  Follow-Up: 2 months with Dr. Claiborne Billings  Any Other Special Instructions Will Be Listed Below (If Applicable).     If you need a refill on your cardiac medications before your next appointment, please call your pharmacy.

## 2017-07-25 NOTE — Telephone Encounter (Signed)
Called patient to inform of positive trichomonas. Asked her to make a nurse visit to get treated for this with Flagyl 2g PO x 1. She needs to abstain from intercourse for 7 days after her treatment and also her partner's treatment. Her partner needs to see his PCP to be checked and treated. Please ask her to avoid alcohol use for the next day or two as she can have a bad reaction since she is being treated with Flagyl. she knows that she will need to be retested 3 months after treatment.   She also has iron deficiency anemia. We will start her on Ferrous sulfate. Colace 100mg  BID for constipation that she may have issues with.

## 2017-07-25 NOTE — Telephone Encounter (Signed)
Noted. Danley Danker, RN [facility] RN)

## 2017-07-25 NOTE — Telephone Encounter (Signed)
Will forward to RN team to let them know about patient. Ramere Downs,CMA

## 2017-07-27 ENCOUNTER — Encounter: Payer: Self-pay | Admitting: Cardiovascular Disease

## 2017-07-29 ENCOUNTER — Encounter: Payer: Self-pay | Admitting: Internal Medicine

## 2017-07-31 ENCOUNTER — Ambulatory Visit (HOSPITAL_COMMUNITY)
Admission: RE | Admit: 2017-07-31 | Discharge: 2017-07-31 | Disposition: A | Discharge status: Home / Self Care | Payer: Medicaid Other | Source: Ambulatory Visit | Attending: Family Medicine | Admitting: Family Medicine

## 2017-07-31 DIAGNOSIS — D259 Leiomyoma of uterus, unspecified: Secondary | ICD-10-CM | POA: Insufficient documentation

## 2017-07-31 DIAGNOSIS — N920 Excessive and frequent menstruation with regular cycle: Secondary | ICD-10-CM

## 2017-08-02 ENCOUNTER — Telehealth: Payer: Self-pay | Admitting: Internal Medicine

## 2017-08-02 DIAGNOSIS — N920 Excessive and frequent menstruation with regular cycle: Secondary | ICD-10-CM

## 2017-08-02 DIAGNOSIS — N946 Dysmenorrhea, unspecified: Secondary | ICD-10-CM

## 2017-08-02 NOTE — Telephone Encounter (Signed)
Called patient regarding ultrasound of her pelvis. There are multiple fibroids which is likely what is contributing to her dysmenorrhea and menorrhagia. I also mentioned about "There is suggestion of endometrial thickening toward the uterine fundus". She may benefit from hysteroscopy. She is agreeable to see GYN. She is interested in hysterectomy for her dysmenorrhea/menorrhagia likely due to fibroids.

## 2017-08-06 ENCOUNTER — Other Ambulatory Visit (HOSPITAL_COMMUNITY): Payer: Self-pay

## 2017-08-06 ENCOUNTER — Other Ambulatory Visit: Payer: Self-pay | Admitting: Internal Medicine

## 2017-08-06 ENCOUNTER — Encounter: Payer: Self-pay | Admitting: Internal Medicine

## 2017-08-06 ENCOUNTER — Other Ambulatory Visit: Payer: Self-pay

## 2017-08-06 ENCOUNTER — Ambulatory Visit (INDEPENDENT_AMBULATORY_CARE_PROVIDER_SITE_OTHER): Payer: Self-pay | Admitting: Internal Medicine

## 2017-08-06 VITALS — BP 132/80 | HR 73 | Temp 98.5°F | Ht 65.0 in | Wt 197.0 lb

## 2017-08-06 DIAGNOSIS — M545 Low back pain, unspecified: Secondary | ICD-10-CM

## 2017-08-06 DIAGNOSIS — A599 Trichomoniasis, unspecified: Secondary | ICD-10-CM

## 2017-08-06 DIAGNOSIS — G8929 Other chronic pain: Secondary | ICD-10-CM

## 2017-08-06 MED ORDER — METRONIDAZOLE 500 MG PO TABS
1000.0000 mg | ORAL_TABLET | Freq: Once | ORAL | Status: AC
  Administered 2017-08-06: 1000 mg via ORAL

## 2017-08-06 MED ORDER — IBUPROFEN 600 MG PO TABS: 600.0000 mg | ORAL_TABLET | Freq: Three times a day (TID) | ORAL | 0 refills | Status: DC | PRN

## 2017-08-06 NOTE — Patient Instructions (Signed)
Let's take Ibuprofen 600mg  three times a day for 3-4 days then as needed. Take with food.   Back Exercises The following exercises strengthen the muscles that help to support the back. They also help to keep the lower back flexible. Doing these exercises can help to prevent back pain or lessen existing pain. If you have back pain or discomfort, try doing these exercises 2-3 times each day or as told by your health care provider. When the pain goes away, do them once each day, but increase the number of times that you repeat the steps for each exercise (do more repetitions). If you do not have back pain or discomfort, do these exercises once each day or as told by your health care provider. Exercises Single Knee to Chest  Repeat these steps 3-5 times for each leg: 1. Lie on your back on a firm bed or the floor with your legs extended. 2. Bring one knee to your chest. Your other leg should stay extended and in contact with the floor. 3. Hold your knee in place by grabbing your knee or thigh. 4. Pull on your knee until you feel a gentle stretch in your lower back. 5. Hold the stretch for 10-30 seconds. 6. Slowly release and straighten your leg.  Pelvic Tilt  Repeat these steps 5-10 times: 1. Lie on your back on a firm bed or the floor with your legs extended. 2. Bend your knees so they are pointing toward the ceiling and your feet are flat on the floor. 3. Tighten your lower abdominal muscles to press your lower back against the floor. This motion will tilt your pelvis so your tailbone points up toward the ceiling instead of pointing to your feet or the floor. 4. With gentle tension and even breathing, hold this position for 5-10 seconds.  Cat-Cow  Repeat these steps until your lower back becomes more flexible: 1. Get into a hands-and-knees position on a firm surface. Keep your hands under your shoulders, and keep your knees under your hips. You may place padding under your knees for  comfort. 2. Let your head hang down, and point your tailbone toward the floor so your lower back becomes rounded like the back of a cat. 3. Hold this position for 5 seconds. 4. Slowly lift your head and point your tailbone up toward the ceiling so your back forms a sagging arch like the back of a cow. 5. Hold this position for 5 seconds.  Press-Ups  Repeat these steps 5-10 times: 1. Lie on your abdomen (face-down) on the floor. 2. Place your palms near your head, about shoulder-width apart. 3. While you keep your back as relaxed as possible and keep your hips on the floor, slowly straighten your arms to raise the top half of your body and lift your shoulders. Do not use your back muscles to raise your upper torso. You may adjust the placement of your hands to make yourself more comfortable. 4. Hold this position for 5 seconds while you keep your back relaxed. 5. Slowly return to lying flat on the floor.  Bridges  Repeat these steps 10 times: 1. Lie on your back on a firm surface. 2. Bend your knees so they are pointing toward the ceiling and your feet are flat on the floor. 3. Tighten your buttocks muscles and lift your buttocks off of the floor until your waist is at almost the same height as your knees. You should feel the muscles working in your buttocks and  the back of your thighs. If you do not feel these muscles, slide your feet 1-2 inches farther away from your buttocks. 4. Hold this position for 3-5 seconds. 5. Slowly lower your hips to the starting position, and allow your buttocks muscles to relax completely.  If this exercise is too easy, try doing it with your arms crossed over your chest. Abdominal Crunches  Repeat these steps 5-10 times: 1. Lie on your back on a firm bed or the floor with your legs extended. 2. Bend your knees so they are pointing toward the ceiling and your feet are flat on the floor. 3. Cross your arms over your chest. 4. Tip your chin slightly toward  your chest without bending your neck. 5. Tighten your abdominal muscles and slowly raise your trunk (torso) high enough to lift your shoulder blades a tiny bit off of the floor. Avoid raising your torso higher than that, because it can put too much stress on your low back and it does not help to strengthen your abdominal muscles. 6. Slowly return to your starting position.  Back Lifts Repeat these steps 5-10 times: 1. Lie on your abdomen (face-down) with your arms at your sides, and rest your forehead on the floor. 2. Tighten the muscles in your legs and your buttocks. 3. Slowly lift your chest off of the floor while you keep your hips pressed to the floor. Keep the back of your head in line with the curve in your back. Your eyes should be looking at the floor. 4. Hold this position for 3-5 seconds. 5. Slowly return to your starting position.  Contact a health care provider if:  Your back pain or discomfort gets much worse when you do an exercise.  Your back pain or discomfort does not lessen within 2 hours after you exercise. If you have any of these problems, stop doing these exercises right away. Do not do them again unless your health care provider says that you can. Get help right away if:  You develop sudden, severe back pain. If this happens, stop doing the exercises right away. Do not do them again unless your health care provider says that you can. This information is not intended to replace advice given to you by your health care provider. Make sure you discuss any questions you have with your health care provider. Document Released: 06/07/2004 Document Revised: 09/07/2015 Document Reviewed: 06/24/2014 Elsevier Interactive Patient Education  2017 [employer].

## 2017-08-06 NOTE — Progress Notes (Signed)
   [facility] Phone: [phone]   Date of Visit: 08/06/2017   HPI:  Trichomonas: - here for treatment   Back Pain:  - reports of history of low back pain which has worsened in the past few days. No increase in activity recently - no radiation of pain - she does not exercise regularly  - no lower extremity weakness or numbness - no saddle anesthesia - no urinary retention or incontinence - no bowel incontinence - has tried Tylenol which has not helped - difficult to go to sleep due to the pain - no dysuria or urinary frequency - has lower abdominal cramping as well which she thinks is due to her period that will start soon.  - no nausea or vomiting  - MRI in 05/2014 with mild DDD of the lumbar spine without central canal stenosis. A small extra foraminal protrusion on the left at L3-4 contacts the left L3 root without compressing it. Scattered mild facet arthropathy. Convex left scoliosis   ROS: See HPI.  [place]:  HTN Migraine Allergic Rhinitis GERD Anxiety/Depression   PHYSICAL EXAM: BP 132/80   Pulse 73   Temp 98.5 F (36.9 C) (Oral)   Ht 5\' 5"  (1.651 m)   Wt 197 lb (89.4 kg)   LMP 07/20/2017   SpO2 99%   BMI 32.78 kg/m  GEN: NAD CV: RRR, no murmurs, rubs, or gallops PULM: CTAB, normal effort ABD: Soft, nontender, nondistended, NABS, no organomegaly MSK: no midline tenderness to palpation. Tenderness to palpation of the left lumbar paraspinal muscles more than the right.  SKIN: No rash or cyanosis; warm and well-perfused EXTR: No lower extremity edema or calf tenderness PSYCH: Mood and affect euthymic, normal rate and volume of speech NEURO: Awake, alert, no focal deficits grossly, normal speech  ASSESSMENT/PLAN:  Health maintenance:  -declines flu  Back pain Most consistent with MSK. I do not think imaging is currently indicated. No red flags. Will do trial of NSAID. Ibuprofen 600mg  TID for 3-4 days then as needed. Take with  food. Back exercises given.   Triamcinolone:  - treated in clinic  Smiley Houseman, MD PGY [place]

## 2017-08-06 NOTE — Assessment & Plan Note (Signed)
Most consistent with MSK. I do not think imaging is currently indicated. No red flags. Will do trial of NSAID. Ibuprofen 600mg  TID for 3-4 days then as needed. Take with food. Back exercises given.

## 2017-08-09 ENCOUNTER — Ambulatory Visit (HOSPITAL_COMMUNITY): Payer: Self-pay | Attending: Cardiovascular Disease

## 2017-08-09 ENCOUNTER — Other Ambulatory Visit: Payer: Self-pay

## 2017-08-09 DIAGNOSIS — F419 Anxiety disorder, unspecified: Secondary | ICD-10-CM | POA: Insufficient documentation

## 2017-08-09 DIAGNOSIS — J45909 Unspecified asthma, uncomplicated: Secondary | ICD-10-CM | POA: Insufficient documentation

## 2017-08-09 DIAGNOSIS — I10 Essential (primary) hypertension: Secondary | ICD-10-CM

## 2017-08-09 DIAGNOSIS — R079 Chest pain, unspecified: Secondary | ICD-10-CM | POA: Insufficient documentation

## 2017-08-09 DIAGNOSIS — R55 Syncope and collapse: Secondary | ICD-10-CM

## 2017-08-09 DIAGNOSIS — M419 Scoliosis, unspecified: Secondary | ICD-10-CM | POA: Insufficient documentation

## 2017-08-26 ENCOUNTER — Encounter: Payer: Self-pay | Admitting: Obstetrics and Gynecology

## 2017-10-16 ENCOUNTER — Encounter: Payer: Medicaid Other | Admitting: Obstetrics and Gynecology

## 2017-11-06 ENCOUNTER — Ambulatory Visit: Payer: Self-pay | Admitting: Cardiovascular Disease

## 2017-11-07 ENCOUNTER — Encounter: Payer: Self-pay | Admitting: *Deleted

## 2017-12-22 ENCOUNTER — Encounter (HOSPITAL_COMMUNITY): Payer: Self-pay

## 2017-12-22 ENCOUNTER — Emergency Department (HOSPITAL_COMMUNITY): Payer: Medicaid Other

## 2017-12-22 ENCOUNTER — Other Ambulatory Visit: Payer: Self-pay

## 2017-12-22 ENCOUNTER — Emergency Department (HOSPITAL_COMMUNITY)
Admission: EM | Admit: 2017-12-22 | Discharge: 2017-12-22 | Disposition: A | Discharge status: Home / Self Care | Payer: Medicaid Other | Attending: Emergency Medicine | Admitting: Emergency Medicine

## 2017-12-22 DIAGNOSIS — I10 Essential (primary) hypertension: Secondary | ICD-10-CM | POA: Insufficient documentation

## 2017-12-22 DIAGNOSIS — Z91040 Latex allergy status: Secondary | ICD-10-CM | POA: Insufficient documentation

## 2017-12-22 DIAGNOSIS — Z79899 Other long term (current) drug therapy: Secondary | ICD-10-CM | POA: Insufficient documentation

## 2017-12-22 DIAGNOSIS — J45909 Unspecified asthma, uncomplicated: Secondary | ICD-10-CM | POA: Insufficient documentation

## 2017-12-22 DIAGNOSIS — R05 Cough: Secondary | ICD-10-CM | POA: Insufficient documentation

## 2017-12-22 DIAGNOSIS — R197 Diarrhea, unspecified: Secondary | ICD-10-CM | POA: Insufficient documentation

## 2017-12-22 DIAGNOSIS — Z87891 Personal history of nicotine dependence: Secondary | ICD-10-CM | POA: Insufficient documentation

## 2017-12-22 DIAGNOSIS — R059 Cough, unspecified: Secondary | ICD-10-CM

## 2017-12-22 DIAGNOSIS — R112 Nausea with vomiting, unspecified: Secondary | ICD-10-CM

## 2017-12-22 LAB — I-STAT BETA HCG BLOOD, ED (MC, WL, AP ONLY)

## 2017-12-22 LAB — COMPREHENSIVE METABOLIC PANEL
ALK PHOS: 68 U/L (ref 38–126)
ALT: 15 U/L (ref 0–44)
AST: 20 U/L (ref 15–41)
Albumin: 3.7 g/dL (ref 3.5–5.0)
Anion gap: 13 (ref 5–15)
BUN: 7 mg/dL (ref 6–20)
CALCIUM: 8.9 mg/dL (ref 8.9–10.3)
CO2: 26 mmol/L (ref 22–32)
Chloride: 102 mmol/L (ref 98–111)
Creatinine, Ser: 0.84 mg/dL (ref 0.44–1.00)
Glucose, Bld: 105 mg/dL — ABNORMAL HIGH (ref 70–99)
Potassium: 3.2 mmol/L — ABNORMAL LOW (ref 3.5–5.1)
Sodium: 141 mmol/L (ref 135–145)
Total Bilirubin: 0.7 mg/dL (ref 0.3–1.2)
Total Protein: 7.5 g/dL (ref 6.5–8.1)

## 2017-12-22 LAB — CBC
HCT: 34.3 % — ABNORMAL LOW (ref 36.0–46.0)
Hemoglobin: 11 g/dL — ABNORMAL LOW (ref 12.0–15.0)
MCH: 23.7 pg — AB (ref 26.0–34.0)
MCHC: 32.1 g/dL (ref 30.0–36.0)
MCV: 73.9 fL — ABNORMAL LOW (ref 78.0–100.0)
PLATELETS: 502 10*3/uL — AB (ref 150–400)
RBC: 4.64 MIL/uL (ref 3.87–5.11)
RDW: 17.9 % — ABNORMAL HIGH (ref 11.5–15.5)
WBC: 8.3 10*3/uL (ref 4.0–10.5)

## 2017-12-22 LAB — LIPASE, BLOOD: Lipase: 25 U/L (ref 11–51)

## 2017-12-22 MED ORDER — ONDANSETRON 8 MG PO TBDP: 8.0000 mg | ORAL_TABLET | Freq: Three times a day (TID) | ORAL | 0 refills | Status: DC | PRN

## 2017-12-22 MED ORDER — SODIUM CHLORIDE 0.9 % IV BOLUS
1000.0000 mL | Freq: Once | INTRAVENOUS | Status: AC
  Administered 2017-12-22: 1000 mL via INTRAVENOUS

## 2017-12-22 MED ORDER — KETOROLAC TROMETHAMINE 30 MG/ML IJ SOLN
30.0000 mg | Freq: Once | INTRAMUSCULAR | Status: AC
  Administered 2017-12-22: 30 mg via INTRAVENOUS
  Filled 2017-12-22: qty 1

## 2017-12-22 MED ORDER — BENZONATATE 100 MG PO CAPS: 100.0000 mg | ORAL_CAPSULE | Freq: Three times a day (TID) | ORAL | 0 refills | Status: DC

## 2017-12-22 MED ORDER — ONDANSETRON HCL 4 MG/2ML IJ SOLN
4.0000 mg | Freq: Once | INTRAMUSCULAR | Status: AC | PRN
  Administered 2017-12-22: 4 mg via INTRAVENOUS
  Filled 2017-12-22: qty 2

## 2017-12-22 NOTE — ED Triage Notes (Signed)
PT C/O N/V/D AND BODY ACHES SINCE Friday. PT STS HER DAUGHTER HAD THE SAME SYMPTOMS THE DAY BEFORE HERE.

## 2017-12-22 NOTE — ED Provider Notes (Signed)
[facility] DEPT Provider Note   CSN: [id number] Arrival date & time: 12/22/17  1937     History   Chief Complaint Chief Complaint  Patient presents with  . Emesis    HPI Susan Sanders is a 47 y.o. female.  HPI ALIENE Sanders is a 47 y.o. female with history of asthma, chronic back pain, depression, hypertension, presents to emergency department complaining of cough, nausea, vomiting, diarrhea.  States symptoms started yesterday.  States had too numerous to count episodes of diarrhea and emesis.  She is unable to keep anything down.  She states her daughter has been sick with similar symptoms and was prescribed Phenergan.  She tried taking Phenergan but was unable to keep it down either.  She reports generalized weakness, body aches, dizziness, lightheadedness.  She states she had a fever of 101 yesterday for which she took Tylenol, she is not sure if she kept it down.  She also reports seeing some specks of blood in her sputum, but not each time she coughs up phlegm.  Most of the time phlegm is clear.  She denies any chest pain.  She denies any urinary symptoms.  No vaginal discharge or bleeding.  No blood in her stool.  Past Medical History:  Diagnosis Date  . Anxiety   . Asthma   . Cellulitis   . Chest pain   . Chronic back pain greater than 3 months duration   . Complication of anesthesia    difficult waking up "  . Depression   . Depression   . Eczema   . Hypertension   . Scoliosis     Patient Active Problem List   Diagnosis Date Noted  . Overweight 11/02/2016  . Muscle weakness (generalized)   . Syncope and collapse 02/04/2016  . Essential hypertension 07/14/2015  . Cervical spondylolysis 06/18/2014  . Healthcare maintenance 12/10/2013  . Former smoker 12/10/2013  . GERD (gastroesophageal reflux disease) 12/10/2013  . Fatigue 09/09/2013  . Pain, joint, hand, right 05/20/2013  . Leg pain, bilateral 04/22/2012  . Dyshidrotic eczema  12/26/2011  . Back pain 12/05/2010  . ALLERGIC RHINITIS 08/20/2008  . Menorrhagia with regular cycle 03/20/2007  . MIGRAINE HEADACHE 02/20/2007  . Anxiety and depression 01/23/2007    Past Surgical History:  Procedure Laterality Date  . TUBAL LIGATION       OB History    Gravida  7   Para  6   Term  4   Preterm  2   AB  1   Living  6     SAB      TAB      Ectopic  1   Multiple      Live Births               Home Medications    Prior to Admission medications   Medication Sig Start Date End Date Taking? Authorizing Provider  acetaminophen (TYLENOL) 500 MG tablet Take 2 tablets (1,000 mg total) by mouth every 6 (six) hours as needed for mild pain or fever. 06/06/16   Leo Grosser, MD  amLODipine (NORVASC) 10 MG tablet Take 1 tablet (10 mg total) by mouth daily. 07/24/17 08/23/17  Smiley Houseman, MD  docusate sodium (COLACE) 100 MG capsule Take 1 capsule (100 mg total) by mouth 2 (two) times daily. 07/25/17   Smiley Houseman, MD  ferrous sulfate 325 (65 FE) MG tablet Take 1 tablet (325 mg total) by mouth 2 (  two) times daily with a meal. 07/25/17   Smiley Houseman, MD  hydrochlorothiazide (MICROZIDE) 12.5 MG capsule Take 1 capsule (12.5 mg total) by mouth daily. 07/24/17   Smiley Houseman, MD  ibuprofen (ADVIL,MOTRIN) 600 MG tablet Take 1 tablet (600 mg total) by mouth every 8 (eight) hours as needed. 08/06/17   Smiley Houseman, MD  metoprolol succinate (TOPROL XL) 25 MG 24 hr tablet Take 1 tablet (25 mg total) by mouth daily. 07/25/17   Troy Sine, MD  Multiple Vitamins-Calcium (ONE-A-DAY WOMENS PO) Take 1 tablet by mouth daily.    [provider]    Family History Family History  Problem Relation Age of Onset  . Hypertension Mother     Social History Social History   Tobacco Use  . Smoking status: Former Smoker    Years: 14.00    Last attempt to quit: 09/20/2010    Years since quitting: 7.2  . Smokeless tobacco: Never  Used  Substance Use Topics  . Alcohol use: Yes    Comment: occasional   . Drug use: No     Allergies   Latex; Amoxicillin; Naproxen; and Tramadol   Review of Systems Review of Systems  Constitutional: Positive for chills, fatigue and fever.  HENT: Negative for congestion.   Respiratory: Positive for cough. Negative for chest tightness and shortness of breath.   Cardiovascular: Negative for chest pain, palpitations and leg swelling.  Gastrointestinal: Positive for abdominal pain, diarrhea, nausea and vomiting.  Genitourinary: Positive for vaginal bleeding. Negative for dysuria, flank pain, pelvic pain, vaginal discharge and vaginal pain.  Musculoskeletal: Positive for myalgias. Negative for neck pain and neck stiffness.  Skin: Negative for rash.  Neurological: Positive for dizziness, weakness, light-headedness and headaches.  All other systems reviewed and are negative.    Physical Exam Updated Vital Signs BP (!) 165/92   Pulse 91   Temp 98.5 F (36.9 C) (Oral)   Resp 18   Ht 5\' 5"  (1.651 m)   Wt 83.9 kg   LMP 12/22/2017   SpO2 100%   BMI 30.79 kg/m   Physical Exam  Constitutional: She appears well-developed and well-nourished. No distress.  HENT:  Head: Normocephalic.  Eyes: Conjunctivae are normal.  Neck: Neck supple.  Cardiovascular: Normal rate, regular rhythm and normal heart sounds.  Pulmonary/Chest: Effort normal and breath sounds normal. No respiratory distress. She has no wheezes. She has no rales.  Abdominal: Soft. Bowel sounds are normal. She exhibits no distension. There is no tenderness. There is no rebound.  Musculoskeletal: She exhibits no edema.  Neurological: She is alert.  Skin: Skin is warm and dry.  Psychiatric: She has a normal mood and affect. Her behavior is normal.  Nursing note and vitals reviewed.    ED Treatments / Results  Labs (all labs ordered are listed, but only abnormal results are displayed) Labs Reviewed  COMPREHENSIVE  METABOLIC PANEL - Abnormal; Notable for the following components:      Result Value   Potassium 3.2 (*)    Glucose, Bld 105 (*)    All other components within normal limits  CBC - Abnormal; Notable for the following components:   Hemoglobin 11.0 (*)    HCT 34.3 (*)    MCV 73.9 (*)    MCH 23.7 (*)    RDW 17.9 (*)    Platelets 502 (*)    All other components within normal limits  LIPASE, BLOOD  URINALYSIS, ROUTINE W REFLEX MICROSCOPIC  I-STAT BETA HCG  BLOOD, ED ([facility], [facility], [facility] ONLY)    EKG None  Radiology Dg Chest 2 View  Result Date: 12/22/2017 CLINICAL DATA:  Cough, fever EXAM: CHEST - 2 VIEW COMPARISON:  11/22/2016 FINDINGS: The heart size and mediastinal contours are within normal limits. Both lungs are clear. The visualized skeletal structures are unremarkable. IMPRESSION: No active cardiopulmonary disease. Electronically Signed   By: Kathreen Devoid   On: 12/22/2017 11:40    Procedures Procedures (including critical care time)  Medications Ordered in ED Medications  sodium chloride 0.9 % bolus 1,000 mL (1,000 mLs Intravenous New Bag/Given 12/22/17 1031)  ondansetron (ZOFRAN) injection 4 mg (4 mg Intravenous Given 12/22/17 1032)     Initial Impression / Assessment and Plan / ED Course  I have reviewed the triage vital signs and the nursing notes.  Pertinent labs & imaging results that were available during my care of the patient were reviewed by me and considered in my medical decision making (see chart for details).     Pt in ED with n/v/d, cough, headache, back pain. Will check labs. zofran for n/v. toradol for pain.   Patient feels better with medications.  Labs unremarkable other than mild hypokalemia presumably from diarrhea.  We discussed increasing potassium in her diet.  She was given a p.o. trial and if able to drink without difficulty.  She drank a whole cup of ginger ale and a whole cup of water.  She apparently went to the bathroom early and urinated but did not  provide [place] with a specimen.  Did try to walk her to the bathroom that she if she can give [place] another sample but she was unable to do so.  She has not been coughing while in the department.  She has not had any diarrhea or vomiting.  She is stable for discharge home with close outpatient follow-up.   Vitals:   12/22/17 0930 12/22/17 0941 12/22/17 1428  BP: (!) 198/98 (!) 165/92 (!) 176/78  Pulse: (!) 102 91 71  Resp: 18  18  Temp: 98.5 F (36.9 C)    TempSrc: Oral    SpO2: 100%  100%  Weight: 83.9 kg    Height: 5\' 5"  (1.651 m)       Final Clinical Impressions(s) / ED Diagnoses   Final diagnoses:  Nausea vomiting and diarrhea  Cough    ED Discharge Orders         Ordered    ondansetron (ZOFRAN ODT) 8 MG disintegrating tablet  Every 8 hours PRN     12/22/17 1435    benzonatate (TESSALON) 100 MG capsule  Every 8 hours     12/22/17 1435           Jeannett Senior, PA-C 12/22/17 1541    Lacretia Leigh, MD 12/24/17 [phone]

## 2017-12-22 NOTE — Discharge Instructions (Addendum)
Take Zofran as prescribed as needed for nausea and vomiting.  Tessalon for cough.  Drink plenty of fluids.  Rest.  Follow-up with family doctor if not improving.  Return if worsening symptoms.

## 2018-04-21 ENCOUNTER — Ambulatory Visit: Payer: BLUE CROSS/BLUE SHIELD | Admitting: Family Medicine

## 2018-05-16 ENCOUNTER — Ambulatory Visit (INDEPENDENT_AMBULATORY_CARE_PROVIDER_SITE_OTHER): Payer: BLUE CROSS/BLUE SHIELD

## 2018-05-16 ENCOUNTER — Encounter (HOSPITAL_COMMUNITY): Payer: Self-pay

## 2018-05-16 ENCOUNTER — Other Ambulatory Visit: Payer: Self-pay

## 2018-05-16 ENCOUNTER — Ambulatory Visit (HOSPITAL_COMMUNITY)
Admission: EM | Admit: 2018-05-16 | Discharge: 2018-05-16 | Disposition: A | Discharge status: Home / Self Care | Payer: BLUE CROSS/BLUE SHIELD | Attending: Internal Medicine | Admitting: Internal Medicine

## 2018-05-16 DIAGNOSIS — S3992XA Unspecified injury of lower back, initial encounter: Secondary | ICD-10-CM | POA: Diagnosis not present

## 2018-05-16 DIAGNOSIS — W010XXA Fall on same level from slipping, tripping and stumbling without subsequent striking against object, initial encounter: Secondary | ICD-10-CM

## 2018-05-16 DIAGNOSIS — M545 Low back pain: Secondary | ICD-10-CM | POA: Diagnosis not present

## 2018-05-16 DIAGNOSIS — S39012A Strain of muscle, fascia and tendon of lower back, initial encounter: Secondary | ICD-10-CM | POA: Insufficient documentation

## 2018-05-16 MED ORDER — MELOXICAM 7.5 MG PO TABS: 7.5000 mg | ORAL_TABLET | Freq: Two times a day (BID) | ORAL | 0 refills | Status: DC | PRN

## 2018-05-16 MED ORDER — CYCLOBENZAPRINE HCL 10 MG PO TABS: 10.0000 mg | ORAL_TABLET | Freq: Two times a day (BID) | ORAL | 0 refills | Status: DC | PRN
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
## 2018-05-16 NOTE — ED Triage Notes (Signed)
Pt back pain and a knot on her left wrist. Pt has been taking tylenol and heat nothing is working.

## 2018-05-22 ENCOUNTER — Ambulatory Visit (HOSPITAL_COMMUNITY)
Admission: EM | Admit: 2018-05-22 | Discharge: 2018-05-22 | Disposition: A | Discharge status: Home / Self Care | Payer: BLUE CROSS/BLUE SHIELD | Attending: Family Medicine | Admitting: Family Medicine

## 2018-05-22 ENCOUNTER — Other Ambulatory Visit: Payer: Self-pay

## 2018-05-22 ENCOUNTER — Encounter (HOSPITAL_COMMUNITY): Payer: Self-pay | Admitting: Emergency Medicine

## 2018-05-22 DIAGNOSIS — M545 Low back pain, unspecified: Secondary | ICD-10-CM

## 2018-05-22 LAB — POCT URINALYSIS DIP (DEVICE)
BILIRUBIN URINE: NEGATIVE
Glucose, UA: NEGATIVE mg/dL
Ketones, ur: NEGATIVE mg/dL
Leukocytes, UA: NEGATIVE
NITRITE: NEGATIVE
PROTEIN: NEGATIVE mg/dL
Specific Gravity, Urine: 1.02 (ref 1.005–1.030)
UROBILINOGEN UA: 0.2 mg/dL (ref 0.0–1.0)
pH: 7 (ref 5.0–8.0)

## 2018-05-22 MED ORDER — HYDROCODONE-ACETAMINOPHEN 5-325 MG PO TABS: 1.0000 | ORAL_TABLET | Freq: Four times a day (QID) | ORAL | 0 refills | Status: DC | PRN

## 2018-05-22 MED ORDER — PREDNISONE 10 MG (21) PO TBPK: ORAL_TABLET | Freq: Every day | ORAL | 0 refills | Status: DC

## 2018-05-22 NOTE — ED Triage Notes (Signed)
Back pain for a week.  Seen at [facility], medication prescribed has not helped .  Pain across lower back, one side as painful as the other side.  Pain is not radiating into legs.

## 2018-05-22 NOTE — Discharge Instructions (Signed)
Be aware, pain medications may cause drowsiness. Please do not drive, operate heavy machinery or make important decisions while on this medication, it can cloud your judgement.  HOME CARE INSTRUCTIONS: For many people, back pain returns. Since low back pain is rarely dangerous, it is often a condition that people can learn to manage on their own. Please remain active. It is stressful on the back to sit or stand in one place. Do not sit, drive, or stand in one place for more than 30 minutes at a time. Take short walks on level surfaces as soon as pain allows. Try to increase the length of time you walk each day. Do not stay in bed. Resting more than 1 or 2 days can delay your recovery. Do not avoid exercise or work. Your body is made to move. It is not dangerous to be active, even though your back may hurt. Your back will likely heal faster if you return to being active before your pain is gone. Over-the-counter medicines to reduce pain and inflammation are often the most helpful.  SEEK MEDICAL CARE IF: You have pain that is not relieved with rest or medicine. You have pain that does not improve in 1 week. You have new symptoms. You are generally not feeling well.  SEEK IMMEDIATE MEDICAL CARE IF: You have pain that radiates from your back into your legs. You develop new bowel or bladder control problems. You have unusual weakness or numbness in your arms or legs. You develop nausea or vomiting. You develop abdominal pain. You feel faint.

## 2018-05-22 NOTE — ED Provider Notes (Signed)
[place]   [id number] 05/22/18 Arrival Time: 9169  ASSESSMENT & PLAN:  1. Acute bilateral low back pain without sciatica    See AVS for d/c instruction. Normal neurological exam today. No need for urgent imaging. Discussed.  Meds ordered this encounter  Medications  . HYDROcodone-acetaminophen (NORCO/VICODIN) 5-325 MG tablet    Sig: Take 1 tablet by mouth every 6 (six) hours as needed for moderate pain or severe pain.    Dispense:  8 tablet    Refill:  0  . predniSONE (STERAPRED UNI-PAK 21 TAB) 10 MG (21) TBPK tablet    Sig: Take by mouth daily. Take as directed.    Dispense:  21 tablet    Refill:  0   [place] Controlled Substances Registry consulted for this patient. I feel the risk/benefit ratio today is favorable for proceeding with this prescription for a controlled substance. Medication sedation precautions given.  Encourage ROM/movement as tolerated.  Follow-up Information    Bonnita Hollow, MD.   Specialty:  Family Medicine Why:  Keep your scheduled appointment. Contact information: [address] [place] [address] [phone]           Clinical Course     Noted. Discussed need to f/u and recheck. Reports taking her medications as directed.  BP(!): 206/91 [BH]    Clinical Course User Index [BH] Vanessa Kick, MD    Reviewed expectations re: course of current medical issues. Questions answered. Outlined signs and symptoms indicating need for more acute intervention. Patient verbalized understanding. After Visit Summary given.   SUBJECTIVE: History from: patient.  Susan Sanders is a 48 y.o. female who presents with complaint of intermittent bilateral lower back discomfort. Onset gradual, over the past couple of weeks. Injury/trama: no. History of back problems: yes; occasional and similar to current. Seen here on 05/16/2017; reports meds not helping. Discomfort described as aching without radiation. Certain movements exacerbate the  described discomfort. Better with rest. Extremity sensation changes or weakness: none. Ambulatory without difficulty. Normal bowel/bladder habits: yes. No associated abdominal pain/n/v. Reports no fevers, IV drug use, or recent back surgeries or procedures.  ROS: As per HPI. All other systems negative    OBJECTIVE:  Vitals:   05/22/18 1713  BP: (!) 206/91  Pulse: 76  Resp: 18  Temp: 98.1 F (36.7 C)  TempSrc: Oral  SpO2: 100%    General appearance: alert; no distress Neck: supple with FROM; without midline tenderness CV: RRR without murmer Lungs: unlabored respirations; symmetrical air entry Abdomen: soft, non-tender; non-distended Back: moderate bilateral tenderness of her lower paraspinal musculature toward buttocks; FROM at waist; bruising: none; without midline tenderness Extremities: no edema; symmetrical with no gross deformities; normal ROM of bilateral lower extremities Skin: warm and dry Neurologic: normal gait; normal reflexes of RLE and LLE; normal sensation of RLE and LLE; normal strength of RLE and LLE Psychological: alert and cooperative; normal mood and affect  Labs: Results for orders placed or performed during the hospital encounter of 05/22/18  POCT urinalysis dip (device)  Result Value Ref Range   Glucose, UA NEGATIVE NEGATIVE mg/dL   Bilirubin Urine NEGATIVE NEGATIVE   Ketones, ur NEGATIVE NEGATIVE mg/dL   Specific Gravity, Urine 1.020 1.005 - 1.030   Hgb urine dipstick TRACE (A) NEGATIVE   pH 7.0 5.0 - 8.0   Protein, ur NEGATIVE NEGATIVE mg/dL   Urobilinogen, UA 0.2 0.0 - 1.0 mg/dL   Nitrite NEGATIVE NEGATIVE   Leukocytes, UA NEGATIVE NEGATIVE   Labs Reviewed  POCT URINALYSIS DIP (DEVICE) - Abnormal; Notable for the following components:      Result Value   Hgb urine dipstick TRACE (*)    All other components within normal limits    Allergies  Allergen Reactions  . Latex Shortness Of Breath    Itching and burns skin  . Amoxicillin Itching  and Swelling    Has patient had a PCN reaction causing immediate rash, facial/tongue/throat swelling, SOB or lightheadedness with hypotension: Yes Has patient had a PCN reaction causing severe rash involving mucus membranes or skin necrosis: Yes Has patient had a PCN reaction that required hospitalization: Yes Has patient had a PCN reaction occurring within the last 10 years: Yes If all of the above answers are "NO", then may proceed with Cephalosporin use.   . Naproxen Hives and Itching    Takes IBU without difficulty  . Tramadol Hives and Itching    Takes IBU without difficulty    Past Medical History:  Diagnosis Date  . Anxiety   . Asthma   . Cellulitis   . Chest pain   . Chronic back pain greater than 3 months duration   . Complication of anesthesia    difficult waking up "  . Depression   . Depression   . Eczema   . Hypertension   . Scoliosis    Social History   Socioeconomic History  . Marital status: Divorced    Spouse name: Not on file  . Number of children: Not on file  . Years of education: Not on file  . Highest education level: Not on file  Occupational History  . Not on file  Social Needs  . Financial resource strain: Not on file  . Food insecurity:    Worry: Not on file    Inability: Not on file  . Transportation needs:    Medical: Not on file    Non-medical: Not on file  Tobacco Use  . Smoking status: Former Smoker    Years: 14.00    Last attempt to quit: 09/20/2010    Years since quitting: 7.6  . Smokeless tobacco: Never Used  Substance and Sexual Activity  . Alcohol use: Yes    Comment: occasional   . Drug use: No  . Sexual activity: Not on file  Lifestyle  . Physical activity:    Days per week: Not on file    Minutes per session: Not on file  . Stress: Not on file  Relationships  . Social connections:    Talks on phone: Not on file    Gets together: Not on file    Attends religious service: Not on file    Active member of club or  organization: Not on file    Attends meetings of clubs or organizations: Not on file    Relationship status: Not on file  . Intimate partner violence:    Fear of current or ex partner: Not on file    Emotionally abused: Not on file    Physically abused: Not on file    Forced sexual activity: Not on file  Other Topics Concern  . Not on file  Social History Narrative  . Not on file   Family History  Problem Relation Age of Onset  . Hypertension Mother    Past Surgical History:  Procedure Laterality Date  . TUBAL LIGATION       Vanessa Kick, MD 05/27/18 [id number]

## 2018-05-23 ENCOUNTER — Ambulatory Visit: Payer: BLUE CROSS/BLUE SHIELD

## 2018-05-29 ENCOUNTER — Encounter (HOSPITAL_COMMUNITY): Payer: Self-pay | Admitting: Emergency Medicine

## 2018-05-29 ENCOUNTER — Emergency Department (HOSPITAL_COMMUNITY): Payer: BLUE CROSS/BLUE SHIELD

## 2018-05-29 ENCOUNTER — Observation Stay (HOSPITAL_COMMUNITY)
Admission: EM | Admit: 2018-05-29 | Discharge: 2018-05-30 | Disposition: A | Discharge status: Home / Self Care | Payer: BLUE CROSS/BLUE SHIELD | Attending: Family Medicine | Admitting: Family Medicine

## 2018-05-29 ENCOUNTER — Other Ambulatory Visit: Payer: Self-pay

## 2018-05-29 DIAGNOSIS — Z8249 Family history of ischemic heart disease and other diseases of the circulatory system: Secondary | ICD-10-CM | POA: Diagnosis not present

## 2018-05-29 DIAGNOSIS — D72829 Elevated white blood cell count, unspecified: Secondary | ICD-10-CM | POA: Diagnosis not present

## 2018-05-29 DIAGNOSIS — I160 Hypertensive urgency: Secondary | ICD-10-CM

## 2018-05-29 DIAGNOSIS — Z885 Allergy status to narcotic agent status: Secondary | ICD-10-CM | POA: Insufficient documentation

## 2018-05-29 DIAGNOSIS — Z880 Allergy status to penicillin: Secondary | ICD-10-CM | POA: Insufficient documentation

## 2018-05-29 DIAGNOSIS — Z79899 Other long term (current) drug therapy: Secondary | ICD-10-CM | POA: Diagnosis not present

## 2018-05-29 DIAGNOSIS — J45909 Unspecified asthma, uncomplicated: Secondary | ICD-10-CM | POA: Diagnosis not present

## 2018-05-29 DIAGNOSIS — F419 Anxiety disorder, unspecified: Secondary | ICD-10-CM | POA: Insufficient documentation

## 2018-05-29 DIAGNOSIS — R109 Unspecified abdominal pain: Secondary | ICD-10-CM | POA: Diagnosis present

## 2018-05-29 DIAGNOSIS — D259 Leiomyoma of uterus, unspecified: Secondary | ICD-10-CM | POA: Diagnosis not present

## 2018-05-29 DIAGNOSIS — Z791 Long term (current) use of non-steroidal anti-inflammatories (NSAID): Secondary | ICD-10-CM | POA: Insufficient documentation

## 2018-05-29 DIAGNOSIS — M5442 Lumbago with sciatica, left side: Secondary | ICD-10-CM | POA: Diagnosis not present

## 2018-05-29 DIAGNOSIS — Z888 Allergy status to other drugs, medicaments and biological substances status: Secondary | ICD-10-CM | POA: Insufficient documentation

## 2018-05-29 DIAGNOSIS — E871 Hypo-osmolality and hyponatremia: Secondary | ICD-10-CM | POA: Diagnosis not present

## 2018-05-29 DIAGNOSIS — Z6826 Body mass index (BMI) 26.0-26.9, adult: Secondary | ICD-10-CM | POA: Diagnosis not present

## 2018-05-29 DIAGNOSIS — F4310 Post-traumatic stress disorder, unspecified: Secondary | ICD-10-CM | POA: Insufficient documentation

## 2018-05-29 DIAGNOSIS — I10 Essential (primary) hypertension: Secondary | ICD-10-CM | POA: Insufficient documentation

## 2018-05-29 DIAGNOSIS — M5136 Other intervertebral disc degeneration, lumbar region: Secondary | ICD-10-CM | POA: Diagnosis not present

## 2018-05-29 DIAGNOSIS — Z91040 Latex allergy status: Secondary | ICD-10-CM | POA: Insufficient documentation

## 2018-05-29 DIAGNOSIS — M6281 Muscle weakness (generalized): Secondary | ICD-10-CM | POA: Insufficient documentation

## 2018-05-29 DIAGNOSIS — R1030 Lower abdominal pain, unspecified: Secondary | ICD-10-CM | POA: Diagnosis not present

## 2018-05-29 DIAGNOSIS — G43909 Migraine, unspecified, not intractable, without status migrainosus: Secondary | ICD-10-CM | POA: Diagnosis not present

## 2018-05-29 DIAGNOSIS — E663 Overweight: Secondary | ICD-10-CM | POA: Insufficient documentation

## 2018-05-29 DIAGNOSIS — K449 Diaphragmatic hernia without obstruction or gangrene: Secondary | ICD-10-CM | POA: Diagnosis not present

## 2018-05-29 DIAGNOSIS — D219 Benign neoplasm of connective and other soft tissue, unspecified: Secondary | ICD-10-CM | POA: Diagnosis not present

## 2018-05-29 DIAGNOSIS — Z87891 Personal history of nicotine dependence: Secondary | ICD-10-CM | POA: Insufficient documentation

## 2018-05-29 DIAGNOSIS — R2681 Unsteadiness on feet: Secondary | ICD-10-CM | POA: Diagnosis not present

## 2018-05-29 DIAGNOSIS — K219 Gastro-esophageal reflux disease without esophagitis: Secondary | ICD-10-CM | POA: Diagnosis not present

## 2018-05-29 DIAGNOSIS — F329 Major depressive disorder, single episode, unspecified: Secondary | ICD-10-CM | POA: Insufficient documentation

## 2018-05-29 DIAGNOSIS — W19XXXA Unspecified fall, initial encounter: Secondary | ICD-10-CM | POA: Diagnosis not present

## 2018-05-29 DIAGNOSIS — R2689 Other abnormalities of gait and mobility: Secondary | ICD-10-CM | POA: Insufficient documentation

## 2018-05-29 DIAGNOSIS — G8929 Other chronic pain: Secondary | ICD-10-CM

## 2018-05-29 DIAGNOSIS — M549 Dorsalgia, unspecified: Secondary | ICD-10-CM | POA: Diagnosis not present

## 2018-05-29 DIAGNOSIS — M5441 Lumbago with sciatica, right side: Secondary | ICD-10-CM | POA: Diagnosis not present

## 2018-05-29 DIAGNOSIS — Z886 Allergy status to analgesic agent status: Secondary | ICD-10-CM | POA: Insufficient documentation

## 2018-05-29 HISTORY — DX: Other chronic pain: G89.29

## 2018-05-29 HISTORY — DX: Anemia, unspecified: D64.9

## 2018-05-29 HISTORY — DX: Unspecified asthma, uncomplicated: J45.909

## 2018-05-29 HISTORY — DX: Dorsalgia, unspecified: M54.9

## 2018-05-29 LAB — URINALYSIS, ROUTINE W REFLEX MICROSCOPIC
Bacteria, UA: NONE SEEN
Bilirubin Urine: NEGATIVE
Glucose, UA: NEGATIVE mg/dL
Ketones, ur: 5 mg/dL — AB
Leukocytes, UA: NEGATIVE
Nitrite: NEGATIVE
PH: 6 (ref 5.0–8.0)
Protein, ur: NEGATIVE mg/dL
SPECIFIC GRAVITY, URINE: 1.018 (ref 1.005–1.030)

## 2018-05-29 LAB — RAPID URINE DRUG SCREEN, HOSP PERFORMED
Amphetamines: NOT DETECTED
BENZODIAZEPINES: NOT DETECTED
Barbiturates: NOT DETECTED
Cocaine: NOT DETECTED
Opiates: POSITIVE — AB
Tetrahydrocannabinol: POSITIVE — AB

## 2018-05-29 LAB — I-STAT TROPONIN, ED: TROPONIN I, POC: 0 ng/mL (ref 0.00–0.08)

## 2018-05-29 LAB — I-STAT CHEM 8, ED
BUN: 5 mg/dL — ABNORMAL LOW (ref 6–20)
CREATININE: 0.5 mg/dL (ref 0.44–1.00)
Calcium, Ion: 1.12 mmol/L — ABNORMAL LOW (ref 1.15–1.40)
Chloride: 104 mmol/L (ref 98–111)
Glucose, Bld: 125 mg/dL — ABNORMAL HIGH (ref 70–99)
HCT: 40 % (ref 36.0–46.0)
Hemoglobin: 13.6 g/dL (ref 12.0–15.0)
Potassium: 3.8 mmol/L (ref 3.5–5.1)
Sodium: 134 mmol/L — ABNORMAL LOW (ref 135–145)
TCO2: 22 mmol/L (ref 22–32)

## 2018-05-29 LAB — HEPATIC FUNCTION PANEL
ALT: 14 U/L (ref 0–44)
AST: 20 U/L (ref 15–41)
Albumin: 3.9 g/dL (ref 3.5–5.0)
Alkaline Phosphatase: 63 U/L (ref 38–126)
Total Bilirubin: 0.7 mg/dL (ref 0.3–1.2)
Total Protein: 8.1 g/dL (ref 6.5–8.1)

## 2018-05-29 LAB — CBC
HEMATOCRIT: 37.5 % (ref 36.0–46.0)
Hemoglobin: 11.7 g/dL — ABNORMAL LOW (ref 12.0–15.0)
MCH: 22.8 pg — ABNORMAL LOW (ref 26.0–34.0)
MCHC: 31.2 g/dL (ref 30.0–36.0)
MCV: 73.1 fL — ABNORMAL LOW (ref 80.0–100.0)
Platelets: 443 10*3/uL — ABNORMAL HIGH (ref 150–400)
RBC: 5.13 MIL/uL — ABNORMAL HIGH (ref 3.87–5.11)
RDW: 18 % — ABNORMAL HIGH (ref 11.5–15.5)
WBC: 13.2 10*3/uL — AB (ref 4.0–10.5)
nRBC: 0 % (ref 0.0–0.2)

## 2018-05-29 LAB — LIPASE, BLOOD: LIPASE: 31 U/L (ref 11–51)

## 2018-05-29 LAB — I-STAT CG4 LACTIC ACID, ED: Lactic Acid, Venous: 1.57 mmol/L (ref 0.5–1.9)

## 2018-05-29 LAB — POC URINE PREG, ED: Preg Test, Ur: NEGATIVE

## 2018-05-29 LAB — D-DIMER, QUANTITATIVE: D-Dimer, Quant: 1.21 ug/mL-FEU — ABNORMAL HIGH (ref 0.00–0.50)

## 2018-05-29 MED ORDER — PANTOPRAZOLE SODIUM 40 MG IV SOLR
40.0000 mg | INTRAVENOUS | Status: DC
  Administered 2018-05-29: 40 mg via INTRAVENOUS
  Filled 2018-05-29: qty 40

## 2018-05-29 MED ORDER — IOPAMIDOL (ISOVUE-370) INJECTION 76%
INTRAVENOUS | Status: AC
  Filled 2018-05-29: qty 100

## 2018-05-29 MED ORDER — ONDANSETRON HCL 4 MG PO TABS: 4.0000 mg | ORAL_TABLET | Freq: Four times a day (QID) | ORAL | Status: DC | PRN

## 2018-05-29 MED ORDER — GABAPENTIN 600 MG PO TABS
300.0000 mg | ORAL_TABLET | Freq: Three times a day (TID) | ORAL | Status: DC
  Administered 2018-05-29 – 2018-05-30 (×3): 300 mg via ORAL
  Filled 2018-05-29 (×3): qty 1

## 2018-05-29 MED ORDER — OXYCODONE-ACETAMINOPHEN 5-325 MG PO TABS
1.0000 | ORAL_TABLET | Freq: Four times a day (QID) | ORAL | Status: DC | PRN
  Administered 2018-05-29: 1 via ORAL
  Filled 2018-05-29: qty 1

## 2018-05-29 MED ORDER — AMLODIPINE BESYLATE 10 MG PO TABS
10.0000 mg | ORAL_TABLET | Freq: Every day | ORAL | Status: DC
  Administered 2018-05-29 – 2018-05-30 (×2): 10 mg via ORAL
  Filled 2018-05-29 (×2): qty 1

## 2018-05-29 MED ORDER — ENOXAPARIN SODIUM 40 MG/0.4ML ~~LOC~~ SOLN
40.0000 mg | SUBCUTANEOUS | Status: DC
  Administered 2018-05-29: 40 mg via SUBCUTANEOUS
  Filled 2018-05-29: qty 0.4

## 2018-05-29 MED ORDER — HYDROMORPHONE HCL 1 MG/ML IJ SOLN
1.0000 mg | Freq: Once | INTRAMUSCULAR | Status: AC
  Administered 2018-05-29: 1 mg via INTRAVENOUS
  Filled 2018-05-29: qty 1

## 2018-05-29 MED ORDER — HYDROCHLOROTHIAZIDE 10 MG/ML ORAL SUSPENSION
6.2500 mg | Freq: Every day | ORAL | Status: DC
  Administered 2018-05-29: 6.25 mg via ORAL
  Filled 2018-05-29 (×2): qty 1.25

## 2018-05-29 MED ORDER — ONDANSETRON HCL 4 MG/2ML IJ SOLN: 4.0000 mg | Freq: Four times a day (QID) | INTRAMUSCULAR | Status: DC | PRN

## 2018-05-29 MED ORDER — HYDRALAZINE HCL 20 MG/ML IJ SOLN
10.0000 mg | Freq: Once | INTRAMUSCULAR | Status: AC
  Administered 2018-05-29: 10 mg via INTRAVENOUS
  Filled 2018-05-29: qty 1

## 2018-05-29 MED ORDER — SODIUM CHLORIDE 0.9 % IV BOLUS
1000.0000 mL | Freq: Once | INTRAVENOUS | Status: AC
  Administered 2018-05-29: 1000 mL via INTRAVENOUS

## 2018-05-29 MED ORDER — LORAZEPAM 0.5 MG PO TABS
0.5000 mg | ORAL_TABLET | Freq: Three times a day (TID) | ORAL | Status: DC
  Administered 2018-05-29 – 2018-05-30 (×3): 0.5 mg via ORAL
  Filled 2018-05-29 (×3): qty 1

## 2018-05-29 MED ORDER — ACETAMINOPHEN 325 MG PO TABS
650.0000 mg | ORAL_TABLET | Freq: Four times a day (QID) | ORAL | Status: DC | PRN
  Administered 2018-05-29 – 2018-05-30 (×3): 650 mg via ORAL
  Filled 2018-05-29 (×4): qty 2

## 2018-05-29 MED ORDER — ONDANSETRON HCL 4 MG/2ML IJ SOLN
4.0000 mg | Freq: Once | INTRAMUSCULAR | Status: AC
  Administered 2018-05-29: 4 mg via INTRAVENOUS
  Filled 2018-05-29: qty 2

## 2018-05-29 MED ORDER — HYDRALAZINE HCL 20 MG/ML IJ SOLN
10.0000 mg | Freq: Four times a day (QID) | INTRAMUSCULAR | Status: DC | PRN
  Administered 2018-05-29: 10 mg via INTRAVENOUS
  Filled 2018-05-29: qty 1

## 2018-05-29 MED ORDER — IOPAMIDOL (ISOVUE-370) INJECTION 76%
100.0000 mL | Freq: Once | INTRAVENOUS | Status: AC | PRN
  Administered 2018-05-29: 100 mL via INTRAVENOUS

## 2018-05-29 NOTE — ED Provider Notes (Signed)
Medical screening examination/treatment/procedure(s) were conducted as a shared visit with non-physician practitioner(s) and myself.  I personally evaluated the patient during the encounter.  EKG Interpretation  Date/Time:  Thursday May 29 2018 07:34:34 EST Ventricular Rate:  90 PR Interval:    QRS Duration: 78 QT Interval:  363 QTC Calculation: 445 R Axis:   24 Text Interpretation:  Sinus rhythm Borderline short PR interval normal Confirmed by Charlesetta Shanks [phone]) on 05/29/2018 7:39:23 AM Also confirmed by Charlesetta Shanks [phone]), editor Philomena Doheny [phone])  on 05/29/2018 8:01:47 AM Patient reports that she is having severe pain in her back that also is wrapping around into her lower abdomen or legs.  She reports that it was gradual in onset but is gotten more more severe despite treatment.  She also describes tingling in her right arm and leg.  Patient is alert with clear mental status patient does not have respiratory distress.  She however appears to be in severe pain.  She is somewhat writhing about on the stretcher.  Heart is regular.  Lungs are clear.  No appreciable murmur.  Distal pulses intact.  She endorses significant pain to the lower and central abdomen.  Neurologic exam is intact and patient reports subjective feeling of decreased sensation to light touch on the right upper and lower extremities.  Patient is significantly hypertensive.  I agree with plan of CT angiogram to rule out dissection.  Will initiate pain control.  Patient has however alert and appropriate she does not have focal neurologic dysfunction her mental status is clear.  We will proceed with diagnostic evaluation.  Negative findings on dissection study.  Patient's blood pressure responding to hydralazine.  She continues however to complain of significant pain.  Etiology unclear.  Will plan for admission for observation, pain control and other diagnostic studies as indicated.   Charlesetta Shanks, MD 05/31/18  [phone]

## 2018-05-29 NOTE — ED Triage Notes (Signed)
Patient reports back pain, right arm pain, and leg pain- rating pain at 10 on a 0-10 pain. She states that it has been going on for about a week. PT also reports pain in her stomach-states "all over my stomach." She reports that she has taken several pain medicines but does not feel better.  Daughter at bedside

## 2018-05-29 NOTE — Progress Notes (Signed)
Paged Dr. Yisroel Ramming due to patient c/o dizziness and feeling funny. She denies shortness of breath or chest pain. Instructed patient to alert the RN should she develop either of those symptoms. Dr. Yisroel Ramming stated to continue monitoring the BP.   Bed alarm on and instructed patient not to get out of bed without a medical staff member present.

## 2018-05-29 NOTE — ED Provider Notes (Addendum)
[facility] EMERGENCY DEPARTMENT Provider Note   CSN: [id number] Arrival date & time: 05/29/18  0720     History   Chief Complaint No chief complaint on file.   HPI Susan Sanders is a 48 y.o. female.  The history is provided by the patient and medical records. No language interpreter was used.     48 year old female with history of chronic back pain, chest pain, scoliosis, GERD presenting for evaluation of abdominal pain, back pain, arm pain and leg pain.  For the past 3 to 4 days patient has progressive worsening abdominal pain which radiates to her right arm and right leg as well as the back.  Pain is moderate to severe, nothing seems to make it better or worse.  She endorsed lightheadedness and dizziness.  She report "something is wrong" she mention been seen at the urgent care recently twice for her complaint and was given medication for symptoms however it did not provide any significant relief.  She does not complain of any active chest pain shortness of breath or productive cough, no dysuria or hematuria.  She does endorse some tingling sensation to her arm and leg.  Past Medical History:  Diagnosis Date  . Anxiety   . Asthma   . Cellulitis   . Chest pain   . Chronic back pain greater than 3 months duration   . Complication of anesthesia    difficult waking up "  . Depression   . Depression   . Eczema   . Hypertension   . Scoliosis     Patient Active Problem List   Diagnosis Date Noted  . Overweight 11/02/2016  . Muscle weakness (generalized)   . Syncope and collapse 02/04/2016  . Essential hypertension 07/14/2015  . Cervical spondylolysis 06/18/2014  . Healthcare maintenance 12/10/2013  . Former smoker 12/10/2013  . GERD (gastroesophageal reflux disease) 12/10/2013  . Fatigue 09/09/2013  . Pain, joint, hand, right 05/20/2013  . Leg pain, bilateral 04/22/2012  . Dyshidrotic eczema 12/26/2011  . Back pain 12/05/2010  . ALLERGIC RHINITIS  08/20/2008  . Menorrhagia with regular cycle 03/20/2007  . MIGRAINE HEADACHE 02/20/2007  . Anxiety and depression 01/23/2007    Past Surgical History:  Procedure Laterality Date  . TUBAL LIGATION       OB History    Gravida  7   Para  6   Term  4   Preterm  2   AB  1   Living  6     SAB      TAB      Ectopic  1   Multiple      Live Births               Home Medications    Prior to Admission medications   Medication Sig Start Date End Date Taking? Authorizing Provider  acetaminophen (TYLENOL) 500 MG tablet Take 2 tablets (1,000 mg total) by mouth every 6 (six) hours as needed for mild pain or fever. 06/06/16   Leo Grosser, MD  amLODipine (NORVASC) 10 MG tablet Take 1 tablet (10 mg total) by mouth daily. 07/24/17 12/22/17  Smiley Houseman, MD  amLODipine (NORVASC) 10 MG tablet Take 10 mg by mouth daily.    [provider]  benzonatate (TESSALON) 100 MG capsule Take 1 capsule (100 mg total) by mouth every 8 (eight) hours. 12/22/17   Kirichenko, Lahoma Rocker, PA-C  cyclobenzaprine (FLEXERIL) 10 MG tablet Take 1 tablet (10 mg total) by mouth  2 (two) times daily as needed for muscle spasms. 05/16/18   Harrie Foreman, MD  docusate sodium (COLACE) 100 MG capsule Take 1 capsule (100 mg total) by mouth 2 (two) times daily. 07/25/17   Smiley Houseman, MD  ferrous sulfate 325 (65 FE) MG tablet Take 1 tablet (325 mg total) by mouth 2 (two) times daily with a meal. 07/25/17   Smiley Houseman, MD  hydrochlorothiazide (MICROZIDE) 12.5 MG capsule Take 1 capsule (12.5 mg total) by mouth daily. 07/24/17   Smiley Houseman, MD  HYDROcodone-acetaminophen (NORCO/VICODIN) 5-325 MG tablet Take 1 tablet by mouth every 6 (six) hours as needed for moderate pain or severe pain. 05/22/18   Vanessa Kick, MD  meloxicam (MOBIC) 7.5 MG tablet Take 1 tablet (7.5 mg total) by mouth 2 (two) times daily as needed for pain. Do NOT take on an empty stomach.  Take benadryl for  itching 05/16/18   Harrie Foreman, MD  Multiple Vitamins-Calcium (ONE-A-DAY WOMENS PO) Take 1 tablet by mouth daily.    [provider]  ondansetron (ZOFRAN ODT) 8 MG disintegrating tablet Take 1 tablet (8 mg total) by mouth every 8 (eight) hours as needed for nausea or vomiting. 12/22/17   Kirichenko, Lahoma Rocker, PA-C  predniSONE (STERAPRED UNI-PAK 21 TAB) 10 MG (21) TBPK tablet Take by mouth daily. Take as directed. 05/22/18   Vanessa Kick, MD    Family History Family History  Problem Relation Age of Onset  . Hypertension Mother     Social History Social History   Tobacco Use  . Smoking status: Former Smoker    Years: 14.00    Last attempt to quit: 09/20/2010    Years since quitting: 7.6  . Smokeless tobacco: Never Used  Substance Use Topics  . Alcohol use: Yes    Comment: occasional   . Drug use: No     Allergies   Latex; Amoxicillin; Naproxen; and Tramadol   Review of Systems Review of Systems  All other systems reviewed and are negative.    Physical Exam Updated Vital Signs BP (!) 179/87   Pulse 94   Temp 98.7 F (37.1 C) (Oral)   Resp 19   Ht 5\' 5"  (1.651 m)   Wt 72.6 kg   LMP 05/02/2018 (Exact Date)   SpO2 100%   BMI 26.63 kg/m   Physical Exam Vitals signs and nursing note reviewed.  Constitutional:      General: She is in acute distress.     Appearance: She is well-developed. She is ill-appearing.     Comments: Patient appears uncomfortable, moaning and hypertensive.  HENT:     Head: Atraumatic.  Eyes:     Conjunctiva/sclera: Conjunctivae normal.  Neck:     Musculoskeletal: Neck supple. No neck rigidity.  Cardiovascular:     Rate and Rhythm: Normal rate and regular rhythm.  Pulmonary:     Effort: Pulmonary effort is normal.     Breath sounds: Normal breath sounds.  Abdominal:     Palpations: Abdomen is soft.     Tenderness: There is abdominal tenderness (Diffuse abdominal tenderness, no pulsatile mass or abdominal bruit appreciated.).   Musculoskeletal:        General: Tenderness (Diffuse tenderness about the right arm and right leg, compartment soft, intact distal pulses however appreciable weakness noted.) present.     Comments: No significant midline spine tenderness appreciated.  Lymphadenopathy:     Cervical: No cervical adenopathy.  Skin:    Findings: No rash.  Neurological:     Mental Status: She is alert and oriented to person, place, and time.      ED Treatments / Results  Labs (all labs ordered are listed, but only abnormal results are displayed) Labs Reviewed  CBC - Abnormal; Notable for the following components:      Result Value   WBC 13.2 (*)    RBC 5.13 (*)    Hemoglobin 11.7 (*)    MCV 73.1 (*)    MCH 22.8 (*)    RDW 18.0 (*)    Platelets 443 (*)    All other components within normal limits  URINALYSIS, ROUTINE W REFLEX MICROSCOPIC - Abnormal; Notable for the following components:   Color, Urine STRAW (*)    Hgb urine dipstick MODERATE (*)    Ketones, ur 5 (*)    All other components within normal limits  I-STAT CHEM 8, ED - Abnormal; Notable for the following components:   Sodium 134 (*)    BUN 5 (*)    Glucose, Bld 125 (*)    Calcium, Ion 1.12 (*)    All other components within normal limits  HEPATIC FUNCTION PANEL  LIPASE, BLOOD  D-DIMER, QUANTITATIVE (NOT AT [facility])  I-STAT TROPONIN, ED  I-STAT CG4 LACTIC ACID, ED  POC URINE PREG, ED    EKG EKG Interpretation  Date/Time:  Thursday May 29 2018 07:34:34 EST Ventricular Rate:  90 PR Interval:    QRS Duration: 78 QT Interval:  363 QTC Calculation: 445 R Axis:   24 Text Interpretation:  Sinus rhythm Borderline short PR interval normal Confirmed by Charlesetta Shanks [phone]) on 05/29/2018 7:39:23 AM Also confirmed by Charlesetta Shanks [phone]), editor Philomena Doheny [phone])  on 05/29/2018 8:01:47 AM   Radiology Ct Angio Chest/abd/pel For Dissection W And/or Wo Contrast  Result Date: 05/29/2018 CLINICAL DATA:  Back and abdominal  pain over the last week. Assess for vascular dissection. EXAM: CT ANGIOGRAPHY CHEST, ABDOMEN AND PELVIS TECHNIQUE: Multidetector CT imaging through the chest, abdomen and pelvis was performed using the standard protocol during bolus administration of intravenous contrast. Multiplanar reconstructed images and MIPs were obtained and reviewed to evaluate the vascular anatomy. CONTRAST:  157mL ISOVUE-370 IOPAMIDOL (ISOVUE-370) INJECTION 76% COMPARISON:  None. FINDINGS: CTA CHEST FINDINGS Cardiovascular: Heart size is mildly enlarged. No coronary artery calcification. Thoracic aorta is normal without enlargement of the ascending aorta or any evidence of dissection or atherosclerotic plaque. Brachiocephalic origins are normal. Mediastinum/Nodes: No mass or lymphadenopathy. Lungs/Pleura: Lung parenchyma is normal and clear. No emphysema. No infiltrate or collapse. Insignificant 3 mm subpleural nodule laterally in the right lung image 80. No pleural fluid. Musculoskeletal: Thoracolumbar spinal curvature. No evidence of fracture or focal bone lesion. Review of the MIP images confirms the above findings. CTA ABDOMEN AND PELVIS FINDINGS VASCULAR Aorta: No aortic aneurysm, dissection or atherosclerotic change in the proximal portion. There is some atherosclerotic plaque of the distal abdominal aorta, noncalcified. Celiac: Normal SMA: Normal Renals: Normal.  Single on each side. IMA: 50% origin stenosis.  Otherwise normal. Inflow: Normal Veins: Normal Review of the MIP images confirms the above findings. NON-VASCULAR Hepatobiliary: Normal Pancreas: Normal Spleen: Normal Adrenals/Urinary Tract: Adrenal glands are normal. Kidneys are normal. Bladder is normal. Stomach/Bowel: No bowel pathology is seen. Lymphatic: No mass or lymphadenopathy. Reproductive: Markedly enlarged uterus probably due to a large leiomyoma. This measures 13 cm in length with a transverse diameter up to 12 x 9 cm. Other: No free fluid or air. Musculoskeletal:  Spinal curvature as  noted above. Degenerative disc disease and facet disease at L5-S1. Review of the MIP images confirms the above findings. IMPRESSION: No evidence of acute aortic dissection or acute vascular pathology. There is some atherosclerotic plaque of the distal abdominal aorta but no aneurysm or flow limiting stenosis. 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 Spinal scoliotic curvature. Lower lumbar degenerative disc disease and facet disease. Marked enlargement of the uterus probably secondary to large leiomyoma, measuring up to 13 x 12 x 9 cm. Electronically Signed   By: Nelson Chimes M.D.   On: 05/29/2018 09:28    Procedures .Critical Care Performed by: Domenic Moras, PA-C Authorized by: Domenic Moras, PA-C   Critical care provider statement:    Critical care time (minutes):  45   Critical care was time spent personally by me on the following activities:  Discussions with consultants, evaluation of patient's response to treatment, examination of patient, ordering and performing treatments and interventions, ordering and review of laboratory studies, ordering and review of radiographic studies, pulse oximetry, re-evaluation of patient's condition, obtaining history from patient or surrogate and review of old charts   (including critical care time)  Medications Ordered in ED Medications  iopamidol (ISOVUE-370) 76 % injection (has no administration in time range)  HYDROmorphone (DILAUDID) injection 1 mg (has no administration in time range)  HYDROmorphone (DILAUDID) injection 1 mg (1 mg Intravenous Given 05/29/18 0752)  ondansetron (ZOFRAN) injection 4 mg (4 mg Intravenous Given 05/29/18 0752)  sodium chloride 0.9 % bolus 1,000 mL (0 mLs Intravenous Stopped 05/29/18 0900)  hydrALAZINE (APRESOLINE) injection 10 mg (10 mg Intravenous Given 05/29/18 0826)  HYDROmorphone (DILAUDID) injection 1 mg (1 mg Intravenous Given 05/29/18 0829)  iopamidol (ISOVUE-370) 76 % injection 100 mL  (100 mLs Intravenous Contrast Given 05/29/18 0851)     Initial Impression / Assessment and Plan / ED Course  I have reviewed the triage vital signs and the nursing notes.  Pertinent labs & imaging results that were available during my care of the patient were reviewed by me and considered in my medical decision making (see chart for details).     BP (!) 179/87   Pulse 94   Temp 98.7 F (37.1 C) (Oral)   Resp 19   Ht 5\' 5"  (1.651 m)   Wt 72.6 kg   LMP 05/02/2018 (Exact Date)   SpO2 100%   BMI 26.63 kg/m    Final Clinical Impressions(s) / ED Diagnoses   Final diagnoses:  Hypertensive urgency  Leiomyoma    ED Discharge Orders    None     7:54 AM Patient here with complaints of abdominal pain, back pain, and pain to her right arm and right leg.  It does not follow any specific dermatomal pattern.  She appears uncomfortable, she is hypertensive with blood pressure 206/97.  She would benefit from a dissection study which includes chest abdomen pelvis CT scan with angiogram.  Care discussed with Dr. Vallery Ridge.  9:51 AM An abdominal pelvis CT scan with angiogram was performed.  No evidence of acute aortic dissection no acute vascular pathology.  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 This is an increased in size compare to CT scan 3 years prior.  Due to the finding of stenosis of the proximal inferior mesenteric artery, I have consulted on-call vascular surgeon, Dr. Donzetta Matters who felt  that the finding is not consistent with patient's pain has normal SMA and celiac artery on CT scan.  Patient is currently receiving hydralazine's as well as pain medication which did improve her blood pressure.  Pt is on Norvasc at home.   10:31 AM Appreciate consultation from [facility] Medicine resident who will evaluate pt and will admit for hypertensive  Urgency and further work up of her current sxs.     Domenic Moras, PA-C 05/29/18 1033    Charlesetta Shanks, MD 05/31/18 1629  ADDENDUM: CC time added   Domenic Moras, PA-C 06/11/18 2006    Charlesetta Shanks, MD 06/15/18 [phone]

## 2018-05-29 NOTE — H&P (Signed)
[facility] Admission History and Physical Service Pager: [phone]  Patient name: Susan Sanders Medical record number: [id number] Date of birth: 05-07-1971 Age: 48 y.o. Gender: female  Primary Care Provider: Bonnita Hollow, MD Consultants: Vascular surgery (by phone in ED) Code Status: full  Chief Complaint: Back pain, abdominal pain  Assessment and Plan: KASIDY GIANINO is a 48 y.o. female presenting with 4-day history of increased lower back pain and abdominal pain.  Abdominal Pain Patient with 3-4 days of abdominal pain which has been progressively worse. 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 Ischemia of bowel unlikely given imaging and normal labwork. Imaging, labwork, and exam inconsistent with appendicitis, gallbladder, or pancreatic pathology. Patient with moderate stool burden in ascending colon, but otherwise without significant finding and history of diarrhea makes constipation unlikely. Has large uterine fibroids but these are known and are apparently stable via imaging. In setting of recent steroid, NSAID use with worsening GERD the most likely etiology is felt to be GI ulcer. Given diarrhea will also check gi panel and C. Diff although these are felt to be unlikely. Could also consider abdominal wall pathology given exam, but this is less likely. - admit to inpatient family medicine, medsurg, Dr. Andria Frames - vital signs per floor routine - Stool occult card - protonix 40mg  IV daily - Clear liquid diet - percocet 5mg  q 6hours - zofran 4mg  q 6 hours - GI panel and C Diff  Hypertensive Urgency BP in 130Q systolic on admission. No sign of end-organ damage. Received hydral 10mg  in ed. Has been in 657Q systolic since that time. Will try and correct over prolonged period to ensure no hypoperfusion. Will draw UDS to ensure there are no other causes for elevated blood pressure. Likely 2/2 to pain.  Patient previously was on hctz, so will start his medication as well. - amlodipine 10mg  daily - hctz 6.25mg  - prn hydralazine - UDS  Lumbar Spine pain with sciatica Patient with symptoms consistent with known lumbar spine pain and sciatica. Will start on gabapentin. Will also receive percocet as discussed above for the abdominal pain. Will have PT and OT evaluate patient. - percocet 5mg  q 6hours - gabapentin 300mg  q 8 hours - pt/ot eval and treat  Hyponatremia Likely 2/2 decreased PO intake 2/2 abdominal pain. S/P 1L bolus. Will follow with daily cmp. - fluids if needed - Clear liquid diet - daily cmp  Hypocalcemia Likely 2/2 decreased PO intake 2/2 abdominal pain. Will monitor.  Leukocytosis Likely 2/2 steroids. Will follow with daily cbc.  GERD Known history of GERD. Does not take any medications. Likely D/C with PPI or H2 blocker. - protonix 40mg  daily  PMH is significant for cervical spondylolysis, hypertension, lumbar back pain, eczema, GERD, menorrhagia, migraine headache, overweight, syncope  FEN/GI: Clear liquid diet Prophylaxis: Lovenox  Disposition: Likely home, to MedSurg floor  History of Present Illness:  Susan Sanders is a 48 y.o. female presenting with increased lower back pain and abdominal pain.  Patient states she was in her usual state of health until 05/16/2018.  At that time she developed "really intense" back pain. She states this pain has been much worse than usual.  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  At that time she had a small extraforaminal protrusion on the left at L3-L4 which did not compress the root.  She was seen at  urgent care on both 05/16/2018 and 05/22/2018.  At the first appointment she was prescribed meloxicam, at the second appointment she was prescribed prednisone and Norco.  She states that none of these medications have helped her.  Patient states that she did develop abdominal pain on either  1/12 or 1/13.  She states abdominal pain is slowly gotten worse since then.  Patient gives inconsistent account of exactly where her normal pain started at her has progressed to, but at this time is limited to the lower abdomen.  She states it has been affecting her "all over". Patient states that the pain medications for her back pain, have not been helping for her abdominal pain. Denies any blood in stool. She felt initially she might be constipated, but stated that she has been having diarrhea for the last "several days". Has been having bad "heartburn" recently. Has history of hiatal hernia.  BP noted to be 161W systolic in ed. Upon review this was 200 at urgent care visits as well.  Patient's ED course was significant for cmp, troponin, lactic acid, cbc, ua w/ microscopy, upreg, CT angio chest/abd/pelvis, ekg 12 lead. Laboratory workup significant for na 134, ical 1.12, wbx 13.2, hgb 11.7, ua w/ moderate hgb, 5 ketones. CT angio significant for 50% stenosis of the IMA. Vascular surgery consulted via the ED and did not feel this finding was clinically significant. ekg normal. Received 1L bolus and dilaudid for pain. Hydralazine 10mg  given in ED.  Review Of Systems: Per HPI with the following additions:  Review of Systems  Constitutional: Negative for chills, fever and weight loss.  HENT: Negative for ear discharge, ear pain, hearing loss and tinnitus.   Eyes: Negative for pain and discharge.  Respiratory: Negative for cough and hemoptysis.   Cardiovascular: Negative for chest pain.  Gastrointestinal: Positive for abdominal pain and diarrhea. Negative for blood in stool, constipation, melena, nausea and vomiting.  Genitourinary: Negative for dysuria and urgency.  Musculoskeletal: Positive for back pain. Negative for joint pain and neck pain.  Neurological: Negative for dizziness and headaches.    Patient Active Problem List   Diagnosis Date Noted  . Abdominal pain 05/29/2018  . Overweight  11/02/2016  . Muscle weakness (generalized)   . Syncope and collapse 02/04/2016  . Essential hypertension 07/14/2015  . Cervical spondylolysis 06/18/2014  . Healthcare maintenance 12/10/2013  . Former smoker 12/10/2013  . GERD (gastroesophageal reflux disease) 12/10/2013  . Fatigue 09/09/2013  . Pain, joint, hand, right 05/20/2013  . Leg pain, bilateral 04/22/2012  . Dyshidrotic eczema 12/26/2011  . Back pain 12/05/2010  . ALLERGIC RHINITIS 08/20/2008  . Menorrhagia with regular cycle 03/20/2007  . MIGRAINE HEADACHE 02/20/2007  . Anxiety and depression 01/23/2007    Past Medical History: Past Medical History:  Diagnosis Date  . Anxiety   . Asthma   . Cellulitis   . Chest pain   . Chronic back pain greater than 3 months duration   . Complication of anesthesia    difficult waking up "  . Depression   . Depression   . Eczema   . Hypertension   . Scoliosis     Past Surgical History: Past Surgical History:  Procedure Laterality Date  . TUBAL LIGATION      Social History: Social History   Tobacco Use  . Smoking status: Former Smoker    Years: 14.00    Last attempt to quit: 09/20/2010    Years since quitting: 7.6  . Smokeless tobacco: Never Used  Substance Use Topics  . Alcohol use: Yes    Comment: occasional   . Drug use: No   Additional social history:  Please also refer to relevant sections of EMR.  Family History: Family History  Problem Relation Age of Onset  . Hypertension Mother     Allergies and Medications: Allergies  Allergen Reactions  . Latex Shortness Of Breath    Itching and burns skin  . Amoxicillin Itching and Swelling    Has patient had a PCN reaction causing immediate rash, facial/tongue/throat swelling, SOB or lightheadedness with hypotension: Yes Has patient had a PCN reaction causing severe rash involving mucus membranes or skin necrosis: Yes Has patient had a PCN reaction that required hospitalization: Yes Has patient had a PCN  reaction occurring within the last 10 years: Yes If all of the above answers are "NO", then may proceed with Cephalosporin use.   . Naproxen Hives and Itching    Takes IBU without difficulty  . Tramadol Hives and Itching    Takes IBU without difficulty   No current facility-administered medications on file prior to encounter.    Current Outpatient Medications on File Prior to Encounter  Medication Sig Dispense Refill  . acetaminophen (TYLENOL) 500 MG tablet Take 2 tablets (1,000 mg total) by mouth every 6 (six) hours as needed for mild pain or fever. 30 tablet 0  . amLODipine (NORVASC) 10 MG tablet Take 1 tablet (10 mg total) by mouth daily. 30 tablet 0  . cyclobenzaprine (FLEXERIL) 10 MG tablet Take 1 tablet (10 mg total) by mouth 2 (two) times daily as needed for muscle spasms. 20 tablet 0  . ferrous sulfate 325 (65 FE) MG tablet Take 1 tablet (325 mg total) by mouth 2 (two) times daily with a meal. 60 tablet 3  . HYDROcodone-acetaminophen (NORCO/VICODIN) 5-325 MG tablet Take 1 tablet by mouth every 6 (six) hours as needed for moderate pain or severe pain. 8 tablet 0  . meloxicam (MOBIC) 7.5 MG tablet Take 1 tablet (7.5 mg total) by mouth 2 (two) times daily as needed for pain. Do NOT take on an empty stomach.  Take benadryl for itching (Patient taking differently: Take 7.5 mg by mouth 2 (two) times daily as needed for pain. ) 28 tablet 0  . Multiple Vitamins-Calcium (ONE-A-DAY WOMENS PO) Take 1 tablet by mouth daily.    . predniSONE (STERAPRED UNI-PAK 21 TAB) 10 MG (21) TBPK tablet Take by mouth daily. Take as directed. 21 tablet 0  . benzonatate (TESSALON) 100 MG capsule Take 1 capsule (100 mg total) by mouth every 8 (eight) hours. (Patient not taking: Reported on 05/29/2018) 21 capsule 0  . docusate sodium (COLACE) 100 MG capsule Take 1 capsule (100 mg total) by mouth 2 (two) times daily. (Patient not taking: Reported on 05/29/2018) 60 capsule 3  . hydrochlorothiazide (MICROZIDE) 12.5 MG  capsule Take 1 capsule (12.5 mg total) by mouth daily. (Patient not taking: Reported on 05/29/2018) 30 capsule 0  . ondansetron (ZOFRAN ODT) 8 MG disintegrating tablet Take 1 tablet (8 mg total) by mouth every 8 (eight) hours as needed for nausea or vomiting. (Patient not taking: Reported on 05/29/2018) 12 tablet 0    Objective: BP (!) 171/145   Pulse 90   Temp 98.7 F (37.1 C) (Oral)   Resp 15   Ht 5\' 5"  (1.651 m)   Wt 72.6 kg   LMP 05/02/2018 (Exact Date)   SpO2 100%   BMI 26.63 kg/m  Exam: General: 48 year old  African-American female, uncomfortable in bed Eyes: EOMI, PERRLA.  Pupils not pinpoint at or dilated Cardiovascular: Regular rate and rhythm, no M/R/G.  Palpable peripheral pulses Respiratory: Lungs clear auscultation bilaterally.  No acute distress, no ecchymoses Gastrointestinal: No tenderness to deep palpation in bilateral upper abdomen.  No periumbilical tenderness.  Tenderness to both mild and deep palpation bilateral lower abdomen.  Patient with some increased pain while flexing abdominal muscles.  Minimal guarding, no rebound tenderness. MSK: 5 5 strength bilateral lower extremity, bilateral upper extremity. Derm: Warm, well-perfused Neuro: CN 2-12 intact, no focal neuro deficits  Labs and Imaging: CBC BMET  Recent Labs  Lab 05/29/18 0732 05/29/18 0753  WBC 13.2*  --   HGB 11.7* 13.6  HCT 37.5 40.0  PLT 443*  --    Recent Labs  Lab 05/29/18 0753  NA 134*  K 3.8  CL 104  BUN 5*  CREATININE 0.50  GLUCOSE 125Guadalupe Dawn, MD 05/29/2018, 11:20 AM PGY-2, [facility] pager: [phone], text pages welcome

## 2018-05-29 NOTE — ED Notes (Signed)
Patient is also reporting back pain

## 2018-05-29 NOTE — Progress Notes (Signed)
Received pt from ED. Patient alert and oriented x4. Self ambulated from stretcher to bed. Patient oriented to room, call bell, and bed controls. Will continue to monitor.

## 2018-05-29 NOTE — ED Notes (Signed)
Patient is in CT.

## 2018-05-29 NOTE — Plan of Care (Signed)

## 2018-05-29 NOTE — ED Notes (Signed)
Patient reports that she has had her tubes tide 19 years ago. EDP notified CT notified

## 2018-05-30 ENCOUNTER — Other Ambulatory Visit: Payer: Self-pay

## 2018-05-30 ENCOUNTER — Encounter (HOSPITAL_COMMUNITY): Payer: Self-pay

## 2018-05-30 DIAGNOSIS — I160 Hypertensive urgency: Secondary | ICD-10-CM | POA: Diagnosis not present

## 2018-05-30 DIAGNOSIS — R1030 Lower abdominal pain, unspecified: Secondary | ICD-10-CM | POA: Diagnosis not present

## 2018-05-30 DIAGNOSIS — M5441 Lumbago with sciatica, right side: Secondary | ICD-10-CM | POA: Diagnosis not present

## 2018-05-30 DIAGNOSIS — R2689 Other abnormalities of gait and mobility: Secondary | ICD-10-CM | POA: Diagnosis not present

## 2018-05-30 LAB — CBC WITH DIFFERENTIAL/PLATELET
Abs Immature Granulocytes: 0.04 10*3/uL (ref 0.00–0.07)
Basophils Absolute: 0 10*3/uL (ref 0.0–0.1)
Basophils Relative: 0 %
Eosinophils Absolute: 0 10*3/uL (ref 0.0–0.5)
Eosinophils Relative: 0 %
HCT: 35.4 % — ABNORMAL LOW (ref 36.0–46.0)
Hemoglobin: 11 g/dL — ABNORMAL LOW (ref 12.0–15.0)
Immature Granulocytes: 0 %
Lymphocytes Relative: 15 %
Lymphs Abs: 1.8 10*3/uL (ref 0.7–4.0)
MCH: 22.7 pg — ABNORMAL LOW (ref 26.0–34.0)
MCHC: 31.1 g/dL (ref 30.0–36.0)
MCV: 73.1 fL — AB (ref 80.0–100.0)
MONOS PCT: 8 %
Monocytes Absolute: 0.9 10*3/uL (ref 0.1–1.0)
Neutro Abs: 9.1 10*3/uL — ABNORMAL HIGH (ref 1.7–7.7)
Neutrophils Relative %: 77 %
Platelets: 427 10*3/uL — ABNORMAL HIGH (ref 150–400)
RBC: 4.84 MIL/uL (ref 3.87–5.11)
RDW: 17.8 % — ABNORMAL HIGH (ref 11.5–15.5)
WBC: 12 10*3/uL — ABNORMAL HIGH (ref 4.0–10.5)
nRBC: 0 % (ref 0.0–0.2)

## 2018-05-30 LAB — COMPREHENSIVE METABOLIC PANEL
ALT: 12 U/L (ref 0–44)
AST: 17 U/L (ref 15–41)
Albumin: 3.6 g/dL (ref 3.5–5.0)
Alkaline Phosphatase: 55 U/L (ref 38–126)
Anion gap: 9 (ref 5–15)
BILIRUBIN TOTAL: 0.5 mg/dL (ref 0.3–1.2)
BUN: <5 mg/dL — ABNORMAL LOW (ref 6–20)
CO2: 24 mmol/L (ref 22–32)
Calcium: 9.2 mg/dL (ref 8.9–10.3)
Chloride: 100 mmol/L (ref 98–111)
Creatinine, Ser: 0.7 mg/dL (ref 0.44–1.00)
GFR calc Af Amer: >60 mL/min (ref >60)
GFR calc non Af Amer: >60 mL/min (ref >60)
GLUCOSE: 105 mg/dL — AB (ref 70–99)
Potassium: 3.6 mmol/L (ref 3.5–5.1)
Sodium: 133 mmol/L — ABNORMAL LOW (ref 135–145)
TOTAL PROTEIN: 7.9 g/dL (ref 6.5–8.1)

## 2018-05-30 LAB — TSH: TSH: 1.25 u[IU]/mL (ref 0.350–4.500)

## 2018-05-30 MED ORDER — GABAPENTIN 600 MG PO TABS: 300.0000 mg | ORAL_TABLET | Freq: Three times a day (TID) | ORAL | 0 refills | Status: DC

## 2018-05-30 MED ORDER — IBUPROFEN 800 MG PO TABS: 800.0000 mg | ORAL_TABLET | Freq: Three times a day (TID) | ORAL | 0 refills | Status: DC | PRN

## 2018-05-30 MED ORDER — LIDOCAINE 5 % EX PTCH: 1.0000 | MEDICATED_PATCH | CUTANEOUS | 0 refills | Status: DC

## 2018-05-30 MED ORDER — PANTOPRAZOLE SODIUM 40 MG PO TBEC
40.0000 mg | DELAYED_RELEASE_TABLET | Freq: Every day | ORAL | Status: DC
  Administered 2018-05-30: 40 mg via ORAL
  Filled 2018-05-30: qty 1

## 2018-05-30 MED ORDER — CHLORTHALIDONE 25 MG PO TABS
25.0000 mg | ORAL_TABLET | Freq: Every day | ORAL | Status: DC
  Administered 2018-05-30: 25 mg via ORAL
  Filled 2018-05-30: qty 1

## 2018-05-30 MED ORDER — LORAZEPAM 0.5 MG PO TABS: 0.5000 mg | ORAL_TABLET | Freq: Three times a day (TID) | ORAL | 0 refills | Status: DC

## 2018-05-30 MED ORDER — LORAZEPAM 0.5 MG PO TABS: 0.5000 mg | ORAL_TABLET | Freq: Three times a day (TID) | ORAL | 0 refills | Status: AC | PRN

## 2018-05-30 MED ORDER — CHLORTHALIDONE 25 MG PO TABS: 25.0000 mg | ORAL_TABLET | Freq: Every day | ORAL | 0 refills | Status: DC

## 2018-05-30 MED ORDER — LIDOCAINE 5 % EX PTCH
1.0000 | MEDICATED_PATCH | CUTANEOUS | Status: DC
  Administered 2018-05-30: 1 via TRANSDERMAL
  Filled 2018-05-30: qty 1

## 2018-05-30 MED FILL — GABAPENTIN 300 MG CAPSULE: 300 | 30 days supply | Qty: 90 | Fill #0 | Status: TO

## 2018-05-30 MED FILL — CHLORTHALIDONE 25 MG TABLET: 25 | 30 days supply | Qty: 30 | Fill #0

## 2018-05-30 MED FILL — LORazepam 0.5 MG TABS: 0.5 | 7 days supply | Qty: 21 | Fill #0

## 2018-05-30 NOTE — Progress Notes (Signed)
05/30/18 1105  PT Visit Information  Last PT Received On 05/30/18  Assistance Needed +1  History of Present Illness Patient is a 48 y/o female admitted 1/16 with complaints of increased low back and abdominal pain possibly due to GI ulcer or abdominal wall pathology. Patient has hypertensive urgency and lumbar pain with sciatica. PMH includes HTN, GERD, scoliosis, anxiety, depression, and PTSD.   Precautions  Precautions Fall  Restrictions  Weight Bearing Restrictions No  Home Living  Family/patient expects to be discharged to: Private residence  Living Arrangements Children  Available Help at Discharge Family  Type of [place] Access Level entry  [place] One level  Bathroom Shower/Tub Tub/shower unit;Walk-in shower  Bathroom Toilet Handicapped height  Home Equipment None  Prior Function  Level of Independence Independent  Communication  Communication No difficulties  Pain Assessment  Pain Assessment 0-10  Pain Score 6  Pain Location low back and abdomen  Pain Descriptors / Indicators Aching  Pain Intervention(s) Limited activity within patient's tolerance;Monitored during session;Heat applied  Cognition  Arousal/Alertness Lethargic  Behavior During Therapy WFL for tasks assessed/performed  Overall Cognitive Status Within Functional Limits for tasks assessed  General Comments Patient tired during session  Upper Extremity Assessment  Upper Extremity Assessment Defer to OT evaluation  Lower Extremity Assessment  Lower Extremity Assessment RLE deficits/detail;LLE deficits/detail  RLE Deficits / Details Pain into RLE possibly due to sciatica  Cervical / Trunk Assessment  Cervical / Trunk Assessment Normal  Bed Mobility  Overal bed mobility Needs Assistance  Bed Mobility Supine to Sit;Sit to Supine  Supine to sit Supervision  Sit to supine Supervision  General bed mobility comments supervision for safety. Dizziness reported when sitting EOB that decreased  with seated rest  Transfers  Overall transfer level Needs assistance  Equipment used None  Transfers Sit to/from Stand  Sit to Stand Supervision  General transfer comment Required supervision for safety  Ambulation/Gait  Ambulation/Gait assistance Supervision  Gait Distance (Feet) 100 Feet  Assistive device None  Gait Pattern/deviations Step-through pattern;Decreased step length - right;Decreased step length - left  General Gait Details Patient ambulated with supervision for safety but with no LOB. Patient had decreased gait speed. Patient HR increased to 133bpm during ambulation and patient fatigued requiring return to room. No LOB during dynamic gait tasks  Gait velocity decreased  Balance  Overall balance assessment Needs assistance  Sitting-balance support No upper extremity supported;Feet unsupported  Sitting balance-Leahy Scale Good  Sitting balance - Comments Patient able to put socks on sitting EOB independently   Standing balance support No upper extremity supported  Standing balance-Leahy Scale Good  Standing balance comment ambulated with no AD and no LOB during static and dynamic tasks  Standardized Balance Assessment  Standardized Balance Assessment  Dynamic Gait Index  Dynamic Gait Index  Level Surface 2  Gait with Horizontal Head Turns 2  Gait with Vertical Head Turns 2  General Comments  General comments (skin integrity, edema, etc.) Patient daughter and other family members in room. Patient using heating pad on abdomen for pain reduction.  Patient educated about positioning legs to decrease load on lumbar spine when in supine.   PT - End of Session  Equipment Utilized During Treatment Gait belt  Activity Tolerance Patient limited by fatigue;Patient limited by lethargy;Patient limited by pain  Patient left in bed;with call bell/phone within reach;with family/visitor present  Nurse Communication Mobility status  PT Assessment  PT Recommendation/Assessment Patent  does not need any  further PT services  PT Visit Diagnosis Pain;Other abnormalities of gait and mobility (R26.89)  Pain - Right/Left Right  Pain - part of body Leg  No Skilled PT All education completed;Patient is supervision for all activity/mobility;Patient will have necessary level of assist by caregiver at discharge  AM-PAC PT "6 Clicks" Mobility Outcome Measure (Version 2)  Help needed turning from your back to your side while in a flat bed without using bedrails? 4  Help needed moving from lying on your back to sitting on the side of a flat bed without using bedrails? 4  Help needed moving to and from a bed to a chair (including a wheelchair)? 4  Help needed standing up from a chair using your arms (e.g., wheelchair or bedside chair)? 4  Help needed to walk in hospital room? 4  Help needed climbing 3-5 steps with a railing?  3  6 Click Score 23  Consider Recommendation of Discharge To: Home with no services  PT Recommendation  Follow Up Recommendations No PT follow up  PT equipment None recommended by PT  Acute Rehab PT Goals  Patient Stated Goal for the pain to go away  PT Goal Formulation With patient  Time For Goal Achievement 06/13/18  Potential to Achieve Goals Good  PT Time Calculation  PT Start Time (ACUTE ONLY) 1049  PT Stop Time (ACUTE ONLY) 1102  PT Time Calculation (min) (ACUTE ONLY) 13 min  PT General Charges  $$ ACUTE PT VISIT 1 Visit  PT Evaluation  $PT Eval Low Complexity 1 Low   Patient admitted to hospital with above problems and with deficits below. Patient is supervision for all mobility tasks for safety. Patient had no LOB during static and dynamic gait tasks. HR ranged from low 110s to 133bpm during ambulation. Daughters will be able to assist patient as needed once home.  No further acute PT needs identified. All education has been completed and the patient has no further questions. See below for any follow-up Physical Therapy or equipment needs. PT is  signing off. Thank you for this referral. If needs change, please re consult.   Erick Blinks, SPT

## 2018-05-30 NOTE — Progress Notes (Addendum)
Pt. c/o pain but no pain medication available at this time. Paged MD and received an order for lidocaine patch. Patch was applied to the lower back. Will continue to monitor.

## 2018-05-30 NOTE — Discharge Summary (Signed)
[facility] Discharge Summary  Patient name: Susan Sanders Medical record number: [id number] Date of birth: Dec 03, 1970 Age: 48 y.o. Gender: female Date of Admission: 05/29/2018  Date of Discharge: 05/30/2018 Admitting Physician: Zenia Resides, MD  Primary Care Provider: Bonnita Hollow, MD Consultants: VSS  Indication for Hospitalization: abdominal pain, HTN urgency  Discharge Diagnoses/Problem List:  Abdominal pain Hypertensive urgency Lumbar spine pain with sciatica Hyponatremia Hypocalcemia Leukocytosis GERD  Disposition: discharged home  Discharge Condition: stable, improved  Discharge Exam:  General: NAD, pleasant, able to participate in exam Cardiac: RRR, normal heart sounds, no murmurs. 2+ radial and PT pulses bilaterally Respiratory: CTAB, normal effort, No wheezes, rales or rhonchi Abdomen: soft, nontender, nondistended, no hepatic or splenomegaly, +BS Extremities: no edema or cyanosis. WWP. Skin: warm and dry, no rashes noted Neuro: alert and oriented x4, no focal deficits Psych: Normal affect and mood  Brief Hospital Course:  Patient admitted after traumatic event that lead to her falling and injuring back. Went to ED for treatment of pain. While in ED, found to have elevated blood pressures with systolics in 102V. Patient received hydralazine and home blood pressure medications. Her BP reduced to normal levels when pain was adequately controlled. Pain was treated with tylenol, lidocaine patch, gabapentin, and ativan. On day of discharge, her pain was moderately controlled with only mild suprapubic pain. Abdominal CT was negative for acute diseases. VSS was consulted and signed off as no intervention was required. EKG and TSH were normal. She was able to tolerate a normal diet and ambulate prior to discharge. Hypertension- blood pressure on day of discharge remained elevated to 176/92. Her home HCTZ was switched to 25mg  chlorthalidone and  continued on home amlodipine. She states that prior to admission she was not adherent to her medication regimen. Hyponatremia- patient had Na+ 134>133 while admitted. She did endorse a headache but was otherwise asymptomatic.   Issues for Follow Up:  1. BP management- ensure patient is adherent to amlodipine and chlorthalidone. Could consider adding ARB if not well controlled 2. F/u on sodium levels as was mildly low during admission 3. Patient was sent home with 1 week of ativan per PCP- would f/u and consider another long term treatment for anxiety/PTSD if appropriate. Would also recommend referring to [facility] counseling.  4. Patient will need work note to return on January 25th. 5. Follow up on pain management if discharge medications were adequate.  6. Gastrointestinal panel ordered on admission to pursue cause of abdominal pain and was never collected- could collect if pain still not resolved  Significant Procedures: none  Significant Labs and Imaging:  Recent Labs  Lab 05/29/18 0732 05/29/18 0753 05/30/18 0505  WBC 13.2*  --  12.0*  HGB 11.7* 13.6 11.0*  HCT 37.5 40.0 35.4*  PLT 443*  --  427*   Recent Labs  Lab 05/29/18 0747 05/29/18 0753 05/30/18 0505  NA  --  134* 133*  K  --  3.8 3.6  CL  --  104 100  CO2  --   --  24  GLUCOSE  --  125* 105*  BUN  --  5* <5*  CREATININE  --  0.50 0.70  CALCIUM  --   --  9.2  ALKPHOS 63  --  55  AST 20  --  17  ALT 14  --  12  ALBUMIN 3.9  --  3.6   TSH 1.25 D dimer 1.21  Results/Tests Pending at Time of Discharge:  gastrointestinal panel- never collected  Discharge Medications:  Allergies as of 05/30/2018      Reactions   Latex Shortness Of Breath   Itching and burns skin   Amoxicillin Itching, Swelling   Has patient had a PCN reaction causing immediate rash, facial/tongue/throat swelling, SOB or lightheadedness with hypotension: Yes Has patient had a PCN reaction causing severe rash involving mucus membranes or skin necrosis:  Yes Has patient had a PCN reaction that required hospitalization: Yes Has patient had a PCN reaction occurring within the last 10 years: Yes If all of the above answers are "NO", then may proceed with Cephalosporin use.   Naproxen Hives, Itching   Takes IBU without difficulty   Tramadol Hives, Itching   Takes IBU without difficulty      Medication List    STOP taking these medications   benzonatate 100 MG capsule Commonly known as:  TESSALON   cyclobenzaprine 10 MG tablet Commonly known as:  FLEXERIL   docusate sodium 100 MG capsule Commonly known as:  COLACE   ferrous sulfate 325 (65 FE) MG tablet   hydrochlorothiazide 12.5 MG capsule Commonly known as:  MICROZIDE   HYDROcodone-acetaminophen 5-325 MG tablet Commonly known as:  NORCO/VICODIN   meloxicam 7.5 MG tablet Commonly known as:  MOBIC   ondansetron 8 MG disintegrating tablet Commonly known as:  ZOFRAN ODT   predniSONE 10 MG (21) Tbpk tablet Commonly known as:  STERAPRED UNI-PAK 21 TAB     TAKE these medications   acetaminophen 500 MG tablet Commonly known as:  TYLENOL Take 2 tablets (1,000 mg total) by mouth every 6 (six) hours as needed for mild pain or fever.   amLODipine 10 MG tablet Commonly known as:  NORVASC Take 1 tablet (10 mg total) by mouth daily.   chlorthalidone 25 MG tablet Commonly known as:  HYGROTON Take 1 tablet (25 mg total) by mouth daily. Start taking on:  May 31, 2018   gabapentin 600 MG tablet Commonly known as:  NEURONTIN Take 0.5 tablets (300 mg total) by mouth 3 (three) times daily.   ibuprofen 800 MG tablet Commonly known as:  ADVIL,MOTRIN Take 1 tablet (800 mg total) by mouth every 8 (eight) hours as needed.   lidocaine 5 % Commonly known as:  LIDODERM Place 1 patch onto the skin daily. Remove & Discard patch within 12 hours or as directed by MD Start taking on:  May 31, 2018   LORazepam 0.5 MG tablet Commonly known as:  ATIVAN Take 1 tablet (0.5 mg total)  by mouth every 8 (eight) hours as needed for up to 7 days for anxiety.   ONE-A-DAY WOMENS PO Take 1 tablet by mouth daily.       Discharge Instructions: Please refer to Patient Instructions section of EMR for full details.  Patient was counseled important signs and symptoms that should prompt return to medical care, changes in medications, dietary instructions, activity restrictions, and follow up appointments.   Follow-Up Appointments: Follow-up Information    Shirley, [place], DO. Go on 06/11/2018.   Specialty:  Family Medicine Why:  @8 :50AM (please arrive by 8:30AM) Contact information: [address] [address] [place], [place], DO 05/30/2018, 12:18 PM PGY-1, [place]

## 2018-05-30 NOTE — Evaluation (Addendum)
Occupational Therapy Evaluation Patient Details Name: Susan Sanders MRN: [id number] DOB: [birth year] Today's Date: 05/30/2018    History of Present Illness Patient is a 48 y/o female admitted 1/16 with complaints of increased low back and abdominal pain possibly due to GI ulcer or abdominal wall pathology. Patient has hypertensive urgency and lumbar pain with sciatica with PMH including HTN, GERD, scoliosis, anxiety, depression, and PTSD.    Clinical Impression   Pt is a 48 yo female s/p above dx and fall x2 weeks ago. Pt PTA: independent with ADL and mobility with pain x2 weeks; cares for grandchildren +2. Pt currently, limited by dull pain in BLEs and moderate pain in abdomen that decreases with heating pad applied. Pt performing bed mobility with supervisionA and reviewed log rolling technique for ease of back pain. Pt performing ADLs with increased time and Mod I; and compensatory or adaptive ways for ADLs figure 4 pattern performed for LB dressing. Pt performing mobility in room with fair balance and no AD. Pt at functional baseline and does not require additional acute nor post acute OT services.   HR as high as 145-160; mostly 145 with exertion. Pt not symptomatic, but rested in chair before continuing therapy.  Follow Up Recommendations  No OT follow up    Equipment Recommendations  Tub/shower seat    Recommendations for Other Services       Precautions / Restrictions Precautions Precautions: Fall Restrictions Weight Bearing Restrictions: No      Mobility Bed Mobility Overal bed mobility: Needs Assistance Bed Mobility: Supine to Sit;Sit to Supine     Supine to sit: Supervision Sit to supine: Supervision   General bed mobility comments: supervision for safety. Dizziness reported when sitting EOB that decreased with duration.   Transfers Overall transfer level: Needs assistance Equipment used: None Transfers: Sit to/from Stand Sit to Stand: Supervision          General transfer comment: Required supervision for safety    Balance Overall balance assessment: Needs assistance Sitting-balance support: No upper extremity supported;Feet unsupported Sitting balance-Leahy Scale: Good Sitting balance - Comments: Patient able to put socks on sitting EOB independently    Standing balance support: No upper extremity supported Standing balance-Leahy Scale: Good Standing balance comment: ambulated with no AD and no LOB during static and dynamic tasks                 Standardized Balance Assessment Standardized Balance Assessment : Dynamic Gait Index   Dynamic Gait Index Level Surface: Mild Impairment Gait with Horizontal Head Turns: Mild Impairment Gait with Vertical Head Turns: Mild Impairment     ADL either performed or assessed with clinical judgement   ADL Overall ADL's : At baseline                                       General ADL Comments: Pt advised to use figure 4 technique for LB dressing/bathing and BSC in shower when painful. Pt Mod I for ADLs and requires increased time.     Vision Baseline Vision/History: No visual deficits       Perception     Praxis      Pertinent Vitals/Pain Pain Assessment: 0-10 Pain Score: 4  Pain Location: low back and abdomen Pain Descriptors / Indicators: Aching Pain Intervention(s): Limited activity within patient's tolerance;Heat applied     Hand Dominance Right   Extremity/Trunk Assessment Upper Extremity  Assessment Upper Extremity Assessment: Overall WFL for tasks assessed   Lower Extremity Assessment Lower Extremity Assessment: Generalized weakness;Defer to PT evaluation RLE Deficits / Details: Pain into RLE possibly due to sciatica   Cervical / Trunk Assessment Cervical / Trunk Assessment: Normal   Communication Communication Communication: No difficulties   Cognition Arousal/Alertness: Lethargic Behavior During Therapy: WFL for tasks  assessed/performed Overall Cognitive Status: Within Functional Limits for tasks assessed                                 General Comments: Patient tired during session   General Comments  Patient daughter and other family members in room. Patient using heating pad on abdomen for pain reduction.  Patient educated about positioning legs to decrease load on lumbar spine when in supine.     Exercises     Shoulder Instructions      Home Living Family/patient expects to be discharged to:: Private residence Living Arrangements: Children Available Help at Discharge: Family Type of Home: Apartment Home Access: Level entry     Home Layout: One level     Bathroom Shower/Tub: Tub/shower unit;Walk-in shower   Bathroom Toilet: Handicapped height     Home Equipment: Other (comment)(has access to [facility])          Prior Functioning/Environment Level of Independence: Independent                 OT Problem List: Decreased activity tolerance      OT Treatment/Interventions:      OT Goals(Current goals can be found in the care plan section) Acute Rehab OT Goals Patient Stated Goal: for the pain to go away  OT Frequency:     Barriers to D/C:            Co-evaluation              AM-PAC OT "6 Clicks" Daily Activity     Outcome Measure Help from another person eating meals?: None Help from another person taking care of personal grooming?: None Help from another person toileting, which includes using toliet, bedpan, or urinal?: None Help from another person bathing (including washing, rinsing, drying)?: None Help from another person to put on and taking off regular upper body clothing?: None Help from another person to put on and taking off regular lower body clothing?: None 6 Click Score: 24   End of Session Equipment Utilized During Treatment: Gait belt Nurse Communication: Mobility status  Activity Tolerance: Patient tolerated treatment well;Patient  limited by pain Patient left: in chair;with call bell/phone within reach;with family/visitor present  OT Visit Diagnosis: Unsteadiness on feet (R26.81);Muscle weakness (generalized) (M62.81)                Time: 1210-1230 OT Time Calculation (min): 20 min Charges:  OT General Charges $OT Visit: 1 Visit OT Evaluation $OT Eval Moderate Complexity: 1 Mod  Darryl Nestle) Marsa Aris OTR/L Acute Rehabilitation Services Pager: [phone] Office: [phone]   Fredda Hammed 05/30/2018, 12:35 PM

## 2018-05-30 NOTE — Progress Notes (Signed)
Pt discharged home with family, left via w/c with staff to front of building .  [facility]

## 2018-06-01 ENCOUNTER — Emergency Department (HOSPITAL_COMMUNITY)
Admission: EM | Admit: 2018-06-01 | Discharge: 2018-06-01 | Disposition: A | Discharge status: Home / Self Care | Payer: BLUE CROSS/BLUE SHIELD | Attending: Emergency Medicine | Admitting: Emergency Medicine

## 2018-06-01 ENCOUNTER — Emergency Department (HOSPITAL_COMMUNITY): Payer: BLUE CROSS/BLUE SHIELD

## 2018-06-01 ENCOUNTER — Other Ambulatory Visit: Payer: Self-pay

## 2018-06-01 ENCOUNTER — Encounter (HOSPITAL_COMMUNITY): Payer: Self-pay | Admitting: Emergency Medicine

## 2018-06-01 ENCOUNTER — Telehealth: Payer: Self-pay | Admitting: Family Medicine

## 2018-06-01 DIAGNOSIS — Z87891 Personal history of nicotine dependence: Secondary | ICD-10-CM | POA: Diagnosis not present

## 2018-06-01 DIAGNOSIS — Z79899 Other long term (current) drug therapy: Secondary | ICD-10-CM | POA: Insufficient documentation

## 2018-06-01 DIAGNOSIS — I10 Essential (primary) hypertension: Secondary | ICD-10-CM | POA: Insufficient documentation

## 2018-06-01 DIAGNOSIS — Z91040 Latex allergy status: Secondary | ICD-10-CM | POA: Insufficient documentation

## 2018-06-01 DIAGNOSIS — R1010 Upper abdominal pain, unspecified: Secondary | ICD-10-CM | POA: Diagnosis present

## 2018-06-01 DIAGNOSIS — R1084 Generalized abdominal pain: Secondary | ICD-10-CM | POA: Insufficient documentation

## 2018-06-01 DIAGNOSIS — K5900 Constipation, unspecified: Secondary | ICD-10-CM

## 2018-06-01 DIAGNOSIS — D259 Leiomyoma of uterus, unspecified: Secondary | ICD-10-CM | POA: Diagnosis not present

## 2018-06-01 DIAGNOSIS — D219 Benign neoplasm of connective and other soft tissue, unspecified: Secondary | ICD-10-CM | POA: Diagnosis not present

## 2018-06-01 LAB — COMPREHENSIVE METABOLIC PANEL
ALT: 15 U/L (ref 0–44)
AST: 20 U/L (ref 15–41)
Albumin: 3.9 g/dL (ref 3.5–5.0)
Alkaline Phosphatase: 63 U/L (ref 38–126)
Anion gap: 15 (ref 5–15)
BUN: 10 mg/dL (ref 6–20)
CO2: 22 mmol/L (ref 22–32)
Calcium: 9.8 mg/dL (ref 8.9–10.3)
Chloride: 95 mmol/L — ABNORMAL LOW (ref 98–111)
Creatinine, Ser: 0.82 mg/dL (ref 0.44–1.00)
GFR calc Af Amer: >60 mL/min (ref >60)
GFR calc non Af Amer: >60 mL/min (ref >60)
Glucose, Bld: 117 mg/dL — ABNORMAL HIGH (ref 70–99)
POTASSIUM: 3.7 mmol/L (ref 3.5–5.1)
SODIUM: 132 mmol/L — AB (ref 135–145)
Total Bilirubin: 0.7 mg/dL (ref 0.3–1.2)
Total Protein: 8.6 g/dL — ABNORMAL HIGH (ref 6.5–8.1)

## 2018-06-01 LAB — CBC WITH DIFFERENTIAL/PLATELET
Abs Immature Granulocytes: 0.05 10*3/uL (ref 0.00–0.07)
Basophils Absolute: 0.1 10*3/uL (ref 0.0–0.1)
Basophils Relative: 0 %
Eosinophils Absolute: 0.1 10*3/uL (ref 0.0–0.5)
Eosinophils Relative: 0 %
HCT: 38.2 % (ref 36.0–46.0)
Hemoglobin: 12.1 g/dL (ref 12.0–15.0)
Immature Granulocytes: 0 %
Lymphocytes Relative: 21 %
Lymphs Abs: 2.6 10*3/uL (ref 0.7–4.0)
MCH: 23.6 pg — ABNORMAL LOW (ref 26.0–34.0)
MCHC: 31.7 g/dL (ref 30.0–36.0)
MCV: 74.6 fL — ABNORMAL LOW (ref 80.0–100.0)
Monocytes Absolute: 1 10*3/uL (ref 0.1–1.0)
Monocytes Relative: 9 %
NEUTROS PCT: 70 %
Neutro Abs: 8.3 10*3/uL — ABNORMAL HIGH (ref 1.7–7.7)
Platelets: 549 10*3/uL — ABNORMAL HIGH (ref 150–400)
RBC: 5.12 MIL/uL — ABNORMAL HIGH (ref 3.87–5.11)
RDW: 17.8 % — AB (ref 11.5–15.5)
WBC: 12.1 10*3/uL — ABNORMAL HIGH (ref 4.0–10.5)
nRBC: 0 % (ref 0.0–0.2)

## 2018-06-01 LAB — URINALYSIS, ROUTINE W REFLEX MICROSCOPIC
BILIRUBIN URINE: NEGATIVE
Glucose, UA: NEGATIVE mg/dL
Ketones, ur: NEGATIVE mg/dL
LEUKOCYTES UA: NEGATIVE
Nitrite: NEGATIVE
Protein, ur: NEGATIVE mg/dL
Specific Gravity, Urine: 1.008 (ref 1.005–1.030)
pH: 5 (ref 5.0–8.0)

## 2018-06-01 LAB — RAPID URINE DRUG SCREEN, HOSP PERFORMED
Amphetamines: NOT DETECTED
Barbiturates: NOT DETECTED
Benzodiazepines: NOT DETECTED
Cocaine: NOT DETECTED
Opiates: POSITIVE — AB
Tetrahydrocannabinol: POSITIVE — AB

## 2018-06-01 MED ORDER — MORPHINE SULFATE (PF) 4 MG/ML IV SOLN
4.0000 mg | Freq: Once | INTRAVENOUS | Status: AC
  Administered 2018-06-01: 4 mg via INTRAVENOUS
  Filled 2018-06-01: qty 1

## 2018-06-01 MED ORDER — SODIUM CHLORIDE 0.9 % IV BOLUS
1000.0000 mL | Freq: Once | INTRAVENOUS | Status: AC
  Administered 2018-06-01: 1000 mL via INTRAVENOUS

## 2018-06-01 MED ORDER — ONDANSETRON HCL 4 MG/2ML IJ SOLN
4.0000 mg | Freq: Once | INTRAMUSCULAR | Status: AC
  Administered 2018-06-01: 4 mg via INTRAVENOUS
  Filled 2018-06-01: qty 2

## 2018-06-01 MED ORDER — POLYETHYLENE GLYCOL 3350 17 G PO PACK: 17.0000 g | PACK | Freq: Every day | ORAL | 0 refills | Status: DC

## 2018-06-01 NOTE — ED Provider Notes (Signed)
[facility] EMERGENCY DEPARTMENT Provider Note   CSN: [id number] Arrival date & time: 06/01/18  1115     History   Chief Complaint Chief Complaint  Patient presents with  . Abdominal Pain    HPI Susan Sanders is a 48 y.o. female.  The history is provided by the patient. No language interpreter was used.  Abdominal Pain  Pain location:  Suprapubic Pain quality: aching and bloating   Pain severity:  Severe Onset quality:  Gradual Timing:  Constant Progression:  Worsening Chronicity:  New Relieved by:  Nothing Worsened by:  Nothing Ineffective treatments:  None tried Associated symptoms: constipation   Associated symptoms: no chest pain, no shortness of breath and no sore throat   Pt reports lower abdominal pain.  Pt also feels like she is constipated   Past Medical History:  Diagnosis Date  . Anemia   . Anxiety   . Cellulitis   . Chest pain   . Childhood asthma   . Chronic back pain    "all over" (05/29/2018)  . Complication of anesthesia    "difficult waking up " (03/30/2019)  . Depression   . Eczema   . Hypertension   . Scoliosis     Patient Active Problem List   Diagnosis Date Noted  . Abdominal pain 05/29/2018  . Overweight 11/02/2016  . Muscle weakness (generalized)   . Syncope and collapse 02/04/2016  . Essential hypertension 07/14/2015  . Cervical spondylolysis 06/18/2014  . Healthcare maintenance 12/10/2013  . Former smoker 12/10/2013  . GERD (gastroesophageal reflux disease) 12/10/2013  . Fatigue 09/09/2013  . Pain, joint, hand, right 05/20/2013  . Leg pain, bilateral 04/22/2012  . Dyshidrotic eczema 12/26/2011  . Back pain 12/05/2010  . ALLERGIC RHINITIS 08/20/2008  . Menorrhagia with regular cycle 03/20/2007  . MIGRAINE HEADACHE 02/20/2007  . Anxiety and depression 01/23/2007    Past Surgical History:  Procedure Laterality Date  . TUBAL LIGATION       OB History    Gravida  7   Para  6   Term  4   Preterm    2   AB  1   Living  6     SAB      TAB      Ectopic  1   Multiple      Live Births               Home Medications    Prior to Admission medications   Medication Sig Start Date End Date Taking? Authorizing Provider  acetaminophen (TYLENOL) 500 MG tablet Take 2 tablets (1,000 mg total) by mouth every 6 (six) hours as needed for mild pain or fever. 06/06/16  Yes Leo Grosser, MD  amLODipine (NORVASC) 10 MG tablet Take 1 tablet (10 mg total) by mouth daily. 07/24/17 06/01/18 Yes Smiley Houseman, MD  chlorthalidone (HYGROTON) 25 MG tablet Take 1 tablet (25 mg total) by mouth daily. 05/31/18  Yes Anderson, Chelsey L, DO  gabapentin (NEURONTIN) 600 MG tablet Take 0.5 tablets (300 mg total) by mouth 3 (three) times daily. 05/30/18  Yes Anderson, Chelsey L, DO  ibuprofen (ADVIL,MOTRIN) 800 MG tablet Take 1 tablet (800 mg total) by mouth every 8 (eight) hours as needed. Patient taking differently: Take 800 mg by mouth every 8 (eight) hours as needed for moderate pain.  05/30/18  Yes Anderson, Chelsey L, DO  LORazepam (ATIVAN) 0.5 MG tablet Take 1 tablet (0.5 mg total) by mouth every 8 (  eight) hours as needed for up to 7 days for anxiety. 05/30/18 06/06/18 Yes Anderson, Chelsey L, DO  Multiple Vitamins-Calcium (ONE-A-DAY WOMENS PO) Take 1 tablet by mouth daily.   Yes [provider]  lidocaine (LIDODERM) 5 % Place 1 patch onto the skin daily. Remove & Discard patch within 12 hours or as directed by MD 05/31/18   Richarda Osmond, DO    Family History Family History  Problem Relation Age of Onset  . Hypertension Mother     Social History Social History   Tobacco Use  . Smoking status: Former Smoker    Packs/day: 0.50    Years: 28.00    Pack years: 14.00    Types: Cigarettes    Last attempt to quit: 09/20/2010    Years since quitting: 7.7  . Smokeless tobacco: Never Used  Substance Use Topics  . Alcohol use: Yes    Alcohol/week: 2.0 standard drinks    Types: 2  Cans of beer per week  . Drug use: No     Allergies   Latex; Amoxicillin; Naproxen; and Tramadol   Review of Systems Review of Systems  HENT: Negative for sore throat.   Respiratory: Negative for shortness of breath.   Cardiovascular: Negative for chest pain.  Gastrointestinal: Positive for abdominal pain and constipation.  All other systems reviewed and are negative.    Physical Exam Updated Vital Signs BP (!) 188/95   Pulse 90   Temp 98.4 F (36.9 C) (Oral)   Resp 10   LMP 05/02/2018 (Exact Date)   SpO2 100%   Physical Exam Vitals signs and nursing note reviewed.  Constitutional:      Appearance: She is well-developed.  HENT:     Head: Normocephalic.     Mouth/Throat:     Mouth: Mucous membranes are moist.  Eyes:     Extraocular Movements: Extraocular movements intact.  Cardiovascular:     Rate and Rhythm: Normal rate and regular rhythm.  Pulmonary:     Effort: Pulmonary effort is normal.     Breath sounds: Normal breath sounds.  Abdominal:     General: Abdomen is flat. Bowel sounds are normal.     Palpations: Abdomen is soft.  Genitourinary:    Adnexa: Right adnexa normal.  Skin:    General: Skin is warm.  Neurological:     General: No focal deficit present.     Mental Status: She is alert.  Psychiatric:        Mood and Affect: Mood normal.      ED Treatments / Results  Labs (all labs ordered are listed, but only abnormal results are displayed) Labs Reviewed  CBC WITH DIFFERENTIAL/PLATELET - Abnormal; Notable for the following components:      Result Value   WBC 12.1 (*)    RBC 5.12 (*)    MCV 74.6 (*)    MCH 23.6 (*)    RDW 17.8 (*)    Platelets 549 (*)    Neutro Abs 8.3 (*)    All other components within normal limits  COMPREHENSIVE METABOLIC PANEL - Abnormal; Notable for the following components:   Sodium 132 (*)    Chloride 95 (*)    Glucose, Bld 117 (*)    Total Protein 8.6 (*)    All other components within normal limits    URINALYSIS, ROUTINE W REFLEX MICROSCOPIC  RAPID URINE DRUG SCREEN, HOSP PERFORMED    EKG None  Radiology US Pelvis Transvanginal Non-ob (tv Only)  Result  Date: 06/01/2018 CLINICAL DATA:  48 year old female with pelvic pain. Fibroid uterus. EXAM: TRANSABDOMINAL AND TRANSVAGINAL ULTRASOUND OF PELVIS TECHNIQUE: Both transabdominal and transvaginal ultrasound examinations of the pelvis were performed. Transabdominal technique was performed for global imaging of the pelvis including uterus, ovaries, adnexal regions, and pelvic cul-de-sac. It was necessary to proceed with endovaginal exam following the transabdominal exam to visualize the ovaries. COMPARISON:  CTA chest abdomen and pelvis 05/29/2018. Pelvis ultrasound 07/31/2017. FINDINGS: Uterus Measurements: 14.4 x 7.5 x 8.2 centimeters = volume: 469 mL. Multiple uterine fibroids ranging from 1.2-6.8 centimeters diameter (e.g. Image 15). The largest is in the posterior lower uterine segment. Endometrium Thickness: 2 millimeters. Mass effect on the endometrium by the largest fibroids (image 27). Right ovary Measurements: Could not be identified despite transabdominal and transvaginal attempts. Left ovary Measurements: Could not be identified despite transabdominal and transvaginal attempts. On the recent CTA both ovaries appear to be located relatively high in the pelvis/lower abdomen. Other findings No pelvic free fluid. IMPRESSION: 1. Extensive fibroid uterus. Individual fibroids up to 6.8 cm diameter are intramural and submucosal. 2. Normal endometrial thickness. Mass effect on the endometrium by the largest fibroids. 3. Neither ovary could be identified by ultrasound. 4. No pelvic free fluid. Electronically Signed   By: Genevie Ann M.D.   On: 06/01/2018 13:17   US Pelvis Limited (transabdominal Only)  Result Date: 06/01/2018 CLINICAL DATA:  48 year old female with pelvic pain. Fibroid uterus. EXAM: TRANSABDOMINAL AND TRANSVAGINAL ULTRASOUND OF PELVIS  TECHNIQUE: Both transabdominal and transvaginal ultrasound examinations of the pelvis were performed. Transabdominal technique was performed for global imaging of the pelvis including uterus, ovaries, adnexal regions, and pelvic cul-de-sac. It was necessary to proceed with endovaginal exam following the transabdominal exam to visualize the ovaries. COMPARISON:  CTA chest abdomen and pelvis 05/29/2018. Pelvis ultrasound 07/31/2017. FINDINGS: Uterus Measurements: 14.4 x 7.5 x 8.2 centimeters = volume: 469 mL. Multiple uterine fibroids ranging from 1.2-6.8 centimeters diameter (e.g. Image 15). The largest is in the posterior lower uterine segment. Endometrium Thickness: 2 millimeters. Mass effect on the endometrium by the largest fibroids (image 27). Right ovary Measurements: Could not be identified despite transabdominal and transvaginal attempts. Left ovary Measurements: Could not be identified despite transabdominal and transvaginal attempts. On the recent CTA both ovaries appear to be located relatively high in the pelvis/lower abdomen. Other findings No pelvic free fluid. IMPRESSION: 1. Extensive fibroid uterus. Individual fibroids up to 6.8 cm diameter are intramural and submucosal. 2. Normal endometrial thickness. Mass effect on the endometrium by the largest fibroids. 3. Neither ovary could be identified by ultrasound. 4. No pelvic free fluid. Electronically Signed   By: Genevie Ann M.D.   On: 06/01/2018 13:17    Procedures Procedures (including critical care time)  Medications Ordered in ED Medications  sodium chloride 0.9 % bolus 1,000 mL (1,000 mLs Intravenous New Bag/Given 06/01/18 1153)  morphine 4 MG/ML injection 4 mg (4 mg Intravenous Given 06/01/18 1147)  ondansetron (ZOFRAN) injection 4 mg (4 mg Intravenous Given 06/01/18 1147)     Initial Impression / Assessment and Plan / ED Course  I have reviewed the triage vital signs and the nursing notes.  Pertinent labs & imaging results that were  available during my care of the patient were reviewed by me and considered in my medical decision making (see chart for details).     MDM  Pt given IV Ns, Morphine and zofran   Final Clinical Impressions(s) / ED Diagnoses   Final diagnoses:  Constipation,  unspecified constipation type  Leiomyoma  Generalized abdominal pain    ED Discharge Orders         Ordered    polyethylene glycol (MIRALAX) packet  Daily     06/01/18 1533        An After Visit Summary was printed and given to the patient.    Fransico Meadow, [place] 06/01/18 1534    Quintella Reichert, MD 06/02/18 [phone]

## 2018-06-01 NOTE — Telephone Encounter (Signed)
**  After Hours/ Emergency Line Call**  Received a call to report that Susan Sanders continued pain since discharge.  Endorsing stomach pain and legs. Has been using ibuprofen. Patient very tearful on phone.  Denying nausea, vomiting, or diarrhea. Unable to access fever. Patient very difficult to understand on the phone as she is very tearful and states she is in pain. Recommended that patient come to emergency department as clinic is closed and difficult to assess pain over the phone. Recommended that patient needs to see a physician to assess pain. States she will not need to call EMS as her daughter will be able to drive her to ED.  Red flags discussed.  Will forward to PCP.  Caroline More, DO PGY-2, [facility] 06/01/2018 10:45 AM

## 2018-06-01 NOTE — ED Triage Notes (Signed)
Patient crying during triage stating she is hurting all over. C/o back pain, abdominal pain, leg pain, weakness. Daughter and mother at bedside stating patient has not been able to take medications. Did not take blood pressure meds today.

## 2018-06-01 NOTE — ED Notes (Signed)
Patient transported to Ultrasound 

## 2018-06-01 NOTE — Discharge Instructions (Signed)
Return if any problems.

## 2018-06-03 ENCOUNTER — Telehealth: Payer: Self-pay | Admitting: Family Medicine

## 2018-06-03 ENCOUNTER — Ambulatory Visit (INDEPENDENT_AMBULATORY_CARE_PROVIDER_SITE_OTHER): Payer: BLUE CROSS/BLUE SHIELD | Admitting: Family Medicine

## 2018-06-03 ENCOUNTER — Encounter: Payer: Self-pay | Admitting: Family Medicine

## 2018-06-03 ENCOUNTER — Other Ambulatory Visit: Payer: Self-pay

## 2018-06-03 VITALS — BP 132/86 | HR 80 | Temp 98.9°F | Wt 185.0 lb

## 2018-06-03 DIAGNOSIS — I10 Essential (primary) hypertension: Secondary | ICD-10-CM

## 2018-06-03 DIAGNOSIS — F411 Generalized anxiety disorder: Secondary | ICD-10-CM | POA: Diagnosis not present

## 2018-06-03 DIAGNOSIS — R1032 Left lower quadrant pain: Secondary | ICD-10-CM

## 2018-06-03 DIAGNOSIS — E871 Hypo-osmolality and hyponatremia: Secondary | ICD-10-CM | POA: Diagnosis not present

## 2018-06-03 DIAGNOSIS — D259 Leiomyoma of uterus, unspecified: Secondary | ICD-10-CM | POA: Diagnosis not present

## 2018-06-03 MED ORDER — DULOXETINE HCL 20 MG PO CPEP: 20.0000 mg | ORAL_CAPSULE | Freq: Every day | ORAL | 0 refills | Status: DC

## 2018-06-03 NOTE — Patient Instructions (Signed)

## 2018-06-03 NOTE — Assessment & Plan Note (Signed)
Likely due to constipation, may have a mood component as well.  Improved with ongoing MiraLAX treatment.  Continue MiraLAX.

## 2018-06-03 NOTE — Progress Notes (Signed)
Established Patient Office Visit  Subjective:  Patient ID: Susan Sanders, female    DOB: 16-Jul-1970  Age: 49 y.o. MRN: [id number]  CC:  Chief Complaint  Patient presents with  . Abdominal Pain    HPI Susan Sanders presents for onging abdominal pain and hospital follow-up for hypertensive emergency.  Hospital follow-up  Patient reports that she has had adherence to her blood pressure medications.  During hospitalization she had ongoing hyponatremia, I recommended that she get sodium rechecked.  She denies any muscle cramps, weakness, dizziness..  She has been eating and drinking and tolerating p.o. well.    Abdominal pain She is had ongoing abdominal pain, but this is improved since she was seen in the hospital and the ED.  Patient was diagnosed constipation has been taking MiraLAX and has had improvement with her bowel movements.  They are no longer hard and firm.  She has been on this morning.  Denies blood in her stool.  Denies nausea, vomiting.  Abdominal pain is left lower quadrant.  Does not radiate anywhere.  Patient also has history of chronic back pain, however this is also improved with bowel movements.  Patient pressure trending improve her diet by eating creams.  Anxiety/PTSD Patient has had ongoing sleep disturbances.  She was placed on a trial of Ativan.  This was for 1 week.  She is taking about 0.5 mg a day as needed.  She says it does improve her sleep.  She has had this is also helped her with her relationship with her daughter.  She is interested in something to help her mood.  We discussed discontinuing this medication and trialing another thing for her ongoing anxiety.  Patient is willing to try Cymbalta.  Discussed with patient need to continue to take medication in her system.  Patient understands the plan to adhere the medication.  Uterine fibroids Patient has large uterine fibroids seen on ultrasound.  She is wanting to have evaluation by GYN for possible surgical  removal.  GAD 7 : Generalized Anxiety Score 06/03/2018  Nervous, Anxious, on Edge 3  Control/stop worrying 3  Worry too much - different things 1  Trouble relaxing 2  Restless 3  Easily annoyed or irritable 3  Afraid - awful might happen 3  Total GAD 7 Score 18  Anxiety Difficulty Somewhat difficult    Past Medical History:  Diagnosis Date  . Anemia   . Anxiety   . Cellulitis   . Chest pain   . Childhood asthma   . Chronic back pain    "all over" (05/29/2018)  . Complication of anesthesia    "difficult waking up " (03/30/2019)  . Depression   . Eczema   . Hypertension   . Scoliosis     Past Surgical History:  Procedure Laterality Date  . TUBAL LIGATION      Family History  Problem Relation Age of Onset  . Hypertension Mother     Social History   Socioeconomic History  . Marital status: Divorced    Spouse name: Not on file  . Number of children: Not on file  . Years of education: Not on file  . Highest education level: Not on file  Occupational History  . Not on file  Social Needs  . Financial resource strain: Not on file  . Food insecurity:    Worry: Not on file    Inability: Not on file  . Transportation needs:    Medical: Not on file  Non-medical: Not on file  Tobacco Use  . Smoking status: Former Smoker    Packs/day: 0.50    Years: 28.00    Pack years: 14.00    Types: Cigarettes    Last attempt to quit: 09/20/2010    Years since quitting: 7.7  . Smokeless tobacco: Never Used  Substance and Sexual Activity  . Alcohol use: Yes    Alcohol/week: 2.0 standard drinks    Types: 2 Cans of beer per week  . Drug use: No  . Sexual activity: Not on file  Lifestyle  . Physical activity:    Days per week: Not on file    Minutes per session: Not on file  . Stress: Not on file  Relationships  . Social connections:    Talks on phone: Not on file    Gets together: Not on file    Attends religious service: Not on file    Active member of club or  organization: Not on file    Attends meetings of clubs or organizations: Not on file    Relationship status: Not on file  . Intimate partner violence:    Fear of current or ex partner: Not on file    Emotionally abused: Not on file    Physically abused: Not on file    Forced sexual activity: Not on file  Other Topics Concern  . Not on file  Social History Narrative  . Not on file    Outpatient Medications Prior to Visit  Medication Sig Dispense Refill  . acetaminophen (TYLENOL) 500 MG tablet Take 2 tablets (1,000 mg total) by mouth every 6 (six) hours as needed for mild pain or fever. 30 tablet 0  . amLODipine (NORVASC) 10 MG tablet Take 1 tablet (10 mg total) by mouth daily. 30 tablet 0  . chlorthalidone (HYGROTON) 25 MG tablet Take 1 tablet (25 mg total) by mouth daily. 30 tablet 0  . gabapentin (NEURONTIN) 600 MG tablet Take 0.5 tablets (300 mg total) by mouth 3 (three) times daily. 60 tablet 0  . ibuprofen (ADVIL,MOTRIN) 800 MG tablet Take 1 tablet (800 mg total) by mouth every 8 (eight) hours as needed. (Patient taking differently: Take 800 mg by mouth every 8 (eight) hours as needed for moderate pain. ) 10 tablet 0  . lidocaine (LIDODERM) 5 % Place 1 patch onto the skin daily. Remove & Discard patch within 12 hours or as directed by MD 30 patch 0  . LORazepam (ATIVAN) 0.5 MG tablet Take 1 tablet (0.5 mg total) by mouth every 8 (eight) hours as needed for up to 7 days for anxiety. 21 tablet 0  . Multiple Vitamins-Calcium (ONE-A-DAY WOMENS PO) Take 1 tablet by mouth daily.    . polyethylene glycol (MIRALAX) packet Take 17 g by mouth daily. 14 each 0   No facility-administered medications prior to visit.     Allergies  Allergen Reactions  . Latex Shortness Of Breath    Itching and burns skin  . Amoxicillin Itching and Swelling    Has patient had a PCN reaction causing immediate rash, facial/tongue/throat swelling, SOB or lightheadedness with hypotension: Yes Has patient had a  PCN reaction causing severe rash involving mucus membranes or skin necrosis: Yes Has patient had a PCN reaction that required hospitalization: Yes Has patient had a PCN reaction occurring within the last 10 years: Yes If all of the above answers are "NO", then may proceed with Cephalosporin use.   . Naproxen Hives and Itching  Takes IBU without difficulty  . Tramadol Hives and Itching    Takes IBU without difficulty    ROS Review of Systems  Constitutional: Negative for fever.  Respiratory: Negative for chest tightness and shortness of breath.   Cardiovascular: Negative for chest pain.  Gastrointestinal: Positive for abdominal pain and constipation. Negative for blood in stool, diarrhea, nausea, rectal pain and vomiting.  Genitourinary: Negative for dysuria, flank pain and urgency.  Neurological: Negative for dizziness, weakness and numbness.  Psychiatric/Behavioral: Positive for sleep disturbance. Negative for suicidal ideas. The patient is nervous/anxious.   All other systems reviewed and are negative.     Objective:    Physical Exam  Constitutional: She appears well-developed. No distress.  HENT:  Head: Normocephalic and atraumatic.  Eyes: Right eye exhibits no discharge. Left eye exhibits no discharge. No scleral icterus.  Neck: No JVD present.  Cardiovascular: Normal rate.  Pulmonary/Chest: No respiratory distress.  Abdominal: Soft. She exhibits no distension. There is no abdominal tenderness.  Musculoskeletal:        General: No tenderness or edema.     Comments: No lower back tenderness  Neurological: She is alert. She exhibits normal muscle tone. Coordination normal.  Skin: Skin is warm and dry.  Psychiatric: Her speech is normal and behavior is normal. Thought content normal. Her mood appears anxious. She expresses no homicidal and no suicidal ideation. She expresses no suicidal plans and no homicidal plans.  Vitals reviewed.   BP 132/86   Pulse 80   Temp 98.9  F (37.2 C) (Oral)   Wt 185 lb (83.9 kg)   LMP 05/30/2018 (Approximate)   BMI 30.79 kg/m  Wt Readings from Last 3 Encounters:  06/03/18 185 lb (83.9 kg)  05/29/18 191 lb 5.8 oz (86.8 kg)  05/16/18 160 lb (72.6 kg)     Health Maintenance Due  Topic Date Due  . MAMMOGRAM  09/18/1986    There are no preventive care reminders to display for this patient.  Lab Results  Component Value Date   TSH 1.250 05/30/2018   Lab Results  Component Value Date   WBC 12.1 (H) 06/01/2018   HGB 12.1 06/01/2018   HCT 38.2 06/01/2018   MCV 74.6 (L) 06/01/2018   PLT 549 (H) 06/01/2018   Lab Results  Component Value Date   NA 132 (L) 06/01/2018   K 3.7 06/01/2018   CO2 22 06/01/2018   GLUCOSE 117 (H) 06/01/2018   BUN 10 06/01/2018   CREATININE 0.82 06/01/2018   BILITOT 0.7 06/01/2018   ALKPHOS 63 06/01/2018   AST 20 06/01/2018   ALT 15 06/01/2018   PROT 8.6 (H) 06/01/2018   ALBUMIN 3.9 06/01/2018   CALCIUM 9.8 06/01/2018   ANIONGAP 15 06/01/2018   Lab Results  Component Value Date   CHOL 191 12/10/2013   Lab Results  Component Value Date   HDL 71 12/10/2013   Lab Results  Component Value Date   LDLCALC 105 (H) 12/10/2013   Lab Results  Component Value Date   TRIG 73 12/10/2013   Lab Results  Component Value Date   CHOLHDL 2.7 12/10/2013   Lab Results  Component Value Date   HGBA1C 5.0 08/21/2011      Assessment & Plan:   Problem List Items Addressed This Visit      Cardiovascular and Mediastinum   Essential hypertension    Hypertension well controlled with current medications.  Continue.        Genitourinary   Uterine leiomyoma -  Primary    As seen on ultrasound 06/01/2018.  Referral to GYN.      Relevant Orders   Ambulatory referral to Gynecology     Other   Abdominal pain    Likely due to constipation, may have a mood component as well.  Improved with ongoing MiraLAX treatment.  Continue MiraLAX.      Generalized anxiety disorder    High  school and gad 7.  Currently on low-dose benzodiazepine however plan to wean and try SNRI.  Patient also has history of chronic pain.  Plan follow-up in 2 weeks.      Relevant Medications   DULoxetine (CYMBALTA) 20 MG capsule   Hyponatremia    Mild, asymptomatic.  Following up from hospital labs today.      Relevant Orders   Comprehensive metabolic panel      Meds ordered this encounter  Medications  . DULoxetine (CYMBALTA) 20 MG capsule    Sig: Take 1 capsule (20 mg total) by mouth daily for 14 days.    Dispense:  14 capsule    Refill:  0    Follow-up: Return in about 2 weeks (around 06/17/2018) for anxiety and abdominal pain.    Bonnita Hollow, MD

## 2018-06-03 NOTE — Telephone Encounter (Signed)
**  After Hours/ Emergency Line Call**  Received a call to report that Susan Sanders is experiencing continued L sided lower abdominal pain. She states her pain is the same pain she has been experiencing for the past 2 weeks and has not changed in character or intensity. She was seen in the ED 06/01/18 for similar and was told her pain was likely due to constipation and uterine fibroids evidenced on transvaginal US. She was given morphine for her pain and miralax. She states she has since had a BM for the first time in 5-6 days without relief in her pain. She denies nausea, vomiting, dysuria, difficulties urinating, vaginal bleeding. She has not taken her temperature but doesn't think she has a fever. She did however "feel hot when [she] had a BM." She has been taking her BP at home ranging mainly 130s/60s.  Reassured patient that given normal vitals, pain has not changed, she is not febrile or having N/V, likely not an emergency. Labs from ED visit reviewed. On chart review, it appears there has been a referral placed to GYN for consultation for fibroid removal. After shared decision making, recommended that she call the GYN office to check the status of her referral and possible appointment. If unavailable to make appointment soon, recommended she call our office to make appointment for further evaluation and pain control until she is able to be seen by GYN, does have a hospital follow-up scheduled 06/06/18. Red flags discussed.  Will forward to PCP.  Rory Percy, DO PGY-2, [facility] 06/03/2018 8:09 AM

## 2018-06-03 NOTE — Assessment & Plan Note (Signed)
Hypertension well controlled with current medications.  Continue.

## 2018-06-03 NOTE — Assessment & Plan Note (Signed)
High school and gad 7.  Currently on low-dose benzodiazepine however plan to wean and try SNRI.  Patient also has history of chronic pain.  Plan follow-up in 2 weeks.

## 2018-06-03 NOTE — Assessment & Plan Note (Signed)
Mild, asymptomatic.  Following up from hospital labs today.

## 2018-06-03 NOTE — Assessment & Plan Note (Signed)
As seen on ultrasound 06/01/2018.  Referral to GYN.

## 2018-06-04 LAB — COMPREHENSIVE METABOLIC PANEL
A/G RATIO: 1.4 (ref 1.2–2.2)
ALT: 17 IU/L (ref 0–32)
AST: 19 IU/L (ref 0–40)
Albumin: 4.1 g/dL (ref 3.8–4.8)
Alkaline Phosphatase: 65 IU/L (ref 39–117)
BUN/Creatinine Ratio: 17 (ref 9–23)
BUN: 12 mg/dL (ref 6–24)
CO2: 24 mmol/L (ref 20–29)
Calcium: 9.6 mg/dL (ref 8.7–10.2)
Chloride: 93 mmol/L — ABNORMAL LOW (ref 96–106)
Creatinine, Ser: 0.71 mg/dL (ref 0.57–1.00)
GFR calc Af Amer: 116 mL/min/{1.73_m2} (ref >59)
GFR calc non Af Amer: 100 mL/min/{1.73_m2} (ref >59)
Globulin, Total: 3 g/dL (ref 1.5–4.5)
Glucose: 80 mg/dL (ref 65–99)
Potassium: 3.9 mmol/L (ref 3.5–5.2)
Sodium: 134 mmol/L (ref 134–144)
Total Protein: 7.1 g/dL (ref 6.0–8.5)

## 2018-06-06 ENCOUNTER — Encounter: Payer: Self-pay | Admitting: Family Medicine

## 2018-06-06 ENCOUNTER — Other Ambulatory Visit: Payer: Self-pay

## 2018-06-06 ENCOUNTER — Ambulatory Visit (INDEPENDENT_AMBULATORY_CARE_PROVIDER_SITE_OTHER): Payer: BLUE CROSS/BLUE SHIELD | Admitting: Family Medicine

## 2018-06-06 VITALS — BP 158/80 | HR 89 | Temp 98.5°F | Ht 65.0 in | Wt 189.6 lb

## 2018-06-06 DIAGNOSIS — Z3202 Encounter for pregnancy test, result negative: Secondary | ICD-10-CM | POA: Diagnosis not present

## 2018-06-06 DIAGNOSIS — M545 Low back pain, unspecified: Secondary | ICD-10-CM

## 2018-06-06 DIAGNOSIS — I10 Essential (primary) hypertension: Secondary | ICD-10-CM

## 2018-06-06 DIAGNOSIS — R1031 Right lower quadrant pain: Secondary | ICD-10-CM | POA: Diagnosis not present

## 2018-06-06 LAB — POCT URINE PREGNANCY: Preg Test, Ur: NEGATIVE

## 2018-06-06 NOTE — Assessment & Plan Note (Signed)
Could be a combination of mild constipation, lingering abdominal tenderness from menstruation, fibroids, and possibly radiating back pain.  Given tenderness to palpation and rebound tenderness, will obtain a CBC and BMP to investigate for possible appendicitis, although I think this is unlikely.  Patient had a CT abdomen 1 week ago that was negative, which is reassuring.  We will also obtain a urine pregnancy test.

## 2018-06-06 NOTE — Progress Notes (Signed)
Transitions of Care Follow Up Call Note  Susan Sanders is an 48 y.o. female who presented to [facility] on 05/29/2018.  The patient had the following prescriptions filled at [facility]: Chlorthalidone, Lorazepam, Gabapentin  Patient was called by pharmacist and the following questions were asked about the prescriptions filled at [facility]:  Has the patient been experiencing any side effects to the medications prescribed? Yes, pt endorsed some slight drowsiness that could be possibly related to her medications  Understanding of regimen: fair Understanding of indications: fair Potential of compliance: fair  Pharmacist comments: Counseled patient that lorazepam and gabapentin could be contributing to her drowsiness and to follow up with PCP if it did not improve. Of note, she was seen in the family medicine clinic today for abdominal and back pain   [x]  Patient's refills on her gabapentin prescription filled at the [facility] were transferred to the following pharmacy: [facility] [address]  []  Patient unable to be reached after calling three times and prescriptions filled at the [facility] were transferred to preferred pharmacy found within their chart.   Rush Barer 06/06/2018, 5:17 PM Transitions of Care Pharmacy Hours: Monday - Friday 8:30am to 5:00 PM  Phone - [phone]

## 2018-06-06 NOTE — Assessment & Plan Note (Signed)
Patient was hypertensive today with an initial blood pressure of 162/82 and a repeat of 158/80.  Patient says she took her blood pressure medications this morning.  This is likely due to her acute pain.  Will reassess in 1 month.

## 2018-06-06 NOTE — Patient Instructions (Addendum)
It was nice meeting you today Susan Sanders!  For your back pain, please take meloxicam once per day and pick up the Cymbalta today.  Both of these medications will likely help your pain.  Please also follow the attached back stretches and exercises.  Your pain definitely seems to be muscular in nature, so these stretches and exercises will likely help you feel a lot better.  I am also sending in a referral to physical therapy today.  It may take a couple weeks for you to hear back from them.  For your abdominal pain, we are getting a blood test today, and I will let you know if it looks concerning when it comes back.  If your symptoms worsen or you develop symptoms of a fever over the weekend, please go to urgent care or the emergency department for evaluation of appendicitis, although I think it is unlikely that you have this.  If you have any questions or concerns, please feel free to call the clinic.   Be well,  Dr. Shan Levans  Back Exercises The following exercises strengthen the muscles that help to support the back. They also help to keep the lower back flexible. Doing these exercises can help to prevent back pain or lessen existing pain. If you have back pain or discomfort, try doing these exercises 2-3 times each day or as told by your health care provider. When the pain goes away, do them once each day, but increase the number of times that you repeat the steps for each exercise (do more repetitions). If you do not have back pain or discomfort, do these exercises once each day or as told by your health care provider. Exercises Single Knee to Chest Repeat these steps 3-5 times for each leg: 1. Lie on your back on a firm bed or the floor with your legs extended. 2. Bring one knee to your chest. Your other leg should stay extended and in contact with the floor. 3. Hold your knee in place by grabbing your knee or thigh. 4. Pull on your knee until you feel a gentle stretch in your lower  back. 5. Hold the stretch for 10-30 seconds. 6. Slowly release and straighten your leg. Pelvic Tilt Repeat these steps 5-10 times: 1. Lie on your back on a firm bed or the floor with your legs extended. 2. Bend your knees so they are pointing toward the ceiling and your feet are flat on the floor. 3. Tighten your lower abdominal muscles to press your lower back against the floor. This motion will tilt your pelvis so your tailbone points up toward the ceiling instead of pointing to your feet or the floor. 4. With gentle tension and even breathing, hold this position for 5-10 seconds. Cat-Cow Repeat these steps until your lower back becomes more flexible: 1. Get into a hands-and-knees position on a firm surface. Keep your hands under your shoulders, and keep your knees under your hips. You may place padding under your knees for comfort. 2. Let your head hang down, and point your tailbone toward the floor so your lower back becomes rounded like the back of a cat. 3. Hold this position for 5 seconds. 4. Slowly lift your head and point your tailbone up toward the ceiling so your back forms a sagging arch like the back of a cow. 5. Hold this position for 5 seconds.  Press-Ups Repeat these steps 5-10 times: 1. Lie on your abdomen (face-down) on the floor. 2. Place your palms  near your head, about shoulder-width apart. 3. While you keep your back as relaxed as possible and keep your hips on the floor, slowly straighten your arms to raise the top half of your body and lift your shoulders. Do not use your back muscles to raise your upper torso. You may adjust the placement of your hands to make yourself more comfortable. 4. Hold this position for 5 seconds while you keep your back relaxed. 5. Slowly return to lying flat on the floor.  Bridges Repeat these steps 10 times: 1. Lie on your back on a firm surface. 2. Bend your knees so they are pointing toward the ceiling and your feet are flat on the  floor. 3. Tighten your buttocks muscles and lift your buttocks off of the floor until your waist is at almost the same height as your knees. You should feel the muscles working in your buttocks and the back of your thighs. If you do not feel these muscles, slide your feet 1-2 inches farther away from your buttocks. 4. Hold this position for 3-5 seconds. 5. Slowly lower your hips to the starting position, and allow your buttocks muscles to relax completely. If this exercise is too easy, try doing it with your arms crossed over your chest. Abdominal Crunches Repeat these steps 5-10 times: 1. Lie on your back on a firm bed or the floor with your legs extended. 2. Bend your knees so they are pointing toward the ceiling and your feet are flat on the floor. 3. Cross your arms over your chest. 4. Tip your chin slightly toward your chest without bending your neck. 5. Tighten your abdominal muscles and slowly raise your trunk (torso) high enough to lift your shoulder blades a tiny bit off of the floor. Avoid raising your torso higher than that, because it can put too much stress on your low back and it does not help to strengthen your abdominal muscles. 6. Slowly return to your starting position. Back Lifts Repeat these steps 5-10 times: 1. Lie on your abdomen (face-down) with your arms at your sides, and rest your forehead on the floor. 2. Tighten the muscles in your legs and your buttocks. 3. Slowly lift your chest off of the floor while you keep your hips pressed to the floor. Keep the back of your head in line with the curve in your back. Your eyes should be looking at the floor. 4. Hold this position for 3-5 seconds. 5. Slowly return to your starting position. Contact a health care provider if:  Your back pain or discomfort gets much worse when you do an exercise.  Your back pain or discomfort does not lessen within 2 hours after you exercise. If you have any of these problems, stop doing these  exercises right away. Do not do them again unless your health care provider says that you can. Get help right away if:  You develop sudden, severe back pain. If this happens, stop doing the exercises right away. Do not do them again unless your health care provider says that you can. This information is not intended to replace advice given to you by your health care provider. Make sure you discuss any questions you have with your health care provider. Document Released: 06/07/2004 Document Revised: 09/03/2017 Document Reviewed: 06/24/2014 Elsevier Interactive Patient Education  [employer].

## 2018-06-06 NOTE — Progress Notes (Signed)
Subjective:    Susan Sanders - 48 y.o. female MRN [id number]  Date of birth: 1971/01/17  CC:  Susan Sanders is here for abdominal pain and back pain.  HPI: Back pain Pain is located along lower abdomen and in lower back Back pain seemed to get worse about two weeks ago when she fell  Has had back pain in the past, but the fall significantly worsened her pain Has not improved since her fall Mornings are the worst, but she is sore throughout the day No other joints are bothering her Has not yet picked up Cymbalta, but she is planning to do that today No saddle anesthesia or incontinence No radiation to her legs  Abdominal pain Thinks Miralax is helping her constipation - has bowel movements daily that are somewhat hard but are better than usual  Continues to have periods, which she describes as "terrible" due to cramping and heavy bleeding.  Periods last about 7 days Denies nausea and dysuria  Health Maintenance:  Health Maintenance Due  Topic Date Due  . MAMMOGRAM  09/18/1986    -  reports that she quit smoking about 7 years ago. Her smoking use included cigarettes. She has a 14.00 pack-year smoking history. She has never used smokeless tobacco. - Review of Systems: Per HPI. - Past Medical History: Patient Active Problem List   Diagnosis Date Noted  . Generalized anxiety disorder 06/03/2018  . Uterine leiomyoma 06/03/2018  . Hyponatremia 06/03/2018  . Abdominal pain 05/29/2018  . Overweight 11/02/2016  . Muscle weakness (generalized)   . Syncope and collapse 02/04/2016  . Essential hypertension 07/14/2015  . Cervical spondylolysis 06/18/2014  . Healthcare maintenance 12/10/2013  . Former smoker 12/10/2013  . GERD (gastroesophageal reflux disease) 12/10/2013  . Fatigue 09/09/2013  . Pain, joint, hand, right 05/20/2013  . Leg pain, bilateral 04/22/2012  . Dyshidrotic eczema 12/26/2011  . Back pain 12/05/2010  . ALLERGIC RHINITIS 08/20/2008  . Menorrhagia with regular  cycle 03/20/2007  . MIGRAINE HEADACHE 02/20/2007  . Anxiety and depression 01/23/2007   - Medications: reviewed and updated   Objective:   Physical Exam BP (!) 158/80   Pulse 89   Temp 98.5 F (36.9 C) (Oral)   Ht 5\' 5"  (1.651 m)   Wt 189 lb 9.6 oz (86 kg)   LMP 05/30/2018 (Approximate)   SpO2 99%   BMI 31.55 kg/m  Gen: NAD, alert, cooperative with exam, appears tired and uncomfortable Abd: Tender to palpation along the left and right lower quadrants.  Rebound tenderness is present.  McBurney's point is positive as well as Rovsing's sign.  Obturator sign negative.  BS present, no guarding or organomegaly Musculoskeletal: Straight leg test negative bilaterally, tender to palpation on the midline and right lower lumbar regions but no bony tenderness, limited range of motion on lumbar extension, but full range of motion with lumbar flexion, lateral extension, and rotation Psych: good insight, alert and oriented        Assessment & Plan:   Back pain Appears to be acute on chronic.  Also appears to be muscular in nature given tenderness to palpation along the right paraspinal muscles and relief with stretching during exam.  Encouraged patient to pick up Cymbalta today and to take the meloxicam she has at home along with this.  Gave patient a handout on back stretches and exercises and placed a referral to physical therapy.  Abdominal pain Could be a combination of mild constipation, lingering abdominal tenderness from menstruation,  fibroids, and possibly radiating back pain.  Given tenderness to palpation and rebound tenderness, will obtain a CBC and BMP to investigate for possible appendicitis, although I think this is unlikely.  Patient had a CT abdomen 1 week ago that was negative, which is reassuring.  We will also obtain a urine pregnancy test.  Essential hypertension Patient was hypertensive today with an initial blood pressure of 162/82 and a repeat of 158/80.  Patient says she  took her blood pressure medications this morning.  This is likely due to her acute pain.  Will reassess in 1 month.    Maia Breslow, M.D. 06/06/2018, 9:50 AM PGY-2, [place]

## 2018-06-06 NOTE — Assessment & Plan Note (Signed)
Appears to be acute on chronic.  Also appears to be muscular in nature given tenderness to palpation along the right paraspinal muscles and relief with stretching during exam.  Encouraged patient to pick up Cymbalta today and to take the meloxicam she has at home along with this.  Gave patient a handout on back stretches and exercises and placed a referral to physical therapy.

## 2018-06-07 LAB — BASIC METABOLIC PANEL
BUN/Creatinine Ratio: 9 (ref 9–23)
BUN: 6 mg/dL (ref 6–24)
CO2: 25 mmol/L (ref 20–29)
Calcium: 9.3 mg/dL (ref 8.7–10.2)
Chloride: 99 mmol/L (ref 96–106)
Creatinine, Ser: 0.7 mg/dL (ref 0.57–1.00)
GFR calc Af Amer: 118 mL/min/{1.73_m2} (ref >59)
GFR calc non Af Amer: 102 mL/min/{1.73_m2} (ref >59)
Glucose: 88 mg/dL (ref 65–99)
Potassium: 4.4 mmol/L (ref 3.5–5.2)
SODIUM: 140 mmol/L (ref 134–144)

## 2018-06-07 LAB — CBC WITH DIFFERENTIAL/PLATELET
Basophils Absolute: 0.1 10*3/uL (ref 0.0–0.2)
Basos: 1 %
EOS (ABSOLUTE): 0.2 10*3/uL (ref 0.0–0.4)
Eos: 2 %
Hematocrit: 32.4 % — ABNORMAL LOW (ref 34.0–46.6)
Hemoglobin: 10.2 g/dL — ABNORMAL LOW (ref 11.1–15.9)
Immature Grans (Abs): 0.1 10*3/uL (ref 0.0–0.1)
Immature Granulocytes: 1 %
Lymphocytes Absolute: 2.5 10*3/uL (ref 0.7–3.1)
Lymphs: 28 %
MCH: 24.1 pg — ABNORMAL LOW (ref 26.6–33.0)
MCHC: 31.5 g/dL (ref 31.5–35.7)
MCV: 77 fL — ABNORMAL LOW (ref 79–97)
Monocytes Absolute: 0.5 10*3/uL (ref 0.1–0.9)
Monocytes: 6 %
NEUTROS PCT: 62 %
Neutrophils Absolute: 5.8 10*3/uL (ref 1.4–7.0)
Platelets: 618 10*3/uL — ABNORMAL HIGH (ref 150–450)
RBC: 4.23 x10E6/uL (ref 3.77–5.28)
RDW: 16.2 % — ABNORMAL HIGH (ref 11.7–15.4)
WBC: 9.1 10*3/uL (ref 3.4–10.8)

## 2018-06-11 ENCOUNTER — Inpatient Hospital Stay: Payer: BLUE CROSS/BLUE SHIELD | Admitting: Family Medicine

## 2018-06-17 ENCOUNTER — Other Ambulatory Visit: Payer: Self-pay

## 2018-06-17 ENCOUNTER — Telehealth: Payer: Self-pay

## 2018-06-17 ENCOUNTER — Ambulatory Visit (INDEPENDENT_AMBULATORY_CARE_PROVIDER_SITE_OTHER): Payer: BLUE CROSS/BLUE SHIELD | Admitting: Family Medicine

## 2018-06-17 ENCOUNTER — Encounter: Payer: Self-pay | Admitting: Family Medicine

## 2018-06-17 VITALS — BP 148/82 | HR 110 | Temp 98.9°F | Wt 193.0 lb

## 2018-06-17 DIAGNOSIS — M545 Low back pain, unspecified: Secondary | ICD-10-CM

## 2018-06-17 DIAGNOSIS — F411 Generalized anxiety disorder: Secondary | ICD-10-CM

## 2018-06-17 DIAGNOSIS — I10 Essential (primary) hypertension: Secondary | ICD-10-CM

## 2018-06-17 MED ORDER — MELOXICAM 15 MG PO TABS: 15.0000 mg | ORAL_TABLET | Freq: Every day | ORAL | 0 refills | Status: AC

## 2018-06-17 MED ORDER — AMLODIPINE BESYLATE 10 MG PO TABS: 10.0000 mg | ORAL_TABLET | Freq: Every day | ORAL | 0 refills | Status: DC

## 2018-06-17 MED ORDER — DULOXETINE HCL 40 MG PO CPEP: 40.0000 mg | ORAL_CAPSULE | Freq: Every day | ORAL | 11 refills | Status: DC

## 2018-06-17 MED ORDER — CHLORTHALIDONE 50 MG PO TABS: 50.0000 mg | ORAL_TABLET | Freq: Every day | ORAL | 0 refills | Status: DC

## 2018-06-17 NOTE — Assessment & Plan Note (Signed)
Elevated blood pressure.  Uncontrolled.  Likely slightly elevated due to chronic NSAID use.  Will increase chlorthalidone.  Patient to check blood pressure at home and call back numbers.  If still elevated, patient to return in 1 month.

## 2018-06-17 NOTE — Progress Notes (Signed)
Established Patient Office Visit  Subjective:  Patient ID: Susan Sanders, female    DOB: 12-19-1970  Age: 48 y.o. MRN: [id number]  CC:  Chief Complaint  Patient presents with  . Back Pain    HPI Susan Sanders presents for follow-up on back pain.  States that her back pain is much improved.  She is able to get around.  She says she is been taking meloxicam regularly.  She is never taking ibuprofen.  She is also been taking Cymbalta and physical therapy.  Said that her mood is also improved.  Patient did not have improvement with less relaxers.  There is no radiation of the pain.  Is located in her lower mid back.  She has had no fevers chills, unintentional weight loss.  Hypertension She has had elevated hypertension past the 3 times she has been in clinic.  She has had systolic blood pressures ranging in the 140s to 160s.  She has been taking her amlodipine and chlorthalidone.  She denies any lower extremity swelling, chest pain, shortness of breath, headache, blurry vision.  Past Medical History:  Diagnosis Date  . Anemia   . Anxiety   . Cellulitis   . Chest pain   . Childhood asthma   . Chronic back pain    "all over" (05/29/2018)  . Complication of anesthesia    "difficult waking up " (03/30/2019)  . Depression   . Eczema   . Hypertension   . Scoliosis     Past Surgical History:  Procedure Laterality Date  . TUBAL LIGATION      Family History  Problem Relation Age of Onset  . Hypertension Mother     Social History   Socioeconomic History  . Marital status: Divorced    Spouse name: Not on file  . Number of children: Not on file  . Years of education: Not on file  . Highest education level: Not on file  Occupational History  . Not on file  Social Needs  . Financial resource strain: Not on file  . Food insecurity:    Worry: Not on file    Inability: Not on file  . Transportation needs:    Medical: Not on file    Non-medical: Not on file  Tobacco Use  .  Smoking status: Former Smoker    Packs/day: 0.50    Years: 28.00    Pack years: 14.00    Types: Cigarettes    Last attempt to quit: 09/20/2010    Years since quitting: 7.7  . Smokeless tobacco: Never Used  Substance and Sexual Activity  . Alcohol use: Yes    Alcohol/week: 2.0 standard drinks    Types: 2 Cans of beer per week  . Drug use: No  . Sexual activity: Not on file  Lifestyle  . Physical activity:    Days per week: Not on file    Minutes per session: Not on file  . Stress: Not on file  Relationships  . Social connections:    Talks on phone: Not on file    Gets together: Not on file    Attends religious service: Not on file    Active member of club or organization: Not on file    Attends meetings of clubs or organizations: Not on file    Relationship status: Not on file  . Intimate partner violence:    Fear of current or ex partner: Not on file    Emotionally abused: Not on file  Physically abused: Not on file    Forced sexual activity: Not on file  Other Topics Concern  . Not on file  Social History Narrative  . Not on file    Outpatient Medications Prior to Visit  Medication Sig Dispense Refill  . acetaminophen (TYLENOL) 500 MG tablet Take 2 tablets (1,000 mg total) by mouth every 6 (six) hours as needed for mild pain or fever. 30 tablet 0  . gabapentin (NEURONTIN) 600 MG tablet Take 0.5 tablets (300 mg total) by mouth 3 (three) times daily. 60 tablet 0  . lidocaine (LIDODERM) 5 % Place 1 patch onto the skin daily. Remove & Discard patch within 12 hours or as directed by MD 30 patch 0  . Multiple Vitamins-Calcium (ONE-A-DAY WOMENS PO) Take 1 tablet by mouth daily.    . polyethylene glycol (MIRALAX) packet Take 17 g by mouth daily. 14 each 0  . amLODipine (NORVASC) 10 MG tablet Take 1 tablet (10 mg total) by mouth daily. 30 tablet 0  . chlorthalidone (HYGROTON) 25 MG tablet Take 1 tablet (25 mg total) by mouth daily. 30 tablet 0  . DULoxetine (CYMBALTA) 20 MG  capsule Take 1 capsule (20 mg total) by mouth daily for 14 days. 14 capsule 0  . ibuprofen (ADVIL,MOTRIN) 800 MG tablet Take 1 tablet (800 mg total) by mouth every 8 (eight) hours as needed. (Patient taking differently: Take 800 mg by mouth every 8 (eight) hours as needed for moderate pain. ) 10 tablet 0   No facility-administered medications prior to visit.     Allergies  Allergen Reactions  . Latex Shortness Of Breath    Itching and burns skin  . Amoxicillin Itching and Swelling    Has patient had a PCN reaction causing immediate rash, facial/tongue/throat swelling, SOB or lightheadedness with hypotension: Yes Has patient had a PCN reaction causing severe rash involving mucus membranes or skin necrosis: Yes Has patient had a PCN reaction that required hospitalization: Yes Has patient had a PCN reaction occurring within the last 10 years: Yes If all of the above answers are "NO", then may proceed with Cephalosporin use.   . Naproxen Hives and Itching    Takes IBU without difficulty  . Tramadol Hives and Itching    Takes IBU without difficulty    ROS Review of Systems  Constitutional: Negative for activity change and appetite change.  Musculoskeletal: Positive for back pain.  All other systems reviewed and are negative.     Objective:    Physical Exam  Constitutional: She appears well-developed and well-nourished. No distress.  HENT:  Head: Normocephalic and atraumatic.  Eyes: No scleral icterus.  Neck: No JVD present.  Cardiovascular: Normal rate and regular rhythm.  Pulmonary/Chest: Effort normal and breath sounds normal. No respiratory distress.  Abdominal: Soft. She exhibits no distension.  Musculoskeletal: Normal range of motion.        General: No edema.     Comments: Able to walk without issue, bend over without any pain in back.    Neurological: She is alert.  Skin: Skin is warm and dry.  Psychiatric: She has a normal mood and affect. Her behavior is normal.  Judgment and thought content normal.    BP (!) 148/82   Pulse (!) 110   Temp 98.9 F (37.2 C) (Oral)   Wt 193 lb (87.5 kg)   LMP 05/30/2018 (Approximate)   SpO2 98%   BMI 32.12 kg/m  Wt Readings from Last 3 Encounters:  06/17/18 193 lb (  87.5 kg)  06/06/18 189 lb 9.6 oz (86 kg)  06/03/18 185 lb (83.9 kg)     Health Maintenance Due  Topic Date Due  . MAMMOGRAM  09/18/1986    There are no preventive care reminders to display for this patient.  Lab Results  Component Value Date   TSH 1.250 05/30/2018   Lab Results  Component Value Date   WBC 9.1 06/06/2018   HGB 10.2 (L) 06/06/2018   HCT 32.4 (L) 06/06/2018   MCV 77 (L) 06/06/2018   PLT 618 (H) 06/06/2018   Lab Results  Component Value Date   NA 140 06/06/2018   K 4.4 06/06/2018   CO2 25 06/06/2018   GLUCOSE 88 06/06/2018   BUN 6 06/06/2018   CREATININE 0.70 06/06/2018   BILITOT <0.2 06/03/2018   ALKPHOS 65 06/03/2018   AST 19 06/03/2018   ALT 17 06/03/2018   PROT 7.1 06/03/2018   ALBUMIN 4.1 06/03/2018   CALCIUM 9.3 06/06/2018   ANIONGAP 15 06/01/2018   Lab Results  Component Value Date   CHOL 191 12/10/2013   Lab Results  Component Value Date   HDL 71 12/10/2013   Lab Results  Component Value Date   LDLCALC 105 (H) 12/10/2013   Lab Results  Component Value Date   TRIG 73 12/10/2013   Lab Results  Component Value Date   CHOLHDL 2.7 12/10/2013   Lab Results  Component Value Date   HGBA1C 5.0 08/21/2011      Assessment & Plan:   Problem List Items Addressed This Visit      Cardiovascular and Mediastinum   Essential hypertension    Elevated blood pressure.  Uncontrolled.  Likely slightly elevated due to chronic NSAID use.  Will increase chlorthalidone.  Patient to check blood pressure at home and call back numbers.  If still elevated, patient to return in 1 month.      Relevant Medications   amLODipine (NORVASC) 10 MG tablet   chlorthalidone (HYGROTON) 50 MG tablet     Other    Back pain - Primary    Back pain.  Improved.  Continue meloxicam and physical therapy, increase Cymbalta.  Patient follow-up as needed.      Relevant Medications   meloxicam (MOBIC) 15 MG tablet   Generalized anxiety disorder   Relevant Medications   DULoxetine 40 MG CPEP      Meds ordered this encounter  Medications  . DULoxetine 40 MG CPEP    Sig: Take 40 mg by mouth daily for 30 days.    Dispense:  30 capsule    Refill:  11  . meloxicam (MOBIC) 15 MG tablet    Sig: Take 1 tablet (15 mg total) by mouth daily for 30 days.    Dispense:  30 tablet    Refill:  0  . amLODipine (NORVASC) 10 MG tablet    Sig: Take 1 tablet (10 mg total) by mouth daily for 30 days.    Dispense:  30 tablet    Refill:  0  . chlorthalidone (HYGROTON) 50 MG tablet    Sig: Take 1 tablet (50 mg total) by mouth daily for 30 days.    Dispense:  30 tablet    Refill:  0    Follow-up: No follow-ups on file.    Bonnita Hollow, MD

## 2018-06-17 NOTE — Patient Instructions (Signed)
It was a pleasure to see you today! Thank you for choosing [facility] for your primary care. Susan Sanders was seen for back pain and Generalized anxiety disorder and high blood pressure.    1.  Blood pressure slightly elevated today.  We are increasing her chlorthalidone to 50 mg a day. 2.  For your back pain, continue take the Cymbalta which we are increasing to 40 mg, continue to physical therapy and take meloxicam. 3.  Cymbalta will continue to improve your mood.   Best,  Marny Lowenstein, MD, MS FAMILY MEDICINE RESIDENT - PGY2 06/17/2018 2:37 PM

## 2018-06-17 NOTE — Telephone Encounter (Signed)
Received fax from pharmacy, PA needed on Duloxetine 40 mg.    According to website Duloxetine 40 mg is non-formulary.   Duloxetine 20 mg (tier2), Duloxetine 30 mg (tier1), Duloxetine 60 mg (tier 1)  are covered by formulary.  Please send different strength as above.  Cover My Meds info: Key: [id number]  Danley Danker, RN [facility] RN)

## 2018-06-17 NOTE — Assessment & Plan Note (Signed)
Back pain.  Improved.  Continue meloxicam and physical therapy, increase Cymbalta.  Patient follow-up as needed.

## 2018-06-18 ENCOUNTER — Encounter: Payer: Self-pay | Admitting: Physical Therapy

## 2018-06-18 ENCOUNTER — Ambulatory Visit (INDEPENDENT_AMBULATORY_CARE_PROVIDER_SITE_OTHER): Payer: BLUE CROSS/BLUE SHIELD | Admitting: Gynecology

## 2018-06-18 ENCOUNTER — Telehealth: Payer: Self-pay | Admitting: *Deleted

## 2018-06-18 ENCOUNTER — Encounter: Payer: Self-pay | Admitting: Gynecology

## 2018-06-18 ENCOUNTER — Ambulatory Visit: Payer: BLUE CROSS/BLUE SHIELD | Attending: Family Medicine | Admitting: Physical Therapy

## 2018-06-18 VITALS — BP 124/82 | Ht 64.0 in | Wt 194.0 lb

## 2018-06-18 DIAGNOSIS — M5441 Lumbago with sciatica, right side: Secondary | ICD-10-CM

## 2018-06-18 DIAGNOSIS — N920 Excessive and frequent menstruation with regular cycle: Secondary | ICD-10-CM | POA: Diagnosis not present

## 2018-06-18 DIAGNOSIS — K5900 Constipation, unspecified: Secondary | ICD-10-CM | POA: Diagnosis not present

## 2018-06-18 DIAGNOSIS — N6452 Nipple discharge: Secondary | ICD-10-CM

## 2018-06-18 DIAGNOSIS — N186 End stage renal disease: Secondary | ICD-10-CM | POA: Diagnosis not present

## 2018-06-18 DIAGNOSIS — Z1151 Encounter for screening for human papillomavirus (HPV): Secondary | ICD-10-CM

## 2018-06-18 DIAGNOSIS — D500 Iron deficiency anemia secondary to blood loss (chronic): Secondary | ICD-10-CM | POA: Diagnosis not present

## 2018-06-18 DIAGNOSIS — N390 Urinary tract infection, site not specified: Secondary | ICD-10-CM | POA: Diagnosis not present

## 2018-06-18 DIAGNOSIS — N750 Cyst of Bartholin's gland: Secondary | ICD-10-CM

## 2018-06-18 DIAGNOSIS — R293 Abnormal posture: Secondary | ICD-10-CM | POA: Diagnosis not present

## 2018-06-18 DIAGNOSIS — D259 Leiomyoma of uterus, unspecified: Secondary | ICD-10-CM | POA: Diagnosis not present

## 2018-06-18 DIAGNOSIS — M62830 Muscle spasm of back: Secondary | ICD-10-CM | POA: Insufficient documentation

## 2018-06-18 DIAGNOSIS — Z01419 Encounter for gynecological examination (general) (routine) without abnormal findings: Secondary | ICD-10-CM

## 2018-06-18 DIAGNOSIS — N760 Acute vaginitis: Secondary | ICD-10-CM | POA: Diagnosis not present

## 2018-06-18 MED ORDER — DULOXETINE HCL 60 MG PO CPEP: 60.0000 mg | ORAL_CAPSULE | Freq: Every day | ORAL | 3 refills | Status: DC

## 2018-06-18 NOTE — Telephone Encounter (Signed)
Patient schedule on 06/24/18 @ 8:50am at breast center for diagnostic imaging. I called and pt asked me to call her back and leave on her voicemail a detailed message with time and date.

## 2018-06-18 NOTE — Telephone Encounter (Signed)
-----   Message from Ramond Craver, [place] sent at 06/18/2018  9:47 AM EST ----- Regarding: appt at [facility] Per Dr. Loetta Rough. This is written on bottom of surgery slip order he just sent me.  "Need breast evaluated first before surgery.  Patient has a bloody right nipple discharge.  She needs a diagnostic mammography and ductogram scheduled at the breast center."

## 2018-06-18 NOTE — Progress Notes (Addendum)
Susan Sanders 1971/02/19 [id number]        48 y.o.  [id number] new patient for annual gynecologic exam.  Also complaining of heavy menses over the past several years.  They appear to be getting heavier where she will wear 2 pads and a tampon and still have bleedthrough episodes.  Recent ultrasound showed enlarged uterus with multiple myomas.  Endometrial echo 2 mm.  Right and left ovaries not visualized.  Recent hemoglobin 10.2.  The patient has recently started taking extra iron daily.  Review of her records show an ultrasound from 2010 where no myomas were noted and a follow-up ultrasound 2015 where several myomas were measured.  No history of abnormal Pap smears  Past medical history,surgical history, problem list, medications, allergies, family history and social history were all reviewed and documented as reviewed in the EPIC chart.  ROS:  Performed with pertinent positives and negatives included in the history, assessment and plan.   Additional significant findings : None   Exam: [name] assistant Vitals:   06/18/18 0832  BP: 124/82  Weight: 194 lb (88 kg)  Height: 5\' 4"  (1.626 m)   Body mass index is 33.3 kg/m.  General appearance:  Normal affect, orientation and appearance. Skin: Grossly normal HEENT: Without gross lesions.  No cervical or supraclavicular adenopathy. Thyroid normal.  Lungs:  Clear without wheezing, rales or rhonchi Cardiac: RR, without RMG Abdominal:  Soft, nontender, without masses, guarding, rebound, organomegaly or hernia Breasts:  Examined lying and sitting.  Left without masses, retractions, discharge or axillary adenopathy.  Right with bloody nipple discharge appearing from 1 duct.  No underlying palpable masses.  No other masses, retractions, adenopathy Pelvic:  Ext, BUS, Vagina: With soft nontender 3 cm classic right Bartholin cyst  Cervix: Normal.  Pap smear/HPV  Uterus: Bulky 16 weeks size filling the pelvis irregular consistent with her history  of leiomyoma  Adnexa: Unable to evaluate due to bulk of the uterus  Anus and perineum: Normal   Rectovaginal: Normal sphincter tone without palpated masses or tenderness.    Assessment/Plan:  [name] female for annual gynecologic exam.  Regular heavy menses, tubal sterilization  1. Menorrhagia/leiomyoma.  With associated anemia.  Started on iron replacement and will continue on this.  Requiring 2 pads and tampons with bleedthrough episodes.  Options for management reviewed to include hormonal manipulation, endometrial ablation, Mirena IUD, uterine artery embolization, myomectomy and hysterectomy.  The pros and cons of each choice reviewed with the patient.  The patient strongly wants to move towards scheduling hysterectomy.  We discussed the ovarian conservation issue and the options to keep both ovaries for a more natural transition through menopause but also maintaining her risk for ovarian disease in the future versus removing both ovaries and abruptly entering menopause with or without hormone replacement therapy.  The patient wants both ovaries removed and would accept HRT afterwards.  We discussed with involved with this and the risks to include thrombosis such as stroke heart attack DVT in the breast cancer issue.  We discussed surgical approaches to include attempted LAVH, robotic and TAH.  Her uterus is bulky and the possibility of failed laparoscopic approach with subsequent TAH discussed.  The patient would prefer moving directly to a TAH excepting a larger incision and potentially longer recovery.  The risks of infection, transfusion, damage to internal organs with subsequent reparative surgeries all reviewed with her.  She would except transfusion if needed.  Will move towards scheduling her surgery and  she will follow-up for preop appointment before hand.  Her recent ultrasound confirmed multiple myomas.  Her ovaries were not visualized but no adnexal pathology was noted.  Her menses  are regular with endometrial echo measured at 2 mm on recent ultrasound and I do not think further endometrial assessment needed preoperatively. 2. Bloody nipple discharge right breast from one apparent duct.  She does have a history of galactorrhea with milky discharge over the past years in both breasts.  I did not elicit any discharge from the left breast and only bloody from the right.  Discussed differential to include intraductal pathology and the need for evaluation.  Will make arrangements for radiology assessment and ductogram.  Ultimate surgical referral as needed.  Check baseline prolactin also today.  The patient's never had a mammogram and the need for regular mammogram stressed. 3. Right Bartholin cyst classic in appearance.  Not bothersome to the patient.  Recommended patient monitor with self exams and as long as it remains unchanged and not bothering her to follow.  If it would enlarge or become symptomatic she will represent for evaluation. 4. Pap smear reported 2015.  Pap smear/HPV today.  No history of abnormal Pap smears previously. 5. Health maintenance.  No routine lab work done as patient has a primary provider who she follows up with.    Anastasio Auerbach MD, 8:55 AM 06/18/2018

## 2018-06-18 NOTE — Patient Instructions (Addendum)
Access Code: [email or web address]  URL: [email or web address]  Date: 06/18/2018  Prepared by: Elsie Ra   Exercises  Left Standing Lateral Shift Correction at [place] - 10 reps - 3 sets - 2x daily - 6x weekly  Standing Lumbar Extension - 10 reps - 1-2 sets - 5 hold - 2x daily - 6x weekly  Supine Piriformis Stretch - 3 reps - 1 sets - 30 hold - 2x daily - 6x weekly  Supine Hamstring Stretch with Strap - 3 reps - 1 sets - 30 hold - 2x daily - 6x weekly  Supine Lower Trunk Rotation - 2-3 reps - 1 sets - 10 hold - 2x daily - 6x weekly  Supine Bridge - 10 reps - 2 sets - 5 hold - 2x daily - 6x weekly   TENS UNIT: This is helpful for muscle pain and spasm.   Search and Purchase a TENS 7000 2nd edition at www.tenspros.com. It should be less than $30.     TENS unit instructions: Do not shower or bathe with the unit on Turn the unit off before removing electrodes or batteries If the electrodes lose stickiness add a drop of water to the electrodes after they are disconnected from the unit and place on plastic sheet. If you continued to have difficulty, call the TENS unit company to purchase more electrodes. Do not apply lotion on the skin area prior to use. Make sure the skin is clean and dry as this will help prolong the life of the electrodes. After use, always check skin for unusual red areas, rash or other skin difficulties. If there are any skin problems, does not apply electrodes to the same area. Never remove the electrodes from the unit by pulling the wires. Do not use the TENS unit or electrodes other than as directed. Do not change electrode placement without consultating your therapist or physician. Keep 2 fingers with between each electrode. Wear time ratio is 2:1, on to off times.    For example on for 30 minutes off for 15 minutes and then on for 30 minutes off for 15 minutes
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
## 2018-06-18 NOTE — Addendum Note (Signed)
Addended by: Nelva Nay on: 06/18/2018 10:40 AM   Modules accepted: Orders

## 2018-06-18 NOTE — Patient Instructions (Signed)
Radiology will contact you to arrange for the mammogram and work-up of the bloody nipple discharge  My office will contact you to make arrangements for surgery.

## 2018-06-19 LAB — PAP IG AND HPV HIGH-RISK: HPV DNA High Risk: NOT DETECTED

## 2018-06-19 LAB — PROLACTIN: Prolactin: 25.5 ng/mL

## 2018-06-19 NOTE — Telephone Encounter (Signed)
MD Rx'd duloxetine 60mg . Fleeger, Salome Spotted, CMA

## 2018-06-23 ENCOUNTER — Encounter: Payer: BLUE CROSS/BLUE SHIELD | Admitting: Physical Therapy

## 2018-06-23 ENCOUNTER — Telehealth: Payer: Self-pay | Admitting: Family Medicine

## 2018-06-23 ENCOUNTER — Telehealth: Payer: Self-pay | Admitting: Physical Therapy

## 2018-06-23 ENCOUNTER — Ambulatory Visit: Payer: Self-pay | Admitting: Gynecology

## 2018-06-23 NOTE — Telephone Encounter (Signed)
Left message on machine for missed visit.  Date and time of next appointment given.  Our number given to call if she is unable to attend.   Melvenia Needles PTA

## 2018-06-23 NOTE — Telephone Encounter (Signed)
**  After Hours/ Emergency Line Call**  Received a call to report that Susan Sanders is endorsing lower R quadrant abdominal pain since earlier this afternoon around 2pm. She states she has pain from uterine fibroids but the location of her pain is different, is usually on the other side of her abdomen. She also has been vomiting and had to leave work early yesterday due to this.  Her pain is much exacerbated by movement. Last BM was 30 minutes ago and was soft and formed, no blood in her stool. She has tried heating pad and ibuprofen without much relief. She is unsure if she has a fever. Denies prior abdominal surgeries. Last ate this afternoon around 4pm. Has h/o uterine fibroids with most recent transvaginal US 06/01/2018. Seen by GYN on 2/5 and planning for hysterectomy. Explained to patient that while she has a history of uterine fibroids, I could not rule out more emergent pathology given new onset vomiting and different location of pain than usual. Recommended that patient present to the ED for imaging and labs. Patient verbalized understanding and states her 58yo daughter can drive her to the ED. Will forward to PCP.  Rory Percy, DO PGY-2, [facility] 06/23/2018 7:59 PM

## 2018-06-24 ENCOUNTER — Encounter (HOSPITAL_COMMUNITY): Payer: Self-pay | Admitting: Emergency Medicine

## 2018-06-24 ENCOUNTER — Emergency Department (HOSPITAL_COMMUNITY): Payer: BLUE CROSS/BLUE SHIELD

## 2018-06-24 ENCOUNTER — Ambulatory Visit: Payer: BLUE CROSS/BLUE SHIELD

## 2018-06-24 ENCOUNTER — Telehealth: Payer: Self-pay | Admitting: *Deleted

## 2018-06-24 ENCOUNTER — Emergency Department (HOSPITAL_COMMUNITY)
Admission: EM | Admit: 2018-06-24 | Discharge: 2018-06-24 | Disposition: A | Discharge status: Home / Self Care | Payer: BLUE CROSS/BLUE SHIELD | Attending: Emergency Medicine | Admitting: Emergency Medicine

## 2018-06-24 ENCOUNTER — Ambulatory Visit
Admission: RE | Admit: 2018-06-24 | Discharge: 2018-06-24 | Disposition: A | Discharge status: Home / Self Care | Payer: BLUE CROSS/BLUE SHIELD | Source: Ambulatory Visit | Attending: Gynecology | Admitting: Gynecology

## 2018-06-24 DIAGNOSIS — Z79899 Other long term (current) drug therapy: Secondary | ICD-10-CM | POA: Diagnosis not present

## 2018-06-24 DIAGNOSIS — I10 Essential (primary) hypertension: Secondary | ICD-10-CM | POA: Insufficient documentation

## 2018-06-24 DIAGNOSIS — R112 Nausea with vomiting, unspecified: Secondary | ICD-10-CM | POA: Diagnosis not present

## 2018-06-24 DIAGNOSIS — Z87891 Personal history of nicotine dependence: Secondary | ICD-10-CM | POA: Insufficient documentation

## 2018-06-24 DIAGNOSIS — Z91040 Latex allergy status: Secondary | ICD-10-CM | POA: Diagnosis not present

## 2018-06-24 DIAGNOSIS — D259 Leiomyoma of uterus, unspecified: Secondary | ICD-10-CM | POA: Diagnosis not present

## 2018-06-24 DIAGNOSIS — R1031 Right lower quadrant pain: Secondary | ICD-10-CM | POA: Diagnosis not present

## 2018-06-24 DIAGNOSIS — N6452 Nipple discharge: Secondary | ICD-10-CM

## 2018-06-24 LAB — COMPREHENSIVE METABOLIC PANEL
ALT: 17 U/L (ref 0–44)
ANION GAP: 14 (ref 5–15)
AST: 24 U/L (ref 15–41)
Albumin: 4 g/dL (ref 3.5–5.0)
Alkaline Phosphatase: 64 U/L (ref 38–126)
BUN: 12 mg/dL (ref 6–20)
CO2: 22 mmol/L (ref 22–32)
Calcium: 9.3 mg/dL (ref 8.9–10.3)
Chloride: 98 mmol/L (ref 98–111)
Creatinine, Ser: 0.83 mg/dL (ref 0.44–1.00)
GFR calc Af Amer: >60 mL/min (ref >60)
GFR calc non Af Amer: >60 mL/min (ref >60)
Glucose, Bld: 135 mg/dL — ABNORMAL HIGH (ref 70–99)
Potassium: 3.6 mmol/L (ref 3.5–5.1)
Sodium: 134 mmol/L — ABNORMAL LOW (ref 135–145)
Total Bilirubin: 1.1 mg/dL (ref 0.3–1.2)
Total Protein: 8.1 g/dL (ref 6.5–8.1)

## 2018-06-24 LAB — CBC
HCT: 35.8 % — ABNORMAL LOW (ref 36.0–46.0)
Hemoglobin: 11.1 g/dL — ABNORMAL LOW (ref 12.0–15.0)
MCH: 23.3 pg — ABNORMAL LOW (ref 26.0–34.0)
MCHC: 31 g/dL (ref 30.0–36.0)
MCV: 75.1 fL — ABNORMAL LOW (ref 80.0–100.0)
NRBC: 0 % (ref 0.0–0.2)
Platelets: 543 10*3/uL — ABNORMAL HIGH (ref 150–400)
RBC: 4.77 MIL/uL (ref 3.87–5.11)
RDW: 17.9 % — ABNORMAL HIGH (ref 11.5–15.5)
WBC: 9.7 10*3/uL (ref 4.0–10.5)

## 2018-06-24 LAB — LIPASE, BLOOD: Lipase: 32 U/L (ref 11–51)

## 2018-06-24 LAB — DIFFERENTIAL
Basophils Absolute: 0 10*3/uL (ref 0.0–0.1)
Basophils Relative: 0 %
Eosinophils Absolute: 0.1 10*3/uL (ref 0.0–0.5)
Eosinophils Relative: 1 %
LYMPHS PCT: 21 %
Lymphs Abs: 2 10*3/uL (ref 0.7–4.0)
Monocytes Absolute: 0.8 10*3/uL (ref 0.1–1.0)
Monocytes Relative: 8 %
NEUTROS ABS: 6.7 10*3/uL (ref 1.7–7.7)
Neutrophils Relative %: 69 %

## 2018-06-24 LAB — I-STAT BETA HCG BLOOD, ED (MC, WL, AP ONLY)
I-stat hCG, quantitative: 18.9 m[IU]/mL — ABNORMAL HIGH (ref <5)
I-stat hCG, quantitative: 16.6 m[IU]/mL — ABNORMAL HIGH (ref <5)

## 2018-06-24 MED ORDER — IOHEXOL 300 MG/ML  SOLN
100.0000 mL | Freq: Once | INTRAMUSCULAR | Status: AC | PRN
  Administered 2018-06-24: 100 mL via INTRAVENOUS

## 2018-06-24 MED ORDER — SODIUM CHLORIDE 0.9 % IV BOLUS
1000.0000 mL | Freq: Once | INTRAVENOUS | Status: AC
  Administered 2018-06-24: 1000 mL via INTRAVENOUS

## 2018-06-24 MED ORDER — KETOROLAC TROMETHAMINE 30 MG/ML IJ SOLN
30.0000 mg | Freq: Once | INTRAMUSCULAR | Status: AC
  Administered 2018-06-24: 30 mg via INTRAVENOUS
  Filled 2018-06-24: qty 1

## 2018-06-24 MED ORDER — HYDROMORPHONE HCL 1 MG/ML IJ SOLN
1.0000 mg | Freq: Once | INTRAMUSCULAR | Status: AC
  Administered 2018-06-24: 1 mg via INTRAVENOUS
  Filled 2018-06-24: qty 1

## 2018-06-24 MED ORDER — IBUPROFEN 800 MG PO TABS: 800.0000 mg | ORAL_TABLET | Freq: Three times a day (TID) | ORAL | 2 refills | Status: DC

## 2018-06-24 MED ORDER — ONDANSETRON 4 MG PO TBDP
4.0000 mg | ORAL_TABLET | Freq: Once | ORAL | Status: AC | PRN
  Administered 2018-06-24: 4 mg via ORAL
  Filled 2018-06-24: qty 1

## 2018-06-24 MED ORDER — ONDANSETRON 4 MG PO TBDP: 4.0000 mg | ORAL_TABLET | Freq: Three times a day (TID) | ORAL | 0 refills | Status: DC | PRN

## 2018-06-24 MED ORDER — ONDANSETRON HCL 4 MG/2ML IJ SOLN: 4.0000 mg | Freq: Once | INTRAMUSCULAR | Status: DC

## 2018-06-24 MED ORDER — SODIUM CHLORIDE 0.9% FLUSH: 3.0000 mL | Freq: Once | INTRAVENOUS | Status: DC

## 2018-06-24 NOTE — Telephone Encounter (Signed)
Patient said she can take ibuprofen, Rx sent to pharmacy aware of all the below. Juliann Pulse is aware to about scheduling surgery

## 2018-06-24 NOTE — Discharge Instructions (Signed)
1. Medications: Alternate 600 mg of ibuprofen and [phone] mg of Tylenol every 3 hours as needed for pain. Do not exceed 4000 mg of Tylenol daily.  Take ibuprofen with food to avoid upset stomach.  Take Zofran as needed for nausea.  Let this medicine dissolve under your tongue.  Wait around 10-20 minutes before eating or drinking after taking this medication. 2. Treatment: rest, drink plenty of fluids, advance diet slowly.  Start with water and broth then advance to bland foods that will not upset your stomach such as crackers, mashed potatoes, and peanut butter. 3. Follow Up: Please call your OB/GYN to set up follow-up for reevaluation of your symptoms.  They may want a repeat ultrasound. Please return to the ER for persistent vomiting, high fevers or worsening symptoms

## 2018-06-24 NOTE — Telephone Encounter (Signed)
Not sure her pain is from her fibroids necessarily.  They describe a lot of stool in her colon and whether this may be contributing to her pain.  As far as pain management ibuprofen 800 mg 3 times daily if she can this.  #30 with 2 refills.  Verify that Juliann Pulse is working on scheduling her hysterectomy.

## 2018-06-24 NOTE — ED Notes (Signed)
Pt given gingerale to sip, unable to eat [place] sandwich at this time.

## 2018-06-24 NOTE — ED Triage Notes (Signed)
Pt here for eval of fibroids. She has pain to RLQ quadrant, her back, and her mid abdomen. Denies upper chest pain. Pt states "I hurt all over." Pt crying. States she was supposed to have surgery on her fibroids. Pt seen for same 1/19. Pt took ibuprofen with no relief.

## 2018-06-24 NOTE — ED Provider Notes (Signed)
[facility]PART[facility] Provider Note   CSN: [id number] Arrival date & time: 06/24/18  0908     History   Chief Complaint Chief Complaint  Patient presents with  . Abdominal Pain    HPI Susan Sanders is a 48 y.o. female with history of anemia, anxiety, asthma, hypertension, uterine leiomyomas, GERD presents for evaluation of acute onset, progressively worsening right lower quadrant abdominal pain beginning acutely yesterday evening.  She reports that the pain is sharp, constant, no aggravating or alleviating factors noted.  It radiates "all over "primarily to the lower abdomen and low back.  She has had 4 episodes of nonbloody nonbilious emesis since symptom onset yesterday.  Bowel movements have been normal.  Denies urinary symptoms.  Denies vaginal itching, bleeding, or discharge out of the ordinary.  No fevers or chills.  Has been taking her home medicines including ibuprofen, Tylenol, gabapentin, and meloxicam without relief of her symptoms. Has a known history of multiple uterine fibroids and went to see her OB/GYN on 06/18/2018 with plan for hysterectomy.She states this pain feels somewhat different from her usual fibroid pain and is in a different location.   The history is provided by the patient.    Past Medical History:  Diagnosis Date  . Anemia   . Anxiety   . Cellulitis   . Chest pain   . Childhood asthma   . Chronic back pain    "all over" (05/29/2018)  . Complication of anesthesia    "difficult waking up " (03/30/2019)  . Depression   . Eczema   . Hypertension   . Scoliosis     Patient Active Problem List   Diagnosis Date Noted  . Generalized anxiety disorder 06/03/2018  . Uterine leiomyoma 06/03/2018  . Hyponatremia 06/03/2018  . Abdominal pain 05/29/2018  . Overweight 11/02/2016  . Muscle weakness (generalized)   . Syncope and collapse 02/04/2016  . Essential hypertension 07/14/2015  . Cervical spondylolysis 06/18/2014  .  Healthcare maintenance 12/10/2013  . Former smoker 12/10/2013  . GERD (gastroesophageal reflux disease) 12/10/2013  . Fatigue 09/09/2013  . Pain, joint, hand, right 05/20/2013  . Leg pain, bilateral 04/22/2012  . Dyshidrotic eczema 12/26/2011  . Back pain 12/05/2010  . ALLERGIC RHINITIS 08/20/2008  . Menorrhagia with regular cycle 03/20/2007  . MIGRAINE HEADACHE 02/20/2007  . Anxiety and depression 01/23/2007    Past Surgical History:  Procedure Laterality Date  . TUBAL LIGATION       OB History    Gravida  7   Para  6   Term  4   Preterm  2   AB  1   Living  6     SAB      TAB      Ectopic  1   Multiple      Live Births               Home Medications    Prior to Admission medications   Medication Sig Start Date End Date Taking? Authorizing Provider  acetaminophen (TYLENOL) 500 MG tablet Take 2 tablets (1,000 mg total) by mouth every 6 (six) hours as needed for mild pain or fever. 06/06/16   Leo Grosser, MD  amLODipine (NORVASC) 10 MG tablet Take 1 tablet (10 mg total) by mouth daily for 30 days. 06/17/18 07/17/18  Bonnita Hollow, MD  chlorthalidone (HYGROTON) 50 MG tablet Take 1 tablet (50 mg total) by mouth daily for 30 days. 06/17/18 07/17/18  Grandville Silos,  Corena Pilgrim, MD  DULoxetine (CYMBALTA) 60 MG capsule Take 1 capsule (60 mg total) by mouth daily. 06/18/18   Bonnita Hollow, MD  gabapentin (NEURONTIN) 600 MG tablet Take 0.5 tablets (300 mg total) by mouth 3 (three) times daily. 05/30/18   Anderson, Chelsey L, DO  lidocaine (LIDODERM) 5 % Place 1 patch onto the skin daily. Remove & Discard patch within 12 hours or as directed by MD Patient not taking: Reported on 06/18/2018 05/31/18   Doristine Mango L, DO  meloxicam (MOBIC) 15 MG tablet Take 1 tablet (15 mg total) by mouth daily for 30 days. 06/17/18 07/17/18  Bonnita Hollow, MD  Multiple Vitamins-Calcium (ONE-A-DAY WOMENS PO) Take 1 tablet by mouth daily.    [provider]  ondansetron (ZOFRAN ODT) 4  MG disintegrating tablet Take 1 tablet (4 mg total) by mouth every 8 (eight) hours as needed for nausea or vomiting. 06/24/18   Nils Flack, Anjel Pardo A, PA-C  polyethylene glycol (MIRALAX) packet Take 17 g by mouth daily. 06/01/18   Fransico Meadow, PA-C    Family History Family History  Problem Relation Age of Onset  . Hypertension Mother     Social History Social History   Tobacco Use  . Smoking status: Former Smoker    Packs/day: 0.50    Years: 28.00    Pack years: 14.00    Types: Cigarettes    Last attempt to quit: 09/20/2010    Years since quitting: 7.7  . Smokeless tobacco: Never Used  Substance Use Topics  . Alcohol use: Yes    Alcohol/week: 2.0 standard drinks    Types: 2 Cans of beer per week  . Drug use: No     Allergies   Latex; Amoxicillin; Naproxen; and Tramadol   Review of Systems Review of Systems  Constitutional: Negative for chills and fever.  Respiratory: Negative for shortness of breath.   Cardiovascular: Negative for chest pain.  Gastrointestinal: Positive for abdominal pain, nausea and vomiting. Negative for constipation and diarrhea.  Genitourinary: Negative for dysuria, frequency, hematuria, urgency, vaginal bleeding, vaginal discharge and vaginal pain.  All other systems reviewed and are negative.    Physical Exam Updated Vital Signs BP (!) 142/62 (BP Location: Right Arm)   Pulse 86   Temp 98.6 F (37 C)   Resp 16   LMP 06/09/2018   SpO2 100%   Physical Exam Vitals signs and nursing note reviewed.  Constitutional:      General: She is not in acute distress.    Appearance: She is well-developed.  HENT:     Head: Normocephalic and atraumatic.  Eyes:     General:        Right eye: No discharge.        Left eye: No discharge.     Conjunctiva/sclera: Conjunctivae normal.  Neck:     Vascular: No JVD.     Trachea: No tracheal deviation.  Cardiovascular:     Rate and Rhythm: Normal rate and regular rhythm.  Pulmonary:     Effort: Pulmonary  effort is normal.     Breath sounds: Normal breath sounds.  Abdominal:     General: Bowel sounds are normal. There is no distension.     Palpations: Abdomen is soft.     Tenderness: There is abdominal tenderness in the right upper quadrant, right lower quadrant, periumbilical area, suprapubic area and left lower quadrant. There is no guarding or rebound. Positive signs include Rovsing's sign, McBurney's sign and psoas sign. Negative  signs include Murphy's sign.  Genitourinary:    Comments: Deferred  Skin:    General: Skin is warm and dry.     Findings: No erythema.  Neurological:     Mental Status: She is alert.  Psychiatric:        Behavior: Behavior normal.      ED Treatments / Results  Labs (all labs ordered are listed, but only abnormal results are displayed) Labs Reviewed  COMPREHENSIVE METABOLIC PANEL - Abnormal; Notable for the following components:      Result Value   Sodium 134 (*)    Glucose, Bld 135 (*)    All other components within normal limits  CBC - Abnormal; Notable for the following components:   Hemoglobin 11.1 (*)    HCT 35.8 (*)    MCV 75.1 (*)    MCH 23.3 (*)    RDW 17.9 (*)    Platelets 543 (*)    All other components within normal limits  I-STAT BETA HCG BLOOD, ED ([facility], [facility], [facility] ONLY) - Abnormal; Notable for the following components:   I-stat hCG, quantitative 18.9 (*)    All other components within normal limits  I-STAT BETA HCG BLOOD, ED ([facility], [facility], [facility] ONLY) - Abnormal; Notable for the following components:   I-stat hCG, quantitative 16.6 (*)    All other components within normal limits  LIPASE, BLOOD  DIFFERENTIAL  URINALYSIS, ROUTINE W REFLEX MICROSCOPIC  POC URINE PREG, ED    EKG None  Radiology Ct Abdomen Pelvis W Contrast  Result Date: 06/24/2018 CLINICAL DATA:  Right lower quadrant abdominal pain and back pain. Ultrasound shows multiple uterine leiomyomas. EXAM: CT ABDOMEN AND PELVIS WITH CONTRAST TECHNIQUE: Multidetector CT imaging of  the abdomen and pelvis was performed using the standard protocol following bolus administration of intravenous contrast. CONTRAST:  181mL OMNIPAQUE IOHEXOL 300 MG/ML  SOLN COMPARISON:  Ultrasound 06/01/2018.  CT 11/10/2015. FINDINGS: Lower chest: Normal Hepatobiliary: 16 mm hypoenhancing focus within the right lobe of the liver, unchanged since June of 9163 in therefore certainly benign. No calcified gallstones. No other liver finding. Pancreas: Normal Spleen: Normal Adrenals/Urinary Tract: Adrenal glands are normal. Kidneys are normal. Bladder is normal. Stomach/Bowel: The stomach and small intestine are normal. No acute colon pathology is evident. There is a moderate amount of fecal matter within the right colon. The appendix is not visualized. There is certainly no evidence of any inflamed process in the region of the cecal tip. I wonder if the patient has had previous appendectomy. Vascular/Lymphatic: Aortic atherosclerosis. No aneurysm. IVC is normal. No retroperitoneal adenopathy. Reproductive: Multiple large uterine leiomyomas, the largest measuring 8 cm in diameter. Other: No free fluid or air. Musculoskeletal: Spinal curvature.  Degenerative disc disease L5-S1. IMPRESSION: No sign of acute appendicitis. I can not identify the appendix. There is no evidence of inflammatory process in the right lower quadrant. I wonder if the patient has had previous appendectomy. Chronic benign 16 mm low-density in the right lobe of the liver, unchanged since the CT of June 2017. Early aortic atherosclerosis. Moderate amount of fecal matter in the right colon which could possibly be symptomatic. Multiple large uterine leiomyomas as previously demonstrated. Electronically Signed   By: Nelson Chimes M.D.   On: 06/24/2018 14:03    Procedures Procedures (including critical care time)  Medications Ordered in ED Medications  sodium chloride flush (NS) 0.9 % injection 3 mL (0 mLs Intravenous Hold 06/24/18 0944)  ondansetron  (ZOFRAN-ODT) disintegrating tablet 4 mg (4 mg Oral  Given 06/24/18 0922)  HYDROmorphone (DILAUDID) injection 1 mg (1 mg Intravenous Given 06/24/18 1018)  sodium chloride 0.9 % bolus 1,000 mL (0 mLs Intravenous Stopped 06/24/18 1445)  HYDROmorphone (DILAUDID) injection 1 mg (1 mg Intravenous Given 06/24/18 1218)  iohexol (OMNIPAQUE) 300 MG/ML solution 100 mL (100 mLs Intravenous Contrast Given 06/24/18 1343)  ketorolac (TORADOL) 30 MG/ML injection 30 mg (30 mg Intravenous Given 06/24/18 1445)     Initial Impression / Assessment and Plan / ED Course  I have reviewed the triage vital signs and the nursing notes.  Pertinent labs & imaging results that were available during my care of the patient were reviewed by me and considered in my medical decision making (see chart for details).  Patient presenting for evaluation of right lower quadrant abdominal pain beginning yesterday.  She is afebrile, initially mildly tachycardic with improvement on reevaluation and somewhat hypertensive with improvement.  She is uncomfortable but nontoxic in appearance.  No peritoneal signs on examination of the abdomen.  She actually has diffuse tenderness on examination but worst in the right lower quadrant.  Concern for appendicitis given that she states this pain feels somewhat different from her usual abdominal pain.  Lab work reviewed by me shows no leukocytosis, stable anemia, no metabolic derangements.  LFTs, lipase creatinine within normal limits.  Her hCG was mildly elevated however recent pregnancy tests have all been negative and she has had a tubal ligation.  She reports that she is confident that she is not pregnant and wishes to proceed with CT scan.  Discussed effects of radiation.  CT scan shows no evidence of appendicitis.  She does have a moderate amount of fecal matter in the right colon which could possibly be causing her symptoms.  Her uterine leiomyomas appear stable.  No evidence of ovarian mass.  No evidence of  acute surgical abdominal pathology including obstruction, perforation, cholecystitis, or dissection.  Low suspicion of ectopic pregnancy, TOA, ovarian torsion however we did consider this as a possibility and we had a shared decision-making conversation regarding obtaining pelvic examination and pelvic ultrasound.  She reports that her pain has improved, serial abdominal examinations have been benign, and she is tolerating p.o. fluids without difficulty.  She would like to go home and call her OB/GYN for reevaluation and possible outpatient ultrasound.  I think this is reasonable as she appears more comfortable and is hemodynamically stable, but she does understand the risks of leaving without obtaining imaging..  We did discuss strict ED return precautions.  Will discharge with Zofran as needed for nausea and vomiting.  Recommend pushing fluids, advancing diet slowly.  She is in the process of scheduling hysterectomy for her pelvic pain.  Discussed strict ED return precautions.  Patient and mother verbalized understanding of and agreement with plan and patient stable for discharge home at this time.  Discussed with Dr. Maryan Rued who agrees with assessment and plan at this time. Final Clinical Impressions(s) / ED Diagnoses   Final diagnoses:  Right lower quadrant abdominal pain  Non-intractable vomiting with nausea, unspecified vomiting type    ED Discharge Orders         Ordered    ondansetron (ZOFRAN ODT) 4 MG disintegrating tablet  Every 8 hours PRN     06/24/18 1432           Renita Papa, PA-C 06/24/18 1531    Blanchie Dessert, MD 06/24/18 1556

## 2018-06-24 NOTE — Telephone Encounter (Signed)
Patient called reports was seen at the ER today due to right lower quad pain,( notes in epic) states the ER told her to call you for pain medication. Reports ER told her she has fibroids and they only gave her shot for pain at visit. She is not bleeding, states pain is constant. I explain I am not sure you will give medication without visit.  Please advise

## 2018-06-25 ENCOUNTER — Emergency Department (HOSPITAL_COMMUNITY): Payer: BLUE CROSS/BLUE SHIELD

## 2018-06-25 ENCOUNTER — Telehealth: Payer: Self-pay

## 2018-06-25 ENCOUNTER — Other Ambulatory Visit: Payer: Self-pay

## 2018-06-25 ENCOUNTER — Telehealth: Payer: Self-pay | Admitting: Physical Therapy

## 2018-06-25 ENCOUNTER — Emergency Department (HOSPITAL_COMMUNITY)
Admission: EM | Admit: 2018-06-25 | Discharge: 2018-06-25 | Disposition: A | Discharge status: Home / Self Care | Payer: BLUE CROSS/BLUE SHIELD | Source: Home / Self Care | Attending: Emergency Medicine | Admitting: Emergency Medicine

## 2018-06-25 ENCOUNTER — Ambulatory Visit: Payer: BLUE CROSS/BLUE SHIELD | Admitting: Physical Therapy

## 2018-06-25 ENCOUNTER — Encounter (HOSPITAL_COMMUNITY): Payer: Self-pay | Admitting: Emergency Medicine

## 2018-06-25 DIAGNOSIS — K5900 Constipation, unspecified: Secondary | ICD-10-CM | POA: Insufficient documentation

## 2018-06-25 DIAGNOSIS — I10 Essential (primary) hypertension: Secondary | ICD-10-CM

## 2018-06-25 DIAGNOSIS — R102 Pelvic and perineal pain: Secondary | ICD-10-CM

## 2018-06-25 DIAGNOSIS — N760 Acute vaginitis: Secondary | ICD-10-CM | POA: Insufficient documentation

## 2018-06-25 DIAGNOSIS — D259 Leiomyoma of uterus, unspecified: Secondary | ICD-10-CM | POA: Diagnosis not present

## 2018-06-25 DIAGNOSIS — Z91040 Latex allergy status: Secondary | ICD-10-CM

## 2018-06-25 DIAGNOSIS — Z87891 Personal history of nicotine dependence: Secondary | ICD-10-CM

## 2018-06-25 DIAGNOSIS — N390 Urinary tract infection, site not specified: Secondary | ICD-10-CM | POA: Insufficient documentation

## 2018-06-25 DIAGNOSIS — R1031 Right lower quadrant pain: Secondary | ICD-10-CM

## 2018-06-25 DIAGNOSIS — B9689 Other specified bacterial agents as the cause of diseases classified elsewhere: Secondary | ICD-10-CM | POA: Insufficient documentation

## 2018-06-25 DIAGNOSIS — Z79899 Other long term (current) drug therapy: Secondary | ICD-10-CM

## 2018-06-25 DIAGNOSIS — N186 End stage renal disease: Secondary | ICD-10-CM | POA: Diagnosis not present

## 2018-06-25 DIAGNOSIS — N946 Dysmenorrhea, unspecified: Secondary | ICD-10-CM

## 2018-06-25 LAB — URINALYSIS, ROUTINE W REFLEX MICROSCOPIC
Bilirubin Urine: NEGATIVE
Glucose, UA: NEGATIVE mg/dL
Ketones, ur: NEGATIVE mg/dL
NITRITE: NEGATIVE
Protein, ur: 100 mg/dL — AB
RBC / HPF: >50 RBC/hpf — ABNORMAL HIGH (ref 0–5)
Specific Gravity, Urine: 1.024 (ref 1.005–1.030)
pH: 5 (ref 5.0–8.0)

## 2018-06-25 LAB — COMPREHENSIVE METABOLIC PANEL
ALT: 16 U/L (ref 0–44)
AST: 18 U/L (ref 15–41)
Albumin: 4.1 g/dL (ref 3.5–5.0)
Alkaline Phosphatase: 63 U/L (ref 38–126)
Anion gap: 16 — ABNORMAL HIGH (ref 5–15)
BUN: 9 mg/dL (ref 6–20)
CO2: 24 mmol/L (ref 22–32)
Calcium: 9.7 mg/dL (ref 8.9–10.3)
Chloride: 92 mmol/L — ABNORMAL LOW (ref 98–111)
Creatinine, Ser: 0.85 mg/dL (ref 0.44–1.00)
GFR calc Af Amer: >60 mL/min (ref >60)
GFR calc non Af Amer: >60 mL/min (ref >60)
Glucose, Bld: 118 mg/dL — ABNORMAL HIGH (ref 70–99)
Potassium: 3.4 mmol/L — ABNORMAL LOW (ref 3.5–5.1)
Sodium: 132 mmol/L — ABNORMAL LOW (ref 135–145)
Total Bilirubin: 0.8 mg/dL (ref 0.3–1.2)
Total Protein: 8.7 g/dL — ABNORMAL HIGH (ref 6.5–8.1)

## 2018-06-25 LAB — WET PREP, GENITAL
Sperm: NONE SEEN
Trich, Wet Prep: NONE SEEN
Yeast Wet Prep HPF POC: NONE SEEN

## 2018-06-25 LAB — RAPID URINE DRUG SCREEN, HOSP PERFORMED
Amphetamines: NOT DETECTED
Barbiturates: NOT DETECTED
Benzodiazepines: NOT DETECTED
Cocaine: NOT DETECTED
Opiates: POSITIVE — AB
TETRAHYDROCANNABINOL: POSITIVE — AB

## 2018-06-25 LAB — I-STAT BETA HCG BLOOD, ED (MC, WL, AP ONLY): I-stat hCG, quantitative: 16.4 m[IU]/mL — ABNORMAL HIGH (ref <5)

## 2018-06-25 LAB — CBC
HEMATOCRIT: 38.2 % (ref 36.0–46.0)
Hemoglobin: 11.6 g/dL — ABNORMAL LOW (ref 12.0–15.0)
MCH: 22.6 pg — ABNORMAL LOW (ref 26.0–34.0)
MCHC: 30.4 g/dL (ref 30.0–36.0)
MCV: 74.5 fL — ABNORMAL LOW (ref 80.0–100.0)
Platelets: 500 10*3/uL — ABNORMAL HIGH (ref 150–400)
RBC: 5.13 MIL/uL — ABNORMAL HIGH (ref 3.87–5.11)
RDW: 17.2 % — AB (ref 11.5–15.5)
WBC: 13.7 10*3/uL — AB (ref 4.0–10.5)
nRBC: 0 % (ref 0.0–0.2)

## 2018-06-25 LAB — LIPASE, BLOOD: LIPASE: 29 U/L (ref 11–51)

## 2018-06-25 MED ORDER — MORPHINE SULFATE (PF) 4 MG/ML IV SOLN
4.0000 mg | Freq: Once | INTRAVENOUS | Status: AC
  Administered 2018-06-25: 4 mg via INTRAVENOUS
  Filled 2018-06-25: qty 1

## 2018-06-25 MED ORDER — DOCUSATE SODIUM 250 MG PO CAPS: 250.0000 mg | ORAL_CAPSULE | Freq: Every day | ORAL | 0 refills | Status: DC

## 2018-06-25 MED ORDER — POTASSIUM CHLORIDE CRYS ER 20 MEQ PO TBCR
40.0000 meq | EXTENDED_RELEASE_TABLET | Freq: Once | ORAL | Status: AC
  Administered 2018-06-25: 40 meq via ORAL
  Filled 2018-06-25: qty 2

## 2018-06-25 MED ORDER — NITROFURANTOIN MONOHYD MACRO 100 MG PO CAPS: 100.0000 mg | ORAL_CAPSULE | Freq: Two times a day (BID) | ORAL | 0 refills | Status: DC

## 2018-06-25 MED ORDER — HYDROMORPHONE HCL 1 MG/ML IJ SOLN
1.0000 mg | Freq: Once | INTRAMUSCULAR | Status: AC
  Administered 2018-06-25: 1 mg via INTRAVENOUS
  Filled 2018-06-25: qty 1

## 2018-06-25 MED ORDER — SODIUM CHLORIDE 0.9 % IV BOLUS
1000.0000 mL | Freq: Once | INTRAVENOUS | Status: AC
  Administered 2018-06-25: 1000 mL via INTRAVENOUS

## 2018-06-25 MED ORDER — MORPHINE SULFATE (PF) 4 MG/ML IV SOLN: 4.0000 mg | Freq: Once | INTRAVENOUS | Status: DC

## 2018-06-25 MED ORDER — SODIUM CHLORIDE 0.9 % IV BOLUS: 500.0000 mL | Freq: Once | INTRAVENOUS | Status: DC

## 2018-06-25 MED ORDER — METRONIDAZOLE 500 MG PO TABS: 500.0000 mg | ORAL_TABLET | Freq: Two times a day (BID) | ORAL | 0 refills | Status: DC

## 2018-06-25 MED ORDER — SODIUM CHLORIDE 0.9% FLUSH
3.0000 mL | Freq: Once | INTRAVENOUS | Status: AC
  Administered 2018-06-25: 3 mL via INTRAVENOUS

## 2018-06-25 NOTE — ED Provider Notes (Signed)
[facility] EMERGENCY DEPARTMENT Provider Note   CSN: [id number] Arrival date & time: 06/25/18  1036     History   Chief Complaint Chief Complaint  Patient presents with  . Abdominal Pain    HPI Susan Sanders is a 48 y.o. female with a past medical history of chronic back pain, hypertension who presents to ED for continued right lower quadrant abdominal pain for the past 2 to 3 days.  She has had similar symptoms for the past week.  She was seen and evaluated in the ED 24 hours ago with negative CT scan and reassuring lab work.  She followed up with her PCP last week and states that "they did not do anything for me."  She has been taking Zofran as needed with no improvement.  She reports nausea and several episodes of nonbloody, nonbilious emesis.  Has not had a bowel movement in 2 days.  She denies any urinary symptoms.  She notes that she restarted her menstrual cycle 2 to 3 days ago after already having it this month.  She denies any fever, vaginal discharge, sick contacts.  Prior abdominal surgeries include bilateral tubal ligation.  She denies any alcohol, tobacco or other drug use.  HPI  Past Medical History:  Diagnosis Date  . Anemia   . Anxiety   . Cellulitis   . Chest pain   . Childhood asthma   . Chronic back pain    "all over" (05/29/2018)  . Complication of anesthesia    "difficult waking up " (03/30/2019)  . Depression   . Eczema   . Hypertension   . Scoliosis     Patient Active Problem List   Diagnosis Date Noted  . Generalized anxiety disorder 06/03/2018  . Uterine leiomyoma 06/03/2018  . Hyponatremia 06/03/2018  . Abdominal pain 05/29/2018  . Overweight 11/02/2016  . Muscle weakness (generalized)   . Syncope and collapse 02/04/2016  . Essential hypertension 07/14/2015  . Cervical spondylolysis 06/18/2014  . Healthcare maintenance 12/10/2013  . Former smoker 12/10/2013  . GERD (gastroesophageal reflux disease) 12/10/2013  . Fatigue  09/09/2013  . Pain, joint, hand, right 05/20/2013  . Leg pain, bilateral 04/22/2012  . Dyshidrotic eczema 12/26/2011  . Back pain 12/05/2010  . ALLERGIC RHINITIS 08/20/2008  . Menorrhagia with regular cycle 03/20/2007  . MIGRAINE HEADACHE 02/20/2007  . Anxiety and depression 01/23/2007    Past Surgical History:  Procedure Laterality Date  . TUBAL LIGATION       OB History    Gravida  7   Para  6   Term  4   Preterm  2   AB  1   Living  6     SAB      TAB      Ectopic  1   Multiple      Live Births               Home Medications    Prior to Admission medications   Medication Sig Start Date End Date Taking? Authorizing Provider  acetaminophen (TYLENOL) 500 MG tablet Take 2 tablets (1,000 mg total) by mouth every 6 (six) hours as needed for mild pain or fever. 06/06/16   Leo Grosser, MD  amLODipine (NORVASC) 10 MG tablet Take 1 tablet (10 mg total) by mouth daily for 30 days. 06/17/18 07/17/18  Bonnita Hollow, MD  chlorthalidone (HYGROTON) 50 MG tablet Take 1 tablet (50 mg total) by mouth daily for 30 days. 06/17/18  07/17/18  Bonnita Hollow, MD  docusate sodium (COLACE) 250 MG capsule Take 1 capsule (250 mg total) by mouth daily. 06/25/18   Loyed Wilmes, PA-C  DULoxetine (CYMBALTA) 60 MG capsule Take 1 capsule (60 mg total) by mouth daily. 06/18/18   Bonnita Hollow, MD  gabapentin (NEURONTIN) 600 MG tablet Take 0.5 tablets (300 mg total) by mouth 3 (three) times daily. 05/30/18   Anderson, Chelsey L, DO  ibuprofen (ADVIL,MOTRIN) 800 MG tablet Take 1 tablet (800 mg total) by mouth 3 (three) times daily. 06/24/18   Fontaine, Belinda Block, MD  lidocaine (LIDODERM) 5 % Place 1 patch onto the skin daily. Remove & Discard patch within 12 hours or as directed by MD Patient not taking: Reported on 06/18/2018 05/31/18   Doristine Mango L, DO  meloxicam (MOBIC) 15 MG tablet Take 1 tablet (15 mg total) by mouth daily for 30 days. 06/17/18 07/17/18  Bonnita Hollow, MD    metroNIDAZOLE (FLAGYL) 500 MG tablet Take 1 tablet (500 mg total) by mouth 2 (two) times daily. 06/25/18   Montray Kliebert, PA-C  Multiple Vitamins-Calcium (ONE-A-DAY WOMENS PO) Take 1 tablet by mouth daily.    [provider]  nitrofurantoin, macrocrystal-monohydrate, (MACROBID) 100 MG capsule Take 1 capsule (100 mg total) by mouth 2 (two) times daily. 06/25/18   Abie Killian, PA-C  ondansetron (ZOFRAN ODT) 4 MG disintegrating tablet Take 1 tablet (4 mg total) by mouth every 8 (eight) hours as needed for nausea or vomiting. 06/24/18   Nils Flack, Mina A, PA-C  polyethylene glycol (MIRALAX) packet Take 17 g by mouth daily. 06/01/18   Fransico Meadow, PA-C    Family History Family History  Problem Relation Age of Onset  . Hypertension Mother     Social History Social History   Tobacco Use  . Smoking status: Former Smoker    Packs/day: 0.50    Years: 28.00    Pack years: 14.00    Types: Cigarettes    Last attempt to quit: 09/20/2010    Years since quitting: 7.7  . Smokeless tobacco: Never Used  Substance Use Topics  . Alcohol use: Yes    Alcohol/week: 2.0 standard drinks    Types: 2 Cans of beer per week  . Drug use: No     Allergies   Latex; Amoxicillin; Naproxen; and Tramadol   Review of Systems Review of Systems  Constitutional: Negative for appetite change, chills and fever.  HENT: Negative for ear pain, rhinorrhea, sneezing and sore throat.   Eyes: Negative for photophobia and visual disturbance.  Respiratory: Negative for cough, chest tightness, shortness of breath and wheezing.   Cardiovascular: Negative for chest pain and palpitations.  Gastrointestinal: Positive for abdominal pain, nausea and vomiting. Negative for blood in stool, constipation and diarrhea.  Genitourinary: Negative for dysuria, hematuria and urgency.  Musculoskeletal: Negative for myalgias.  Skin: Negative for rash.  Neurological: Negative for dizziness, weakness and light-headedness.      Physical Exam Updated Vital Signs BP (!) 155/80   Pulse 97   Temp 98.7 F (37.1 C) (Oral)   Resp 18   LMP 06/09/2018   SpO2 100%   Physical Exam Vitals signs and nursing note reviewed. Exam conducted with a chaperone present.  Constitutional:      General: She is not in acute distress.    Appearance: She is well-developed.  HENT:     Head: Normocephalic and atraumatic.     Nose: Nose normal.  Eyes:  General: No scleral icterus.       Left eye: No discharge.     Conjunctiva/sclera: Conjunctivae normal.  Neck:     Musculoskeletal: Normal range of motion and neck supple.  Cardiovascular:     Rate and Rhythm: Normal rate and regular rhythm.     Heart sounds: Normal heart sounds. No murmur. No friction rub. No gallop.   Pulmonary:     Effort: Pulmonary effort is normal. No respiratory distress.     Breath sounds: Normal breath sounds.  Abdominal:     General: Bowel sounds are normal. There is no distension.     Palpations: Abdomen is soft.     Tenderness: There is abdominal tenderness in the right lower quadrant. There is no guarding.  Genitourinary:    Vagina: Bleeding present.     Cervix: No cervical motion tenderness.     Adnexa:        Right: Tenderness present.      Comments: Pelvic exam: normal external genitalia without evidence of trauma. VULVA: normal appearing vulva with no masses, tenderness or lesion. VAGINA: normal appearing vagina with normal color and discharge, no lesions.  Small amount of vaginal bleeding noted in the vault. CERVIX: normal appearing cervix without lesions, cervical motion tenderness absent, cervical os closed with out purulent discharge; No vaginal discharge. Wet prep and DNA probe for chlamydia and GC obtained.   ADNEXA: normal adnexa in size, nontender and no masses UTERUS: uterus is normal size, shape, consistency and nontender.   Musculoskeletal: Normal range of motion.  Skin:    General: Skin is warm and dry.     Findings:  No rash.  Neurological:     Mental Status: She is alert.     Motor: No abnormal muscle tone.     Coordination: Coordination normal.      ED Treatments / Results  Labs (all labs ordered are listed, but only abnormal results are displayed) Labs Reviewed  WET PREP, GENITAL - Abnormal; Notable for the following components:      Result Value   Clue Cells Wet Prep HPF POC PRESENT (*)    WBC, Wet Prep HPF POC MODERATE (*)    All other components within normal limits  COMPREHENSIVE METABOLIC PANEL - Abnormal; Notable for the following components:   Sodium 132 (*)    Potassium 3.4 (*)    Chloride 92 (*)    Glucose, Bld 118 (*)    Total Protein 8.7 (*)    Anion gap 16 (*)    All other components within normal limits  CBC - Abnormal; Notable for the following components:   WBC 13.7 (*)    RBC 5.13 (*)    Hemoglobin 11.6 (*)    MCV 74.5 (*)    MCH 22.6 (*)    RDW 17.2 (*)    Platelets 500 (*)    All other components within normal limits  URINALYSIS, ROUTINE W REFLEX MICROSCOPIC - Abnormal; Notable for the following components:   Color, Urine AMBER (*)    APPearance CLOUDY (*)    Hgb urine dipstick LARGE (*)    Protein, ur 100 (*)    Leukocytes,Ua LARGE (*)    RBC / HPF >50 (*)    Bacteria, UA MANY (*)    All other components within normal limits  RAPID URINE DRUG SCREEN, HOSP PERFORMED - Abnormal; Notable for the following components:   Opiates POSITIVE (*)    Tetrahydrocannabinol POSITIVE (*)    All other components  within normal limits  I-STAT BETA HCG BLOOD, ED ([facility], [facility], [facility] ONLY) - Abnormal; Notable for the following components:   I-stat hCG, quantitative 16.4 (*)    All other components within normal limits  URINE CULTURE  LIPASE, BLOOD  GC/CHLAMYDIA PROBE AMP ([place]) NOT AT [facility]    EKG None  Radiology Ct Abdomen Pelvis W Contrast  Result Date: 06/24/2018 CLINICAL DATA:  Right lower quadrant abdominal pain and back pain. Ultrasound shows multiple uterine  leiomyomas. EXAM: CT ABDOMEN AND PELVIS WITH CONTRAST TECHNIQUE: Multidetector CT imaging of the abdomen and pelvis was performed using the standard protocol following bolus administration of intravenous contrast. CONTRAST:  167mL OMNIPAQUE IOHEXOL 300 MG/ML  SOLN COMPARISON:  Ultrasound 06/01/2018.  CT 11/10/2015. FINDINGS: Lower chest: Normal Hepatobiliary: 16 mm hypoenhancing focus within the right lobe of the liver, unchanged since June of 6967 in therefore certainly benign. No calcified gallstones. No other liver finding. Pancreas: Normal Spleen: Normal Adrenals/Urinary Tract: Adrenal glands are normal. Kidneys are normal. Bladder is normal. Stomach/Bowel: The stomach and small intestine are normal. No acute colon pathology is evident. There is a moderate amount of fecal matter within the right colon. The appendix is not visualized. There is certainly no evidence of any inflamed process in the region of the cecal tip. I wonder if the patient has had previous appendectomy. Vascular/Lymphatic: Aortic atherosclerosis. No aneurysm. IVC is normal. No retroperitoneal adenopathy. Reproductive: Multiple large uterine leiomyomas, the largest measuring 8 cm in diameter. Other: No free fluid or air. Musculoskeletal: Spinal curvature.  Degenerative disc disease L5-S1. IMPRESSION: No sign of acute appendicitis. I can not identify the appendix. There is no evidence of inflammatory process in the right lower quadrant. I wonder if the patient has had previous appendectomy. Chronic benign 16 mm low-density in the right lobe of the liver, unchanged since the CT of June 2017. Early aortic atherosclerosis. Moderate amount of fecal matter in the right colon which could possibly be symptomatic. Multiple large uterine leiomyomas as previously demonstrated. Electronically Signed   By: Nelson Chimes M.D.   On: 06/24/2018 14:03   US Pelvic Complete W Transvaginal And Torsion R/o  Result Date: 06/25/2018 CLINICAL DATA:  RIGHT lower  quadrant abdominal pain, history of tubal ligation and uterine fibroids EXAM: TRANSABDOMINAL AND TRANSVAGINAL ULTRASOUND OF PELVIS DOPPLER ULTRASOUND OF OVARIES TECHNIQUE: Both transabdominal and transvaginal ultrasound examinations of the pelvis were performed. Transabdominal technique was performed for global imaging of the pelvis including uterus, ovaries, adnexal regions, and pelvic cul-de-sac. It was necessary to proceed with endovaginal exam following the transabdominal exam to visualize the endometrium and LEFT ovary. Color and duplex Doppler ultrasound was utilized to evaluate blood flow to the ovaries. COMPARISON:  CT abdomen and pelvis 06/24/2018, ultrasound pelvis 06/01/2018 FINDINGS: Uterus Measurements: 14.3 x 7.7 x 9.1 cm = volume: 524 mL. Large posterior mass at upper uterine segment consistent with leiomyoma, slightly greater to RIGHT and extending submucosal, 7.7 x 6.7 x 7.4 cm. Additional anterior fundal intramural leiomyoma on LEFT 5.0 x 4.2 x 4.7 cm. Endometrium Thickness: 11 mm.  No endometrial fluid or focal abnormality Right ovary Measurements: 3.8 x 2.4 x 2.2 cm = volume: 10.5 mL. Normal morphology without mass. Internal blood flow present on color Doppler imaging. Left ovary Not visualized on either transabdominal or endovaginal imaging, likely obscured by bowel Pulsed Doppler evaluation of the RIGHT ovary demonstrates normal low-resistance arterial and venous waveforms. Other findings No free pelvic fluid.  No adnexal masses. IMPRESSION: Enlarged uterus containing  2 large upper uterine leiomyomata, the larger posterior of which extends submucosal. Normal appearing RIGHT ovary with nonvisualization of LEFT ovary as above. Electronically Signed   By: Lavonia Dana M.D.   On: 06/25/2018 15:14    Procedures Procedures (including critical care time)  Medications Ordered in ED Medications  sodium chloride 0.9 % bolus 500 mL (has no administration in time range)  sodium chloride flush (NS)  0.9 % injection 3 mL (3 mLs Intravenous Given 06/25/18 1247)  sodium chloride 0.9 % bolus 1,000 mL (0 mLs Intravenous Stopped 06/25/18 1349)  morphine 4 MG/ML injection 4 mg (4 mg Intravenous Given 06/25/18 1246)  potassium chloride SA (K-DUR,KLOR-CON) CR tablet 40 mEq (40 mEq Oral Given 06/25/18 1618)  HYDROmorphone (DILAUDID) injection 1 mg (1 mg Intravenous Given 06/25/18 1618)     Initial Impression / Assessment and Plan / ED Course  I have reviewed the triage vital signs and the nursing notes.  Pertinent labs & imaging results that were available during my care of the patient were reviewed by me and considered in my medical decision making (see chart for details).     48 year old female presents to ED for continued right lower quadrant abdominal pain for the past 2 to 3 days.  She has had similar symptoms for the past week which improved but then recurred.  She was seen and evaluated in the ED for the symptoms 24 hours ago with CT of the abdomen pelvis showing fibroids, large amount of stool.  She was unable to give a urine sample at that time.  She returns today because she continues to have pain and the 800 mg of ibuprofen prescribed by her PCP is not helping.  She reports nausea and vomiting.  Denies vaginal discharge, fever or urinary symptoms.  On my exam abdomen is tender in the right lower quadrant without rebound or guarding.  Her vital signs are within normal limits.  Reviewed work-up from yesterday which showed positive hCG at 55, but patient has a tubal ligation and states that she is not pregnant.  hCG is similarly elevated here today.  Leukocytosis of 13.7, anion gap of 16, potassium of 3.4 which is repleted orally.  UDS positive for THC, urinalysis shows leukocytes, many bacteria.  Pelvic ultrasound read demonstrates enlarged uterus with 2 large upper uterine fibroids.  Patient given pain medication and fluids here.  She continues to have pain so we will give more fluids, pain medication  and reassess.  Explained to the patient that her symptoms are most likely due to her UTI.  Wet prep shows clue cells.  Will treat with Flagyl, Macrobid and Colace for constipation.  Patient given second round of pain medication, oral potassium and fluids but she is requesting discharge.  We will have her follow-up with her PCP and return to ED for any severe worsening symptoms.  Patient is hemodynamically stable, in NAD, and able to ambulate in the ED. Evaluation does not show pathology that would require ongoing emergent intervention or inpatient treatment. I explained the diagnosis to the patient. Pain has been managed and has no complaints prior to discharge. Patient is comfortable with above plan and is stable for discharge at this time. All questions were answered prior to disposition. Strict return precautions for returning to the ED were discussed. Encouraged follow up with PCP.    Portions of this note were generated with [employer]. Dictation errors may occur despite best attempts at proofreading.   Final Clinical Impressions(s) /  ED Diagnoses   Final diagnoses:  Abdominal pain, RLQ  Lower urinary tract infectious disease  Bacterial vaginosis  Constipation, unspecified constipation type    ED Discharge Orders         Ordered    metroNIDAZOLE (FLAGYL) 500 MG tablet  2 times daily     06/25/18 1625    docusate sodium (COLACE) 250 MG capsule  Daily     06/25/18 1625    nitrofurantoin, macrocrystal-monohydrate, (MACROBID) 100 MG capsule  2 times daily     06/25/18 1625           Delia Heady, PA-C 06/25/18 1630    Hayden Rasmussen, MD 06/25/18 1801

## 2018-06-25 NOTE — ED Triage Notes (Signed)
Pt arrives by pov to ED after following up with PCp today and sent back to ED for further e val. Pt was seen yesterday and had blood work and CT scan for RLQ pain - pt had no signs of infectious process on cT and no concerning Blood work per PA note- pt back due to pain no better.

## 2018-06-25 NOTE — Telephone Encounter (Signed)
Patient calls asking for a referral to Dr Maudry Diego, [facility] as soon as possible.   [place], RN [facility] RN)

## 2018-06-25 NOTE — Telephone Encounter (Signed)
Patient called back in voice mail. I returned her call and got voice mail and left a message to call me when she can. She is still in ER from this morning.

## 2018-06-25 NOTE — Telephone Encounter (Signed)
I was going to call about missed visit until I noticed she was in the ED. No phone call today. Melvenia Needles PTA

## 2018-06-25 NOTE — ED Notes (Signed)
Pt would like a work note when discharged

## 2018-06-25 NOTE — ED Notes (Signed)
Patient transported to Ultrasound 

## 2018-06-25 NOTE — ED Notes (Signed)
Pt needed to use the restroom. Secretary tried to provide pt with a urine cup for urine specimen. Pt would not wait and stated, "I don't need your help."

## 2018-06-25 NOTE — Telephone Encounter (Signed)
Patient called in voice mail regarding scheduling surgery. I called her back and left message in her voice mail that I am holding next available date of 07/21/18 at 7:30am for her.  We need, also, to schedule pre op appt with TF week or so before surgery. I asked her to call me back and let me know if that date works for her.

## 2018-06-25 NOTE — Discharge Instructions (Addendum)
Take Flagyl for the next 7 days for bacterial vaginosis.  You will also need to take the antibiotics to help with your UTI. Continue MiraLAX for constipation. Follow-up with your primary care provider for further evaluation. You will need to follow-up with your gynecologist regarding your uterine fibroids.

## 2018-06-25 NOTE — Telephone Encounter (Signed)
Patient called nurse line, patient is yelling, crying and hard to understand but keeps saying her pain is unbearable.  Patient mother, Ardelle Park, got on line. Stated patient having intense abdominal/pelvic pain. Has been referred to GYN, Dr Phineas Real, and has known fibroids with surgery planned. She took patient to ED yesterday but pain is worse. States she "feels warm" and is "talking out of her head".  Asking for help to control pain.  Reviewed ED note and patient had left w/o US imaging but with strict return precautions. Based on patient's demeanor and mother's statement regarding possible fever and altered mental status, advised ED immediately. Patient mother agreeable. Note to PCP.  Danley Danker, RN [facility] RN)

## 2018-06-25 NOTE — ED Notes (Signed)
Patient verbalizes understanding of discharge instructions. Opportunity for questioning and answers were provided. Armband removed by staff, pt discharged from ED.  

## 2018-06-26 LAB — URINE CULTURE: Culture: NO GROWTH

## 2018-06-26 LAB — GC/CHLAMYDIA PROBE AMP (~~LOC~~) NOT AT ARMC
Chlamydia: NEGATIVE
Neisseria Gonorrhea: NEGATIVE

## 2018-06-26 NOTE — Telephone Encounter (Signed)
I called patient this morning to discuss surgery date. She was agreeable to this date. I will mail her a [facility] pamphlet. By her voice tone I could tell she was still in pain. I told her I saw she was in ER yesterday.  She said she was still in pain. I reminded her ER instruction was to follow up with PCP and they instructed her to return to ER if no improvement or worsening.

## 2018-06-27 ENCOUNTER — Ambulatory Visit
Admission: RE | Admit: 2018-06-27 | Discharge: 2018-06-27 | Disposition: A | Discharge status: Home / Self Care | Payer: BLUE CROSS/BLUE SHIELD | Source: Ambulatory Visit | Attending: Gynecology | Admitting: Gynecology

## 2018-06-27 ENCOUNTER — Other Ambulatory Visit: Payer: Self-pay | Admitting: Gynecology

## 2018-06-27 ENCOUNTER — Telehealth: Payer: Self-pay

## 2018-06-27 DIAGNOSIS — N6314 Unspecified lump in the right breast, lower inner quadrant: Secondary | ICD-10-CM | POA: Diagnosis not present

## 2018-06-27 DIAGNOSIS — R928 Other abnormal and inconclusive findings on diagnostic imaging of breast: Secondary | ICD-10-CM | POA: Diagnosis not present

## 2018-06-27 DIAGNOSIS — N6452 Nipple discharge: Secondary | ICD-10-CM

## 2018-06-27 DIAGNOSIS — N6310 Unspecified lump in the right breast, unspecified quadrant: Secondary | ICD-10-CM

## 2018-06-27 DIAGNOSIS — N6313 Unspecified lump in the right breast, lower outer quadrant: Secondary | ICD-10-CM | POA: Diagnosis not present

## 2018-06-27 NOTE — Addendum Note (Signed)
Addended by: Bonnita Hollow on: 06/27/2018 09:41 AM   Modules accepted: Orders

## 2018-06-27 NOTE — Telephone Encounter (Signed)
Pt called nurse line stating she needs a note for work, reflecting her absence from 2/6-2/22. Pt stated she has been "so sick." Pt was very hard to understand. Per chart review I did not see where she has been seen here for sickness recently. I informed patient I was not sure this would be able to be done. Will forward to PCP.

## 2018-06-30 ENCOUNTER — Ambulatory Visit: Payer: BLUE CROSS/BLUE SHIELD | Admitting: Physical Therapy

## 2018-07-01 ENCOUNTER — Encounter (HOSPITAL_COMMUNITY): Payer: Self-pay

## 2018-07-01 ENCOUNTER — Ambulatory Visit (INDEPENDENT_AMBULATORY_CARE_PROVIDER_SITE_OTHER): Payer: BLUE CROSS/BLUE SHIELD | Admitting: Family Medicine

## 2018-07-01 ENCOUNTER — Telehealth: Payer: Self-pay | Admitting: Family Medicine

## 2018-07-01 ENCOUNTER — Emergency Department (HOSPITAL_COMMUNITY)
Admission: EM | Admit: 2018-07-01 | Discharge: 2018-07-01 | Disposition: A | Discharge status: Home / Self Care | Payer: BLUE CROSS/BLUE SHIELD | Attending: Emergency Medicine | Admitting: Emergency Medicine

## 2018-07-01 ENCOUNTER — Emergency Department (HOSPITAL_COMMUNITY): Payer: BLUE CROSS/BLUE SHIELD

## 2018-07-01 ENCOUNTER — Encounter: Payer: Self-pay | Admitting: Family Medicine

## 2018-07-01 ENCOUNTER — Other Ambulatory Visit: Payer: Self-pay

## 2018-07-01 DIAGNOSIS — R1031 Right lower quadrant pain: Secondary | ICD-10-CM | POA: Diagnosis not present

## 2018-07-01 DIAGNOSIS — Z87891 Personal history of nicotine dependence: Secondary | ICD-10-CM | POA: Insufficient documentation

## 2018-07-01 DIAGNOSIS — N200 Calculus of kidney: Secondary | ICD-10-CM | POA: Diagnosis not present

## 2018-07-01 DIAGNOSIS — R1030 Lower abdominal pain, unspecified: Secondary | ICD-10-CM | POA: Diagnosis not present

## 2018-07-01 DIAGNOSIS — I10 Essential (primary) hypertension: Secondary | ICD-10-CM | POA: Insufficient documentation

## 2018-07-01 DIAGNOSIS — Z79899 Other long term (current) drug therapy: Secondary | ICD-10-CM | POA: Insufficient documentation

## 2018-07-01 LAB — COMPREHENSIVE METABOLIC PANEL
ALT: 11 U/L (ref 0–44)
AST: 18 U/L (ref 15–41)
Albumin: 3.5 g/dL (ref 3.5–5.0)
Alkaline Phosphatase: 65 U/L (ref 38–126)
Anion gap: 17 — ABNORMAL HIGH (ref 5–15)
BUN: 9 mg/dL (ref 6–20)
CO2: 26 mmol/L (ref 22–32)
Calcium: 9.9 mg/dL (ref 8.9–10.3)
Chloride: 88 mmol/L — ABNORMAL LOW (ref 98–111)
Creatinine, Ser: 0.72 mg/dL (ref 0.44–1.00)
GFR calc Af Amer: >60 mL/min (ref >60)
GFR calc non Af Amer: >60 mL/min (ref >60)
Glucose, Bld: 123 mg/dL — ABNORMAL HIGH (ref 70–99)
Potassium: 2.9 mmol/L — ABNORMAL LOW (ref 3.5–5.1)
Sodium: 131 mmol/L — ABNORMAL LOW (ref 135–145)
Total Bilirubin: 0.4 mg/dL (ref 0.3–1.2)
Total Protein: 8.5 g/dL — ABNORMAL HIGH (ref 6.5–8.1)

## 2018-07-01 LAB — URINALYSIS, ROUTINE W REFLEX MICROSCOPIC
Bacteria, UA: NONE SEEN
Bilirubin Urine: NEGATIVE
Glucose, UA: NEGATIVE mg/dL
Ketones, ur: NEGATIVE mg/dL
Leukocytes,Ua: NEGATIVE
Nitrite: NEGATIVE
Protein, ur: NEGATIVE mg/dL
Specific Gravity, Urine: 1.006 (ref 1.005–1.030)
pH: 7 (ref 5.0–8.0)

## 2018-07-01 LAB — RAPID URINE DRUG SCREEN, HOSP PERFORMED
Amphetamines: NOT DETECTED
Barbiturates: NOT DETECTED
Benzodiazepines: NOT DETECTED
Cocaine: NOT DETECTED
Opiates: POSITIVE — AB
Tetrahydrocannabinol: POSITIVE — AB

## 2018-07-01 LAB — CBC
HCT: 37.2 % (ref 36.0–46.0)
HEMOGLOBIN: 11.4 g/dL — AB (ref 12.0–15.0)
MCH: 22.7 pg — ABNORMAL LOW (ref 26.0–34.0)
MCHC: 30.6 g/dL (ref 30.0–36.0)
MCV: 74 fL — ABNORMAL LOW (ref 80.0–100.0)
Platelets: 561 10*3/uL — ABNORMAL HIGH (ref 150–400)
RBC: 5.03 MIL/uL (ref 3.87–5.11)
RDW: 16.4 % — ABNORMAL HIGH (ref 11.5–15.5)
WBC: 11 10*3/uL — ABNORMAL HIGH (ref 4.0–10.5)
nRBC: 0 % (ref 0.0–0.2)

## 2018-07-01 LAB — I-STAT BETA HCG BLOOD, ED (MC, WL, AP ONLY): HCG, QUANTITATIVE: 17.4 m[IU]/mL — AB (ref <5)

## 2018-07-01 LAB — LIPASE, BLOOD: Lipase: 26 U/L (ref 11–51)

## 2018-07-01 MED ORDER — FENTANYL CITRATE (PF) 100 MCG/2ML IJ SOLN
25.0000 ug | Freq: Once | INTRAMUSCULAR | Status: AC
  Administered 2018-07-01: 25 ug via INTRAVENOUS
  Filled 2018-07-01: qty 2

## 2018-07-01 MED ORDER — SODIUM CHLORIDE 0.9 % IV BOLUS
1000.0000 mL | Freq: Once | INTRAVENOUS | Status: AC
  Administered 2018-07-01: 1000 mL via INTRAVENOUS

## 2018-07-01 MED ORDER — SODIUM CHLORIDE 0.9% FLUSH
3.0000 mL | Freq: Once | INTRAVENOUS | Status: AC
  Administered 2018-07-01: 3 mL via INTRAVENOUS

## 2018-07-01 MED ORDER — POTASSIUM CHLORIDE CRYS ER 20 MEQ PO TBCR
40.0000 meq | EXTENDED_RELEASE_TABLET | Freq: Once | ORAL | Status: AC
  Administered 2018-07-01: 40 meq via ORAL
  Filled 2018-07-01: qty 2

## 2018-07-01 MED ORDER — FENTANYL CITRATE (PF) 100 MCG/2ML IJ SOLN
25.0000 ug | Freq: Once | INTRAMUSCULAR | Status: AC
  Administered 2018-07-01: 25 ug via INTRAVENOUS

## 2018-07-01 NOTE — ED Triage Notes (Signed)
Pt sent here by PCP to r/o appendicitis. Pt reports she has been having lower abdominal pain X2 weeks. States LMP started 1 week ago, still bleeding. Pt denies nausea or vomiting. Mildly tachycardic in triage.

## 2018-07-01 NOTE — Assessment & Plan Note (Signed)
Focal RLQ.  Recent imaging unrevealing (did not see appendix) but patient today very sedated unable to walk without assistance and with peritoneal signs.  Unsure of cause but needs stat labs and perhaps more imaging.  She may have only constipation and uterine fibroids but unsure why is presenting with sedation,

## 2018-07-01 NOTE — Telephone Encounter (Signed)
Patient wanted you to call her personally with some questions, as she says you saw her in the hospital.  [phone]. [place] [phone]

## 2018-07-01 NOTE — Patient Instructions (Signed)
  I am sorry you are having so much pain.  I think you need further tests to make sure you do not have appendicitis and to find out why you are so sleepy  You should go to the ER.   I let them know you are coming

## 2018-07-01 NOTE — Telephone Encounter (Signed)
Called patient.  She wanted to tell me that she was still in considerable pain and asked me about a note for work.  She is worried that she may lose her job and lost the original documentation of her hospitalization.  I agreed to provide that note (since there seemed to be some time urgency about her job) and steered her back to her PCP for ongoing questions.

## 2018-07-01 NOTE — Progress Notes (Signed)
Subjective  Susan Sanders is a 48 y.o. female is presenting with the following  ABDOMINAL PAIN AND WEAKNESS Brought in by her Mom.  Patient able to speak and is oriented but slumped over in chair.  Has had mainly R sided abdomen pain for weeks. Prior work up in [place] with CT and [place] a week ago not revealing.  Recently treated for UTI.  She has not had a bowel movement for a week and has tried enema and miralax, Feels very tired and "out of it"  Only sedating medications is a flexaril she took this AM.    No vomiting or fever.  Did not bring her medications    Chief Complaint noted Review of Symptoms - see HPI PMH - Smoking status noted.  Upreg has been negative.  Is having menstrual periods now   Objective Vital Signs reviewed BP (!) 150/82   Pulse (!) 128   Temp 99 F (37.2 C) (Oral)   Ht 5\' 4"  (1.626 m)   Wt 180 lb (81.6 kg)   LMP 06/24/2018   SpO2 99%   BMI 30.90 kg/m  Patient able to speak and is oriented but slumped over in chair.  When asked to stand stumbles and almost falls.  Needs assistance get on exam table and had to leave in a WC Abdomen - very tender in RLQ with rebound.  Midly tender all over without masses appreciated Back - Tender over R lower paraspinous muscles without specific CVAT  Heart - Regular rate and rhythm.  No murmurs, gallops or rubs.    Lungs:  Normal respiratory effort, chest expands symmetrically. Lungs are clear to auscultation, no crackles or wheezes. Skin:  Intact without suspicious lesions or rashes Neck:  No deformities, thyromegaly, masses, or tenderness noted.   Supple with full range of motion without pain.  Assessments/Plans  See after visit summary for details of patient instuctions  Abdominal pain Focal RLQ.  Recent imaging unrevealing (did not see appendix) but patient today very sedated unable to walk without assistance and with peritoneal signs.  Unsure of cause but needs stat labs and perhaps more imaging.  She may have only  constipation and uterine fibroids but unsure why is presenting with sedation,    Patient sent to ER with her mom.  Report called

## 2018-07-01 NOTE — ED Provider Notes (Signed)
[facility] EMERGENCY DEPARTMENT Provider Note   CSN: [id number] Arrival date & time: 07/01/18  1025    History   Chief Complaint Chief Complaint  Patient presents with  . Abdominal Pain    HPI Susan Sanders is a 48 y.o. female.     48 y.o female with a PMH of Anemia,HTN presents to the ED with a chief complaint of right lower quadrant pain x 1 month. Patient was seen in the ED 6 days ago and had CT Abd and US pelvic which showed no abnormalities. She reports the pain on her right lower quadrant has not improved, she reports a "pulling" sensation on the right lower side with radiation across her lower abdominal region. She also reports some leg weakness and constipation. She has taken Miralax but reports no improvement, her last bowel movement was yesterday. She reports taking flexeril this morning and ibuprofen 800 mg but reports no improvement in symptoms. Patient was seen by PCP today who instructed her to be seen in the ED for sedation and right lower quadrant abdominal pain.  She denies any fever but does endorses some chills, no nausea or emesis.No chest pain or shortness of breath.       Past Medical History:  Diagnosis Date  . Anemia   . Anxiety   . Cellulitis   . Chest pain   . Childhood asthma   . Chronic back pain    "all over" (05/29/2018)  . Complication of anesthesia    "difficult waking up " (03/30/2019)  . Depression   . Eczema   . Hypertension   . Scoliosis     Patient Active Problem List   Diagnosis Date Noted  . Generalized anxiety disorder 06/03/2018  . Uterine leiomyoma 06/03/2018  . Hyponatremia 06/03/2018  . Abdominal pain 05/29/2018  . Overweight 11/02/2016  . Muscle weakness (generalized)   . Syncope and collapse 02/04/2016  . Essential hypertension 07/14/2015  . Cervical spondylolysis 06/18/2014  . Healthcare maintenance 12/10/2013  . Former smoker 12/10/2013  . GERD (gastroesophageal reflux disease) 12/10/2013  .  Fatigue 09/09/2013  . Pain, joint, hand, right 05/20/2013  . Leg pain, bilateral 04/22/2012  . Dyshidrotic eczema 12/26/2011  . Back pain 12/05/2010  . ALLERGIC RHINITIS 08/20/2008  . Menorrhagia with regular cycle 03/20/2007  . MIGRAINE HEADACHE 02/20/2007  . Anxiety and depression 01/23/2007    Past Surgical History:  Procedure Laterality Date  . TUBAL LIGATION       OB History    Gravida  7   Para  6   Term  4   Preterm  2   AB  1   Living  6     SAB      TAB      Ectopic  1   Multiple      Live Births               Home Medications    Prior to Admission medications   Medication Sig Start Date End Date Taking? Authorizing Provider  acetaminophen (TYLENOL) 500 MG tablet Take 2 tablets (1,000 mg total) by mouth every 6 (six) hours as needed for mild pain or fever. 06/06/16   Leo Grosser, MD  amLODipine (NORVASC) 10 MG tablet Take 1 tablet (10 mg total) by mouth daily for 30 days. 06/17/18 07/17/18  Bonnita Hollow, MD  chlorthalidone (HYGROTON) 50 MG tablet Take 1 tablet (50 mg total) by mouth daily for 30 days. 06/17/18 07/17/18  Bonnita Hollow, MD  docusate sodium (COLACE) 250 MG capsule Take 1 capsule (250 mg total) by mouth daily. 06/25/18   Khatri, Hina, PA-C  DULoxetine (CYMBALTA) 60 MG capsule Take 1 capsule (60 mg total) by mouth daily. 06/18/18   Bonnita Hollow, MD  gabapentin (NEURONTIN) 600 MG tablet Take 0.5 tablets (300 mg total) by mouth 3 (three) times daily. 05/30/18   Anderson, Chelsey L, DO  ibuprofen (ADVIL,MOTRIN) 800 MG tablet Take 1 tablet (800 mg total) by mouth 3 (three) times daily. 06/24/18   Fontaine, Belinda Block, MD  lidocaine (LIDODERM) 5 % Place 1 patch onto the skin daily. Remove & Discard patch within 12 hours or as directed by MD Patient not taking: Reported on 06/18/2018 05/31/18   Doristine Mango L, DO  meloxicam (MOBIC) 15 MG tablet Take 1 tablet (15 mg total) by mouth daily for 30 days. 06/17/18 07/17/18  Bonnita Hollow, MD    metroNIDAZOLE (FLAGYL) 500 MG tablet Take 1 tablet (500 mg total) by mouth 2 (two) times daily. 06/25/18   Khatri, Hina, PA-C  Multiple Vitamins-Calcium (ONE-A-DAY WOMENS PO) Take 1 tablet by mouth daily.    [provider]  nitrofurantoin, macrocrystal-monohydrate, (MACROBID) 100 MG capsule Take 1 capsule (100 mg total) by mouth 2 (two) times daily. 06/25/18   Khatri, Hina, PA-C  ondansetron (ZOFRAN ODT) 4 MG disintegrating tablet Take 1 tablet (4 mg total) by mouth every 8 (eight) hours as needed for nausea or vomiting. 06/24/18   Nils Flack, Mina A, PA-C  polyethylene glycol (MIRALAX) packet Take 17 g by mouth daily. 06/01/18   Fransico Meadow, PA-C    Family History Family History  Problem Relation Age of Onset  . Hypertension Mother     Social History Social History   Tobacco Use  . Smoking status: Former Smoker    Packs/day: 0.50    Years: 28.00    Pack years: 14.00    Types: Cigarettes    Last attempt to quit: 09/20/2010    Years since quitting: 7.7  . Smokeless tobacco: Never Used  Substance Use Topics  . Alcohol use: Yes    Alcohol/week: 2.0 standard drinks    Types: 2 Cans of beer per week  . Drug use: No     Allergies   Latex; Amoxicillin; Naproxen; and Tramadol   Review of Systems Review of Systems  Constitutional: Negative for chills and fever.  HENT: Negative for ear pain and sore throat.   Eyes: Negative for pain and visual disturbance.  Respiratory: Negative for cough and shortness of breath.   Cardiovascular: Negative for chest pain and palpitations.  Gastrointestinal: Positive for abdominal pain and constipation. Negative for diarrhea, nausea and vomiting.  Genitourinary: Positive for vaginal bleeding. Negative for dysuria and hematuria.  Musculoskeletal: Negative for arthralgias and back pain.  Skin: Negative for color change and rash.  Neurological: Negative for seizures and syncope.  All other systems reviewed and are negative.    Physical  Exam Updated Vital Signs BP (!) 144/84 (BP Location: Left Arm)   Pulse (!) 108   Temp 98.3 F (36.8 C) (Oral)   Resp 20   LMP 06/24/2018   SpO2 100%   Physical Exam Vitals signs and nursing note reviewed.  Constitutional:      General: She is not in acute distress.    Appearance: She is well-developed.  HENT:     Head: Normocephalic and atraumatic.     Mouth/Throat:     Pharynx: No  oropharyngeal exudate.  Eyes:     Pupils: Pupils are equal, round, and reactive to light.  Neck:     Musculoskeletal: Normal range of motion.  Cardiovascular:     Rate and Rhythm: Regular rhythm.     Heart sounds: Normal heart sounds.  Pulmonary:     Effort: Pulmonary effort is normal. No respiratory distress.     Breath sounds: Normal breath sounds.  Abdominal:     General: Bowel sounds are normal. There is no distension.     Palpations: Abdomen is soft.     Tenderness: There is generalized abdominal tenderness and tenderness in the right lower quadrant, suprapubic area and left lower quadrant. There is no right CVA tenderness or left CVA tenderness. Positive signs include McBurney's sign.  Musculoskeletal:        General: No tenderness or deformity.     Right lower leg: No edema.     Left lower leg: No edema.  Skin:    General: Skin is warm and dry.  Neurological:     Mental Status: She is alert and oriented to person, place, and time.      ED Treatments / Results  Labs (all labs ordered are listed, but only abnormal results are displayed) Labs Reviewed  COMPREHENSIVE METABOLIC PANEL - Abnormal; Notable for the following components:      Result Value   Sodium 131 (*)    Potassium 2.9 (*)    Chloride 88 (*)    Glucose, Bld 123 (*)    Total Protein 8.5 (*)    Anion gap 17 (*)    All other components within normal limits  CBC - Abnormal; Notable for the following components:   WBC 11.0 (*)    Hemoglobin 11.4 (*)    MCV 74.0 (*)    MCH 22.7 (*)    RDW 16.4 (*)    Platelets 561  (*)    All other components within normal limits  URINALYSIS, ROUTINE W REFLEX MICROSCOPIC - Abnormal; Notable for the following components:   Hgb urine dipstick MODERATE (*)    All other components within normal limits  RAPID URINE DRUG SCREEN, HOSP PERFORMED - Abnormal; Notable for the following components:   Opiates POSITIVE (*)    Tetrahydrocannabinol POSITIVE (*)    All other components within normal limits  I-STAT BETA HCG BLOOD, ED ([facility], [facility], [facility] ONLY) - Abnormal; Notable for the following components:   I-stat hCG, quantitative 17.4 (*)    All other components within normal limits  LIPASE, BLOOD    EKG None  Radiology Ct Renal Stone Study  Result Date: 07/01/2018 CLINICAL DATA:  Right-sided flank pain EXAM: CT ABDOMEN AND PELVIS WITHOUT CONTRAST TECHNIQUE: Multidetector CT imaging of the abdomen and pelvis was performed following the standard protocol without IV contrast. COMPARISON:  CT 06/24/2018, 11/10/2015 FINDINGS: Lower chest: Lung bases demonstrate no acute consolidation or effusion. Heart size is normal. Small hiatal hernia. Hepatobiliary: Hypodensity in the right hepatic lobe better seen with contrast. No calcified gallstone or biliary dilatation Pancreas: Unremarkable. No pancreatic ductal dilatation or surrounding inflammatory changes. Spleen: Normal in size without focal abnormality. Adrenals/Urinary Tract: Adrenal glands are unremarkable. Kidneys are without hydronephrosis. Punctate stone in the lower pole of the right kidney. Stomach/Bowel: Stomach is within normal limits. Appendix not well seen but no right lower quadrant inflammatory process. No evidence of bowel wall thickening, distention, or inflammatory changes. Vascular/Lymphatic: Nonaneurysmal aorta. No significantly enlarged lymph nodes Reproductive: Multiple uterine masses.  No adnexal  mass Other: Negative for free air or free fluid Musculoskeletal: Scoliosis.  No acute or suspicious abnormality IMPRESSION: 1.  Punctate nonobstructing stone in the right kidney. No hydronephrosis or ureteral stone. 2. Fibroid uterus Electronically Signed   By: Donavan Foil M.D.   On: 07/01/2018 16:12    Procedures Procedures (including critical care time)  Medications Ordered in ED Medications  sodium chloride flush (NS) 0.9 % injection 3 mL (3 mLs Intravenous Given 07/01/18 1557)  sodium chloride 0.9 % bolus 1,000 mL (1,000 mLs Intravenous New Bag/Given 07/01/18 1557)  fentaNYL (SUBLIMAZE) injection 25 mcg (25 mcg Intravenous Given 07/01/18 1537)  potassium chloride SA (K-DUR,KLOR-CON) CR tablet 40 mEq (40 mEq Oral Given 07/01/18 1347)  fentaNYL (SUBLIMAZE) injection 25 mcg (25 mcg Intravenous Given 07/01/18 1753)     Initial Impression / Assessment and Plan / ED Course  I have reviewed the triage vital signs and the nursing notes.  Pertinent labs & imaging results that were available during my care of the patient were reviewed by me and considered in my medical decision making (see chart for details).       Patient sent by PCP, with worsening right lower quadrant pain.  She was CT scan last week and her scan revealed no signs of appendicitis, they were unable to visualize the appendix.  According to PCP patient seen more sedated today during his original evaluation.  She was sent in for further work-up along with ruling out appendicitis.  Also risks of radiation with patient, will order CT imaging to r/o any acute process. A UDS due to patient's sedation, positive for opiates, THC, she also reports taking flexeril this morning prior to her appointment for her pain.  CMP showed a decreased sodium, decrease in potassium at 2.9, will replace this for patient as she reports some leg pains and weakness.  UA showed moderate hemoglobin, she currently reports she is on her cycle and has been bleeding for the past 2 weeks.  Lipase was within normal limits.  CBC showed slight increase in her white blood cell count, hemoglobin  is decreased at 11.4, consistent with patient's previous visits.  CT renal study showed:   Patient is scheduled for a breast biopsy tomorrow by Dr. Donalynn Furlong.She has been a sleep during her visit, received fentanyl for her pain with improvement, family at the bedside.  CT Renal stone ordered for different timing study showed:  1. Punctate nonobstructing stone in the right kidney. No  hydronephrosis or ureteral stone.  2. Fibroid uterus   6:16 PM Patient was informed of her results HR has improved with fluids. I suspect patient pain is likely coming from her fibroids, reports having a prescription for hydrocodone along with Flexeril at home which she has been taking.  Patient's potassium has been replaced. She has been instructed to not mix this medication with THC.  Patient voices understanding, she will follow-up with PCP as needed.  Afebrile, vitals stable for discharge.  Return precautions provided. Final Clinical Impressions(s) / ED Diagnoses   Final diagnoses:  Lower abdominal pain    ED Discharge Orders    None       Janeece Fitting, PA-C 07/01/18 [place], [place], DO 07/02/18 [phone]

## 2018-07-01 NOTE — Discharge Instructions (Signed)
You CT today was normal,it did not show appendicitis. Please continue to take the medication that was provided for you during your last visit for pain.You may also take aleve for your symptoms. Follow up with your primary care physician as needed.

## 2018-07-02 ENCOUNTER — Ambulatory Visit
Admission: RE | Admit: 2018-07-02 | Discharge: 2018-07-02 | Disposition: A | Discharge status: Home / Self Care | Payer: BLUE CROSS/BLUE SHIELD | Source: Ambulatory Visit | Attending: Gynecology | Admitting: Gynecology

## 2018-07-02 ENCOUNTER — Ambulatory Visit: Payer: BLUE CROSS/BLUE SHIELD | Admitting: Physical Therapy

## 2018-07-02 DIAGNOSIS — N6452 Nipple discharge: Secondary | ICD-10-CM

## 2018-07-02 DIAGNOSIS — D241 Benign neoplasm of right breast: Secondary | ICD-10-CM | POA: Diagnosis not present

## 2018-07-02 DIAGNOSIS — N6315 Unspecified lump in the right breast, overlapping quadrants: Secondary | ICD-10-CM | POA: Diagnosis not present

## 2018-07-02 DIAGNOSIS — N6310 Unspecified lump in the right breast, unspecified quadrant: Secondary | ICD-10-CM

## 2018-07-07 ENCOUNTER — Ambulatory Visit: Payer: BLUE CROSS/BLUE SHIELD | Admitting: Physical Therapy

## 2018-07-07 ENCOUNTER — Telehealth: Payer: Self-pay | Admitting: Physical Therapy

## 2018-07-07 NOTE — Telephone Encounter (Signed)
Pt no show for PT appointment today for 3rd time. They where contacted and informed of this and due to attendance policy their remaining scheduled visits will be cancelled. They where given number to call front office to reschedule if they feel they need PT.  Elsie Ra, PT, DPT 07/07/18 9:57 AM

## 2018-07-08 ENCOUNTER — Ambulatory Visit (INDEPENDENT_AMBULATORY_CARE_PROVIDER_SITE_OTHER): Payer: BLUE CROSS/BLUE SHIELD | Admitting: Gynecology

## 2018-07-08 ENCOUNTER — Encounter: Payer: Self-pay | Admitting: Gynecology

## 2018-07-08 VITALS — BP 130/82

## 2018-07-08 DIAGNOSIS — D500 Iron deficiency anemia secondary to blood loss (chronic): Secondary | ICD-10-CM

## 2018-07-08 DIAGNOSIS — D259 Leiomyoma of uterus, unspecified: Secondary | ICD-10-CM

## 2018-07-08 DIAGNOSIS — N920 Excessive and frequent menstruation with regular cycle: Secondary | ICD-10-CM

## 2018-07-08 NOTE — H&P (Signed)
Susan Sanders [birth year] [id number]   History and Physical  Chief complaint: Menorrhagia, leiomyoma, iron deficiency anemia  History of present illness: 48 y.o. [id number] with a history of heavy menses requiring 2 pads and a tampon still with bleedthrough episodes.  Significant cramping and discomfort with her menses accelerating over time.  Ultrasound shows multiple myomas.  Right and left ovaries not visualized.  Endometrial echo thin at 2 mm.  Hemoglobin 10.2.  Now on iron with most recent hemoglobin 11.4.  Recent Pap smear negative.  Ultrasound 2010 review showed no myomas.  Follow-up ultrasound 2015 showed several small myomas.  Options for management as discussed below reviewed and she elects for TAH/BSO.  Status post BTL in the past  Past Medical History:  Diagnosis Date  . Anemia   . Anxiety   . Cellulitis   . Chest pain   . Childhood asthma   . Chronic back pain    "all over" (05/29/2018)  . Complication of anesthesia    "difficult waking up " (03/30/2019)  . Depression   . Eczema   . Hypertension   . Scoliosis     Past Surgical History:  Procedure Laterality Date  . TUBAL LIGATION      Family History  Problem Relation Age of Onset  . Hypertension Mother     Social History:  reports that she quit smoking about 7 years ago. Her smoking use included cigarettes. She has a 14.00 pack-year smoking history. She has never used smokeless tobacco. She reports current alcohol use of about 2.0 standard drinks of alcohol per week. She reports that she does not use drugs.  Allergies:  Allergies  Allergen Reactions  . Latex Shortness Of Breath    Itching and burns skin  . Amoxicillin Itching and Swelling    Has patient had a PCN reaction causing immediate rash, facial/tongue/throat swelling, SOB or lightheadedness with hypotension: Yes Has patient had a PCN reaction causing severe rash involving mucus membranes or skin necrosis: Yes Has patient had a PCN reaction that required  hospitalization: Yes Has patient had a PCN reaction occurring within the last 10 years: Yes If all of the above answers are "NO", then may proceed with Cephalosporin use.   . Naproxen Hives and Itching    Takes IBU without difficulty  . Tramadol Hives and Itching    Takes IBU without difficulty    Medications: See Epic for the most current list of medications.  ROS:  Was performed and pertinent positives and negatives are included in the history of present illness.  Exam:  [name] assistant Vitals:   07/08/18 0928  BP: 130/82   General appearance:  Normal HEENT normal Lungs clear Cardiac regular rate no rubs murmurs gallops Abdomen soft nontender without masses guarding rebound Pelvic external BUS vagina with small right Bartholin gland cyst.  Cervix normal.  Uterus bulky at 16 weeks size filling the pelvis.  Adnexa unable to evaluate due to the bulk of the uterus.    Assessment/Plan:  48 y.o. [id number] with worsening menses and heavy bleeding requiring multiple protection and bleedthrough episodes as well as increasing pain with her menses.  Options for management were reviewed to include hormonal manipulation to include trial of progesterone suppression or Depo-Lupron, endometrial ablation, Mirena IUD, uterine artery embolization, myomectomy and hysterectomy.  Approaches with hysterectomy to include trial of laparoscopic, robotic or open procedure were reviewed.  Possibilities of converting a laparoscopic/robotic to an open case intraoperatively also discussed.  Absolute irreversible sterility with  hysterectomy reviewed.  Sexuality following hysterectomy was also discussed with the potential for persistent dyspareunia or orgasmic dysfunction.  The ovarian conservation issue was reviewed and the options to keep both ovaries or remove them discussed.  Benefits of ongoing hormone production versus risks of ovarian disease in the future and the potential need for HRT all discussed.   The issues with HRT to include thrombosis as well as breast cancer also discussed.  At this point the patient wants both ovaries removed and would accept HRT if needed.  The expected intraoperative and postoperative courses as well as the recovery period were reviewed. The risks of infection, prolonged antibiotics, reoperation for abscess or hematoma formation was discussed. The risks of hemorrhage necessitating transfusion and the risks of transfusion reaction, hepatitis, HIV, mad cow disease and other unknown entities was also discussed. Incisional complications to include opening and draining of incisions and closure by secondary intention, dehiscence and long-term issues of keloid/cosmetics and hernia formation were reviewed. The risk of inadvertent injury to internal organs including bowel, bladder, ureters, vessels, nerves either immediately recognized or delay recognized necessitating major exploratory reparative surgeries and future reparative surgeries including bowel resection, ostomy formation, bladder repair, ureteral damage repair was discussed with her. The patient's questions were answered to her satisfaction and she is ready to proceed with surgery.    Anastasio Auerbach MD, 10:21 AM 07/08/2018

## 2018-07-08 NOTE — Progress Notes (Signed)
    Susan Sanders Apr 24, 1971 [id number]        48 y.o.  [id number] presents with her daughter for her preoperative consult for her upcoming TAH/BSO.  History of heavy menses requiring 2 pads and a tampon still with bleedthrough episodes.  Significant cramping and discomfort with her menses.  Ultrasound shows multiple myomas.  Right and left ovaries not visualized.  Endometrial echo thin at 2 mm.  Hemoglobin 10.2.  Now on iron with most recent hemoglobin 11.4.  Recent Pap smear negative.  Ultrasound 2010 review showed no myomas.  Follow-up ultrasound 2015 showed several small myomas.  Options for management as discussed below reviewed and she elects for TAH/BSO.  Status post BTL in the past  Past medical history,surgical history, problem list, medications, allergies, family history and social history were all reviewed and documented in the EPIC chart.  Directed ROS with pertinent positives and negatives documented in the history of present illness/assessment and plan.  Exam: [name] assistant Vitals:   07/08/18 0928  BP: 130/82   General appearance:  Normal HEENT normal Lungs clear Cardiac regular rate no rubs murmurs gallops Abdomen soft nontender without masses guarding rebound Pelvic external BUS vagina with small right Bartholin gland cyst.  Cervix normal.  Uterus bulky at 16 weeks size filling the pelvis.  Adnexa unable to evaluate due to the bulk of the uterus.  Assessment/Plan:  48 y.o. [id number] with worsening menses and heavy bleeding requiring multiple protection and bleedthrough episodes as well as increasing pain with her menses.  Options for management were reviewed to include hormonal manipulation to include trial of progesterone suppression or Depo-Lupron, endometrial ablation, Mirena IUD, uterine artery embolization, myomectomy and hysterectomy.  Approaches with hysterectomy to include trial of laparoscopic, robotic or open procedure were reviewed.  Possibilities of converting  a laparoscopic/robotic to an open case intraoperatively also discussed.  Absolute irreversible sterility with hysterectomy reviewed.  Sexuality following hysterectomy was also discussed with the potential for persistent dyspareunia or orgasmic dysfunction.  The ovarian conservation issue was reviewed and the options to keep both ovaries or remove them discussed.  Benefits of ongoing hormone production versus risks of ovarian disease in the future and the potential need for HRT all discussed.  The issues with HRT to include thrombosis as well as breast cancer also discussed.  At this point the patient wants both ovaries removed and would accept HRT if needed.  The expected intraoperative and postoperative courses as well as the recovery period were reviewed. The risks of infection, prolonged antibiotics, reoperation for abscess or hematoma formation was discussed. The risks of hemorrhage necessitating transfusion and the risks of transfusion reaction, hepatitis, HIV, mad cow disease and other unknown entities was also discussed. Incisional complications to include opening and draining of incisions and closure by secondary intention, dehiscence and long-term issues of keloid/cosmetics and hernia formation were reviewed. The risk of inadvertent injury to internal organs including bowel, bladder, ureters, vessels, nerves either immediately recognized or delay recognized necessitating major exploratory reparative surgeries and future reparative surgeries including bowel resection, ostomy formation, bladder repair, ureteral damage repair was discussed with her. The patient's questions were answered to her satisfaction and she is ready to proceed with surgery.   Anastasio Auerbach MD, 10:12 AM 07/08/2018

## 2018-07-08 NOTE — Patient Instructions (Signed)
Followup for surgery as scheduled. 

## 2018-07-09 ENCOUNTER — Encounter: Payer: BLUE CROSS/BLUE SHIELD | Admitting: Physical Therapy

## 2018-07-14 ENCOUNTER — Encounter: Payer: BLUE CROSS/BLUE SHIELD | Admitting: Physical Therapy

## 2018-07-15 ENCOUNTER — Other Ambulatory Visit: Payer: Self-pay | Admitting: Surgery

## 2018-07-15 DIAGNOSIS — D241 Benign neoplasm of right breast: Secondary | ICD-10-CM

## 2018-07-16 ENCOUNTER — Encounter: Payer: BLUE CROSS/BLUE SHIELD | Admitting: Physical Therapy

## 2018-07-17 NOTE — Patient Instructions (Addendum)
Your procedure is scheduled on   Monday 07/21/2018   Report to [facility]. M.   Call this number if you have problems the morning of surgery  :[phone].   OUR ADDRESS IS [place].  WE ARE LOCATED IN [facility].                                     REMEMBER:  DO NOT EAT FOOD OR DRINK LIQUIDS AFTER MIDNIGHT .   NO SOLID FOOD AFTER MIDNIGHT THE NIGHT PRIOR TO SURGERY. NOTHING BY MOUTH EXCEPT CLEAR LIQUIDS UNTIL 3 HOURS PRIOR TO [place] SURGERY. PLEASE FINISH ENSURE DRINK PER SURGEON ORDER 3 HOURS PRIOR TO SCHEDULED SURGERY TIME WHICH NEEDS TO BE COMPLETED AT  0430 am.    CLEAR LIQUID DIET   Foods Allowed                                                                     Foods Excluded  Coffee and tea, regular and decaf                             liquids that you cannot  Plain Jell-O in any flavor                                             see through such as: Fruit ices (not with fruit pulp)                                     milk, soups, orange juice  Iced Popsicles                                    All solid food Carbonated beverages, regular and diet                                    Cranberry, grape and apple juices Sports drinks like Gatorade Lightly seasoned clear broth or consume(fat free) Sugar, honey syrup  Sample Menu Breakfast                                Lunch                                     Supper Cranberry juice                    Beef broth                            Chicken broth Jell-O  Grape juice                           Apple juice Coffee or tea                        Jell-O                                      Popsicle                                                Coffee or tea                        Coffee or tea  _____________________________________________________________________    TAKE THESE MEDICATIONS MORNING OF SURGERY WITH A SIP OF  WATER:  Amlodipine (Norvasc)   IF YOU ARE SPENDING THE NIGHT AFTER SURGERY PLEASE BRING ALL YOUR PRESCRIPTION MEDICATIONS IN THEIR ORIGINAL BOTTLES.                                    DO NOT WEAR JEWERLY, MAKE UP, OR NAIL POLISH,  DO NOT WEAR LOTIONS, POWDERS, PERFUMES OR DEODORANT. DO NOT SHAVE FOR 24 HOURS PRIOR TO DAY OF SURGERY.  CONTACTS, GLASSES, OR DENTURES MAY NOT BE WORN TO SURGERY.                                    [facility] IS NOT RESPONSIBLE  FOR ANY BELONGINGS.                                                                    Marland Kitchen                                                                                                    [facility] - Preparing for Surgery Before surgery, you can play an important role.  Because skin is not sterile, your skin needs to be as free of germs as possible.  You can reduce the number of germs on your skin by washing with CHG (chlorahexidine gluconate) soap before surgery.  CHG is an antiseptic cleaner which kills germs and bonds with the skin to continue killing germs even after washing. Please DO NOT use if you have an allergy to CHG or antibacterial soaps.  If your skin becomes reddened/irritated stop using the CHG and inform your nurse when you arrive  at Short Stay. Do not shave (including legs and underarms) for at least 48 hours prior to the first CHG shower.  You may shave your face/neck. Please follow these instructions carefully:  1.  Shower with CHG Soap the night before surgery and the  morning of Surgery.  2.  If you choose to wash your hair, wash your hair first as usual with your  normal  shampoo.  3.  After you shampoo, rinse your hair and body thoroughly to remove the  shampoo.                           4.  Use CHG as you would any other liquid soap.  You can apply chg directly  to the skin and wash                       Gently with a scrungie or clean washcloth.  5.  Apply the CHG Soap to your body ONLY FROM THE NECK DOWN.   Do  not use on face/ open                           Wound or open sores. Avoid contact with eyes, ears mouth and genitals (private parts).                       Wash face,  Genitals (private parts) with your normal soap.             6.  Wash thoroughly, paying special attention to the area where your surgery  will be performed.  7.  Thoroughly rinse your body with warm water from the neck down.  8.  DO NOT shower/wash with your normal soap after using and rinsing off  the CHG Soap.                9.  Pat yourself dry with a clean towel.            10.  Wear clean pajamas.            11.  Place clean sheets on your bed the night of your first shower and do not  sleep with pets. Day of Surgery : Do not apply any lotions/deodorants the morning of surgery.  Please wear clean clothes to the hospital/surgery center.  FAILURE TO FOLLOW THESE INSTRUCTIONS MAY RESULT IN THE CANCELLATION OF YOUR SURGERY PATIENT SIGNATURE_________________________________  NURSE SIGNATURE__________________________________  ________________________________________________________________________

## 2018-07-18 ENCOUNTER — Other Ambulatory Visit: Payer: Self-pay

## 2018-07-18 ENCOUNTER — Encounter (HOSPITAL_COMMUNITY): Payer: Self-pay

## 2018-07-18 ENCOUNTER — Encounter (HOSPITAL_COMMUNITY)
Admission: RE | Admit: 2018-07-18 | Discharge: 2018-07-18 | Disposition: A | Discharge status: Home / Self Care | Payer: BLUE CROSS/BLUE SHIELD | Source: Ambulatory Visit | Attending: Gynecology | Admitting: Gynecology

## 2018-07-18 DIAGNOSIS — D259 Leiomyoma of uterus, unspecified: Secondary | ICD-10-CM | POA: Diagnosis not present

## 2018-07-18 DIAGNOSIS — N920 Excessive and frequent menstruation with regular cycle: Secondary | ICD-10-CM | POA: Insufficient documentation

## 2018-07-18 DIAGNOSIS — Z01812 Encounter for preprocedural laboratory examination: Secondary | ICD-10-CM | POA: Insufficient documentation

## 2018-07-18 LAB — CBC
HCT: 36.4 % (ref 36.0–46.0)
HEMOGLOBIN: 10.7 g/dL — AB (ref 12.0–15.0)
MCH: 23 pg — ABNORMAL LOW (ref 26.0–34.0)
MCHC: 29.4 g/dL — ABNORMAL LOW (ref 30.0–36.0)
MCV: 78.3 fL — ABNORMAL LOW (ref 80.0–100.0)
Platelets: 665 10*3/uL — ABNORMAL HIGH (ref 150–400)
RBC: 4.65 MIL/uL (ref 3.87–5.11)
RDW: 17.9 % — ABNORMAL HIGH (ref 11.5–15.5)
WBC: 8.3 10*3/uL (ref 4.0–10.5)
nRBC: 0 % (ref 0.0–0.2)

## 2018-07-18 LAB — COMPREHENSIVE METABOLIC PANEL
ALT: 15 U/L (ref 0–44)
AST: 18 U/L (ref 15–41)
Albumin: 3.7 g/dL (ref 3.5–5.0)
Alkaline Phosphatase: 75 U/L (ref 38–126)
Anion gap: 8 (ref 5–15)
BILIRUBIN TOTAL: 0.2 mg/dL — AB (ref 0.3–1.2)
BUN: 9 mg/dL (ref 6–20)
CALCIUM: 9.2 mg/dL (ref 8.9–10.3)
CO2: 26 mmol/L (ref 22–32)
CREATININE: 0.76 mg/dL (ref 0.44–1.00)
Chloride: 102 mmol/L (ref 98–111)
GFR calc Af Amer: >60 mL/min (ref >60)
GFR calc non Af Amer: >60 mL/min (ref >60)
Glucose, Bld: 133 mg/dL — ABNORMAL HIGH (ref 70–99)
Potassium: 3.7 mmol/L (ref 3.5–5.1)
Sodium: 136 mmol/L (ref 135–145)
Total Protein: 7.9 g/dL (ref 6.5–8.1)

## 2018-07-18 LAB — HCG, SERUM, QUALITATIVE: Preg, Serum: NEGATIVE

## 2018-07-18 NOTE — Progress Notes (Cosign Needed)
Anesthesia Chart Review   Case:  [id number] Date/Time:  07/21/18 0730   Procedure:  HYSTERECTOMY ABDOMINAL WITH SALPINGO-OOPHORECTOMY (Bilateral ) - request 7:30am OR time in [place] block requests 2 hours   Anesthesia type:  General   Pre-op diagnosis:  leiomyoma, menorrhagia, anemia   Location:  [facility] OR ROOM .5 / [place]   Surgeon:  Anastasio Auerbach, MD      DISCUSSION: 48 yo former smoker (14 pack years, quit 09/20/10) with h/o scoliosis, anxiety, depression, HTN, leiomyoma, menorrhagia, anemia scheduled for above procedure 07/21/18 with Dr. Donalynn Furlong.   Pt reports difficulty waking up with previous anesthesia 03/30/19.   Pt seen in ED 07/01/18 for abdominal pain associated with uterine fibroids.  Increased sedation during ED visit, tested positive for THC on urine drug screen. Advised not to use THC concurrently with opioids and/or flexeril.  Pt can proceed with planned procedure barring acute status change.   VS: BP (!) 143/85   Pulse 81   Temp 37.4 C (Oral)   Resp 18   Ht 5\' 5"  (1.651 m)   Wt 83.9 kg   LMP 06/24/2018   SpO2 100%   BMI 30.79 kg/m   PROVIDERS: Bonnita Hollow, MD is PCP    LABS: Labs reviewed: Acceptable for surgery. (all labs ordered are listed, but only abnormal results are displayed)  Labs Reviewed  CBC - Abnormal; Notable for the following components:      Result Value   Hemoglobin 10.7 (*)    MCV 78.3 (*)    MCH 23.0 (*)    MCHC 29.4 (*)    RDW 17.9 (*)    Platelets 665 (*)    All other components within normal limits  COMPREHENSIVE METABOLIC PANEL - Abnormal; Notable for the following components:   Glucose, Bld 133 (*)    Total Bilirubin 0.2 (*)    All other components within normal limits  HCG, SERUM, QUALITATIVE     IMAGES: CT Angio Chest/Abd/Pelvis 05/29/18 IMPRESSION: No evidence of acute aortic dissection or acute vascular pathology. There is some atherosclerotic plaque of the distal abdominal  aorta but no aneurysm or flow limiting stenosis. 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  Spinal scoliotic curvature. Lower lumbar degenerative disc disease and facet disease.  Marked enlargement of the uterus probably secondary to large leiomyoma, measuring up to 13 x 12 x 9 cm.  Carotid Doppler 02/05/16 Summary: Bilateral: intimal wall thickening CCA. Mild soft plaque origin and throughout ICA and ECA. 1-395 ICA plaquing. Diastolic velocities are elevated in the distal ICAs, etiology unknown. Vertebral artery flow is antegrade.  EKG: 05/29/18  Rate 90bpm  Normal sinus rhythm  Borderline short PR interval   CV: Echo 08/09/17 Study Conclusions  - Left ventricle: The cavity size was normal. There was mild   concentric hypertrophy. Systolic function was normal. The   estimated ejection fraction was in the range of 60% to 65%. Wall   motion was normal; there were no regional wall motion   abnormalities. Left ventricular diastolic function parameters   were normal. - Aortic valve: Trileaflet; mildly thickened, mildly calcified   leaflets. - Right ventricle: The cavity size was at the upper limits of   normal. Wall thickness was normal. - Tricuspid valve: There was trivial regurgitation. - Pulmonic valve: There was trivial regurgitation. Past Medical History:  Diagnosis Date  . Anemia   . Anxiety   . Cellulitis   . Chest pain   .  Childhood asthma   . Chronic back pain    "all over" (05/29/2018)  . Complication of anesthesia    "difficult waking up " (03/30/2019)  . Depression   . Eczema   . Hypertension   . Scoliosis     Past Surgical History:  Procedure Laterality Date  . BREAST SURGERY     breast biopsy-right breast  . TUBAL LIGATION      MEDICATIONS: . acetaminophen (TYLENOL) 500 MG tablet  . amLODipine (NORVASC) 10 MG tablet  . chlorthalidone (HYGROTON) 25 MG tablet  . chlorthalidone (HYGROTON) 50 MG tablet  . docusate sodium (COLACE)  250 MG capsule  . DULoxetine (CYMBALTA) 60 MG capsule  . ferrous sulfate 325 (65 FE) MG tablet  . gabapentin (NEURONTIN) 600 MG tablet  . ibuprofen (ADVIL,MOTRIN) 800 MG tablet  . lidocaine (LIDODERM) 5 %  . metroNIDAZOLE (FLAGYL) 500 MG tablet  . nitrofurantoin, macrocrystal-monohydrate, (MACROBID) 100 MG capsule  . ondansetron (ZOFRAN ODT) 4 MG disintegrating tablet  . polyethylene glycol (MIRALAX) packet   No current facility-administered medications for this encounter.    Maia Plan [facility] Pre-Surgical Testing [phone] 07/18/18 2:50 PM

## 2018-07-18 NOTE — Progress Notes (Signed)
07/01/2018- noted in [facility] note and labs- rapid drug screen (Positive for opiates and marjuana), CBC,CMP,Lipase, Beta HCG, U/A  05/30/2018- noted in Epic-EKG  05/29/2018- noted in Epic- CT angio chest/ abd./pel/for dissection w and/or wo contrast  12/22/2017- noted in Epic-CXR  08/09/2017- noted in Epic-ECHO

## 2018-07-18 NOTE — Anesthesia Preprocedure Evaluation (Addendum)
Anesthesia Evaluation  Patient identified by MRN, date of birth, ID band Patient awake    Reviewed: Allergy & Precautions, NPO status , Patient's Chart, lab work & pertinent test results  History of Anesthesia Complications Negative for: history of anesthetic complications  Airway Mallampati: III  TM Distance: >3 FB Neck ROM: Full    Dental no notable dental hx. (+) Teeth Intact   Pulmonary neg pulmonary ROS, former smoker,    Pulmonary exam normal        Cardiovascular hypertension, Normal cardiovascular exam     Neuro/Psych PSYCHIATRIC DISORDERS Anxiety Depression negative neurological ROS     GI/Hepatic Neg liver ROS, GERD  ,  Endo/Other  negative endocrine ROS  Renal/GU negative Renal ROS  negative genitourinary   Musculoskeletal negative musculoskeletal ROS (+)   Abdominal   Peds  Hematology  (+) anemia ,   Anesthesia Other Findings 48 yo F for hysterectomy - anx/dep, HTN, anemia (Hgb 10.7)  Reproductive/Obstetrics negative OB ROS                          Anesthesia Physical Anesthesia Plan  ASA: II  Anesthesia Plan: General   Post-op Pain Management:    Induction: Intravenous  PONV Risk Score and Plan: 4 or greater and Ondansetron, Dexamethasone, Midazolam, Propofol infusion and Treatment may vary due to age or medical condition  Airway Management Planned: Oral ETT  Additional Equipment: None  Intra-op Plan:   Post-operative Plan: Extubation in OR  Informed Consent: I have reviewed the patients History and Physical, chart, labs and discussed the procedure including the risks, benefits and alternatives for the proposed anesthesia with the patient or authorized representative who has indicated his/her understanding and acceptance.     Dental advisory given  Plan Discussed with:   Anesthesia Plan Comments: (See PAT note 07/18/18, Konrad Felix, PA-C)       Anesthesia Quick Evaluation

## 2018-07-21 ENCOUNTER — Encounter (HOSPITAL_BASED_OUTPATIENT_CLINIC_OR_DEPARTMENT_OTHER)
Admission: RE | Disposition: A | Discharge status: Home / Self Care | Payer: Self-pay | Source: Home / Self Care | Attending: Gynecology

## 2018-07-21 ENCOUNTER — Ambulatory Visit (HOSPITAL_BASED_OUTPATIENT_CLINIC_OR_DEPARTMENT_OTHER)
Admission: RE | Admit: 2018-07-21 | Discharge: 2018-07-22 | Disposition: A | Discharge status: Home / Self Care | Payer: BLUE CROSS/BLUE SHIELD | Attending: Gynecology | Admitting: Gynecology

## 2018-07-21 ENCOUNTER — Other Ambulatory Visit: Payer: Self-pay

## 2018-07-21 ENCOUNTER — Encounter (HOSPITAL_BASED_OUTPATIENT_CLINIC_OR_DEPARTMENT_OTHER): Payer: Self-pay | Admitting: *Deleted

## 2018-07-21 ENCOUNTER — Ambulatory Visit (HOSPITAL_BASED_OUTPATIENT_CLINIC_OR_DEPARTMENT_OTHER): Payer: BLUE CROSS/BLUE SHIELD | Admitting: Anesthesiology

## 2018-07-21 ENCOUNTER — Ambulatory Visit (HOSPITAL_BASED_OUTPATIENT_CLINIC_OR_DEPARTMENT_OTHER): Payer: BLUE CROSS/BLUE SHIELD | Admitting: Physician Assistant

## 2018-07-21 ENCOUNTER — Encounter: Payer: BLUE CROSS/BLUE SHIELD | Admitting: Physical Therapy

## 2018-07-21 DIAGNOSIS — N8312 Corpus luteum cyst of left ovary: Secondary | ICD-10-CM | POA: Insufficient documentation

## 2018-07-21 DIAGNOSIS — I10 Essential (primary) hypertension: Secondary | ICD-10-CM | POA: Diagnosis not present

## 2018-07-21 DIAGNOSIS — N920 Excessive and frequent menstruation with regular cycle: Secondary | ICD-10-CM | POA: Diagnosis not present

## 2018-07-21 DIAGNOSIS — N838 Other noninflammatory disorders of ovary, fallopian tube and broad ligament: Secondary | ICD-10-CM | POA: Insufficient documentation

## 2018-07-21 DIAGNOSIS — D500 Iron deficiency anemia secondary to blood loss (chronic): Secondary | ICD-10-CM | POA: Diagnosis not present

## 2018-07-21 DIAGNOSIS — N83291 Other ovarian cyst, right side: Secondary | ICD-10-CM

## 2018-07-21 DIAGNOSIS — D241 Benign neoplasm of right breast: Secondary | ICD-10-CM

## 2018-07-21 DIAGNOSIS — K219 Gastro-esophageal reflux disease without esophagitis: Secondary | ICD-10-CM | POA: Diagnosis not present

## 2018-07-21 DIAGNOSIS — Z87891 Personal history of nicotine dependence: Secondary | ICD-10-CM | POA: Diagnosis not present

## 2018-07-21 DIAGNOSIS — Z9851 Tubal ligation status: Secondary | ICD-10-CM | POA: Diagnosis not present

## 2018-07-21 DIAGNOSIS — N888 Other specified noninflammatory disorders of cervix uteri: Secondary | ICD-10-CM | POA: Diagnosis not present

## 2018-07-21 DIAGNOSIS — N83292 Other ovarian cyst, left side: Secondary | ICD-10-CM

## 2018-07-21 DIAGNOSIS — D509 Iron deficiency anemia, unspecified: Secondary | ICD-10-CM | POA: Insufficient documentation

## 2018-07-21 DIAGNOSIS — N8311 Corpus luteum cyst of right ovary: Secondary | ICD-10-CM | POA: Insufficient documentation

## 2018-07-21 DIAGNOSIS — N879 Dysplasia of cervix uteri, unspecified: Secondary | ICD-10-CM | POA: Diagnosis not present

## 2018-07-21 DIAGNOSIS — D259 Leiomyoma of uterus, unspecified: Principal | ICD-10-CM | POA: Diagnosis present

## 2018-07-21 DIAGNOSIS — D219 Benign neoplasm of connective and other soft tissue, unspecified: Secondary | ICD-10-CM | POA: Diagnosis present

## 2018-07-21 HISTORY — PX: HYSTERECTOMY ABDOMINAL WITH SALPINGO-OOPHORECTOMY: SHX6792

## 2018-07-21 SURGERY — HYSTERECTOMY, ABDOMINAL, WITH SALPINGO-OOPHORECTOMY
Anesthesia: General | Site: Abdomen | Laterality: Bilateral

## 2018-07-21 MED ORDER — MIDAZOLAM HCL 2 MG/2ML IJ SOLN
INTRAMUSCULAR | Status: AC
  Filled 2018-07-21: qty 2

## 2018-07-21 MED ORDER — FENTANYL CITRATE (PF) 250 MCG/5ML IJ SOLN
INTRAMUSCULAR | Status: AC
  Filled 2018-07-21: qty 5

## 2018-07-21 MED ORDER — ARTIFICIAL TEARS OPHTHALMIC OINT
TOPICAL_OINTMENT | OPHTHALMIC | Status: AC
  Filled 2018-07-21: qty 3.5

## 2018-07-21 MED ORDER — DIPHENHYDRAMINE HCL 50 MG/ML IJ SOLN
12.5000 mg | Freq: Four times a day (QID) | INTRAMUSCULAR | Status: DC | PRN
  Filled 2018-07-21: qty 0.25

## 2018-07-21 MED ORDER — PHENYLEPHRINE 40 MCG/ML (10ML) SYRINGE FOR IV PUSH (FOR BLOOD PRESSURE SUPPORT)
PREFILLED_SYRINGE | INTRAVENOUS | Status: DC | PRN
  Administered 2018-07-21 (×2): 80 ug via INTRAVENOUS
  Administered 2018-07-21: 40 ug via INTRAVENOUS
  Administered 2018-07-21 (×2): 80 ug via INTRAVENOUS

## 2018-07-21 MED ORDER — ROCURONIUM BROMIDE 100 MG/10ML IV SOLN
INTRAVENOUS | Status: AC
  Filled 2018-07-21: qty 1

## 2018-07-21 MED ORDER — CHLORHEXIDINE GLUCONATE CLOTH 2 % EX PADS
6.0000 | MEDICATED_PAD | Freq: Once | CUTANEOUS | Status: DC
  Filled 2018-07-21: qty 6

## 2018-07-21 MED ORDER — 0.9 % SODIUM CHLORIDE (POUR BTL) OPTIME
TOPICAL | Status: DC | PRN
  Administered 2018-07-21 (×2): 1000 mL

## 2018-07-21 MED ORDER — ROCURONIUM BROMIDE 10 MG/ML (PF) SYRINGE
PREFILLED_SYRINGE | INTRAVENOUS | Status: DC | PRN
  Administered 2018-07-21: 50 mg via INTRAVENOUS

## 2018-07-21 MED ORDER — KETOROLAC TROMETHAMINE 30 MG/ML IJ SOLN
INTRAMUSCULAR | Status: AC
  Filled 2018-07-21: qty 1

## 2018-07-21 MED ORDER — SODIUM CHLORIDE 0.9% FLUSH
9.0000 mL | INTRAVENOUS | Status: DC | PRN
  Filled 2018-07-21: qty 10

## 2018-07-21 MED ORDER — DEXAMETHASONE SODIUM PHOSPHATE 10 MG/ML IJ SOLN
INTRAMUSCULAR | Status: AC
  Filled 2018-07-21: qty 1

## 2018-07-21 MED ORDER — DEXTROSE-NACL 5-0.9 % IV SOLN
INTRAVENOUS | Status: DC
  Administered 2018-07-21 (×2): via INTRAVENOUS
  Filled 2018-07-21 (×3): qty 1000

## 2018-07-21 MED ORDER — OXYCODONE HCL 5 MG PO TABS
5.0000 mg | ORAL_TABLET | Freq: Once | ORAL | Status: DC | PRN
  Filled 2018-07-21: qty 1

## 2018-07-21 MED ORDER — SODIUM CHLORIDE (PF) 0.9 % IJ SOLN
INTRAMUSCULAR | Status: AC
  Filled 2018-07-21: qty 50

## 2018-07-21 MED ORDER — KETOROLAC TROMETHAMINE 30 MG/ML IJ SOLN
INTRAMUSCULAR | Status: DC | PRN
  Administered 2018-07-21: 30 mg via INTRAVENOUS

## 2018-07-21 MED ORDER — GENTAMICIN SULFATE 40 MG/ML IJ SOLN
5.0000 mg/kg | INTRAVENOUS | Status: AC
  Administered 2018-07-21: 338.4 mg via INTRAVENOUS
  Filled 2018-07-21: qty 8.5

## 2018-07-21 MED ORDER — PROPOFOL 10 MG/ML IV BOLUS
INTRAVENOUS | Status: DC | PRN
  Administered 2018-07-21: 150 mg via INTRAVENOUS

## 2018-07-21 MED ORDER — ENOXAPARIN SODIUM 40 MG/0.4ML ~~LOC~~ SOLN
40.0000 mg | Freq: Once | SUBCUTANEOUS | Status: AC
  Administered 2018-07-21: 40 mg via SUBCUTANEOUS
  Filled 2018-07-21: qty 0.4

## 2018-07-21 MED ORDER — METOCLOPRAMIDE HCL 5 MG/ML IJ SOLN
INTRAMUSCULAR | Status: DC | PRN
  Administered 2018-07-21: 10 mg via INTRAVENOUS

## 2018-07-21 MED ORDER — MIDAZOLAM HCL 2 MG/2ML IJ SOLN
INTRAMUSCULAR | Status: DC | PRN
  Administered 2018-07-21: 2 mg via INTRAVENOUS

## 2018-07-21 MED ORDER — STERILE WATER FOR IRRIGATION IR SOLN
Status: DC | PRN
  Administered 2018-07-21: 500 mL

## 2018-07-21 MED ORDER — KETOROLAC TROMETHAMINE 30 MG/ML IJ SOLN
30.0000 mg | Freq: Four times a day (QID) | INTRAMUSCULAR | Status: DC
  Administered 2018-07-21 – 2018-07-22 (×2): 30 mg via INTRAVENOUS
  Filled 2018-07-21: qty 1

## 2018-07-21 MED ORDER — BUPIVACAINE LIPOSOME 1.3 % IJ SUSP
INTRAMUSCULAR | Status: DC | PRN
  Administered 2018-07-21: 20 mL

## 2018-07-21 MED ORDER — PHENYLEPHRINE 40 MCG/ML (10ML) SYRINGE FOR IV PUSH (FOR BLOOD PRESSURE SUPPORT)
PREFILLED_SYRINGE | INTRAVENOUS | Status: AC
  Filled 2018-07-21: qty 10

## 2018-07-21 MED ORDER — LIDOCAINE 2% (20 MG/ML) 5 ML SYRINGE
INTRAMUSCULAR | Status: DC | PRN
  Administered 2018-07-21: 50 mg via INTRAVENOUS

## 2018-07-21 MED ORDER — ACETAMINOPHEN 500 MG PO TABS
1000.0000 mg | ORAL_TABLET | ORAL | Status: AC
  Filled 2018-07-21: qty 2

## 2018-07-21 MED ORDER — DEXAMETHASONE SODIUM PHOSPHATE 10 MG/ML IJ SOLN
INTRAMUSCULAR | Status: DC | PRN
  Administered 2018-07-21: 8 mg via INTRAVENOUS

## 2018-07-21 MED ORDER — SUGAMMADEX SODIUM 200 MG/2ML IV SOLN
INTRAVENOUS | Status: DC | PRN
  Administered 2018-07-21: 200 mg via INTRAVENOUS

## 2018-07-21 MED ORDER — HYDROMORPHONE HCL 1 MG/ML IJ SOLN
0.2000 mg | INTRAMUSCULAR | Status: DC | PRN
  Filled 2018-07-21: qty 1

## 2018-07-21 MED ORDER — SODIUM CHLORIDE (PF) 0.9 % IJ SOLN
INTRAMUSCULAR | Status: DC | PRN
  Administered 2018-07-21: 30 mL

## 2018-07-21 MED ORDER — FENTANYL CITRATE (PF) 100 MCG/2ML IJ SOLN
INTRAMUSCULAR | Status: AC
  Filled 2018-07-21: qty 2

## 2018-07-21 MED ORDER — PROPOFOL 10 MG/ML IV BOLUS
INTRAVENOUS | Status: AC
  Filled 2018-07-21: qty 40

## 2018-07-21 MED ORDER — FENTANYL CITRATE (PF) 100 MCG/2ML IJ SOLN
25.0000 ug | INTRAMUSCULAR | Status: DC | PRN
  Administered 2018-07-21 (×2): 25 ug via INTRAVENOUS
  Administered 2018-07-21: 50 ug via INTRAVENOUS
  Filled 2018-07-21: qty 1

## 2018-07-21 MED ORDER — ONDANSETRON HCL 4 MG/2ML IJ SOLN
INTRAMUSCULAR | Status: AC
  Filled 2018-07-21: qty 2

## 2018-07-21 MED ORDER — OXYCODONE HCL 5 MG PO TABS
ORAL_TABLET | ORAL | Status: AC
  Filled 2018-07-21: qty 2

## 2018-07-21 MED ORDER — LACTATED RINGERS IV SOLN
INTRAVENOUS | Status: DC
  Administered 2018-07-21 (×2): via INTRAVENOUS
  Filled 2018-07-21: qty 1000

## 2018-07-21 MED ORDER — OXYCODONE HCL 5 MG PO TABS
5.0000 mg | ORAL_TABLET | ORAL | Status: DC | PRN
  Administered 2018-07-21: 10 mg via ORAL
  Filled 2018-07-21: qty 2

## 2018-07-21 MED ORDER — MORPHINE SULFATE 2 MG/ML IV SOLN
INTRAVENOUS | Status: DC
  Administered 2018-07-21: 11:00:00 via INTRAVENOUS
  Administered 2018-07-21: 10 mg via INTRAVENOUS
  Filled 2018-07-21 (×2): qty 30

## 2018-07-21 MED ORDER — GABAPENTIN 300 MG PO CAPS
300.0000 mg | ORAL_CAPSULE | ORAL | Status: AC
  Filled 2018-07-21: qty 1

## 2018-07-21 MED ORDER — ONDANSETRON HCL 4 MG/2ML IJ SOLN
4.0000 mg | Freq: Four times a day (QID) | INTRAMUSCULAR | Status: DC | PRN
  Filled 2018-07-21: qty 2

## 2018-07-21 MED ORDER — LIDOCAINE 2% (20 MG/ML) 5 ML SYRINGE
INTRAMUSCULAR | Status: AC
  Filled 2018-07-21: qty 5

## 2018-07-21 MED ORDER — ENOXAPARIN SODIUM 40 MG/0.4ML ~~LOC~~ SOLN
40.0000 mg | SUBCUTANEOUS | Status: DC
  Filled 2018-07-21: qty 0.4

## 2018-07-21 MED ORDER — CIPROFLOXACIN IN D5W 400 MG/200ML IV SOLN
400.0000 mg | INTRAVENOUS | Status: AC
  Filled 2018-07-21: qty 200

## 2018-07-21 MED ORDER — METOCLOPRAMIDE HCL 5 MG/ML IJ SOLN
INTRAMUSCULAR | Status: AC
  Filled 2018-07-21: qty 2

## 2018-07-21 MED ORDER — OXYCODONE-ACETAMINOPHEN 5-325 MG PO TABS
2.0000 | ORAL_TABLET | Freq: Four times a day (QID) | ORAL | Status: DC | PRN
  Administered 2018-07-21: 1 via ORAL
  Administered 2018-07-22 (×2): 2 via ORAL
  Filled 2018-07-21: qty 2

## 2018-07-21 MED ORDER — CLINDAMYCIN PHOSPHATE 900 MG/50ML IV SOLN
INTRAVENOUS | Status: AC
  Filled 2018-07-21: qty 50

## 2018-07-21 MED ORDER — ACETAMINOPHEN 10 MG/ML IV SOLN
INTRAVENOUS | Status: DC | PRN
  Administered 2018-07-21: 1000 mg via INTRAVENOUS

## 2018-07-21 MED ORDER — CLINDAMYCIN PHOSPHATE 900 MG/50ML IV SOLN
900.0000 mg | INTRAVENOUS | Status: AC
  Administered 2018-07-21: 900 mg via INTRAVENOUS
  Filled 2018-07-21: qty 50

## 2018-07-21 MED ORDER — GENTAMICIN SULFATE 40 MG/ML IJ SOLN
5.0000 mg/kg | INTRAVENOUS | Status: DC
  Filled 2018-07-21: qty 11

## 2018-07-21 MED ORDER — CLINDAMYCIN PHOSPHATE 900 MG/50ML IV SOLN
900.0000 mg | INTRAVENOUS | Status: DC
  Filled 2018-07-21: qty 50

## 2018-07-21 MED ORDER — ENOXAPARIN SODIUM 40 MG/0.4ML ~~LOC~~ SOLN
SUBCUTANEOUS | Status: AC
  Filled 2018-07-21: qty 0.4

## 2018-07-21 MED ORDER — OXYCODONE HCL 5 MG/5ML PO SOLN
5.0000 mg | Freq: Once | ORAL | Status: DC | PRN
  Filled 2018-07-21: qty 5

## 2018-07-21 MED ORDER — VASOPRESSIN 20 UNIT/ML IV SOLN
INTRAVENOUS | Status: AC
  Filled 2018-07-21: qty 1

## 2018-07-21 MED ORDER — BUPIVACAINE LIPOSOME 1.3 % IJ SUSP
INTRAMUSCULAR | Status: AC
  Filled 2018-07-21: qty 20

## 2018-07-21 MED ORDER — ESMOLOL HCL 100 MG/10ML IV SOLN
INTRAVENOUS | Status: DC | PRN
  Administered 2018-07-21: 20 mg via INTRAVENOUS

## 2018-07-21 MED ORDER — SUGAMMADEX SODIUM 200 MG/2ML IV SOLN
INTRAVENOUS | Status: AC
  Filled 2018-07-21: qty 2

## 2018-07-21 MED ORDER — ONDANSETRON HCL 4 MG/2ML IJ SOLN
4.0000 mg | Freq: Once | INTRAMUSCULAR | Status: DC | PRN
  Filled 2018-07-21: qty 2

## 2018-07-21 MED ORDER — FENTANYL CITRATE (PF) 100 MCG/2ML IJ SOLN
INTRAMUSCULAR | Status: DC | PRN
  Administered 2018-07-21 (×3): 50 ug via INTRAVENOUS
  Administered 2018-07-21: 100 ug via INTRAVENOUS

## 2018-07-21 MED ORDER — OXYCODONE-ACETAMINOPHEN 5-325 MG PO TABS
ORAL_TABLET | ORAL | Status: AC
  Filled 2018-07-21: qty 1

## 2018-07-21 MED ORDER — HEMOSTATIC AGENTS (NO CHARGE) OPTIME
TOPICAL | Status: DC | PRN
  Administered 2018-07-21: 1

## 2018-07-21 MED ORDER — ACETAMINOPHEN 10 MG/ML IV SOLN
INTRAVENOUS | Status: AC
  Filled 2018-07-21: qty 100

## 2018-07-21 MED ORDER — DIPHENHYDRAMINE HCL 12.5 MG/5ML PO ELIX
12.5000 mg | ORAL_SOLUTION | Freq: Four times a day (QID) | ORAL | Status: DC | PRN
  Filled 2018-07-21: qty 5

## 2018-07-21 MED ORDER — ONDANSETRON HCL 4 MG/2ML IJ SOLN
INTRAMUSCULAR | Status: DC | PRN
  Administered 2018-07-21: 4 mg via INTRAVENOUS

## 2018-07-21 MED ORDER — NALOXONE HCL 0.4 MG/ML IJ SOLN
0.4000 mg | INTRAMUSCULAR | Status: DC | PRN
  Filled 2018-07-21: qty 1

## 2018-07-21 SURGICAL SUPPLY — 38 items
APL PRP STRL LF DISP 70% ISPRP (MISCELLANEOUS) ×1
APL SKNCLS STERI-STRIP NONHPOA (GAUZE/BANDAGES/DRESSINGS) ×1
BAG URINE DRAINAGE (UROLOGICAL SUPPLIES) ×2 IMPLANT
BENZOIN TINCTURE PRP APPL 2/3 (GAUZE/BANDAGES/DRESSINGS) ×2 IMPLANT
CANISTER SUCT 3000ML PPV (MISCELLANEOUS) ×3 IMPLANT
CHLORAPREP W/TINT 26 (MISCELLANEOUS) ×2 IMPLANT
CLOSURE WOUND 1/2 X4 (GAUZE/BANDAGES/DRESSINGS) ×1
CONT PATH 16OZ SNAP LID 3702 (MISCELLANEOUS) ×3 IMPLANT
COVER WAND RF STERILE (DRAPES) ×3 IMPLANT
DECANTER SPIKE VIAL GLASS SM (MISCELLANEOUS) ×2 IMPLANT
DRAPE LAPAROSCOPIC ABDOMINAL (DRAPES) ×3 IMPLANT
DRAPE WARM FLUID 44X44 (DRAPE) ×2 IMPLANT
DRSG OPSITE POSTOP 4X10 (GAUZE/BANDAGES/DRESSINGS) ×3 IMPLANT
DURAPREP 26ML APPLICATOR (WOUND CARE) ×1 IMPLANT
GLOVE BIO SURGEON STRL SZ7 (GLOVE) ×1 IMPLANT
GLOVE BIO SURGEON STRL SZ7.5 (GLOVE) ×1 IMPLANT
GLOVE BIOGEL PI IND STRL 7.0 (GLOVE) ×3 IMPLANT
GLOVE BIOGEL PI INDICATOR 7.0 (GLOVE) ×6
GOWN STRL REUS W/TWL LRG LVL3 (GOWN DISPOSABLE) ×9 IMPLANT
HEMOSTAT ARISTA ABSORB 3G PWDR (HEMOSTASIS) ×2 IMPLANT
NEEDLE HYPO 22GX1.5 SAFETY (NEEDLE) ×3 IMPLANT
NS IRRIG 1000ML POUR BTL (IV SOLUTION) ×3 IMPLANT
PACK ABDOMINAL GYN (CUSTOM PROCEDURE TRAY) ×3 IMPLANT
PAD OB MATERNITY 4.3X12.25 (PERSONAL CARE ITEMS) ×3 IMPLANT
PROTECTOR NERVE ULNAR (MISCELLANEOUS) ×3 IMPLANT
RETAINER VISCERAL (MISCELLANEOUS) IMPLANT
SPONGE LAP 18X18 RF (DISPOSABLE) ×4 IMPLANT
STRIP CLOSURE SKIN 1/2X4 (GAUZE/BANDAGES/DRESSINGS) ×1 IMPLANT
SUT VIC AB 0 CT1 18XCR BRD8 (SUTURE) ×3 IMPLANT
SUT VIC AB 0 CT1 27 (SUTURE) ×9
SUT VIC AB 0 CT1 27XBRD ANBCTR (SUTURE) ×3 IMPLANT
SUT VIC AB 0 CT1 8-18 (SUTURE) ×9
SUT VIC AB 4-0 KS 27 (SUTURE) ×3 IMPLANT
SUT VICRYL 0 TIES 12 18 (SUTURE) ×3 IMPLANT
SYR BULB IRRIGATION 50ML (SYRINGE) ×3 IMPLANT
SYR CONTROL 10ML LL (SYRINGE) ×3 IMPLANT
TOWEL OR 17X26 10 PK STRL BLUE (TOWEL DISPOSABLE) ×6 IMPLANT
TRAY FOLEY W/BAG SLVR 14FR (SET/KITS/TRAYS/PACK) ×1 IMPLANT

## 2018-07-21 NOTE — Op Note (Signed)
Susan Sanders 1971-01-10 [id number]   Post Operative Note   Date of surgery:  07/21/2018  Pre Op Dx: Leiomyoma, menorrhagia, iron deficiency anemia  Post Op Dx: Leiomyoma, menorrhagia, iron deficiency anemia  Procedure: Total abdominal hysterectomy bilateral salpingo-oophorectomy  Surgeon:  Anastasio Auerbach  Assistant:  Princess Bruins  Anesthesia:  General  EBL: 388 cc  Complications:  None  Specimen: Uterus, separate cervix, right and left ovaries with attached fallopian tube segments.  Clinical weight 515 g to pathology  Findings: Operative: Anterior cul-de-sac normal.  Posterior cul-de-sac normal.  Uterus enlarged with multiple palpable leiomyoma.  Right and left ovaries grossly normal.  Right and left fallopian tube segments grossly normal.  No evidence of pelvic adhesions or endometriosis.  Upper abdominal digital exam was normal.  Procedure: The patient was taken to the operating room, underwent general anesthesia, received an abdominal/vaginal preparation per nursing personnel and an indwelling Foley catheter was placed in sterile technique.  The timeout was performed by the surgical team.  The patient was draped in the usual fashion.  The abdomen was sharply entered through a Pfannenstiel incision achieving adequate hemostasis at all levels.  The uterus was elevated through the incision and the right and left round ligaments were identified and transected with electrocautery to aid in mobilization.  A Balfour retractor and bladder blade were then placed within the incision and the intestines were packed from the operative field.  The anterior vesicouterine peritoneal fold was sharply and bluntly developed without difficulty and the right infundibulopelvic ligament and vessels were identified, the ureter identified away from the surgical site and the pedicle was clamped, cut and doubly ligated using 0 Vicryl suture.  A similar procedure was carried out on the other side.  The  uterus was then progressively freed from its attachments through clamping cutting and ligating of the parametrial and paracervical tissues using 0 Vicryl suture, advancing the bladder flap through sharp and blunt dissection.  A supracervical hysterectomy was initially performed to aid in visualization and subsequently the cervix was freed from its attachments and the upper vagina crossclamped and the cervix excised.  Right and left vaginal angle sutures were placed using 0 Vicryl suture and the intervening vagina was closed anterior to posterior using 0 Vicryl suture in figure-of-eight interrupted stitch.  The pelvis was copiously irrigated with several small bleeding points addressed with electrocautery.  Prophylactic Arista was placed within the pelvis and bladder flap region.  The Balfour retractor and bladder blade were removed along with the bowel packing and the anterior fascia was reapproximated using 0 Vicryl suture starting in the angles meeting in the middle.  The subcutaneous tissues were irrigated with hemostasis achieved with electrocautery.  Exparel was injected into the subcutaneous/fascial planes and the skin closed using 4-0 Vicryl in a running subcuticular stitch.  Steri-Strips with benzoin were applied as was a sterile dressing.  The sponge, needle and instrument count were verified correct and the patient was wanded according to protocol.  The specimen was identified for pathology.  The patient received intraoperative Toradol, was awakened without difficulty and taken to recovery room in good condition having tolerated procedure well with free-flowing clear yellow urine noted in Foley catheter.   Anastasio Auerbach MD, 9:24 AM 07/21/2018

## 2018-07-21 NOTE — Anesthesia Postprocedure Evaluation (Signed)
Anesthesia Post Note  Patient: Saleema Weppler  Procedure(s) Performed: TOTAL HYSTERECTOMY ABDOMINAL, BILATERAL SALPINGO-OOPHORECTOMY (Bilateral Abdomen)     Patient location during evaluation: PACU Anesthesia Type: General Level of consciousness: awake and alert Pain management: pain level controlled Vital Signs Assessment: post-procedure vital signs reviewed and stable Respiratory status: spontaneous breathing, nonlabored ventilation and respiratory function stable Cardiovascular status: blood pressure returned to baseline and stable Postop Assessment: no apparent nausea or vomiting Anesthetic complications: no    Last Vitals:  Vitals:   07/21/18 1000 07/21/18 1024  BP: (!) 153/84 134/78  Pulse: (!) 105 95  Resp: 13 14  Temp:  36.8 C  SpO2: 99% 100%                Lidia Collum

## 2018-07-21 NOTE — H&P (Signed)
The patient was examined.  I reviewed the proposed surgery and consent form with the patient.  The dictated history and physical is current and accurate and all questions were answered. The patient is ready to proceed with surgery and has a realistic understanding and expectation for the outcome.   Anastasio Auerbach MD, 7:10 AM 07/21/2018

## 2018-07-21 NOTE — Anesthesia Procedure Notes (Signed)
Procedure Name: Intubation Date/Time: 07/21/2018 7:43 AM Performed by: Lidia Collum, MD Pre-anesthesia Checklist: Patient identified, Emergency Drugs available, Suction available and Patient being monitored Patient Re-evaluated:Patient Re-evaluated prior to induction Oxygen Delivery Method: Circle system utilized Preoxygenation: Pre-oxygenation with 100% oxygen Induction Type: IV induction Ventilation: Mask ventilation without difficulty Grade View: Grade I Tube type: Oral Tube size: 7.0 mm Number of attempts: 1 Airway Equipment and Method: Stylet and Oral airway Placement Confirmation: ETT inserted through vocal cords under direct vision,  positive ETCO2 and breath sounds checked- equal and bilateral Secured at: 22 cm Tube secured with: Tape Dental Injury: Teeth and Oropharynx as per pre-operative assessment

## 2018-07-21 NOTE — Progress Notes (Signed)
In to see patient.  Complaining of a lot of back pain and abdominal discomfort.  Attempted sitting up but became orthostatic.  BP's otherwise running in the high range.  Required higher levels of pain medication since surgery.  Urine output good.  Taking and holding down fluids  Patient awake alert in mild distress. Lungs clear Cardiac regular rate no rubs murmurs or gallops Abdomen soft with mild to moderate tenderness with deep palpation.  Dressing dry without evidence of underlying bleed  Day of surgery status post TAH/BSO.  Uncomplicated intraoperative course with estimated blood loss approximately 100 cc.  Patient having a lot of discomfort since surgery requiring higher levels of narcotics.  Have DC'd the PCA and started oral narcotics with continued Toradol every 6 hours.  Feel episode of hypotension possibly related to her narcotics and sedation.  Will attempt to walk this evening and remove her Foley.  Will reassess in a.m. for discharge.  Possible keeping another day discussed for pain management routine postop recovery

## 2018-07-21 NOTE — Progress Notes (Signed)
Pt complaining of severe pain, sleeping at present, when pt awake grimacing and moaning. States she doesn't want to eat and wants to sleep today. Discussed this with Dr. Phineas Real, orders given for po oxycodone, toradol and iv dilaudid, ambulation and removal of foley as soon as possible. 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

## 2018-07-21 NOTE — OR Nursing (Signed)
SPECIMEN WEIGHT REQUESTED PER MD = 515G

## 2018-07-21 NOTE — Transfer of Care (Signed)
  Last Vitals:  Vitals Value Taken Time  BP    Temp    Pulse    Resp    SpO2      Last Pain:  Vitals:   07/21/18 0606  TempSrc:   PainSc: 4       Patients Stated Pain Goal: 7 (07/21/18 0606) Immediate Anesthesia Transfer of Care Note  Patient: Susan Sanders  Procedure(s) Performed: Procedure(s) (LRB): TOTAL HYSTERECTOMY ABDOMINAL, BILATERAL SALPINGO-OOPHORECTOMY (Bilateral)  Patient Location: PACU  Anesthesia Type: General  Level of Consciousness: awake, alert  and oriented  Airway & Oxygen Therapy: Patient Spontanous Breathing and Patient connected to nasal cannula oxygen  Post-op Assessment: Report given to PACU RN and Post -op Vital signs reviewed and stable  Post vital signs: Reviewed and stable  Complications: No apparent anesthesia complications

## 2018-07-21 NOTE — Progress Notes (Signed)
Attempted to get pt out of bed to ambulate, very slow to sit on side of bed. Immediately felt hot and dizzy, BP sitting 78/62, hr 99, states she is too weak to get up and falling back to sleep.  Pt back to supine position, BP 123/91 HR 97, will rest for now and attempt ambulation again after a few hours of rest. Encouraged po's .

## 2018-07-22 ENCOUNTER — Other Ambulatory Visit: Payer: Self-pay | Admitting: Family Medicine

## 2018-07-22 ENCOUNTER — Telehealth: Payer: Self-pay | Admitting: *Deleted

## 2018-07-22 DIAGNOSIS — N8311 Corpus luteum cyst of right ovary: Secondary | ICD-10-CM | POA: Diagnosis not present

## 2018-07-22 DIAGNOSIS — N838 Other noninflammatory disorders of ovary, fallopian tube and broad ligament: Secondary | ICD-10-CM | POA: Diagnosis not present

## 2018-07-22 DIAGNOSIS — Z9851 Tubal ligation status: Secondary | ICD-10-CM | POA: Diagnosis not present

## 2018-07-22 DIAGNOSIS — N8312 Corpus luteum cyst of left ovary: Secondary | ICD-10-CM | POA: Diagnosis not present

## 2018-07-22 DIAGNOSIS — Z87891 Personal history of nicotine dependence: Secondary | ICD-10-CM | POA: Diagnosis not present

## 2018-07-22 DIAGNOSIS — N888 Other specified noninflammatory disorders of cervix uteri: Secondary | ICD-10-CM | POA: Diagnosis not present

## 2018-07-22 DIAGNOSIS — I10 Essential (primary) hypertension: Secondary | ICD-10-CM

## 2018-07-22 DIAGNOSIS — D259 Leiomyoma of uterus, unspecified: Secondary | ICD-10-CM | POA: Diagnosis not present

## 2018-07-22 DIAGNOSIS — N920 Excessive and frequent menstruation with regular cycle: Secondary | ICD-10-CM | POA: Diagnosis not present

## 2018-07-22 DIAGNOSIS — D509 Iron deficiency anemia, unspecified: Secondary | ICD-10-CM | POA: Diagnosis not present

## 2018-07-22 LAB — CBC
HCT: 21.6 % — ABNORMAL LOW (ref 36.0–46.0)
HEMOGLOBIN: 6.6 g/dL — AB (ref 12.0–15.0)
MCH: 23.5 pg — ABNORMAL LOW (ref 26.0–34.0)
MCHC: 30.6 g/dL (ref 30.0–36.0)
MCV: 76.9 fL — AB (ref 80.0–100.0)
Platelets: 404 10*3/uL — ABNORMAL HIGH (ref 150–400)
RBC: 2.81 MIL/uL — ABNORMAL LOW (ref 3.87–5.11)
RDW: 17.8 % — ABNORMAL HIGH (ref 11.5–15.5)
WBC: 9.9 10*3/uL (ref 4.0–10.5)
nRBC: 0 % (ref 0.0–0.2)

## 2018-07-22 MED ORDER — OXYCODONE-ACETAMINOPHEN 5-325 MG PO TABS: 1.0000 | ORAL_TABLET | Freq: Four times a day (QID) | ORAL | 0 refills | Status: DC | PRN

## 2018-07-22 MED ORDER — SODIUM CHLORIDE 0.9 % IV SOLN
510.0000 mg | Freq: Once | INTRAVENOUS | Status: AC
  Administered 2018-07-22: 510 mg via INTRAVENOUS
  Filled 2018-07-22 (×2): qty 17

## 2018-07-22 MED ORDER — OXYCODONE-ACETAMINOPHEN 5-325 MG PO TABS
ORAL_TABLET | ORAL | Status: AC
  Filled 2018-07-22: qty 2

## 2018-07-22 MED ORDER — OXYCODONE-ACETAMINOPHEN 5-325 MG PO TABS: 2.0000 | ORAL_TABLET | Freq: Four times a day (QID) | ORAL | 0 refills | Status: DC | PRN

## 2018-07-22 MED ORDER — KETOROLAC TROMETHAMINE 30 MG/ML IJ SOLN
INTRAMUSCULAR | Status: AC
  Filled 2018-07-22: qty 1

## 2018-07-22 NOTE — Discharge Summary (Signed)
Susan Sanders [birth year] [id number]   Discharge Summary  Date of Admission:  07/21/2018  Date of Discharge:  07/22/2018  Discharge Diagnosis: Leiomyoma, menorrhagia, iron deficiency anemia  Procedure:  Procedure(s): TOTAL HYSTERECTOMY ABDOMINAL, BILATERAL SALPINGO-OOPHORECTOMY  Pathology:  Uterus, cervix and bilateral fallopian tubes, and ovaries CERVIX: - SQUAMOUS METAPLASIA AND NABOTHIAN CYSTS. - NO DYSPLASIA OR MALIGNANCY. ENDOMETRIUM: - SECRETORY WITH FOCI OF FIBROSIS AND DEGENERATIVE CHANGES. - NO HYPERPLASIA OR MALIGNANCY. MYOMETRIUM: - LEIOMYOMATA WITH DEGENERATIVE CHANGES. - NO EVIDENCE OF MALIGNANCY. RIGHT OVARY: - CORPUS LUTEUM. - NO ENDOMETRIOSIS OR MALIGNANCY. LEFT OVARY: - HEMORRHAGIC CORPUS LUTEUM. - NO ENDOMETRIOSIS OR MALIGNANCY. RIGHT AND LEFT FALLOPIAN TUBES: - FINDINGS CONSISTENT WITH PREVIOUS TUBAL LIGATION. - BENIGN PARATUBAL CYSTS. - NO ENDOMETRIOSIS OR MALIGNANCY.  Hospital Course: The patient underwent an uncomplicated TAH/BSO.  Her first day postoperative hemoglobin was found to be 6.6 with preoperative hemoglobin 10.  The patient was clinically stable ambulating, voiding with good urine output, eating and drinking with good pain relief on oral medication.  Options for management to include transfusion was discussed.  The patient received IV iron infusion with Feraheme before discharge and was discharged home.  The patient received instructions for postoperative care and call precautions particularly pertaining to a low hemoglobin.  She received prescriptions per AVS and will be seen in the office 2 weeks following discharge.     Anastasio Auerbach MD, 2:10 PM 07/22/2018

## 2018-07-22 NOTE — Telephone Encounter (Signed)
Done

## 2018-07-22 NOTE — Telephone Encounter (Signed)
Patient called stating she could not pick up her pain medication Percocet 5-325 mg,( post surgery yesterday) states the pharmacy told her the is dose too high for 2 tablets every 6 hours. I called [facility] and was informed the directions will need to be changed and re-sent. She can have 1 tablet by mouth every 6 hours or 2 tablets by mouth every 8 hours. I will pend medication so you can fix the directions and re-send to the pharmacy.

## 2018-07-22 NOTE — Progress Notes (Signed)
Susan Sanders 29-Dec-1970 [id number]   1 Day Post-Op Procedure(s) (LRB): TOTAL HYSTERECTOMY ABDOMINAL, BILATERAL SALPINGO-OOPHORECTOMY (Bilateral)  Subjective: Patient reports feels well, pain severity reported mild, Yes.   taking PO, foley catheter out, Yes.   voiding, Yes.   ambulating, No. passing flatus  Objective: Vital signs in last 24 hours: Temp:  [97.3 F (36.3 C)-98.3 F (36.8 C)] 98.3 F (36.8 C) (03/10 0345) Pulse Rate:  [95-112] 110 (03/10 0345) Resp:  [13-21] 20 (03/10 0345) BP: (78-178)/(62-92) 137/70 (03/10 0345) SpO2:  [98 %-100 %] 100 % (03/10 0345)      EXAM General: awake, alert and no distress Resp: clear to auscultation bilaterally Cardio: regular rate and rhythm GI: soft, minimal tenderness, bowel sounds active, dressing dry Lower Extremities: Without swelling or tenderness Vaginal Bleeding: Reported scant   Lab Results:  Recent Labs    07/22/18 0523  WBC 9.9  HGB 6.6*  HCT 21.6*  PLT 404*    Assessment: s/p Procedure(s): TOTAL HYSTERECTOMY ABDOMINAL, BILATERAL SALPINGO-OOPHORECTOMY: progressing well, hemoglobin noted at 6.6.  Patient clinically stable ambulating well, taking p.o., voiding good amounts of urine with good pain relief on oral medication.  Discussed options with the patient to include transfusion.  Given she is clinically stable and do not anticipate ongoing blood loss will hold on transfusion.  We will proceed with Feraheme infusion to promote a more rapid rise in hemoglobin.  We will plan on discharge after the infusion.  Platelet count noted to be normal.  Had plan on Lovenox due to her thrombocytosis preoperatively but will hold on this.  Plan: Discharge home today.  Precautions, instructions and follow up were discussed with the patient.  ASAP call precautions if any bleeding.  Prescriptions provided per AVS.  Patient to call the office to arrange a post-operative appointmant in 2 weeks.    Anastasio Auerbach MD, 7:28 AM  07/22/2018

## 2018-07-22 NOTE — Progress Notes (Signed)
Dr. Phineas Real called and notified of critical lab of Hgb of 6.6, prior to surgery 10.7. no new orders will come see pt.

## 2018-07-22 NOTE — Discharge Instructions (Signed)
°  Postoperative Instructions Hysterectomy ° °Dr. Maribel Hadley and the nursing staff have discussed postoperative instructions with you.  If you have any questions please ask them before you leave the hospital, or call Dr Sanda Dejoy’s office at [phone].   ° °We would like to emphasize the following instructions: ° ° °  Call the office to make your follow-up appointment as recommended by Dr Alayza Pieper (usually 2 weeks). ° °  You were given a prescription, or one was ordered for you at the pharmacy you designated.  Get that prescription filled and take the medication according to instructions. ° °  You may eat a regular diet, but slowly until you start having bowel movements. ° °  Drink plenty of water daily. ° °  Nothing in the vagina (intercourse, douching, objects of any kind) until released by Dr Tejuan Gholson. ° °  No driving for two weeks.  Wait to be cleared by Dr Gamaliel Charney at your first post op check.  Car rides (short) are ok after several days at home, as long as you are not having significant pain, but no traveling out of town. ° °  You may shower, but no baths.  Walking up and down stairs is ok.  No heavy lifting, prolonged standing, repeated bending or any “working out” until your first post op check. ° °  Rest frequently, listen to your body and do not push yourself and overdo it. ° °  Call if: ° °o Your pain medication does not seem strong enough. °o Worsening pain or abdominal bloating °o Persistent nausea or vomiting °o Difficulty with urination or bowel movements. °o Temperature of 101 degrees or higher. °o Bleeding heavier then staining (clots or period type flow). °o Incisions become red, tender or begin to drain. °o You have any questions or concerns. °

## 2018-07-23 ENCOUNTER — Encounter: Payer: BLUE CROSS/BLUE SHIELD | Admitting: Physical Therapy

## 2018-07-23 ENCOUNTER — Encounter (HOSPITAL_BASED_OUTPATIENT_CLINIC_OR_DEPARTMENT_OTHER): Payer: Self-pay | Admitting: Gynecology

## 2018-07-28 ENCOUNTER — Encounter: Payer: BLUE CROSS/BLUE SHIELD | Admitting: Physical Therapy

## 2018-07-30 ENCOUNTER — Encounter: Payer: BLUE CROSS/BLUE SHIELD | Admitting: Physical Therapy

## 2018-07-30 NOTE — Progress Notes (Signed)
Called and spoke with the triage RN April at [facility]. Asked her to let Dr. Ninfa Linden know of pt's recent TAH and resulting Hemoglobin of 6.6 on 3/11.  Requested that he put orders in today so that pt can have labs drawn and resulted prior to seed placement 08/04/18.

## 2018-07-31 ENCOUNTER — Encounter (HOSPITAL_BASED_OUTPATIENT_CLINIC_OR_DEPARTMENT_OTHER): Payer: Self-pay | Admitting: *Deleted

## 2018-07-31 ENCOUNTER — Other Ambulatory Visit: Payer: Self-pay

## 2018-07-31 ENCOUNTER — Telehealth: Payer: Self-pay | Admitting: *Deleted

## 2018-07-31 NOTE — Telephone Encounter (Signed)
Pt called stating she noticed bleeding when she wiped after bowel movement.Not enough to soak a pad,just spotting.She wanted [place] to be aware. She states she's aware she has a appt tomorrow 08/01/2018 and can wait to be seen then.

## 2018-08-01 ENCOUNTER — Ambulatory Visit (INDEPENDENT_AMBULATORY_CARE_PROVIDER_SITE_OTHER): Payer: BLUE CROSS/BLUE SHIELD | Admitting: Gynecology

## 2018-08-01 ENCOUNTER — Encounter: Payer: Self-pay | Admitting: Gynecology

## 2018-08-01 ENCOUNTER — Encounter: Payer: Self-pay | Admitting: *Deleted

## 2018-08-01 VITALS — BP 124/80

## 2018-08-01 DIAGNOSIS — D508 Other iron deficiency anemias: Secondary | ICD-10-CM | POA: Diagnosis not present

## 2018-08-01 DIAGNOSIS — Z4889 Encounter for other specified surgical aftercare: Secondary | ICD-10-CM

## 2018-08-01 LAB — CBC WITH DIFFERENTIAL/PLATELET
Absolute Monocytes: 626 cells/uL (ref 200–950)
Basophils Absolute: 37 cells/uL (ref 0–200)
Basophils Relative: 0.4 %
Eosinophils Absolute: 110 cells/uL (ref 15–500)
Eosinophils Relative: 1.2 %
HCT: 28.8 % — ABNORMAL LOW (ref 35.0–45.0)
Hemoglobin: 9.1 g/dL — ABNORMAL LOW (ref 11.7–15.5)
LYMPHS ABS: 1886 {cells}/uL (ref 850–3900)
MCH: 24.7 pg — ABNORMAL LOW (ref 27.0–33.0)
MCHC: 31.6 g/dL — ABNORMAL LOW (ref 32.0–36.0)
MCV: 78.3 fL — ABNORMAL LOW (ref 80.0–100.0)
MPV: 8.6 fL (ref 7.5–12.5)
Monocytes Relative: 6.8 %
Neutro Abs: 6541 cells/uL (ref 1500–7800)
Neutrophils Relative %: 71.1 %
Platelets: 617 10*3/uL — ABNORMAL HIGH (ref 140–400)
RBC: 3.68 10*6/uL — ABNORMAL LOW (ref 3.80–5.10)
RDW: 20 % — ABNORMAL HIGH (ref 11.0–15.0)
Total Lymphocyte: 20.5 %
WBC: 9.2 10*3/uL (ref 3.8–10.8)

## 2018-08-01 NOTE — Patient Instructions (Signed)
Follow-up in 2 weeks for your next postoperative visit.

## 2018-08-01 NOTE — Progress Notes (Signed)
    Susan Sanders 03-02-1971 [id number]        48 y.o.  [id number] presents for her postoperative visit.  Status post TAH/BSO.  Preoperative hemoglobin was 10 postoperative hemoglobin was 6.6.  She was given Feraheme infusion before discharge.  Has been taking iron daily.  Has been having a lot of problems with constipation and straining.  Started bleeding yesterday like light menses.  No pain.  Past medical history,surgical history, problem list, medications, allergies, family history and social history were all reviewed and documented in the EPIC chart.  Directed ROS with pertinent positives and negatives documented in the history of present illness/assessment and plan.  Exam: [name] assistant Vitals:   08/01/18 0931  BP: 124/80   General appearance:  Normal Abdomen soft nontender without masses guarding rebound.  Incision healed nicely. Pelvic external BUS vagina with blood staining noted.  Slight oozing at the vaginal cuff.  Monsel solution applied.  Bimanual without masses or tenderness  Assessment/Plan:  48 y.o. [id number] with slight oozing from the vaginal cuff after episode of heavy straining with constipation.  I think she pulled 1 of her sutures with a little bit of bleeding.  Monsel's was applied to the area which stopped the bleeding.  Exam otherwise is unremarkable with no tenderness or masses/hematomas palpated.  Was significantly anemic postop although asymptomatic.  Received Feraheme.  Check CBC now.  Recommend that she stop oral iron she is taking until her bowel movements become better and she is going to use more stool softeners.  Follow-up in 2 weeks for her next postoperative visit.    Anastasio Auerbach MD, 9:47 AM 08/01/2018

## 2018-08-01 NOTE — Progress Notes (Signed)
Upon check out for her first post op appt, the patient requested a letter be sent to her job stating her surgery date and RTW date. I asked Dr Phineas Real how long the patient would be out and he said 6-8.  I typed the letter for pt to be out 8 weeks and if the patient wishes to return before then, then we can type a new letter after her next post op apt 08/14/18.  I left message on pt's VM and faxed letter to Carolyne Littles [phone] per pt request KW CMA.

## 2018-08-06 ENCOUNTER — Other Ambulatory Visit: Payer: Self-pay | Admitting: Family Medicine

## 2018-08-06 ENCOUNTER — Telehealth: Payer: Self-pay | Admitting: *Deleted

## 2018-08-06 DIAGNOSIS — I10 Essential (primary) hypertension: Secondary | ICD-10-CM

## 2018-08-06 NOTE — Telephone Encounter (Signed)
Just FYI Patient called c/o "abdomen pain" took last ibuprofen at 5am, post TAH/BSO on 07/21/18, had post op on 08/01/18. Patient said her abdomen was still "sore" I explained not abnormal to still have discomfort. I discuss pain verse discomfort, Incision site looks well, no drainage, still tender to touch, no blood noted in this area. she has no fever, body chills, urinary symptoms, vaginal symptoms, nausea, vomiting, having regular bowel movements now, still bleeding, flow has not increased since OV on 08/01/18. She eating, drinking fluids, able to move around house well. I told her Dr. Phineas Real sent Rx for Ibuprofen 800 mg tablet in Feb and put refills to take PRN. Patient was not aware refills on Rx. Patient said she feel better since surgery, will follow up if needed.

## 2018-08-12 ENCOUNTER — Other Ambulatory Visit: Payer: Self-pay

## 2018-08-12 ENCOUNTER — Encounter: Payer: Self-pay | Admitting: Gynecology

## 2018-08-12 ENCOUNTER — Ambulatory Visit (INDEPENDENT_AMBULATORY_CARE_PROVIDER_SITE_OTHER): Payer: Medicaid Other | Admitting: Gynecology

## 2018-08-12 VITALS — BP 118/78 | Temp 98.7°F

## 2018-08-12 DIAGNOSIS — Z9889 Other specified postprocedural states: Secondary | ICD-10-CM

## 2018-08-12 MED ORDER — METRONIDAZOLE 500 MG PO TABS: 500.0000 mg | ORAL_TABLET | Freq: Two times a day (BID) | ORAL | 0 refills | Status: DC

## 2018-08-12 MED ORDER — CIPROFLOXACIN HCL 250 MG PO TABS: 250.0000 mg | ORAL_TABLET | Freq: Two times a day (BID) | ORAL | 0 refills | Status: DC

## 2018-08-12 MED ORDER — OXYCODONE-ACETAMINOPHEN 5-325 MG PO TABS: 1.0000 | ORAL_TABLET | Freq: Four times a day (QID) | ORAL | 0 refills | Status: DC | PRN

## 2018-08-12 NOTE — Patient Instructions (Signed)
Take the antibiotics and the pain medicine as prescribed.  Follow-up at your scheduled appointment.

## 2018-08-12 NOTE — Progress Notes (Signed)
    Love Chowning Niedermeier 09-07-1970 [id number]        48 y.o.  [id number] presents 3 weeks postop status post TAH/BSO complaining of abdominal pain with bowel movements and pelvic pressure.  Also having vaginal bleeding that comes and goes.  Having bowel movements several times daily.  No nausea or vomiting.  Eating without difficulty.  Voiding without difficulty.  No fevers or chills.  Past medical history,surgical history, problem list, medications, allergies, family history and social history were all reviewed and documented in the EPIC chart.  Directed ROS with pertinent positives and negatives documented in the history of present illness/assessment and plan.  Exam: [name] assistant Vitals:   08/12/18 0910  BP: 118/78  Temp: 98.7 F (37.1 C)  TempSrc: Oral   General appearance:  Normal Abdomen soft nontender without masses guarding rebound Pelvic external BUS vagina with dark blood ooze from vaginal cuff.  Probing of vaginal cuff feels an opening left side.  Not deeply probed.  Bimanual exam without definitive masses or significant tenderness.  Rectal exam without masses or significant tenderness.  Assessment/Plan:  48 y.o. [id number] with pelvic pressure and pain with bowel movements.  Ooze from the left vaginal cuff which feels to be slightly opened.  I suspect that she has a cuff hematoma that has now opened and is draining.  She is not actively bleeding but more of an old ooze.  She otherwise appears to be doing well without evidence of infection.  She is eating, having bowel movements and voiding without difficulty.  I am going to cover her with antibiotic to prevent an infection and add narcotic pain medication as she is using ibuprofen 800 right now it does not seem to cover the pain all the time.  Assuming she improves with decreasing pain and bleeding then she will follow-up at her scheduled appointment in 2 weeks.  We also discussed whether imaging at this time would be beneficial and  I am not sure that I would act upon imaging in that if we saw a collection at the cuff I would not pursue it but allow for spontaneous resolution.    Anastasio Auerbach MD, 9:29 AM 08/12/2018

## 2018-08-14 ENCOUNTER — Other Ambulatory Visit: Payer: Self-pay

## 2018-08-18 ENCOUNTER — Encounter: Payer: Self-pay | Admitting: Gynecology

## 2018-08-18 ENCOUNTER — Ambulatory Visit (INDEPENDENT_AMBULATORY_CARE_PROVIDER_SITE_OTHER): Payer: Medicaid Other | Admitting: Gynecology

## 2018-08-18 ENCOUNTER — Other Ambulatory Visit: Payer: Self-pay

## 2018-08-18 VITALS — BP 118/76

## 2018-08-18 DIAGNOSIS — Z09 Encounter for follow-up examination after completed treatment for conditions other than malignant neoplasm: Secondary | ICD-10-CM

## 2018-08-18 MED ORDER — CIPROFLOXACIN HCL 250 MG PO TABS: 250.0000 mg | ORAL_TABLET | Freq: Two times a day (BID) | ORAL | 0 refills | Status: DC

## 2018-08-18 MED ORDER — METRONIDAZOLE 500 MG PO TABS: 500.0000 mg | ORAL_TABLET | Freq: Two times a day (BID) | ORAL | 0 refills | Status: DC

## 2018-08-18 NOTE — Patient Instructions (Addendum)
Follow-up in 2 weeks for postop visit.  Continue your antibiotics twice daily

## 2018-08-18 NOTE — Progress Notes (Signed)
    Susan Sanders [birth year] [id number]        47 y.o.  [id number] presents for her 4-week postoperative visit status post TAH/BSO.  Was seen last week with increasing lower abdominal pressure and pain with bowel movements.  Also vaginal bleeding.  Exam showed vaginal cuff was open a fingertip with old blood draining.  Suspected to have a cuff hematoma which was now draining.  Was placed on antibiotics prophylactically to include ciprofloxacin 250 mg twice daily and Flagyl 500 mg twice daily due to penicillin allergy.  Reports feeling great having regular bowel movements without discomfort and having no lower abdominal pain.  Eating, drinking, voiding without difficulty  Past medical history,surgical history, problem list, medications, allergies, family history and social history were all reviewed and documented in the EPIC chart.  Directed ROS with pertinent positives and negatives documented in the history of present illness/assessment and plan.  Exam: [name] assistant Vitals:   08/18/18 0825  BP: 118/76   General appearance:  Normal Abdomen soft nontender without masses guarding rebound.  Incision healed nicely Pelvic external BUS vagina with old dark blood.  Bimanual exam with cuff admitting a fingertip with old blood.  Minimal tenderness on bimanual  Assessment/Plan:  48 y.o. [id number] 4 weeks postop with probable cuff hematoma now draining.  Patient feels 100% better than she did last week.  We will continue her prophylactic antibiotics over this coming week for a 2-week course.  She will follow-up in 2 weeks for postop checkup, sooner if any issues.    Anastasio Auerbach MD, 8:36 AM 08/18/2018

## 2018-08-29 ENCOUNTER — Other Ambulatory Visit: Payer: Self-pay

## 2018-09-01 ENCOUNTER — Encounter: Payer: Self-pay | Admitting: Gynecology

## 2018-09-01 ENCOUNTER — Ambulatory Visit (INDEPENDENT_AMBULATORY_CARE_PROVIDER_SITE_OTHER): Payer: Medicaid Other | Admitting: Gynecology

## 2018-09-01 ENCOUNTER — Other Ambulatory Visit: Payer: Self-pay

## 2018-09-01 VITALS — BP 126/80

## 2018-09-01 DIAGNOSIS — Z4889 Encounter for other specified surgical aftercare: Secondary | ICD-10-CM

## 2018-09-01 NOTE — Progress Notes (Signed)
    Susan Sanders 1970-10-21 [id number]        48 y.o.  [id number] presents for postoperative visit.  Status post TAH/BSO.  Postoperatively had oozing from the vaginal cuff after heavy straining with constipation.  Had continued to ooze on and off since with exam showing left side of vaginal cuff appears open.  Most likely felt secondary to cuff hematoma with drainage.  Has done well since her last visit 2 weeks ago with minimal discomfort and no further bleeding/drainage.  Past medical history,surgical history, problem list, medications, allergies, family history and social history were all reviewed and documented in the EPIC chart.  Directed ROS with pertinent positives and negatives documented in the history of present illness/assessment and plan.  Exam: Susan Sanders assistant Vitals:   09/01/18 0952  BP: 126/80   General appearance:  Normal Abdomen soft nontender without masses guarding rebound Pelvic external BUS vagina with cuff healing and no palpable defects.  No bleeding or other drainage.  Bimanual without masses or tenderness  Assessment/Plan:  48 y.o. [id number] status post TAH/BSO 07/21/2018.  She is doing well now with cuff closing and healing.  She will slowly resume normal activities with the exception of pelvic rest and follow-up in 2 weeks.    Susan Auerbach MD, 10:04 AM 09/01/2018

## 2018-09-01 NOTE — Patient Instructions (Signed)
Follow-up in 2 weeks for postop visit

## 2018-09-11 ENCOUNTER — Other Ambulatory Visit: Payer: Self-pay | Admitting: Surgery

## 2018-09-11 DIAGNOSIS — D241 Benign neoplasm of right breast: Secondary | ICD-10-CM

## 2018-09-16 ENCOUNTER — Ambulatory Visit: Payer: Medicaid Other | Admitting: Gynecology

## 2018-09-30 ENCOUNTER — Ambulatory Visit (HOSPITAL_BASED_OUTPATIENT_CLINIC_OR_DEPARTMENT_OTHER): Admission: RE | Admit: 2018-09-30 | Payer: BLUE CROSS/BLUE SHIELD | Source: Home / Self Care | Admitting: Surgery

## 2018-09-30 SURGERY — BREAST LUMPECTOMY WITH RADIOACTIVE SEED LOCALIZATION
Anesthesia: General | Laterality: Right

## 2018-10-21 ENCOUNTER — Ambulatory Visit (INDEPENDENT_AMBULATORY_CARE_PROVIDER_SITE_OTHER): Payer: Self-pay | Admitting: Family Medicine

## 2018-10-21 ENCOUNTER — Encounter: Payer: Self-pay | Admitting: Family Medicine

## 2018-10-21 ENCOUNTER — Other Ambulatory Visit: Payer: Self-pay

## 2018-10-21 VITALS — BP 158/90 | HR 93

## 2018-10-21 DIAGNOSIS — M545 Low back pain, unspecified: Secondary | ICD-10-CM

## 2018-10-21 MED ORDER — TIZANIDINE HCL 4 MG PO TABS: 4.0000 mg | ORAL_TABLET | Freq: Four times a day (QID) | ORAL | 0 refills | Status: DC | PRN

## 2018-10-21 NOTE — Assessment & Plan Note (Signed)
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  Says she feels like a muscle was pulled.  She has been trying ibuprofen 800 mg with some improvement but not all the way.  Feels that back is tight.  Physical exam is consistent with this.  There is no red flags.  Trial of muscle relaxer in addition to ibuprofen.  Patient follow-up PRN

## 2018-10-21 NOTE — Patient Instructions (Signed)
Acute Back Pain, Adult  Acute back pain is sudden and usually short-lived. It is often caused by an injury to the muscles and tissues in the back. The injury may result from:   A muscle or ligament getting overstretched or torn (strained). Ligaments are tissues that connect bones to each other. Lifting something improperly can cause a back strain.   Wear and tear (degeneration) of the spinal disks. Spinal disks are circular tissue that provides cushioning between the bones of the spine (vertebrae).   Twisting motions, such as while playing sports or doing yard work.   A hit to the back.   Arthritis.  You may have a physical exam, lab tests, and imaging tests to find the cause of your pain. Acute back pain usually goes away with rest and home care.  Follow these instructions at home:  Managing pain, stiffness, and swelling   Take over-the-counter and prescription medicines only as told by your health care provider.   Your health care provider may recommend applying ice during the first 24-48 hours after your pain starts. To do this:  ? Put ice in a plastic bag.  ? Place a towel between your skin and the bag.  ? Leave the ice on for 20 minutes, 2-3 times a day.   If directed, apply heat to the affected area as often as told by your health care provider. Use the heat source that your health care provider recommends, such as a moist heat pack or a heating pad.  ? Place a towel between your skin and the heat source.  ? Leave the heat on for 20-30 minutes.  ? Remove the heat if your skin turns bright red. This is especially important if you are unable to feel pain, heat, or cold. You have a greater risk of getting burned.  Activity     Do not stay in bed. Staying in bed for more than 1-2 days can delay your recovery.   Sit up and stand up straight. Avoid leaning forward when you sit, or hunching over when you stand.  ? If you work at a desk, sit close to it so you do not need to lean over. Keep your chin tucked  in. Keep your neck drawn back, and keep your elbows bent at a right angle. Your arms should look like the letter "L."  ? Sit high and close to the steering wheel when you drive. Add lower back (lumbar) support to your car seat, if needed.   Take short walks on even surfaces as soon as you are able. Try to increase the length of time you walk each day.   Do not sit, drive, or stand in one place for more than 30 minutes at a time. Sitting or standing for long periods of time can put stress on your back.   Do not drive or use heavy machinery while taking prescription pain medicine.   Use proper lifting techniques. When you bend and lift, use positions that put less stress on your back:  ? Bend your knees.  ? Keep the load close to your body.  ? Avoid twisting.   Exercise regularly as told by your health care provider. Exercising helps your back heal faster and helps prevent back injuries by keeping muscles strong and flexible.   Work with a physical therapist to make a safe exercise program, as recommended by your health care provider. Do any exercises as told by your physical therapist.  Lifestyle   Maintain   a healthy weight. Extra weight puts stress on your back and makes it difficult to have good posture.   Avoid activities or situations that make you feel anxious or stressed. Stress and anxiety increase muscle tension and can make back pain worse. Learn ways to manage anxiety and stress, such as through exercise.  General instructions   Sleep on a firm mattress in a comfortable position. Try lying on your side with your knees slightly bent. If you lie on your back, put a pillow under your knees.   Follow your treatment plan as told by your health care provider. This may include:  ? Cognitive or behavioral therapy.  ? Acupuncture or massage therapy.  ? Meditation or yoga.  Contact a health care provider if:   You have pain that is not relieved with rest or medicine.   You have increasing pain going down  into your legs or buttocks.   Your pain does not improve after 2 weeks.   You have pain at night.   You lose weight without trying.   You have a fever or chills.  Get help right away if:   You develop new bowel or bladder control problems.   You have unusual weakness or numbness in your arms or legs.   You develop nausea or vomiting.   You develop abdominal pain.   You feel faint.  Summary   Acute back pain is sudden and usually short-lived.   Use proper lifting techniques. When you bend and lift, use positions that put less stress on your back.   Take over-the-counter and prescription medicines and apply heat or ice as directed by your health care provider.  This information is not intended to replace advice given to you by your health care provider. Make sure you discuss any questions you have with your health care provider.  Document Released: 04/30/2005 Document Revised: 12/05/2017 Document Reviewed: 12/12/2016  Elsevier Interactive Patient Education  2019 [employer]

## 2018-10-21 NOTE — Progress Notes (Signed)
    Subjective:  Susan Sanders is a 48 y.o. female who presents to the [facility] today with a chief complaint of back pain.   HPI: BACK PAIN  Pain is described as "something pulled", "tight" shoulders.  Patient has tried biofreeze, tylenol, ibuprofen 800 mg. Pain radiates from lower back to shoulders. History of trauma or injury: yes Patient believes might be causing their pain: Has had chronic back pain, but worse over past week. 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   Prior history of similar pain: yes History of cancer: no Weak immune system:  no History of IV drug use: no History of steroid use: no  Symptoms Incontinence of bowel or bladder:  no Numbness of leg: no Fever: no Rest or Night pain: no Weight Loss:  no Rash: no   ROS see HPI Smoking Status noted.     Objective:  Physical Exam: BP (!) 158/90   Pulse 93   LMP 06/24/2018   SpO2 98%   Gen: NAD, resting comfortably CV: RRR with no murmurs appreciated Pulm: NWOB, CTAB with no crackles, wheezes, or rhonchi GI: Normal bowel sounds present. Soft, Nontender, Nondistended. MSK: no edema, cyanosis, or clubbing noted Skin: warm, dry Neuro: grossly normal, moves all extremities Psych: Normal affect and thought content  No results found for this or any previous visit (from the past 72 hour(s)).   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  Says she feels like a muscle was pulled.  She has been trying ibuprofen 800 mg with some improvement but not all the way.  Feels that back is tight.  Physical exam is consistent with this.  There is no red flags.  Trial of muscle relaxer in addition to ibuprofen.  Patient follow-up PRN   Lab Orders  No laboratory test(s) ordered today    Meds ordered this encounter  Medications  . tiZANidine (ZANAFLEX) 4 MG tablet    Sig: Take 1 tablet (4 mg total) by mouth every 6  (six) hours as needed for muscle spasms.    Dispense:  30 tablet    Refill:  0      Marny Lowenstein, MD, MS FAMILY MEDICINE RESIDENT - PGY2 10/21/2018 3:05 PM

## 2018-10-24 ENCOUNTER — Ambulatory Visit (INDEPENDENT_AMBULATORY_CARE_PROVIDER_SITE_OTHER): Payer: Self-pay | Admitting: Family Medicine

## 2018-10-24 ENCOUNTER — Other Ambulatory Visit: Payer: Self-pay

## 2018-10-24 DIAGNOSIS — M545 Low back pain, unspecified: Secondary | ICD-10-CM

## 2018-10-24 NOTE — Patient Instructions (Signed)

## 2018-10-24 NOTE — Progress Notes (Signed)
   Subjective:   Patient ID: Susan Sanders    DOB: January 24, 1971, 48 y.o. female   MRN: [id number]  CC: low back pain  HPI: Susan Sanders is a 48 y.o. female who presents to clinic today for the following issue.  Low back pain  Longstanding issue. She reports she saw Dr. Grandville Silos on 10/21/18, was prescribed zanaflex.  She has been taking this along with ibuprofen but this has not been helping.  Can't get out of bed even with muscle relaxants and ibuprofen.  She recently was moving a patient and feels as though she pulled a muscle in her back.  No numbness, tingling or weakness in legs.  Pain does not radiate, constant and 10/10 at it's worst.  Worse with standing and prolonged sitting.  Better with heat and ice application.  No loss of bowel or bladder control.  She was previously on gabapentin for low back pain which helped and wants to know if she can try this again.   ROS: See HPI for pertinent ROS.  [place]: Pertinent past medical, surgical, family, and social history were reviewed and updated as appropriate. Smoking status reviewed.  Medications reviewed. Objective:   BP (!) 142/88   Pulse 95   LMP 06/24/2018   SpO2 99%  Vitals and nursing note reviewed.  General: 48 yo female, NAD  Neck: supple   CV: RRR no MRG  Lungs: CTAB, normal effort  Lumbar spine: no obvious deformity noted, no erythema, swelling or bruising noted, normal ROM with flexion and extension, mild TTP over paraspinal musculature bilaterally at L4 level. Strength 5/5 in bilateral LE, gait intact, normal sensation.  Skin: warm, dry, no rash Neuro: alert, oriented x3. Negative straight leg testing bilaterally   Assessment & Plan:   Back pain Likely lumbar sprain, no improvement with ibuprofen and muscle relaxant.  No red flags on history or exam.  Advised continue using muscle relaxant in combination with heating pad and gentle stretches for lower back.  If no improvement with this, could trial gabapentin again  as this has helped with her chronic back pain in the past.   Lovenia Kim, MD [facility] PGY-3

## 2018-10-25 ENCOUNTER — Encounter: Payer: Self-pay | Admitting: Family Medicine

## 2018-10-25 NOTE — Assessment & Plan Note (Signed)
Likely lumbar sprain, no improvement with ibuprofen and muscle relaxant.  No red flags on history or exam.  Advised continue using muscle relaxant in combination with heating pad and gentle stretches for lower back.  If no improvement with this, could trial gabapentin again as this has helped with her chronic back pain in the past.

## 2018-12-23 ENCOUNTER — Other Ambulatory Visit: Payer: Self-pay

## 2018-12-23 ENCOUNTER — Telehealth: Payer: Self-pay | Admitting: Family Medicine

## 2018-12-23 ENCOUNTER — Telehealth (INDEPENDENT_AMBULATORY_CARE_PROVIDER_SITE_OTHER): Payer: Self-pay | Admitting: Family Medicine

## 2018-12-23 DIAGNOSIS — Z20822 Contact with and (suspected) exposure to covid-19: Secondary | ICD-10-CM

## 2018-12-23 DIAGNOSIS — R6889 Other general symptoms and signs: Secondary | ICD-10-CM

## 2018-12-23 NOTE — Telephone Encounter (Signed)
Will forward to Dr. Kris Mouton.  Areen Trautner,CMA

## 2018-12-23 NOTE — Progress Notes (Signed)
[place] Telemedicine Visit  Patient consented to have virtual visit. Method of visit: Telephone  Encounter participants: Patient: Susan Sanders - located at home Provider: Guadalupe Dawn - located at [facility] Others (if applicable):   Chief Complaint: cough, fever  HPI: 48 year old female who presents as a telemedicine visit for 2 day history of fever, chills, cough, shortness of breath at night, nausea, headache. She does not endorse any loss of taste or smell. 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 She believes that her symptoms may be related.  ROS: per HPI  Pertinent PMHx: non-contributory  Exam:  General: no distress, pleasant Respiratory: able to speak in clear, coherent sentences. No distress Psych: pleasant, coherent thought process  Assessment/Plan:  Suspected Covid-19 Virus Infection High suspicion for covid-19 based off of symptoms and history. Instructed patient to quarantine at home until her test returns. Placed orders for outpatient covid swab. Wrote letter to patient's employer asking for her to be excused until testing results. Discussed when to come to the hospital and alarm symptoms. Patient in agreement.    Time spent during visit with patient: 1 minutes

## 2018-12-23 NOTE — Telephone Encounter (Signed)
Pt had a virtual visit with Dr. Kris Mouton this morning. She informed him that she would call back with her employers fax number.   The best fax number is [phone]  Employer is [employer] in [place].

## 2018-12-24 DIAGNOSIS — Z20822 Contact with and (suspected) exposure to covid-19: Secondary | ICD-10-CM | POA: Insufficient documentation

## 2018-12-24 NOTE — Telephone Encounter (Signed)
Placed letter in outgoing fax pile late afternoon on 12/23/2018.  Guadalupe Dawn MD PGY-3 Family Medicine Resident

## 2018-12-24 NOTE — Assessment & Plan Note (Signed)
High suspicion for covid-19 based off of symptoms and history. Instructed patient to quarantine at home until her test returns. Placed orders for outpatient covid swab. Wrote letter to patient's employer asking for her to be excused until testing results. Discussed when to come to the hospital and alarm symptoms. Patient in agreement.

## 2018-12-25 ENCOUNTER — Telehealth: Payer: Self-pay | Admitting: *Deleted

## 2018-12-25 LAB — NOVEL CORONAVIRUS, NAA: SARS-CoV-2, NAA: NOT DETECTED

## 2018-12-25 NOTE — Telephone Encounter (Signed)
Called patient and informed her that test was negative for Covid-19 per Dr. Kris Mouton.  Patient was appreciative.  Susan Sanders, [place]

## 2018-12-25 NOTE — Progress Notes (Signed)
Please let patient know that her covid 19 was negative. She can return to work whenever she feels able

## 2018-12-25 NOTE — Telephone Encounter (Signed)
Pt is calling for results. Christen Bame, CMA

## 2019-01-13 ENCOUNTER — Other Ambulatory Visit: Payer: Self-pay

## 2019-01-13 ENCOUNTER — Telehealth: Payer: Self-pay | Admitting: Family Medicine

## 2019-01-13 ENCOUNTER — Encounter: Payer: Self-pay | Admitting: Family Medicine

## 2019-01-13 ENCOUNTER — Telehealth (INDEPENDENT_AMBULATORY_CARE_PROVIDER_SITE_OTHER): Payer: Self-pay | Admitting: Family Medicine

## 2019-01-13 VITALS — BP 189/90 | Temp 99.8°F

## 2019-01-13 DIAGNOSIS — Z20822 Contact with and (suspected) exposure to covid-19: Secondary | ICD-10-CM

## 2019-01-13 DIAGNOSIS — Z20828 Contact with and (suspected) exposure to other viral communicable diseases: Secondary | ICD-10-CM

## 2019-01-13 DIAGNOSIS — G4489 Other headache syndrome: Secondary | ICD-10-CM

## 2019-01-13 NOTE — Progress Notes (Signed)
Patient ID: Susan Sanders, female   DOB: Mar 27, 1971, 48 y.o.   MRN: [id number] 2nd call attempted, no response. HIPAA compliant callback message left. Please advise ED visit if he continue to have headache that does not respond to NSAID or tylenol. Thanks.

## 2019-01-13 NOTE — Progress Notes (Signed)
First call attempt

## 2019-01-13 NOTE — Telephone Encounter (Signed)
Will forward to Dr. Gwendlyn Deutscher who was seeing patient this morning.  Jazmin Hartsell,CMA

## 2019-01-13 NOTE — Addendum Note (Signed)
Addended by: Andrena Mews T on: 01/13/2019 12:11 PM   Modules accepted: Orders, Level of Service

## 2019-01-13 NOTE — Progress Notes (Addendum)
[facility] Telemedicine Visit Patient contacted again.  Patient consented to have virtual visit. Method of visit: Telephone  Encounter participants: Patient: Susan Sanders - located at home Provider: Andrena Mews - located at Office Others (if applicable): N/A  Chief Complaint: Headache, ache, diarrhea   Headache  This is a new problem. Episode onset: 4 days ago. The problem occurs intermittently. The pain is located in the frontal and bilateral region. The pain radiates to the face. The pain quality is not similar to prior headaches. The quality of the pain is described as aching. The pain is at a severity of 8/10. The pain is moderate. Associated symptoms include a fever and muscle aches. Pertinent negatives include no coughing, drainage, loss of balance, phonophobia, sore throat or vomiting. Associated symptoms comments: Intermittent chest pain when she moves around but improves with rest. Nothing aggravates the symptoms. She has tried NSAIDs for the symptoms. The treatment provided moderate relief. Her past medical history is significant for hypertension. There is no history of migraine headaches.  Diarrhea  This is a new problem. Episode onset: Started 4 days ago. The problem occurs 2 to 4 times per day. The problem has been unchanged. Associated symptoms include a fever and headaches. Pertinent negatives include no coughing or vomiting. Nothing aggravates the symptoms. Risk factors: COVID-19 exposure at her job. She has tried nothing for the symptoms. The treatment provided moderate relief.  HTN: She took her BP this morning before taking her meds and it was high. She has not rechecked since then.    ROS: per HPI  Pertinent PMHx:  Current Outpatient Medications on File Prior to Visit  Medication Sig Dispense Refill  . amLODipine (NORVASC) 10 MG tablet Take 1 tablet by mouth once daily for 30 days 90 tablet 3  . chlorthalidone (HYGROTON) 50 MG tablet  TAKE 1 TABLET BY MOUTH ONCE DAILY 30 tablet 0  . ibuprofen (ADVIL,MOTRIN) 800 MG tablet Take 1 tablet (800 mg total) by mouth 3 (three) times daily. 30 tablet 2  . acetaminophen (TYLENOL) 500 MG tablet Take 2 tablets (1,000 mg total) by mouth every 6 (six) hours as needed for mild pain or fever. 30 tablet 0  . docusate sodium (COLACE) 250 MG capsule Take 1 capsule (250 mg total) by mouth daily. 10 capsule 0  . ferrous sulfate 325 (65 FE) MG tablet Take 325 mg by mouth 2 (two) times daily with a meal.    . metroNIDAZOLE (FLAGYL) 500 MG tablet Take 1 tablet (500 mg total) by mouth 2 (two) times daily. For 7 days.  Avoid alcohol while taking 14 tablet 0  . polyethylene glycol (MIRALAX) packet Take 17 g by mouth daily. 14 each 0  . tiZANidine (ZANAFLEX) 4 MG tablet Take 1 tablet (4 mg total) by mouth every 6 (six) hours as needed for muscle spasms. (Patient not taking: Reported on 01/13/2019) 30 tablet 0   No current facility-administered medications on file prior to visit.    Past Medical History:  Diagnosis Date  . Anemia   . Anxiety   . Cellulitis   . Chest pain   . Childhood asthma   . Chronic back pain    "all over" (05/29/2018)  . Complication of anesthesia    "difficult waking up " (03/30/2019)  . Depression   . Eczema   . Hypertension   . Scoliosis      Exam:  Respiratory: Not in distress  Assessment/Plan:  Headache: ?? Viral infection  Use Ibuprofen as needed.                   ED visit recommended if persistent.                   I advised her to go to the ED as well for chest pain and she said she does not have it now. She will if it reoccurs.  Myalgia: ?? Virus. Use Ibuprofen or Tylenol as needed. Keep well hydrated.  COVID-19 exposure: Had a recent neg test. She however endorsed recent exposure and she is symptomatic. She will like to get tested again. Test ordered. I advised her to go to the [facility] test site at [address] between 8  AM and 3:30 PM. She verbalized understanding.  Continue COVID-19 precautions in the mean time.  HTN: She will take her meds and was advised to see PCP soon for BP management.  Time spent during visit with patient: 20 minutes

## 2019-01-13 NOTE — Telephone Encounter (Signed)
Please try patient again as she missed your call and would like to get corona virus testing, # [phone].

## 2019-01-14 LAB — NOVEL CORONAVIRUS, NAA: SARS-CoV-2, NAA: NOT DETECTED

## 2019-01-15 ENCOUNTER — Encounter: Payer: Self-pay | Admitting: Family Medicine

## 2019-01-15 NOTE — Progress Notes (Signed)
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 This specific resident was placed in the covid unit they have in their facility. Patient states that she would like to stay out until Monday. She is feeling better but wants to make sure that she continues to. 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 This specific resident was placed in the covid unit they have in their facility. Patient states that she would like to stay out until Monday. She is feeling better but wants to make sure that she continues to. Johnney Ou   ------   Notes recorded by Valerie Roys, CMA on 01/14/2019 at 2:49 PM EDT  Patient informed and states that she needs a letter to return to work tomorrow. She is feeling better, states that she got a lot of rest yesterday. Will forward to MD. Johnney Ou   ------   Notes recorded by Kinnie Feil, MD on 01/14/2019 at 2:45 PM EDT  Please call patient and let her know that her COVID test result is negative. Thanks.  ------   Notes recorded by Elliot Cousin, RN on 01/14/2019 at 12:59 PM EDT  Attempted to reach pt x2 for lab results; busy signal  Novel Coronavirus, NAA ([facility]) Order: [id number] Status:  Final result Visible to patient:  No (not released) Dx:  Suspected 2019 Novel Coronavirus Infe... Specimen Information: Oropharyngeal(OP) collection in vial transport medium   OROPHARYNGEA TESTING      Ref Range & Units 2d ago 3wk ago  SARS-CoV-2, NAA Not Detected Not Detected  Not Detected CM   Comment: This  nucleic acid amplification test was developed and its perfomance  characteristics determined by [employer]. Nucleic acid  amplification tests include PCR and TMA. This test has not been FDA  cleared or approved. This test has been authorized by FDA under an  Emergency Use Authorization (EUA). This test is only authorized for  the duration of time the declaration that circumstances exist  justifying the authorization of the emergency use of in vitro  diagnostic tests for detection of SARS-CoV-2 virus and/or diagnosis  of COVID-19 infection under section 564(b)(1) of the Act, 21 U.S.C.  PT:2852782) (1), unless the authorization is terminated or revoked  sooner.  When diagnostic testing is negative, the possibility of a false  negative result should be considered in the context of a patient's  recent exposures and the presence of clinical signs and symptoms  consistent with COVID-19. An individual without symptoms of COVID-19  and who is not shedding SARS-CoV-2 virus would expect to have a  negative (not detected) result in this assay.   Resulting Agency  [facility]    Narrative Performed by: [facility] Performed at: [address]  [address] [place], [place] [id number]  Lab Director: Rush Farmer MD, Phone: [phone]    Specimen Collected: 01/13/19 13:01

## 2019-02-04 ENCOUNTER — Encounter: Payer: Self-pay | Admitting: Gynecology

## 2019-04-05 ENCOUNTER — Emergency Department (HOSPITAL_COMMUNITY)
Admission: EM | Admit: 2019-04-05 | Discharge: 2019-04-05 | Disposition: A | Discharge status: Home / Self Care | Payer: Medicaid Other | Attending: Emergency Medicine | Admitting: Emergency Medicine

## 2019-04-05 ENCOUNTER — Encounter (HOSPITAL_COMMUNITY): Payer: Self-pay

## 2019-04-05 ENCOUNTER — Other Ambulatory Visit: Payer: Self-pay

## 2019-04-05 DIAGNOSIS — Z20828 Contact with and (suspected) exposure to other viral communicable diseases: Secondary | ICD-10-CM | POA: Insufficient documentation

## 2019-04-05 DIAGNOSIS — R5383 Other fatigue: Secondary | ICD-10-CM | POA: Insufficient documentation

## 2019-04-05 DIAGNOSIS — R519 Headache, unspecified: Secondary | ICD-10-CM | POA: Insufficient documentation

## 2019-04-05 DIAGNOSIS — Z79899 Other long term (current) drug therapy: Secondary | ICD-10-CM | POA: Insufficient documentation

## 2019-04-05 DIAGNOSIS — Z87891 Personal history of nicotine dependence: Secondary | ICD-10-CM | POA: Insufficient documentation

## 2019-04-05 DIAGNOSIS — M791 Myalgia, unspecified site: Secondary | ICD-10-CM | POA: Insufficient documentation

## 2019-04-05 DIAGNOSIS — Z91040 Latex allergy status: Secondary | ICD-10-CM | POA: Insufficient documentation

## 2019-04-05 DIAGNOSIS — Z20822 Contact with and (suspected) exposure to covid-19: Secondary | ICD-10-CM

## 2019-04-05 DIAGNOSIS — R197 Diarrhea, unspecified: Secondary | ICD-10-CM | POA: Insufficient documentation

## 2019-04-05 DIAGNOSIS — I10 Essential (primary) hypertension: Secondary | ICD-10-CM | POA: Insufficient documentation

## 2019-04-05 DIAGNOSIS — R112 Nausea with vomiting, unspecified: Secondary | ICD-10-CM | POA: Insufficient documentation

## 2019-04-05 LAB — COMPREHENSIVE METABOLIC PANEL
ALT: 24 U/L (ref 0–44)
AST: 24 U/L (ref 15–41)
Albumin: 4.4 g/dL (ref 3.5–5.0)
Alkaline Phosphatase: 87 U/L (ref 38–126)
Anion gap: 11 (ref 5–15)
BUN: 10 mg/dL (ref 6–20)
CO2: 26 mmol/L (ref 22–32)
Calcium: 9.6 mg/dL (ref 8.9–10.3)
Chloride: 103 mmol/L (ref 98–111)
Creatinine, Ser: 0.91 mg/dL (ref 0.44–1.00)
GFR calc Af Amer: >60 mL/min (ref >60)
GFR calc non Af Amer: >60 mL/min (ref >60)
Glucose, Bld: 81 mg/dL (ref 70–99)
Potassium: 3.5 mmol/L (ref 3.5–5.1)
Sodium: 140 mmol/L (ref 135–145)
Total Bilirubin: 0.4 mg/dL (ref 0.3–1.2)
Total Protein: 8.2 g/dL — ABNORMAL HIGH (ref 6.5–8.1)

## 2019-04-05 LAB — LIPASE, BLOOD: Lipase: 51 U/L (ref 11–51)

## 2019-04-05 LAB — CBC
HCT: 41.2 % (ref 36.0–46.0)
Hemoglobin: 12.8 g/dL (ref 12.0–15.0)
MCH: 25.8 pg — ABNORMAL LOW (ref 26.0–34.0)
MCHC: 31.1 g/dL (ref 30.0–36.0)
MCV: 82.9 fL (ref 80.0–100.0)
Platelets: 421 10*3/uL — ABNORMAL HIGH (ref 150–400)
RBC: 4.97 MIL/uL (ref 3.87–5.11)
RDW: 14.6 % (ref 11.5–15.5)
WBC: 7.9 10*3/uL (ref 4.0–10.5)
nRBC: 0 % (ref 0.0–0.2)

## 2019-04-05 LAB — I-STAT BETA HCG BLOOD, ED (MC, WL, AP ONLY): I-stat hCG, quantitative: <5 m[IU]/mL (ref <5)

## 2019-04-05 LAB — URINALYSIS, ROUTINE W REFLEX MICROSCOPIC
Bilirubin Urine: NEGATIVE
Glucose, UA: NEGATIVE mg/dL
Hgb urine dipstick: NEGATIVE
Ketones, ur: NEGATIVE mg/dL
Leukocytes,Ua: NEGATIVE
Nitrite: NEGATIVE
Protein, ur: NEGATIVE mg/dL
Specific Gravity, Urine: 1.016 (ref 1.005–1.030)
pH: 7 (ref 5.0–8.0)

## 2019-04-05 LAB — INFLUENZA PANEL BY PCR (TYPE A & B)
Influenza A By PCR: NEGATIVE
Influenza B By PCR: NEGATIVE

## 2019-04-05 LAB — POC SARS CORONAVIRUS 2 AG -  ED: SARS Coronavirus 2 Ag: NEGATIVE

## 2019-04-05 MED ORDER — SODIUM CHLORIDE 0.9% FLUSH
3.0000 mL | Freq: Once | INTRAVENOUS | Status: AC
  Administered 2019-04-05: 3 mL via INTRAVENOUS

## 2019-04-05 MED ORDER — METOCLOPRAMIDE HCL 5 MG/ML IJ SOLN
10.0000 mg | Freq: Once | INTRAMUSCULAR | Status: AC
  Administered 2019-04-05: 10 mg via INTRAVENOUS
  Filled 2019-04-05: qty 2

## 2019-04-05 MED ORDER — SODIUM CHLORIDE 0.9 % IV BOLUS
1000.0000 mL | Freq: Once | INTRAVENOUS | Status: AC
  Administered 2019-04-05: 1000 mL via INTRAVENOUS

## 2019-04-05 MED ORDER — DIPHENHYDRAMINE HCL 50 MG/ML IJ SOLN
25.0000 mg | Freq: Once | INTRAMUSCULAR | Status: AC
  Administered 2019-04-05: 25 mg via INTRAVENOUS
  Filled 2019-04-05: qty 1

## 2019-04-05 MED ORDER — ACETAMINOPHEN 325 MG PO TABS
650.0000 mg | ORAL_TABLET | Freq: Once | ORAL | Status: AC
  Administered 2019-04-05: 650 mg via ORAL
  Filled 2019-04-05: qty 2

## 2019-04-05 NOTE — ED Provider Notes (Signed)
[facility] DEPT Provider Note   CSN: [id number] Arrival date & time: 04/05/19  1051     History   Chief Complaint Chief Complaint  Patient presents with   Emesis   Headache    HPI Susan Sanders is a 48 y.o. female past medical history significant for anxiety, anemia, hypertension, chronic back pain presents to emergency department today with chief complaint of Covid-like symptoms x2 days.  Patient reports she has had generalized body aches, fatigue, headache, nausea with emesis.  She reports 5 episodes of nonbloody nonbilious emesis.  She has been unable to tolerate p.o. intake.  She states her headache has progressively worsened since onset.  She reports this headache feels like ones she has had in the past.  Pain is located throughout her entire head. She rates pain 9/10 in severity.  She is also endorsing diarrhea.  She is an 45 episodes in last 24 hours.  She denies any blood in stool. Denies any suspicious food intake.  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  She had a covid test x3 days ago after work exposure that was negative. Her symptoms started day after Covid testing.  Denies fever, chills, neck pain, rash, visual changes, cough, congestion, chest pain, urinary symptoms.   Past Medical History:  Diagnosis Date   Anemia    Anxiety    Cellulitis    Chest pain    Childhood asthma    Chronic back pain    "all over" (123456)   Complication of anesthesia    "difficult waking up " (03/30/2019)   Depression    Eczema    Hypertension    Scoliosis     Patient Active Problem List   Diagnosis Date Noted   Suspected COVID-19 virus infection 12/24/2018   Leiomyoma of body of uterus 07/21/2018   Leiomyoma 07/21/2018   Generalized anxiety disorder 06/03/2018   Uterine leiomyoma 06/03/2018   Hyponatremia 06/03/2018   Abdominal pain  05/29/2018   Overweight 11/02/2016   Muscle weakness (generalized)    Syncope and collapse 02/04/2016   Essential hypertension 07/14/2015   Cervical spondylolysis 06/18/2014   Healthcare maintenance 12/10/2013   Former smoker 12/10/2013   GERD (gastroesophageal reflux disease) 12/10/2013   Fatigue 09/09/2013   Pain, joint, hand, right 05/20/2013   Leg pain, bilateral 04/22/2012   Dyshidrotic eczema 12/26/2011   Back pain 12/05/2010   ALLERGIC RHINITIS 08/20/2008   Menorrhagia with regular cycle 03/20/2007   MIGRAINE HEADACHE 02/20/2007   Anxiety and depression 01/23/2007    Past Surgical History:  Procedure Laterality Date   BREAST SURGERY     breast biopsy-right breast   HYSTERECTOMY ABDOMINAL WITH SALPINGO-OOPHORECTOMY Bilateral 07/21/2018   Procedure: TOTAL HYSTERECTOMY ABDOMINAL, BILATERAL SALPINGO-OOPHORECTOMY;  Surgeon: Anastasio Auerbach, MD;  Location: [facility];  Service: Gynecology;  Laterality: Bilateral;   TUBAL LIGATION       OB History    Gravida  7   Para  6   Term  4   Preterm  2   AB  1   Living  6     SAB      TAB      Ectopic  1   Multiple      Live Births               Home Medications    Prior to Admission medications   Medication Sig Start  Date End Date Taking? Authorizing Provider  amLODipine (NORVASC) 10 MG tablet Take 1 tablet by mouth once daily for 30 days Patient taking differently: Take 10 mg by mouth daily.  08/06/18  Yes Bonnita Hollow, MD  ferrous sulfate 325 (65 FE) MG tablet Take 325 mg by mouth daily with breakfast.    Yes [provider]  chlorthalidone (HYGROTON) 50 MG tablet TAKE 1 TABLET BY MOUTH ONCE DAILY Patient not taking: Reported on 04/05/2019 07/23/18   Bonnita Hollow, MD    Family History Family History  Problem Relation Age of Onset   Hypertension Mother     Social History Social History   Tobacco Use   Smoking status: Former Smoker     Packs/day: 0.50    Years: 28.00    Pack years: 14.00    Types: Cigarettes    Quit date: 09/20/2010    Years since quitting: 8.5   Smokeless tobacco: Never Used  Substance Use Topics   Alcohol use: Yes    Alcohol/week: 2.0 standard drinks    Types: 2 Cans of beer per week   Drug use: Yes    Types: Marijuana    Comment: on blood work 07/01/2018- positive for marjuana     Allergies   Latex, Amoxicillin, Naproxen, and Tramadol   Review of Systems Review of Systems  Constitutional: Negative for chills and fever.  HENT: Negative for congestion, ear discharge, ear pain, sinus pressure, sinus pain and sore throat.   Eyes: Negative for pain and redness.  Respiratory: Negative for cough and shortness of breath.   Cardiovascular: Negative for chest pain.  Gastrointestinal: Positive for abdominal pain, nausea and vomiting. Negative for constipation and diarrhea.  Genitourinary: Negative for dysuria and hematuria.  Musculoskeletal: Positive for myalgias. Negative for arthralgias, back pain, neck pain and neck stiffness.  Skin: Negative for wound.  Neurological: Positive for headaches. Negative for dizziness, weakness and numbness.     Physical Exam Updated Vital Signs BP (!) 187/95    Pulse 79    Temp 98.5 F (36.9 C) (Oral)    Resp 18    LMP 06/24/2018    SpO2 97%   Physical Exam Vitals signs and nursing note reviewed.  Constitutional:      General: She is not in acute distress.    Appearance: She is not ill-appearing.  HENT:     Head: Normocephalic and atraumatic.     Comments: No sinus or temporal tenderness.    Right Ear: Tympanic membrane and external ear normal.     Left Ear: Tympanic membrane and external ear normal.     Nose: Nose normal.     Mouth/Throat:     Mouth: Mucous membranes are dry.     Pharynx: Oropharynx is clear.     Comments: No erythema to oropharynx, no edema, no exudate, no tonsillar swelling, voice normal, neck supple without  lymphadenopathy  Eyes:     General: No scleral icterus.       Right eye: No discharge.        Left eye: No discharge.     Extraocular Movements: Extraocular movements intact.     Conjunctiva/sclera: Conjunctivae normal.     Pupils: Pupils are equal, round, and reactive to light.  Neck:     Musculoskeletal: Normal range of motion.     Vascular: No JVD.     Comments: No meningeal signs.  No nuchal rigidity Cardiovascular:     Rate and Rhythm: Normal rate and regular  rhythm.     Pulses: Normal pulses.          Radial pulses are 2+ on the right side and 2+ on the left side.     Heart sounds: Normal heart sounds.  Pulmonary:     Comments: Lungs clear to auscultation in all fields. Symmetric chest rise. No wheezing, rales, or rhonchi. Abdominal:     Comments: Abdomen is soft, non-distended.  Mild generalized abdominal tenderness. No rigidity, no guarding. No peritoneal signs.  Musculoskeletal: Normal range of motion.  Skin:    General: Skin is warm and dry.     Capillary Refill: Capillary refill takes less than 2 seconds.  Neurological:     Mental Status: She is oriented to person, place, and time.     GCS: GCS eye subscore is 4. GCS verbal subscore is 5. GCS motor subscore is 6.     Comments: Fluent speech, no facial droop. Speech is clear and goal oriented, follows commands CN III-XII intact, no facial droop Normal strength in upper and lower extremities bilaterally including dorsiflexion and plantar flexion, strong and equal grip strength Sensation normal to light and sharp touch Moves extremities without ataxia, coordination intact Normal finger to nose and rapid alternating movements Normal gait and balance  Psychiatric:        Behavior: Behavior normal.      ED Treatments / Results  Labs (all labs ordered are listed, but only abnormal results are displayed) Labs Reviewed  COMPREHENSIVE METABOLIC PANEL - Abnormal; Notable for the following components:      Result Value    Total Protein 8.2 (*)    All other components within normal limits  CBC - Abnormal; Notable for the following components:   MCH 25.8 (*)    Platelets 421 (*)    All other components within normal limits  NOVEL CORONAVIRUS, NAA (HOSP ORDER, SEND-OUT TO REF LAB; TAT 18-24 HRS)  LIPASE, BLOOD  URINALYSIS, ROUTINE W REFLEX MICROSCOPIC  INFLUENZA PANEL BY PCR (TYPE A & B)  I-STAT BETA HCG BLOOD, ED ([facility], [facility], [facility] ONLY)  POC SARS CORONAVIRUS 2 AG -  ED    EKG None  Radiology No results found.  Procedures Procedures (including critical care time)  Medications Ordered in ED Medications  sodium chloride flush (NS) 0.9 % injection 3 mL (3 mLs Intravenous Given 04/05/19 1233)  diphenhydrAMINE (BENADRYL) injection 25 mg (25 mg Intravenous Given 04/05/19 1236)  metoCLOPramide (REGLAN) injection 10 mg (10 mg Intravenous Given 04/05/19 1236)  acetaminophen (TYLENOL) tablet 650 mg (650 mg Oral Given 04/05/19 1406)  sodium chloride 0.9 % bolus 1,000 mL (0 mLs Intravenous Stopped 04/05/19 1454)     Initial Impression / Assessment and Plan / ED Course  I have reviewed the triage vital signs and the nursing notes.  Pertinent labs & imaging results that were available during my care of the patient were reviewed by me and considered in my medical decision making (see chart for details).  Patient seen and examined.  She is afebrile, no hypoxia, no tachycardia.  Blood pressure slightly elevated in triage 194/114.  She reports she took hypertension medicine this morning. Will closely monitor. On exam her mucous membranes are dry.  She has generalized abdominal tenderness without peritoneal signs.  Lungs are clear to auscultation all fields.  Neuro exam is normal.  No nuchal rigidity or change in vision.  Labs ordered in triage. Results show no leukocytosis, no severe electrolyte derangement, no renal insufficiency, lipase within normal range.  Rapid Covid test is negative. Flu test is negative. UA  without signs of infection. Pt HA treated with Benadryl and Reglan, Headache improved while in ED.  Presentation is like pts typical HA and non concerning for [facility], ICH, Meningitis, or temporal arteritis. Blood pressure improved with pain control. Pt is tolerating PO intake while in ED. Serial abdominal exams benign. Will perform covid PCR test as she has had many close exposures and is symptomatic with rapid test negative. Pt aware she will need to self quarantine until she has the result. Stable to discharge home with symptomatic care.  The patient appears reasonably screened and/or stabilized for discharge and I doubt any other medical condition or other [facility] requiring further screening, evaluation, or treatment in the ED at this time prior to discharge. The patient is safe for discharge with strict return precautions discussed. Recommend pcp follow up.  Susan Sanders was evaluated in Emergency Department on 04/05/2019 for the symptoms described in the history of present illness. She was evaluated in the context of the global COVID-19 pandemic, which necessitated consideration that the patient might be at risk for infection with the SARS-CoV-2 virus that causes COVID-19. Institutional protocols and algorithms that pertain to the evaluation of patients at risk for COVID-19 are in a state of rapid change based on information released by regulatory bodies including the CDC and federal and state organizations. These policies and algorithms were followed during the patient's care in the ED.  Portions of this note were generated with [employer]. Dictation errors may occur despite best attempts at proofreading.   Final Clinical Impressions(s) / ED Diagnoses   Final diagnoses:  Exposure to COVID-19 virus    ED Discharge Orders    None       Flint Melter 04/05/19 Shawnie Dapper, MD 04/13/19 [phone]

## 2019-04-05 NOTE — Discharge Instructions (Addendum)
Thank you for allowing [place] to care for you today.   Please return to the emergency department if you have any new or worsening symptoms.  You tested negative covid today with our rapid test. This could be a false positive and a PCR covid test was performed and is pending. It will result in 2-3 days. You should not work until you have the result from PCR test. You will be called if it is positive, if it is negative it will be available in your MyChart.  Medications- You can take medications to help treat your symptoms: -Tylenol for fever and body aches. Please take as prescribed on the bottle. -Over the coutner cough medicine such as mucinex, robitussin, or other brands.  Treatment- This is a virus and unfortunately there are no antibitotics approved to treat this virus at this time. It is important to monitor your symptoms closely: -You should have a theremometer at home to check your temperature when feeling feverish. -Use a pulse ox meter to measure your oxygen when feeling short of breath.  -If your fever is over 100.4 despite taking tylenol or if your oxygen level drops below 94% these are reasons to rturn to the emergency department for further evaluation. Please call the emergency department before you come to make [place] aware.    We recommend you self-isolate for 10 days and to inform your work/family/friends that you has the virus.  They will need to self-quarantine for 14 days to monitor for symptoms.    Again: symptoms of shortnessf breath, chest pain, difficulty breathing, new onset of confuison, any symptoms that are concerning. And you or the person should come to emergency department for evaluation.   I hope you feel better soon

## 2019-04-05 NOTE — ED Notes (Signed)
Pt verbalizes understanding of DC instructions. Pt belongings returned and is ambulatory out of ED.  

## 2019-04-05 NOTE — ED Triage Notes (Signed)
Pt had several episodes of emesis today at work. Pt describes having chills, as well as pressure in her head. Pt states that her mouth also is on fire.

## 2019-04-05 NOTE — ED Notes (Signed)
Pt verbalized "COVID exposures at work last week but was last tested Thursday 11/18 negative. Symptoms Started Friday 11/19"

## 2019-04-07 ENCOUNTER — Telehealth: Payer: Self-pay | Admitting: *Deleted

## 2019-04-07 LAB — NOVEL CORONAVIRUS, NAA (HOSP ORDER, SEND-OUT TO REF LAB; TAT 18-24 HRS): SARS-CoV-2, NAA: NOT DETECTED

## 2019-04-07 NOTE — Telephone Encounter (Signed)
Patient called and was given negative covid results . 

## 2019-08-25 ENCOUNTER — Ambulatory Visit: Payer: Medicaid Other | Admitting: Family Medicine

## 2019-08-25 ENCOUNTER — Other Ambulatory Visit: Payer: Self-pay

## 2019-08-25 DIAGNOSIS — J302 Other seasonal allergic rhinitis: Secondary | ICD-10-CM | POA: Insufficient documentation

## 2019-08-25 MED ORDER — CETIRIZINE HCL 10 MG PO TABS: 10.0000 mg | ORAL_TABLET | Freq: Every day | ORAL | 0 refills | Status: DC

## 2019-08-25 NOTE — Progress Notes (Signed)
   CHIEF COMPLAINT / HPI: 49 year old female who presents for bilateral eye puffiness, redness, itching.  She states this is been going on for 10 to 14 days.  She has had intermittent crusting when she wakes up as well.  She does not endorse any vision difficulties, but no states that she does not want to drive because her eyes are so itchy.  She has had moderate rhinorrhea and headaches recently.  PERTINENT  PMH / PSH:    OBJECTIVE: BP 130/78   Pulse 88   Ht 5\' 5"  (1.651 m)   Wt 200 lb (90.7 kg)   LMP 06/24/2018   SpO2 100%   BMI 33.28 kg/m   Gen: Pleasant 49 year old female, no acute distress HEENT: Mild conjunctival injection bilaterally, symmetric corneal reflection laterally. Vision: Bilateral 20/40, right 20/30, left 20/30 CV: Skin warm and dry Resp: No accessory muscle use, no shortness of breath Neuro: Alert and oriented, Speech clear, No gross deficits   ASSESSMENT / PLAN:  Seasonal allergies Patient symptoms consistent with seasonal allergies.  Last 10 to 14 days have been particularly strong in the area for pollen and other allergens.  We will try Zyrtec 10 mg daily.  Given the severity of her eye manifestations will also add on Pataday drops 2 times per day.     Susan Dawn MD PGY-3 Family Medicine Resident [place]

## 2019-08-25 NOTE — Patient Instructions (Signed)
It was great seeing you today!  Your eye puffiness, redness, and increased your production is due to allergies.  Especially with your runny nose.  I sent in Zyrtec with this problem as well as gave you a handout for Pataday drops which are eyedrop form antihistamines which helps a lot with your symptoms.  Please let me know if these do not work or if your symptoms change.

## 2019-08-25 NOTE — Assessment & Plan Note (Signed)
Patient symptoms consistent with seasonal allergies.  Last 10 to 14 days have been particularly strong in the area for pollen and other allergens.  We will try Zyrtec 10 mg daily.  Given the severity of her eye manifestations will also add on Pataday drops 2 times per day.

## 2019-09-21 ENCOUNTER — Other Ambulatory Visit: Payer: Self-pay

## 2019-09-21 ENCOUNTER — Ambulatory Visit (INDEPENDENT_AMBULATORY_CARE_PROVIDER_SITE_OTHER): Payer: Self-pay | Admitting: Family Medicine

## 2019-09-21 ENCOUNTER — Ambulatory Visit: Payer: Medicaid Other

## 2019-09-21 ENCOUNTER — Ambulatory Visit (INDEPENDENT_AMBULATORY_CARE_PROVIDER_SITE_OTHER): Payer: Self-pay

## 2019-09-21 DIAGNOSIS — S134XXA Sprain of ligaments of cervical spine, initial encounter: Secondary | ICD-10-CM

## 2019-09-21 DIAGNOSIS — Z111 Encounter for screening for respiratory tuberculosis: Secondary | ICD-10-CM

## 2019-09-21 MED ORDER — BACLOFEN 5 MG PO TABS: 5.0000 mg | ORAL_TABLET | Freq: Every evening | ORAL | 0 refills | Status: DC | PRN

## 2019-09-21 MED ORDER — NAPROXEN 500 MG PO TABS: 500.0000 mg | ORAL_TABLET | Freq: Two times a day (BID) | ORAL | 0 refills | Status: AC

## 2019-09-21 NOTE — Progress Notes (Signed)
Patient is here for PPD placement.  It was placed on 09/21/2019 in the left forearm @ 2:15 pm.  Site unremarkable. Wheel noted. Patient to return to clinic on Wednesday for PPD read.      Talbot Grumbling, RN
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
## 2019-09-21 NOTE — Patient Instructions (Signed)
It was great to see you!  Our plans for today:  - Take the naproxen scheduled for the next 7 days. - Take the muscle relaxer at night and as needed. This can make you sleepy. - Continue using the heating pad. - If you develop vision changes, worsened headache, or inability to move your arm, come back to see [place].  Take care and seek immediate care sooner if you develop any concerns.   Dr. [facility]

## 2019-09-23 ENCOUNTER — Ambulatory Visit (INDEPENDENT_AMBULATORY_CARE_PROVIDER_SITE_OTHER): Payer: Self-pay

## 2019-09-23 ENCOUNTER — Ambulatory Visit: Payer: Medicaid Other

## 2019-09-23 ENCOUNTER — Other Ambulatory Visit: Payer: Self-pay

## 2019-09-23 DIAGNOSIS — S134XXA Sprain of ligaments of cervical spine, initial encounter: Secondary | ICD-10-CM | POA: Insufficient documentation

## 2019-09-23 DIAGNOSIS — Z111 Encounter for screening for respiratory tuberculosis: Secondary | ICD-10-CM

## 2019-09-23 LAB — TB SKIN TEST
Induration: 0 mm
TB Skin Test: NEGATIVE
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
## 2019-09-23 NOTE — Progress Notes (Signed)
Patient is here for a PPD read.  It was placed on 09/21/2019 in the left forearm @ 2:15 pm.    PPD RESULTS:  Result: negative Induration: 0 mm  Letter created and given to patient for documentation purposes. Talbot Grumbling, RN

## 2019-11-10 IMAGING — US US PELVIS COMPLETE TRANSABD/TRANSVAG W DUPLEX
1 series · 13 of 25 positions shown · non-contrast
Comparison: CT abdomen and pelvis 06/24/2018, ultrasound pelvis
06/01/2018

CLINICAL DATA: RIGHT lower quadrant abdominal pain, history of
tubal ligation and uterine fibroids

EXAM:
TRANSABDOMINAL AND TRANSVAGINAL ULTRASOUND OF PELVIS
DOPPLER ULTRASOUND OF OVARIES
TECHNIQUE: Both transabdominal and transvaginal ultrasound examinations of the
pelvis were performed. Transabdominal technique was performed for
global imaging of the pelvis including uterus, ovaries, adnexal
regions, and pelvic cul-de-sac.
It was necessary to proceed with endovaginal exam following the
transabdominal exam to visualize the endometrium and LEFT ovary.
Color and duplex Doppler ultrasound was utilized to evaluate blood
flow to the ovaries.

[Series 1: us pelvis complete transabd/transvag w duplex · 0.26mm/px · 13 of 74 slices shown]
[im 1/74]
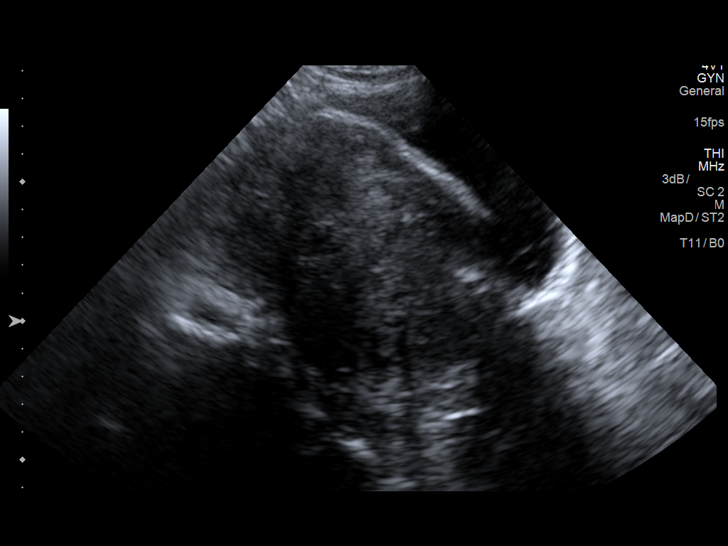
[im 7/74]
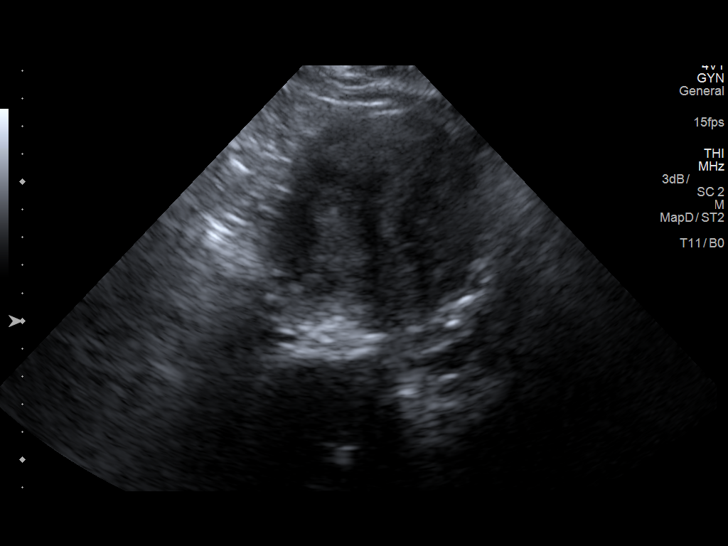
[im 13/74]
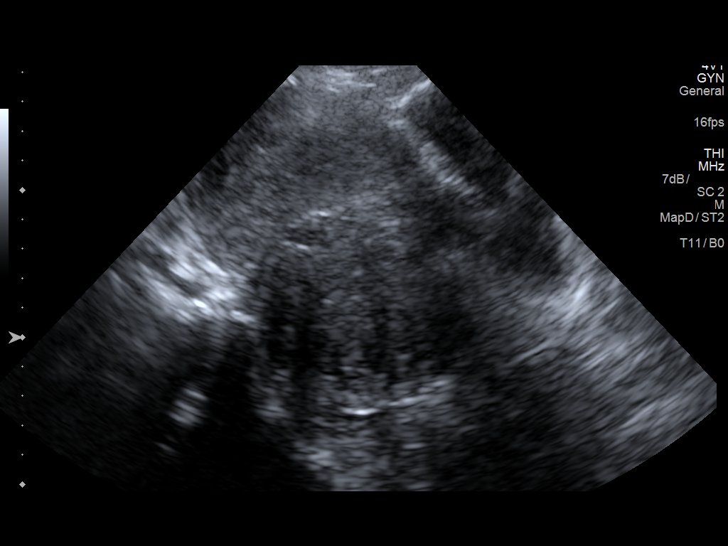
[im 19/74]
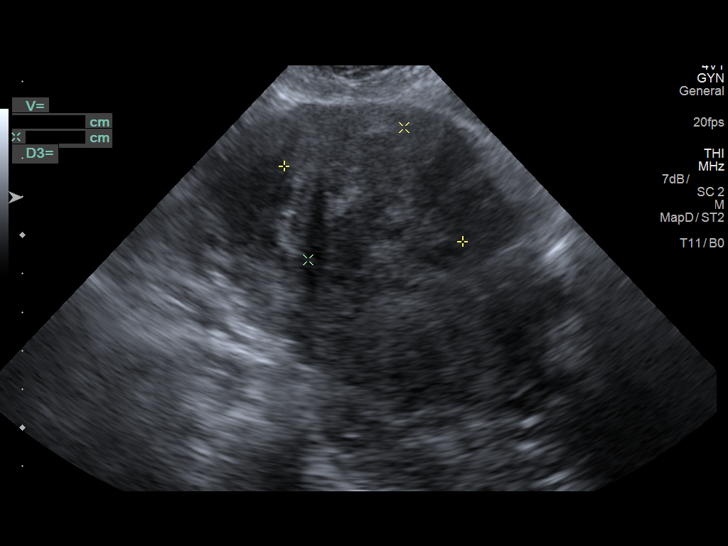
[im 25/74]
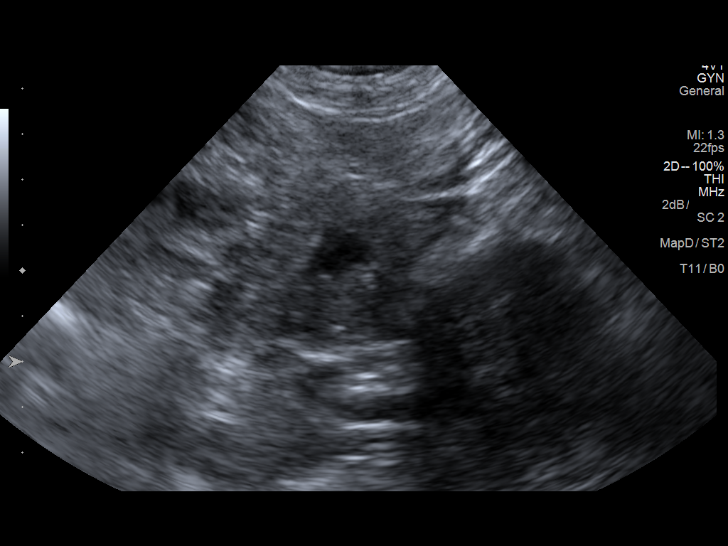
[im 31/74]
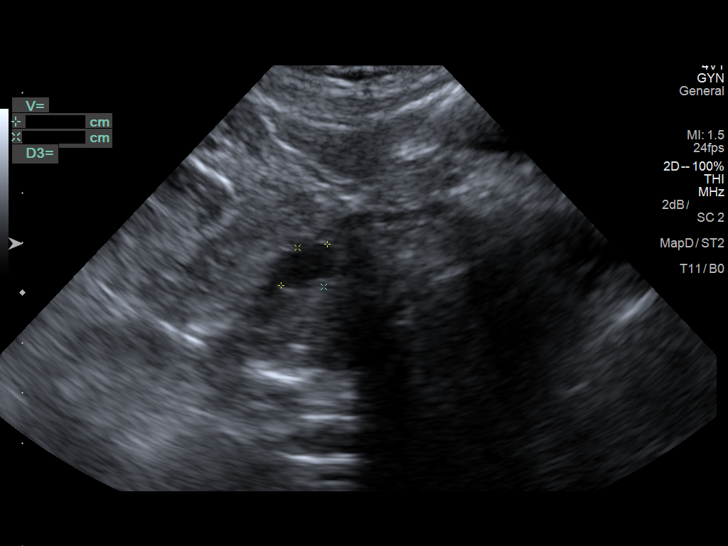
[im 37/74]
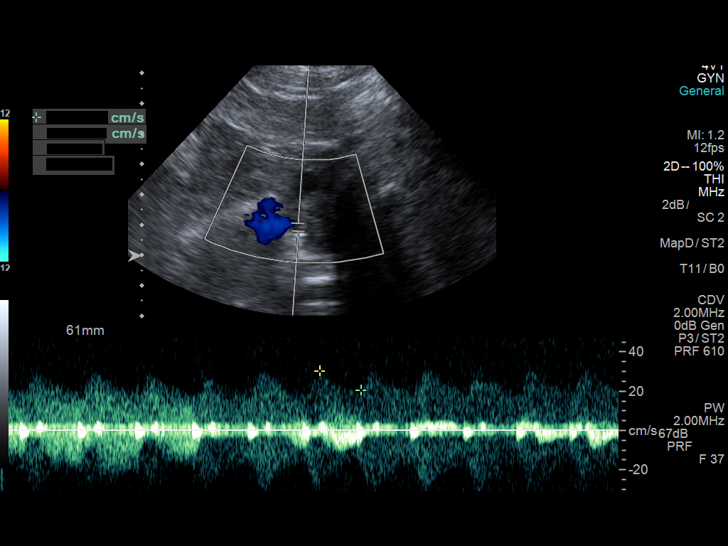
[im 43/74]
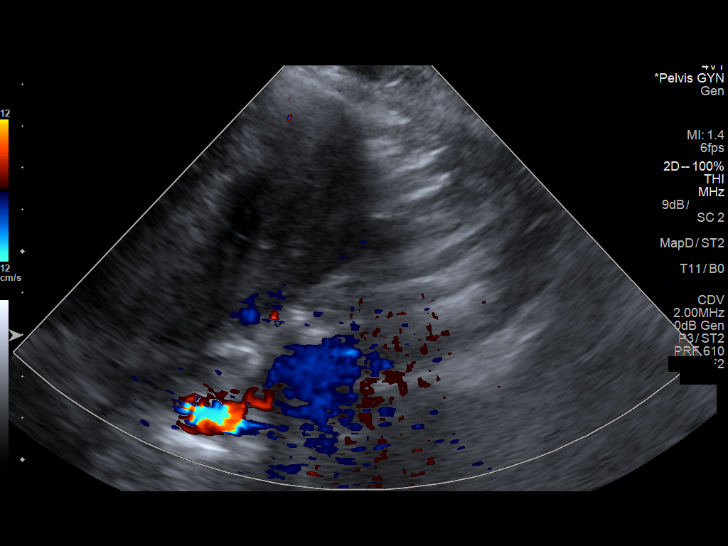
[im 49/74]
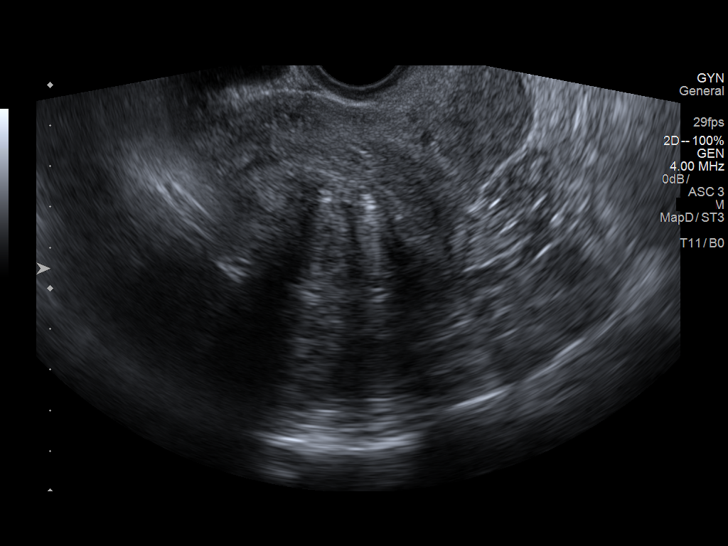
[im 55/74]
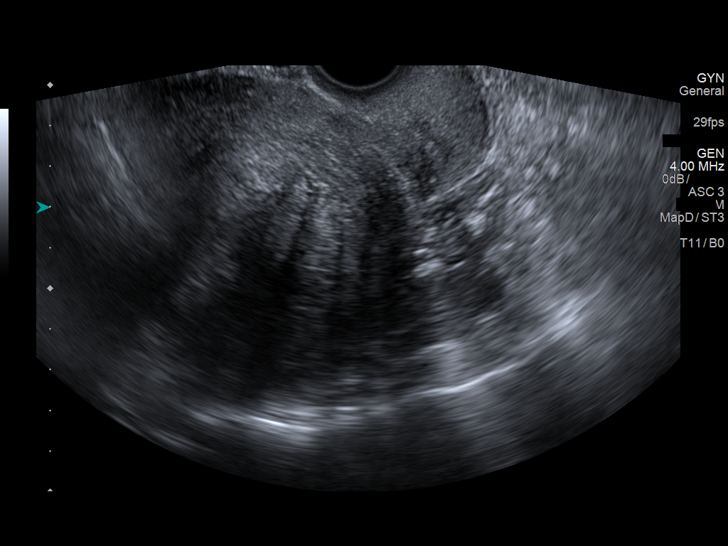
[im 61/74]
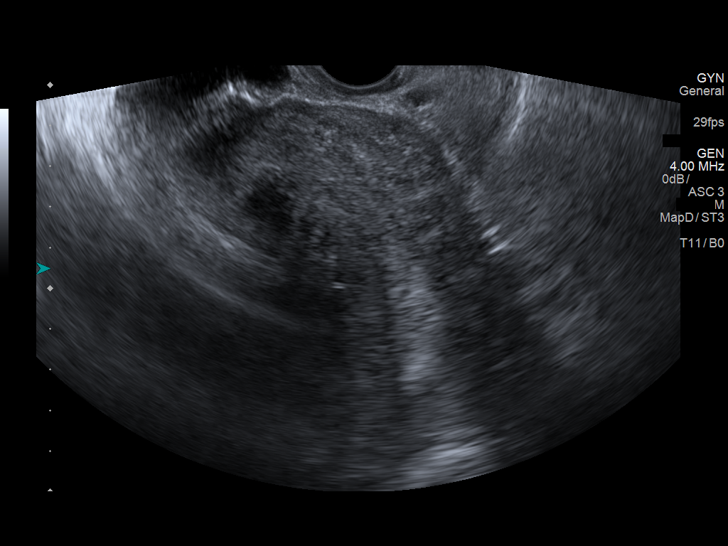
[im 67/74]
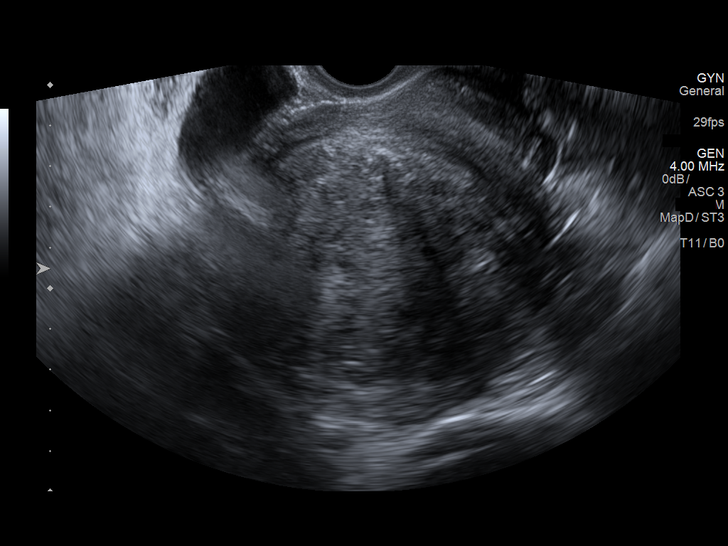
[im 74/74]
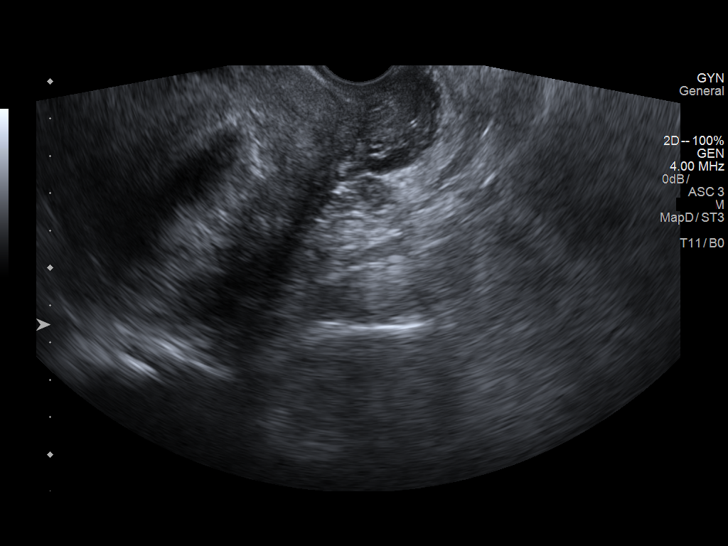

[13 of 25 positions shown; findings below may reference images not displayed]

FINDINGS: Uterus

Measurements: 14.3 x 7.7 x 9.1 cm = volume: 524 mL. Large posterior
mass at upper uterine segment consistent with leiomyoma, slightly
greater to RIGHT and extending submucosal, 7.7 x 6.7 x 7.4 cm.
Additional anterior fundal intramural leiomyoma on LEFT 5.0 x 4.2 x
4.7 cm.

Endometrium

Thickness: 11 mm.  No endometrial fluid or focal abnormality

Right ovary

Measurements: 3.8 x 2.4 x 2.2 cm = volume: 10.5 mL. Normal
morphology without mass. Internal blood flow present on color
Doppler imaging.

Left ovary

Not visualized on either transabdominal or endovaginal imaging,
likely obscured by bowel

Pulsed Doppler evaluation of the RIGHT ovary demonstrates normal
low-resistance arterial and venous waveforms.

Other findings

No free pelvic fluid.  No adnexal masses.
IMPRESSION: Enlarged uterus containing 2 large upper uterine leiomyomata, the
larger posterior of which extends submucosal.

Normal appearing RIGHT ovary with nonvisualization of LEFT ovary as
above.
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
## 2019-11-13 ENCOUNTER — Ambulatory Visit (INDEPENDENT_AMBULATORY_CARE_PROVIDER_SITE_OTHER): Payer: Self-pay | Admitting: Family Medicine

## 2019-11-13 ENCOUNTER — Other Ambulatory Visit: Payer: Self-pay

## 2019-11-13 ENCOUNTER — Encounter: Payer: Self-pay | Admitting: Family Medicine

## 2019-11-13 VITALS — BP 170/90 | HR 79 | Ht 65.0 in | Wt 205.2 lb

## 2019-11-13 DIAGNOSIS — S134XXA Sprain of ligaments of cervical spine, initial encounter: Secondary | ICD-10-CM

## 2019-11-13 DIAGNOSIS — I10 Essential (primary) hypertension: Secondary | ICD-10-CM

## 2019-11-13 DIAGNOSIS — Z0000 Encounter for general adult medical examination without abnormal findings: Secondary | ICD-10-CM

## 2019-11-13 MED ORDER — AMLODIPINE BESYLATE 10 MG PO TABS: 10.0000 mg | ORAL_TABLET | Freq: Every day | ORAL | 3 refills | Status: DC

## 2019-11-13 MED ORDER — CYCLOBENZAPRINE HCL 10 MG PO TABS: 5.0000 mg | ORAL_TABLET | Freq: Every day | ORAL | 0 refills | Status: AC

## 2019-11-13 MED ORDER — IBUPROFEN 600 MG PO TABS: 600.0000 mg | ORAL_TABLET | Freq: Four times a day (QID) | ORAL | 0 refills | Status: DC | PRN

## 2019-11-13 NOTE — Patient Instructions (Addendum)
It was nice meeting you today!  I have sent referral to physiotherapy  I will call you with the results of your xray if it abnormal  Follow up in 2 weeks with PCP  If you have any questions or concerns, please feel free to call the clinic.   Be well,  Carollee Leitz, MD [facility]     Muscle Strain A muscle strain is an injury that happens when a muscle is stretched longer than normal. This can happen during a fall, sports, or lifting. This can tear some muscle fibers. Usually, recovery from muscle strain takes 1-2 weeks. Complete healing normally takes 5-6 weeks. This condition is first treated with PRICE therapy. This involves:  Protecting your muscle from being injured again.  Resting your injured muscle.  Icing your injured muscle.  Applying pressure (compression) to your injured muscle. This may be done with a splint or elastic bandage.  Raising (elevating) your injured muscle. Your doctor may also recommend medicine for pain. Follow these instructions at home: If you have a splint:  Wear the splint as told by your doctor. Take it off only as told by your doctor.  Loosen the splint if your fingers or toes tingle, get numb, or turn cold and blue.  Keep the splint clean.  If the splint is not waterproof: ? Do not let it get wet. ? Cover it with a watertight covering when you take a bath or a shower. Managing pain, stiffness, and swelling   If directed, put ice on your injured area. ? If you have a removable splint, take it off as told by your doctor. ? Put ice in a plastic bag. ? Place a towel between your skin and the bag. ? Leave the ice on for 20 minutes, 2-3 times a day.  Move your fingers or toes often. This helps to avoid stiffness and lessen swelling.  Raise your injured area above the level of your heart while you are sitting or lying down.  Wear an elastic bandage as told by your doctor. Make sure it is not too tight. General  instructions  Take over-the-counter and prescription medicines only as told by your doctor.  Limit your activity. Rest your injured muscle as told by your doctor. Your doctor may say that gentle movements are okay.  If physical therapy was prescribed, do exercises as told by your doctor.  Do not put pressure on any part of the splint until it is fully hardened. This may take many hours.  Do not use any products that contain nicotine or tobacco, such as cigarettes and e-cigarettes. These can delay bone healing. If you need help quitting, ask your doctor.  Warm up before you exercise. This helps to prevent more muscle strains.  Ask your doctor when it is safe to drive if you have a splint.  Keep all follow-up visits as told by your doctor. This is important. Contact a doctor if:  You have more pain or swelling in your injured area. Get help right away if:  You have any of these problems in your injured area: ? You have numbness. ? You have tingling. ? You lose a lot of strength. Summary  A muscle strain is an injury that happens when a muscle is stretched longer than normal.  This condition is first treated with PRICE therapy. This includes protecting, resting, icing, adding pressure, and raising your injury.  Limit your activity. Rest your injured muscle as told by your doctor. Your doctor  may say that gentle movements are okay.  Warm up before you exercise. This helps to prevent more muscle strains. This information is not intended to replace advice given to you by your health care provider. Make sure you discuss any questions you have with your health care provider. Document Revised: 06/26/2018 Document Reviewed: 06/06/2016 Elsevier Patient Education  [place].

## 2019-11-19 ENCOUNTER — Encounter: Payer: Self-pay | Admitting: Family Medicine

## 2019-11-19 NOTE — Assessment & Plan Note (Signed)
Refer to GI for colonoscopy Mammogram ordered

## 2019-11-19 NOTE — Assessment & Plan Note (Signed)
Reorder Norvasc 10 mg daily. Follow-up with PCP in 2 weeks for blood pressure check.  Consider restarting chlorthalidone if blood pressure continues to remain elevated.

## 2019-11-19 NOTE — Assessment & Plan Note (Addendum)
Continues to have upper back pain, neck and shoulder. We will obtain C-spine x-rays Cyclobenzaprine 5 mg nightly Ibuprofen 600 mg every 6 hours Tylenol as needed Refer to physiotherapy Letter provided to remain off work till Monday, July 5. Follow-up with PCP as needed

## 2019-12-02 ENCOUNTER — Ambulatory Visit: Payer: Medicaid Other | Attending: Family Medicine | Admitting: Physical Therapy

## 2020-01-04 ENCOUNTER — Other Ambulatory Visit: Payer: Self-pay

## 2020-01-04 ENCOUNTER — Ambulatory Visit (INDEPENDENT_AMBULATORY_CARE_PROVIDER_SITE_OTHER): Payer: Self-pay | Admitting: Student in an Organized Health Care Education/Training Program

## 2020-01-04 VITALS — BP 134/86 | HR 80 | Wt 205.0 lb

## 2020-01-04 DIAGNOSIS — M25511 Pain in right shoulder: Secondary | ICD-10-CM | POA: Insufficient documentation

## 2020-01-04 DIAGNOSIS — M545 Low back pain, unspecified: Secondary | ICD-10-CM

## 2020-01-04 DIAGNOSIS — M25512 Pain in left shoulder: Secondary | ICD-10-CM

## 2020-01-04 DIAGNOSIS — M791 Myalgia, unspecified site: Secondary | ICD-10-CM

## 2020-01-04 MED ORDER — BACLOFEN 20 MG PO TABS: 20.0000 mg | ORAL_TABLET | Freq: Two times a day (BID) | ORAL | 0 refills | Status: DC | PRN

## 2020-01-04 NOTE — Patient Instructions (Addendum)
It was a pleasure to see you today!  To summarize our discussion for this visit:  I'm sorry to hear that your back is hurting still. We are going to try to figure out what is going on by starting with some blood work.  In the meantime, we will try another muscle relaxer and some back exercises. We can discuss next steps of diagnosing the problem if the labs come back normal and you are not getting better with this treatment.  Some additional health maintenance measures we should update are: Health Maintenance Due  Topic Date Due  . COLONOSCOPY  Never done  . MAMMOGRAM  06/28/2019  . INFLUENZA VACCINE  12/13/2019  .   Please return to our clinic to see me.  Call the clinic at [phone] if your symptoms worsen or you have any concerns.   Thank you for allowing me to take part in your care,  Dr. Doristine Mango   Thoracic Strain Rehab Ask your health care provider which exercises are safe for you. Do exercises exactly as told by your health care provider and adjust them as directed. It is normal to feel mild stretching, pulling, tightness, or discomfort as you do these exercises. Stop right away if you feel sudden pain or your pain gets worse. Do not begin these exercises until told by your health care provider. Stretching and range-of-motion exercise This exercise warms up your muscles and joints and improves the movement and flexibility of your back and shoulders. This exercise also helps to relieve pain. Chest and spine stretch  1. Lie down on your back on a firm surface. 2. Roll a towel or a small blanket so it is about 4 inches (10 cm) in diameter. 3. Put the towel lengthwise under the middle of your back so it is under your spine, but not under your shoulder blades. 4. Put your hands behind your head and let your elbows fall to your sides. This will increase your stretch. 5. Take a deep breath (inhale). 6. Hold for __________ seconds. 7. Relax after you breathe out  (exhale). Repeat __________ times. Complete this exercise __________ times a day. Strengthening exercises These exercises build strength and endurance in your back and your shoulder blade muscles. Endurance is the ability to use your muscles for a long time, even after they get tired. Alternating arm and leg raises  1. Get on your hands and knees on a firm surface. If you are on a hard floor, you may want to use padding, such as an exercise mat, to cushion your knees. 2. Line up your arms and legs. Your hands should be directly below your shoulders, and your knees should be directly below your hips. 3. Lift your left leg behind you. At the same time, raise your right arm and straighten it in front of you. ? Do not lift your leg higher than your hip. ? Do not lift your arm higher than your shoulder. ? Keep your abdominal and back muscles tight. ? Keep your hips facing the ground. ? Do not arch your back. ? Keep your balance carefully, and do not hold your breath. 4. Hold for __________ seconds. 5. Slowly return to the starting position and repeat with your right leg and your left arm. Repeat __________ times. Complete this exercise __________ times a day. Straight arm rows This exercise is also called shoulder extension exercise. 1. Stand with your feet shoulder width apart. 2. Secure an exercise band to a stable object in front  of you so the band is at or above shoulder height. 3. Hold one end of the exercise band in each hand. 4. Straighten your elbows and lift your hands up to shoulder height. 5. Step back, away from the secured end of the exercise band, until the band stretches. 6. Squeeze your shoulder blades together and pull your hands down to the sides of your thighs. Stop when your hands are straight down by your sides. This is shoulder extension. Do not let your hands go behind your body. 7. Hold for __________ seconds. 8. Slowly return to the starting position. Repeat __________  times. Complete this exercise __________ times a day. Prone shoulder external rotation 1. Lie on your abdomen on a firm bed so your left / right forearm hangs over the edge of the bed and your upper arm is on the bed, straight out from your body. This is the prone position. ? Your elbow should be bent. ? Your palm should be facing your feet. 2. If instructed, hold a __________ weight in your hand. 3. Squeeze your shoulder blade toward the middle of your back. Do not let your shoulder lift toward your ear. 4. Keep your elbow bent in a 90-degree angle (right angle) while you slowly move your forearm up toward the ceiling. Move your forearm up to the height of the bed, toward your head. This is external rotation. ? Your upper arm should not move. ? At the top of the movement, your palm should face the floor. 5. Hold for __________ seconds. 6. Slowly return to the starting position and relax your muscles. Repeat __________ times. Complete this exercise __________ times a day. Rowing scapular retraction This is an exercise in which the shoulder blades (scapulae) are pulled toward each other (retraction). 1. Sit in a stable chair without armrests, or stand up. 2. Secure an exercise band to a stable object in front of you so the band is at shoulder height. 3. Hold one end of the exercise band in each hand. Your palms should face down. 4. Bring your arms out straight in front of you. 5. Step back, away from the secured end of the exercise band, until the band stretches. 6. Pull the band backward. As you do this, bend your elbows and squeeze your shoulder blades together, but avoid letting the rest of your body move. Do not shrug your shoulders upward while you do this. 7. Stop when your elbows are at your sides or slightly behind your body. 8. Hold for __________ seconds. 9. Slowly straighten your arms to return to the starting position. Repeat __________ times. Complete this exercise __________  times a day. Posture and body mechanics Good posture and healthy body mechanics can help to relieve stress in your body's tissues and joints. Body mechanics refers to the movements and positions of your body while you do your daily activities. Posture is part of body mechanics. Good posture means:  Your spine is in its natural S-curve position (neutral).  Your shoulders are pulled back slightly.  Your head is not tipped forward. Follow these guidelines to improve your posture and body mechanics in your everyday activities. Standing   When standing, keep your spine neutral and your feet about hip width apart. Keep a slight bend in your knees. Your ears, shoulders, and hips should line up with each other.  When you do a task in which you lean forward while standing in one place for a long time, place one foot up on a  stable object that is 2-4 inches (5-10 cm) high, such as a footstool. This helps keep your spine neutral. Sitting   When sitting, keep your spine neutral and keep your feet flat on the floor. Use a footrest, if necessary, and keep your thighs parallel to the floor. Avoid rounding your shoulders, and avoid tilting your head forward.  When working at a desk or a computer, keep your desk at a height where your hands are slightly lower than your elbows. Slide your chair under your desk so you are close enough to maintain good posture.  When working at a computer, place your monitor at a height where you are looking straight ahead and you do not have to tilt your head forward or downward to look at the screen. Resting When lying down and resting, avoid positions that are most painful for you.  If you have pain with activities such as sitting, bending, stooping, or squatting (flexion-basedactivities), lie in a position in which your body does not bend very much. For example, avoid curling up on your side with your arms and knees near your chest (fetal position).  If you have pain  with activities such as standing for a long time or reaching with your arms (extension-basedactivities), lie with your spine in a neutral position and bend your knees slightly. Try the following positions: ? Lie on your side with a pillow between your knees. ? Lie on your back with a pillow under your knees.  Lifting   When lifting objects, keep your feet at least shoulder width apart and tighten your abdominal muscles.  Bend your knees and hips and keep your spine neutral. It is important to lift using the strength of your legs, not your back. Do not lock your knees straight out.  Always ask for help to lift heavy or awkward objects. This information is not intended to replace advice given to you by your health care provider. Make sure you discuss any questions you have with your health care provider. Document Revised: 08/22/2018 Document Reviewed: 06/09/2018 Elsevier Patient Education  [place].
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
## 2020-01-04 NOTE — Assessment & Plan Note (Signed)
Unchanged since May. Unable to complete physical therapy due to financial constraints - provided with home exercises handout - baclofen - ordering labs to help rule out autoimmune contribution. ANA, CK, TSH, CRP, ESR, CBC - will establish  return guidance/further workup plan with results of labs

## 2020-01-05 LAB — COMPREHENSIVE METABOLIC PANEL
ALT: 25 IU/L (ref 0–32)
AST: 38 IU/L (ref 0–40)
Albumin/Globulin Ratio: 1.4 (ref 1.2–2.2)
Albumin: 4.2 g/dL (ref 3.8–4.9)
Alkaline Phosphatase: 102 IU/L (ref 48–121)
BUN/Creatinine Ratio: 13 (ref 9–23)
BUN: 11 mg/dL (ref 6–24)
Bilirubin Total: 0.4 mg/dL (ref 0.0–1.2)
CO2: 22 mmol/L (ref 20–29)
Calcium: 9.7 mg/dL (ref 8.7–10.2)
Chloride: 104 mmol/L (ref 96–106)
Creatinine, Ser: 0.82 mg/dL (ref 0.57–1.00)
GFR calc Af Amer: 96 mL/min/{1.73_m2} (ref >59)
GFR calc non Af Amer: 83 mL/min/{1.73_m2} (ref >59)
Globulin, Total: 3 g/dL (ref 1.5–4.5)
Glucose: 93 mg/dL (ref 65–99)
Potassium: 4.4 mmol/L (ref 3.5–5.2)
Sodium: 140 mmol/L (ref 134–144)
Total Protein: 7.2 g/dL (ref 6.0–8.5)

## 2020-01-05 LAB — CBC
Hematocrit: 37.2 % (ref 34.0–46.6)
Hemoglobin: 12.1 g/dL (ref 11.1–15.9)
MCH: 25.9 pg — ABNORMAL LOW (ref 26.6–33.0)
MCHC: 32.5 g/dL (ref 31.5–35.7)
MCV: 80 fL (ref 79–97)
Platelets: 391 10*3/uL (ref 150–450)
RBC: 4.68 x10E6/uL (ref 3.77–5.28)
RDW: 14.3 % (ref 11.7–15.4)
WBC: 6.6 10*3/uL (ref 3.4–10.8)

## 2020-01-05 LAB — SEDIMENTATION RATE: Sed Rate: 25 mm/hr (ref 0–40)

## 2020-01-05 LAB — TSH: TSH: 0.986 u[IU]/mL (ref 0.450–4.500)

## 2020-01-05 LAB — C-REACTIVE PROTEIN: CRP: 13 mg/L — ABNORMAL HIGH (ref 0–10)

## 2020-01-07 LAB — ANA: Anti Nuclear Antibody (ANA): NEGATIVE

## 2020-01-07 LAB — SPECIMEN STATUS REPORT

## 2020-02-14 ENCOUNTER — Ambulatory Visit (HOSPITAL_COMMUNITY)
Admission: EM | Admit: 2020-02-14 | Discharge: 2020-02-14 | Disposition: A | Discharge status: Home / Self Care | Payer: HRSA Program | Attending: Family Medicine | Admitting: Family Medicine

## 2020-02-14 ENCOUNTER — Encounter (HOSPITAL_COMMUNITY): Payer: Self-pay | Admitting: *Deleted

## 2020-02-14 ENCOUNTER — Other Ambulatory Visit: Payer: Self-pay

## 2020-02-14 DIAGNOSIS — R5383 Other fatigue: Secondary | ICD-10-CM | POA: Insufficient documentation

## 2020-02-14 DIAGNOSIS — B349 Viral infection, unspecified: Secondary | ICD-10-CM | POA: Insufficient documentation

## 2020-02-14 DIAGNOSIS — R52 Pain, unspecified: Secondary | ICD-10-CM | POA: Diagnosis present

## 2020-02-14 DIAGNOSIS — R519 Headache, unspecified: Secondary | ICD-10-CM | POA: Insufficient documentation

## 2020-02-14 DIAGNOSIS — Z1152 Encounter for screening for COVID-19: Secondary | ICD-10-CM | POA: Diagnosis present

## 2020-02-14 DIAGNOSIS — R6883 Chills (without fever): Secondary | ICD-10-CM | POA: Diagnosis present

## 2020-02-14 NOTE — ED Provider Notes (Signed)
[place]   [id number] 02/14/20 Arrival Time: 1005   CC: COVID symptoms  SUBJECTIVE: History from: patient.  Puanani Gene is a 49 y.o. female who presents with abrupt onset of nasal congestion, PND, aches, headache, chills, nausea, diarrhea, fever and persistent dry cough for the last 3 days. Denies sick exposure to COVID, flu or strep. Denies recent travel. Has negative history of Covid. Has not completed Covid vaccines. Has not taken OTC medications for this. There are no aggravating or alleviating factors. Denies previous symptoms in the past. Denies  sinus pain, rhinorrhea, SOB, wheezing, chest pain, nausea, changes in bowel or bladder habits.    ROS: As per HPI.  All other pertinent ROS negative.     Past Medical History:  Diagnosis Date   Anemia    Anxiety    Cellulitis    Chest pain    Childhood asthma    Chronic back pain    "all over" (1/51/7616)   Complication of anesthesia    "difficult waking up " (03/30/2019)   Depression    Eczema    Hypertension    Scoliosis    Past Surgical History:  Procedure Laterality Date   ABDOMINAL HYSTERECTOMY     BREAST SURGERY     breast biopsy-right breast   HYSTERECTOMY ABDOMINAL WITH SALPINGO-OOPHORECTOMY Bilateral 07/21/2018   Procedure: TOTAL HYSTERECTOMY ABDOMINAL, BILATERAL SALPINGO-OOPHORECTOMY;  Surgeon: Anastasio Auerbach, MD;  Location: [facility];  Service: Gynecology;  Laterality: Bilateral;   TUBAL LIGATION     Allergies  Allergen Reactions   Latex Shortness Of Breath    Itching and burns skin   Amoxicillin Itching and Swelling    Has patient had a PCN reaction causing immediate rash, facial/tongue/throat swelling, SOB or lightheadedness with hypotension: Yes Has patient had a PCN reaction causing severe rash involving mucus membranes or skin necrosis: Yes Has patient had a PCN reaction that required hospitalization: Yes Has patient had a PCN reaction occurring  within the last 10 years: No If all of the above answers are "NO", then may proceed with Cephalosporin use.    Naproxen Hives and Itching    Takes IBU without difficulty   Tramadol Hives and Itching    Takes IBU without difficulty   No current facility-administered medications on file prior to encounter.   Current Outpatient Medications on File Prior to Encounter  Medication Sig Dispense Refill   amLODipine (NORVASC) 10 MG tablet Take 1 tablet (10 mg total) by mouth at bedtime. 90 tablet 3   ferrous sulfate 325 (65 FE) MG tablet Take 325 mg by mouth daily with breakfast.      MULTIPLE VITAMINS PO Take by mouth.     amLODipine (NORVASC) 10 MG tablet Take 1 tablet by mouth once daily for 30 days (Patient taking differently: Take 10 mg by mouth daily. ) 90 tablet 3   baclofen (LIORESAL) 20 MG tablet Take 1 tablet (20 mg total) by mouth 2 (two) times daily as needed for muscle spasms. 30 each 0   cetirizine (ZYRTEC) 10 MG tablet Take 1 tablet (10 mg total) by mouth daily. 30 tablet 0   ibuprofen (ADVIL) 600 MG tablet Take 1 tablet (600 mg total) by mouth every 6 (six) hours as needed. 30 tablet 0   Social History   Socioeconomic History   Marital status: Divorced    Spouse name: Not on file   Number of children: Not on file   Years of education: Not on file  Highest education level: Not on file  Occupational History   Not on file  Tobacco Use   Smoking status: Former Smoker    Packs/day: 0.50    Years: 28.00    Pack years: 14.00    Types: Cigarettes    Quit date: 09/20/2010    Years since quitting: 9.4   Smokeless tobacco: Never Used  Vaping Use   Vaping Use: Never used  Substance and Sexual Activity   Alcohol use: Yes    Alcohol/week: 2.0 standard drinks    Types: 2 Cans of beer per week   Drug use: Not Currently    Types: Marijuana   Sexual activity: Not Currently    Birth control/protection: Surgical    Comment: 1st intercourse 47 yo-5 partners    Other Topics Concern   Not on file  Social History Narrative   Not on file   Social Determinants of Health   Financial Resource Strain:    Difficulty of Paying Living Expenses: Not on file  Food Insecurity:    Worried About Charity fundraiser in the Last Year: Not on file   [employer] of Food in the Last Year: Not on file  Transportation Needs:    Lack of Transportation (Medical): Not on file   Lack of Transportation (Non-Medical): Not on file  Physical Activity:    Days of Exercise per Week: Not on file   Minutes of Exercise per Session: Not on file  Stress:    Feeling of Stress : Not on file  Social Connections:    Frequency of Communication with Friends and Family: Not on file   Frequency of Social Gatherings with Friends and Family: Not on file   Attends Religious Services: Not on file   Active Member of Clubs or Organizations: Not on file   Attends Archivist Meetings: Not on file   Marital Status: Not on file  Intimate Partner Violence:    Fear of Current or Ex-Partner: Not on file   Emotionally Abused: Not on file   Physically Abused: Not on file   Sexually Abused: Not on file   Family History  Problem Relation Age of Onset   Hypertension Mother     OBJECTIVE:  Vitals:   02/14/20 1030  BP: (!) 167/103  Pulse: 79  Resp: 16  Temp: 98.9 F (37.2 C)  TempSrc: Oral  SpO2: 100%     General appearance: alert; appears fatigued, but nontoxic; speaking in full sentences and tolerating own secretions HEENT: NCAT; Ears: EACs clear, TMs pearly gray; Eyes: PERRL.  EOM grossly intact. Sinuses: nontender; Nose: nares patent without rhinorrhea, Throat: oropharynx clear, tonsils non erythematous or enlarged, uvula midline  Neck: supple without LAD Lungs: unlabored respirations, symmetrical air entry; cough: absent; no respiratory distress; CTAB Heart: regular rate and rhythm.  Radial pulses 2+ symmetrical bilaterally Skin: warm and  dry Psychological: alert and cooperative; normal mood and affect  LABS:  No results found for this or any previous visit (from the past 24 hour(s)).   ASSESSMENT & PLAN:  1. Viral illness   2. Other fatigue   3. Nonintractable headache, unspecified chronicity pattern, unspecified headache type   4. Encounter for screening for COVID-19   5. Chills   6. Body aches    Continue supportive care at home   COVID testing ordered.  It will take between 1-2 days for test results.  Someone will contact you regarding abnormal results.    Patient should remain in quarantine  until they have received Covid results.  If negative you may resume normal activities (go back to work/school) while practicing hand hygiene, social distance, and mask wearing.  If positive, patient should remain in quarantine for 10 days from symptom onset AND greater than 72 hours after symptoms resolution (absence of fever without the use of fever-reducing medication and improvement in respiratory symptoms), whichever is longer Get plenty of rest and push fluids Use OTC zyrtec for nasal congestion, runny nose, and/or sore throat Use OTC flonase for nasal congestion and runny nose Use medications daily for symptom relief Use OTC medications like ibuprofen or tylenol as needed fever or pain Call or go to the ED if you have any new or worsening symptoms such as fever, worsening cough, shortness of breath, chest tightness, chest pain, turning blue, changes in mental status.  Reviewed expectations re: course of current medical issues. Questions answered. Outlined signs and symptoms indicating need for more acute intervention. Patient verbalized understanding. After Visit Summary given.         Faustino Congress, NP 02/14/20 1114

## 2020-02-14 NOTE — ED Triage Notes (Addendum)
C/O starting with body aches 3 days ago with nausea and diarrhea.  Reports fevers up to 100.2.  C/O "stiffness and aching" in chest and back, HA; but denies cough, runny nose or congestion.  Has been taking Tyl.

## 2020-02-14 NOTE — Discharge Instructions (Signed)
Your COVID test is pending.  You should self quarantine until the test result is back.    Take Tylenol as needed for fever or discomfort.  Rest and keep yourself hydrated.    Go to the emergency department if you develop acute worsening symptoms.     

## 2020-02-15 LAB — SARS CORONAVIRUS 2 (TAT 6-24 HRS): SARS Coronavirus 2: NEGATIVE

## 2020-03-16 IMAGING — CT CT ANGIO CHEST-ABD-PELV FOR DISSECTION W/ AND WO/W CM
2 of 7 series · 14 of 46 positions shown, 16 images · IV contrast (iopamidol)
Comparison: None.

CLINICAL DATA: Back and abdominal pain over the last week. Assess
for vascular dissection.

EXAM:
CT ANGIOGRAPHY CHEST, ABDOMEN AND PELVIS
TECHNIQUE: Multidetector CT imaging through the chest, abdomen and pelvis was
performed using the standard protocol during bolus administration of
intravenous contrast. Multiplanar reconstructed images and MIPs were
obtained and reviewed to evaluate the vascular anatomy.
CONTRAST:  100mL LOCO0D-8Z5 IOPAMIDOL (LOCO0D-8Z5) INJECTION 76%

[Series 8: dissection 2mm · axial · 0.59mm/px · z∈[+758,+1314]mm · 11 of 312 slices shown, 13 images]
[im 17/312  soft-tissue]
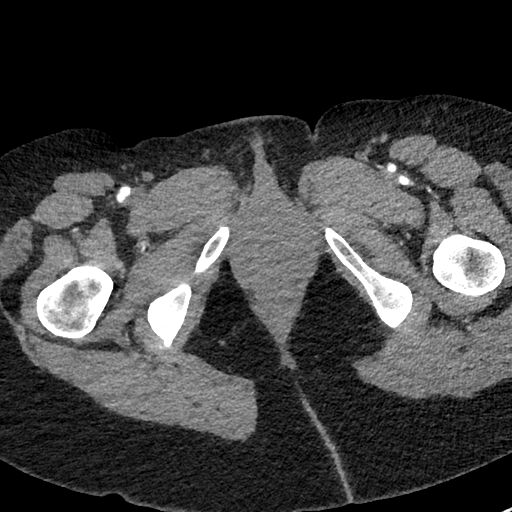
[im 17/312  bone]
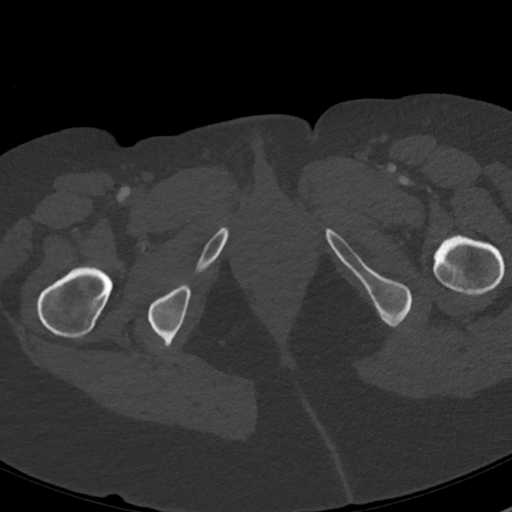
[im 50/312  soft-tissue]
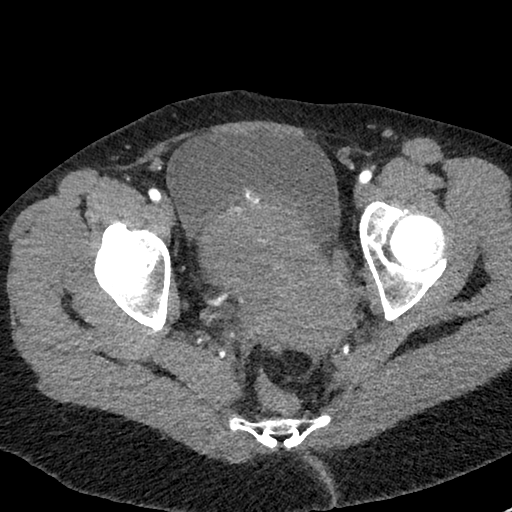
[im 82/312  soft-tissue]
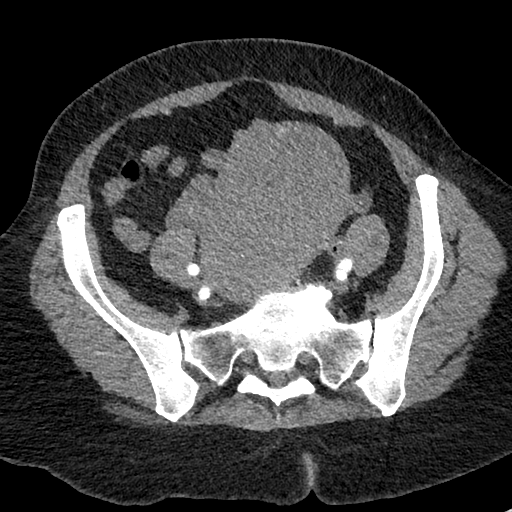
[im 99/312  soft-tissue]
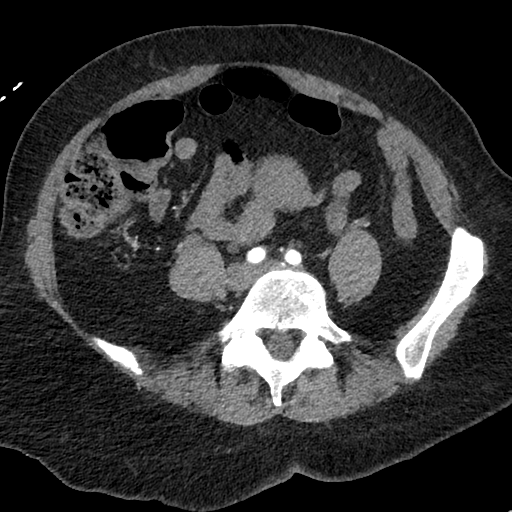
[im 131/312  soft-tissue]
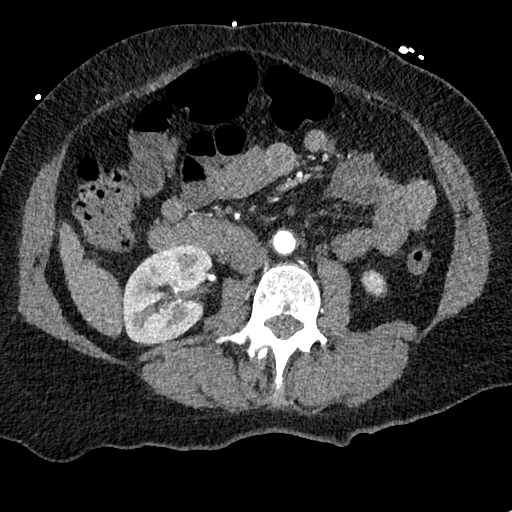
[im 164/312  soft-tissue]
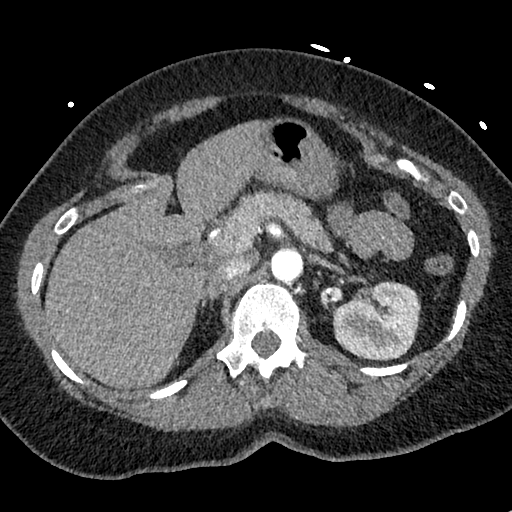
[im 181/312  soft-tissue]
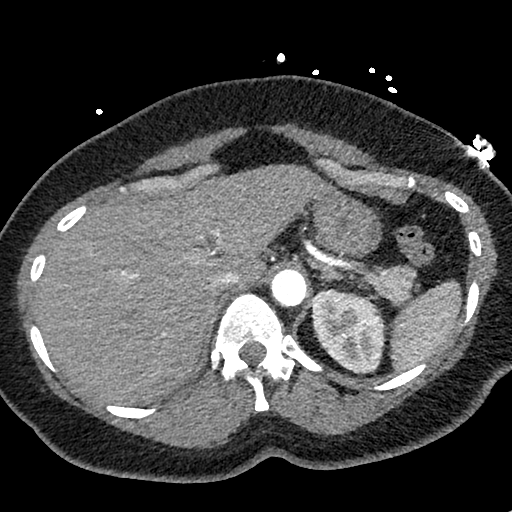
[im 213/312  soft-tissue]
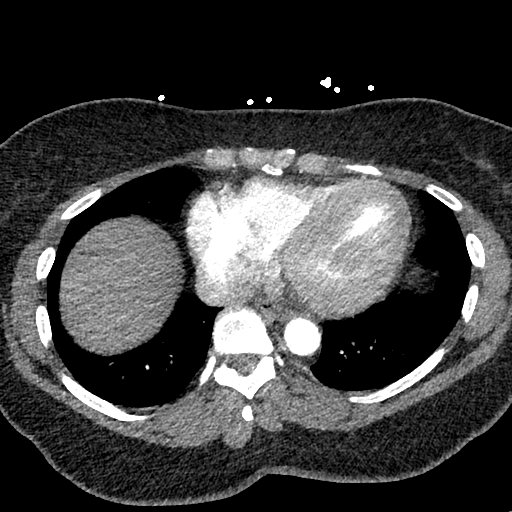
[im 230/312  soft-tissue]
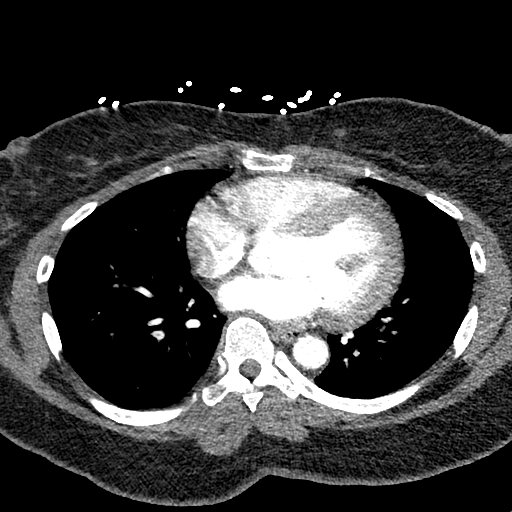
[im 230/312  bone]
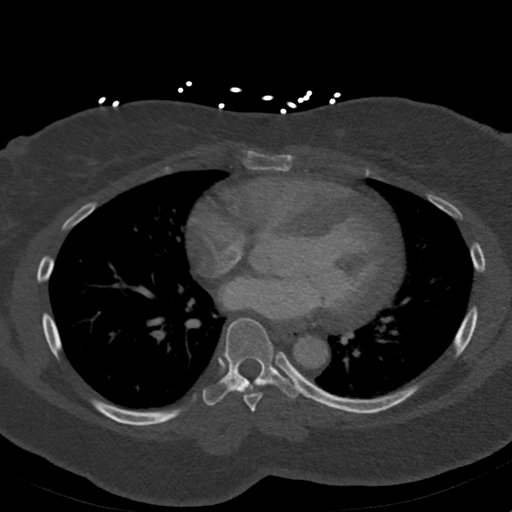
[im 262/312  soft-tissue]
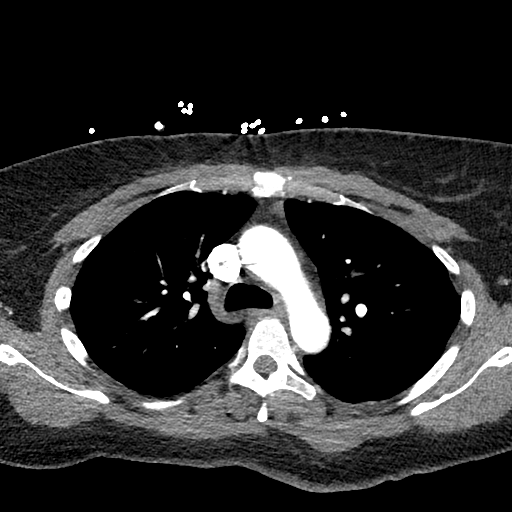
[im 295/312  soft-tissue]
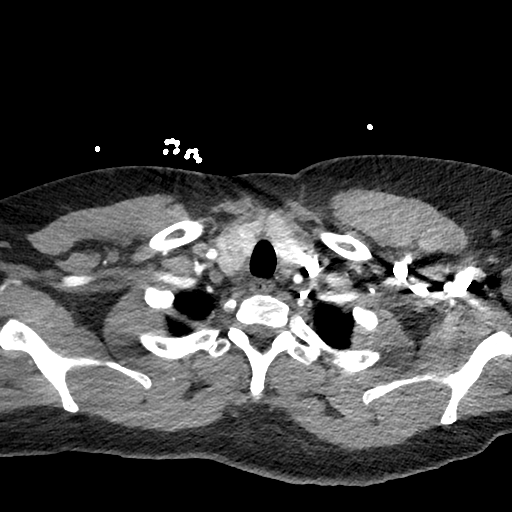

[Series 11: dissection 2mm cor · coronal · 0.68mm/px · 3 of 151 slices shown]
[im 38/151  soft-tissue]
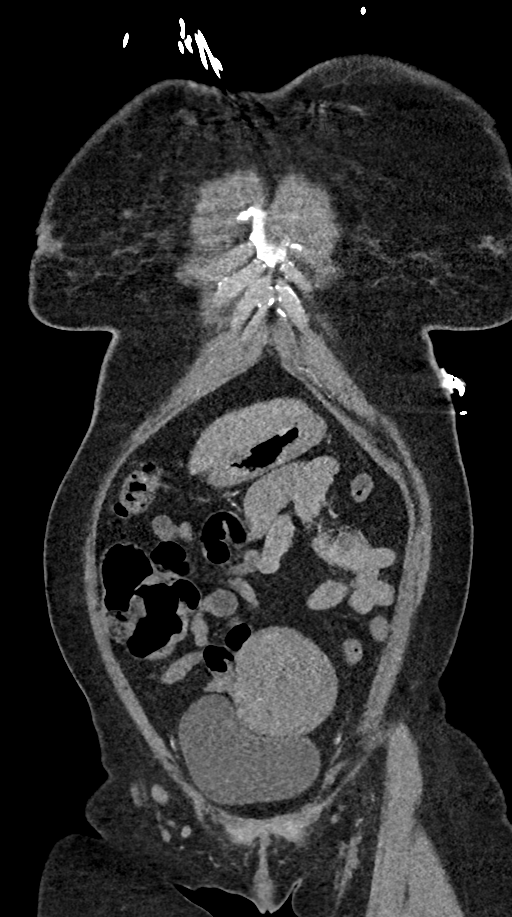
[im 76/151  soft-tissue]
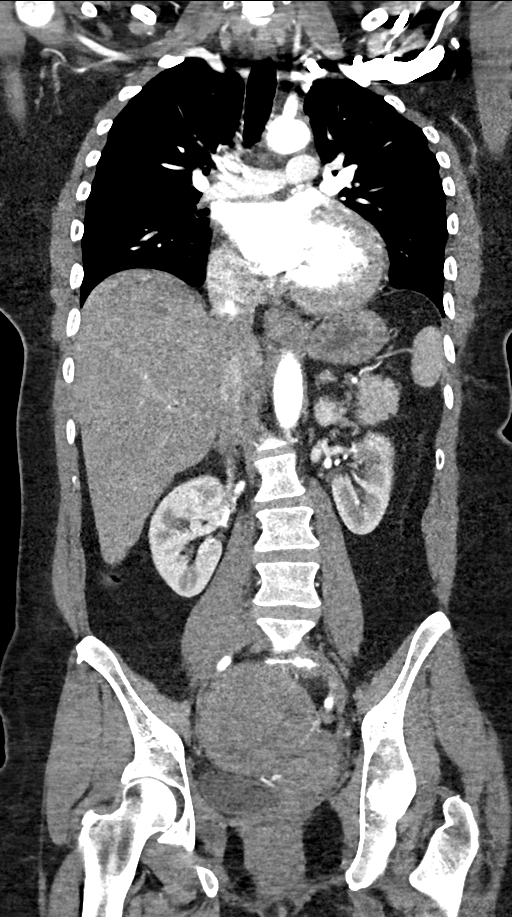
[im 113/151  soft-tissue]
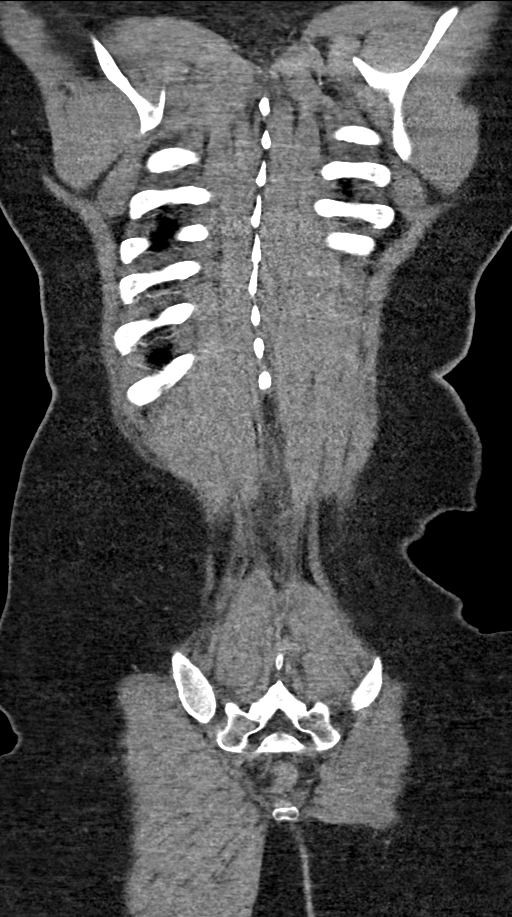

[14 of 46 positions shown; findings below may reference images not displayed]

FINDINGS: CTA CHEST FINDINGS

Cardiovascular: Heart size is mildly enlarged. No coronary artery
calcification. Thoracic aorta is normal without enlargement of the
ascending aorta or any evidence of dissection or atherosclerotic
plaque. Brachiocephalic origins are normal.

Mediastinum/Nodes: No mass or lymphadenopathy.

Lungs/Pleura: Lung parenchyma is normal and clear. No emphysema. No
infiltrate or collapse. Insignificant 3 mm subpleural nodule
laterally in the right lung image 80. No pleural fluid.

Musculoskeletal: Thoracolumbar spinal curvature. No evidence of
fracture or focal bone lesion.

Review of the MIP images confirms the above findings.

CTA ABDOMEN AND PELVIS FINDINGS

VASCULAR

Aorta: No aortic aneurysm, dissection or atherosclerotic change in
the proximal portion. There is some atherosclerotic plaque of the
distal abdominal aorta, noncalcified.

Celiac: Normal

SMA: Normal

Renals: Normal.  Single on each side.

IMA: 50% origin stenosis.  Otherwise normal.

Inflow: Normal

Veins: Normal

Review of the MIP images confirms the above findings.

NON-VASCULAR

Hepatobiliary: Normal

Pancreas: Normal

Spleen: Normal

Adrenals/Urinary Tract: Adrenal glands are normal. Kidneys are
normal. Bladder is normal.

Stomach/Bowel: No bowel pathology is seen.

Lymphatic: No mass or lymphadenopathy.

Reproductive: Markedly enlarged uterus probably due to a large
leiomyoma. This measures 13 cm in length with a transverse diameter
up to 12 x 9 cm.

Other: No free fluid or air.

Musculoskeletal: Spinal curvature as noted above. Degenerative disc
disease and facet disease at L5-S1.

Review of the MIP images confirms the above findings.
IMPRESSION: No evidence of acute aortic dissection or acute vascular pathology.
There is some atherosclerotic plaque of the distal abdominal aorta
but no aneurysm or flow limiting stenosis. 50% stenosis of the
proximal inferior mesenteric artery.

Spinal scoliotic curvature. Lower lumbar degenerative disc disease
and facet disease.

Marked enlargement of the uterus probably secondary to large
leiomyoma, measuring up to 13 x 12 x 9 cm.

## 2020-03-25 ENCOUNTER — Ambulatory Visit: Payer: Medicaid Other

## 2020-04-06 ENCOUNTER — Ambulatory Visit: Payer: Medicaid Other

## 2020-04-19 ENCOUNTER — Ambulatory Visit (INDEPENDENT_AMBULATORY_CARE_PROVIDER_SITE_OTHER): Payer: Self-pay

## 2020-04-19 ENCOUNTER — Other Ambulatory Visit: Payer: Self-pay

## 2020-04-19 DIAGNOSIS — Z23 Encounter for immunization: Secondary | ICD-10-CM

## 2020-04-19 NOTE — Progress Notes (Signed)
   Covid-19 Vaccination Clinic  Name:  Susan Sanders    MRN: [id number] DOB: 06/16/1970  04/19/2020 Patient presents to nurse clinic for [facility] booster. Patient answers no to all screening questions and denies history of previous allergic reaction. Administered in right deltoid, site unremarkable, tolerated injection well.   Susan Sanders was observed post Covid-19 immunization for 15 minutes without incident. She was provided with Vaccine Information Sheet and instruction to access the V-Safe system.   Susan Sanders was instructed to call 911 with any severe reactions post vaccine: Marland Kitchen Difficulty breathing  . Swelling of face and throat  . A fast heartbeat  . A bad rash all over body  . Dizziness and weakness    Provided patient with updated immunization record and card.   Talbot Grumbling, RN

## 2020-04-26 NOTE — Progress Notes (Signed)
    SUBJECTIVE:   CHIEF COMPLAINT / HPI:   Ear pain, sore throat: Patient is a 49 year old female presenting today with left ear pain and sore throat for 2-3days. No loss of hearing that she notice. No fever, cough, runny nose, vomiting, diarrhea, no sick contacts. Full vaccinated against covid. Tried tylenol and heating back on side of head for ear pain.  Ear pain seems to be worse when lying on that side on a pillow.  Patient states that she works for [employer].  PERTINENT  PMH / PSH: None relevant  OBJECTIVE:   LMP 06/24/2018    General: Alert and oriented in no apparent distress HEENT: No pharyngeal erythema.  Right ear canal and tympanic membrane visible with no signs of erythema or trauma.  Left ear canal with large piece of hardened wax on the superior aspect just inside the ear canal which was tender when pressed against with the speculum.  Left ear tympanic membrane visible with no erythema nonbulging, no signs of infection of the left ear canal.  No pain with TMJ testing Heart: Regular rate and rhythm with no murmurs appreciated Lungs: Clear to auscultation bilaterally Abdomen: Bowel sounds present, no abdominal pain Skin: Warm and dry  ASSESSMENT/PLAN:   Viral URI with sore throat Overall assessment is a 49 year old female with 3 days of sore throat.  Physical exam shows no pharyngeal erythema.  Overall differential for her sore throat can include seasonal allergies with postnasal drip but more concerning we can include viral URIs such as COVID-19, the common cold, influenza, or variety of other viruses.  Patient without any concerning symptoms, lungs clear to auscultation, no cough, no fever, no vomiting or diarrhea.  She denies any sick contacts.  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  She is fully vaccinated for COVID-19 but with her currently having symptoms I did recommend that she reach out to health at work for further recommendations and that  she does pursue COVID-19 testing. 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  When the speculum was pressed against it on visualization shin of her ear canal I did cause the same discomfort.  Overall her ear canal does not show any signs of infection and the tympanic membrane is appropriate.  Unlikely TMJ due to negative pain when testing. Plan: -Recommend patient use either Debrox or hydrogen peroxide to loosen the wax followed by rinsing the ear to remove the wax -Discussed with patient that if her symptoms not improve in the next few days that she should return   [name], [place]    This note was prepared using Dragon voice recognition software and may include unintentional dictation errors due to the inherent limitations of voice recognition software.

## 2020-04-26 NOTE — Patient Instructions (Signed)
It was great to see you! Thank you for allowing me to participate in your care!  Our plans for today:  -Due to the fact that you have a sore throat I do recommend that she get a COVID-19 test.  You should stay out of work until the results of your COVID-19 return.  Please reach out to health at work to let them know that she saw physician today and they recommended getting a COVID-19 test.  They will let you know the best place to go and get it. -If you develop any shortness of breath, chest pain, vomiting to where you are unable to keep down food, high fevers please go to the emergency department -For your ear pain I think this is due to a large piece of wax on the upper part of your ear just inside the canal, year eardrum and ear canal do not look like to have any signs of infection.  You can use some Debrox or some hydrogen peroxide inside the ear to help loosen up that earwax and that should improve your symptoms.  If it does not get better in the next few days I would like for you to return.   Take care and seek immediate care sooner if you develop any concerns.   Dr. [name], [place]

## 2020-04-27 ENCOUNTER — Other Ambulatory Visit: Payer: Self-pay

## 2020-04-27 ENCOUNTER — Ambulatory Visit (INDEPENDENT_AMBULATORY_CARE_PROVIDER_SITE_OTHER): Payer: Self-pay | Admitting: Family Medicine

## 2020-04-27 VITALS — HR 91

## 2020-04-27 DIAGNOSIS — J069 Acute upper respiratory infection, unspecified: Secondary | ICD-10-CM

## 2020-04-27 DIAGNOSIS — H9202 Otalgia, left ear: Secondary | ICD-10-CM

## 2020-09-12 ENCOUNTER — Encounter: Payer: Self-pay | Admitting: Family Medicine

## 2020-09-12 ENCOUNTER — Ambulatory Visit (INDEPENDENT_AMBULATORY_CARE_PROVIDER_SITE_OTHER): Payer: Self-pay | Admitting: Family Medicine

## 2020-09-12 ENCOUNTER — Other Ambulatory Visit: Payer: Self-pay

## 2020-09-12 VITALS — BP 138/72 | HR 80 | Wt 209.0 lb

## 2020-09-12 DIAGNOSIS — M545 Low back pain, unspecified: Secondary | ICD-10-CM

## 2020-09-12 DIAGNOSIS — R1030 Lower abdominal pain, unspecified: Secondary | ICD-10-CM

## 2020-09-12 DIAGNOSIS — S46911A Strain of unspecified muscle, fascia and tendon at shoulder and upper arm level, right arm, initial encounter: Secondary | ICD-10-CM | POA: Insufficient documentation

## 2020-09-12 LAB — POCT URINALYSIS DIP (MANUAL ENTRY)
Bilirubin, UA: NEGATIVE
Glucose, UA: NEGATIVE mg/dL
Leukocytes, UA: NEGATIVE
Nitrite, UA: NEGATIVE
Protein Ur, POC: NEGATIVE mg/dL
Spec Grav, UA: >=1.03 — AB (ref 1.010–1.025)
Urobilinogen, UA: 0.2 E.U./dL
pH, UA: 5.5 (ref 5.0–8.0)

## 2020-09-12 MED ORDER — DICLOFENAC SODIUM 1 % EX GEL: 2.0000 g | Freq: Four times a day (QID) | CUTANEOUS | 1 refills | Status: AC

## 2020-09-12 MED ORDER — LIDOCAINE 5 % EX PTCH: 1.0000 | MEDICATED_PATCH | CUTANEOUS | 0 refills | Status: AC
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
## 2020-09-22 ENCOUNTER — Ambulatory Visit (INDEPENDENT_AMBULATORY_CARE_PROVIDER_SITE_OTHER): Payer: Self-pay | Admitting: Family Medicine

## 2020-09-22 ENCOUNTER — Other Ambulatory Visit: Payer: Self-pay

## 2020-09-22 ENCOUNTER — Encounter: Payer: Self-pay | Admitting: Family Medicine

## 2020-09-22 VITALS — BP 167/94 | HR 82 | Ht 65.0 in | Wt 209.4 lb

## 2020-09-22 DIAGNOSIS — M545 Low back pain, unspecified: Secondary | ICD-10-CM

## 2020-09-22 NOTE — Patient Instructions (Signed)
It was great to see you! Thank you for allowing me to participate in your care!  I recommend that you always bring your medications to each appointment as this makes it easy to ensure we are on the correct medications and helps [place] not miss when refills are needed.  Our plans for today:  -I would like for you to continue to monitor back pain for the next 2 weeks and make a follow-up with me in about 2 weeks.  If your back pain has resolved or significantly improved by then we do not need to do anything further.  If it does not we should revisit the option of possibly doing some x-rays.  If you develop any worsening symptoms, loss of bowel or bladder function, or numbness in in your crotch region please let us know immediately.  Take care and seek immediate care sooner if you develop any concerns.   Dr. [name], [place]
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
## 2020-09-23 ENCOUNTER — Ambulatory Visit (AMBULATORY_SURGERY_CENTER): Payer: Self-pay | Admitting: *Deleted

## 2020-09-23 ENCOUNTER — Other Ambulatory Visit: Payer: Self-pay

## 2020-09-23 VITALS — Ht 65.0 in | Wt 209.0 lb

## 2020-09-23 DIAGNOSIS — Z1211 Encounter for screening for malignant neoplasm of colon: Secondary | ICD-10-CM

## 2020-09-23 NOTE — Progress Notes (Signed)
Pt states she has a "hard time waking up after anesthesia", denies being told she was difficult to intubate or hx/ fam hx of malignant hyperthermia  Pt's previsit is done over the phone and all paperwork (prep instructions, blank consent form to just read over) sent to patient.  Pt's name and DOB verified at the beginning of the previsit.  Pt denies any difficulty with ambulating.    No egg or soy allergy  No home oxygen use   No medications for weight loss taken  emmi information given  Pt denies constipation issues

## 2020-10-07 ENCOUNTER — Other Ambulatory Visit: Payer: Self-pay

## 2020-10-07 ENCOUNTER — Encounter: Payer: Self-pay | Admitting: Gastroenterology

## 2020-10-07 ENCOUNTER — Ambulatory Visit (AMBULATORY_SURGERY_CENTER): Payer: Self-pay | Admitting: Gastroenterology

## 2020-10-07 VITALS — BP 176/95 | HR 74 | Temp 96.4°F | Resp 17 | Ht 65.0 in | Wt 209.0 lb

## 2020-10-07 DIAGNOSIS — K635 Polyp of colon: Secondary | ICD-10-CM

## 2020-10-07 DIAGNOSIS — D124 Benign neoplasm of descending colon: Secondary | ICD-10-CM

## 2020-10-07 DIAGNOSIS — D123 Benign neoplasm of transverse colon: Secondary | ICD-10-CM

## 2020-10-07 DIAGNOSIS — Z1211 Encounter for screening for malignant neoplasm of colon: Secondary | ICD-10-CM

## 2020-10-07 MED ORDER — SODIUM CHLORIDE 0.9 % IV SOLN: 500.0000 mL | Freq: Once | INTRAVENOUS | Status: DC

## 2020-10-07 NOTE — Patient Instructions (Signed)
Handouts Provided:  Polyps  YOU HAD AN ENDOSCOPIC PROCEDURE TODAY AT THE [facility]:   Refer to the procedure report that was given to you for any specific questions about what was found during the examination.  If the procedure report does not answer your questions, please call your gastroenterologist to clarify.  If you requested that your care partner not be given the details of your procedure findings, then the procedure report has been included in a sealed envelope for you to review at your convenience later.  YOU SHOULD EXPECT: Some feelings of bloating in the abdomen. Passage of more gas than usual.  Walking can help get rid of the air that was put into your GI tract during the procedure and reduce the bloating. If you had a lower endoscopy (such as a colonoscopy or flexible sigmoidoscopy) you may notice spotting of blood in your stool or on the toilet paper. If you underwent a bowel prep for your procedure, you may not have a normal bowel movement for a few days.  Please Note:  You might notice some irritation and congestion in your nose or some drainage.  This is from the oxygen used during your procedure.  There is no need for concern and it should clear up in a day or so.  SYMPTOMS TO REPORT IMMEDIATELY:   Following lower endoscopy (colonoscopy or flexible sigmoidoscopy):  Excessive amounts of blood in the stool  Significant tenderness or worsening of abdominal pains  Swelling of the abdomen that is new, acute  Fever of 100F or higher  For urgent or emergent issues, a gastroenterologist can be reached at any hour by calling [phone]. Do not use MyChart messaging for urgent concerns.    DIET:  We do recommend a small meal at first, but then you may proceed to your regular diet.  Drink plenty of fluids but you should avoid alcoholic beverages for 24 hours.  ACTIVITY:  You should plan to take it easy for the rest of today and you should NOT DRIVE or use heavy  machinery until tomorrow (because of the sedation medicines used during the test).    FOLLOW UP: Our staff will call the number listed on your records 48-72 hours following your procedure to check on you and address any questions or concerns that you may have regarding the information given to you following your procedure. If we do not reach you, we will leave a message.  We will attempt to reach you two times.  During this call, we will ask if you have developed any symptoms of COVID 19. If you develop any symptoms (ie: fever, flu-like symptoms, shortness of breath, cough etc.) before then, please call [phone].  If you test positive for Covid 19 in the 2 weeks post procedure, please call and report this information to us.    If any biopsies were taken you will be contacted by phone or by letter within the next 1-3 weeks.  Please call us at [phone] if you have not heard about the biopsies in 3 weeks.    SIGNATURES/CONFIDENTIALITY: You and/or your care partner have signed paperwork which will be entered into your electronic medical record.  These signatures attest to the fact that that the information above on your After Visit Summary has been reviewed and is understood.  Full responsibility of the confidentiality of this discharge information lies with you and/or your care-partner.  

## 2020-10-07 NOTE — Op Note (Signed)
[facility] Patient Name: Susan Sanders Procedure Date: 10/07/2020 11:27 AM MRN: [id number] Endoscopist: Thornton Park MD, MD Age: 50 Referring MD:  Date of Birth: November 07, 1970 Gender: Female Account #: [id number] Procedure:                Colonoscopy Indications:              Screening for colorectal malignant neoplasm, This                            is the patient's first colonoscopy                           No known family history of colon cancer or polyps Medicines:                Monitored Anesthesia Care Procedure:                Pre-Anesthesia Assessment:                           - Prior to the procedure, a History and Physical                            was performed, and patient medications and                            allergies were reviewed. The patient's tolerance of                            previous anesthesia was also reviewed. The risks                            and benefits of the procedure and the sedation                            options and risks were discussed with the patient.                            All questions were answered, and informed consent                            was obtained. Prior Anticoagulants: The patient has                            taken no previous anticoagulant or antiplatelet                            agents. ASA Grade Assessment: II - A patient with                            mild systemic disease. After reviewing the risks                            and benefits, the patient was deemed in  satisfactory condition to undergo the procedure.                           After obtaining informed consent, the colonoscope                            was passed under direct vision. Throughout the                            procedure, the patient's blood pressure, pulse, and                            oxygen saturations were monitored continuously. The                            Olympus CF-HQ190  [id number]) Colonoscope was                            introduced through the anus and advanced to the 3                            cm into the ileum. A second forward view of the                            right colon was performed. The colonoscopy was                            performed without difficulty. The patient tolerated                            the procedure well. The quality of the bowel                            preparation was good. The terminal ileum, ileocecal                            valve, appendiceal orifice, and rectum were                            photographed. Scope In: 11:40:21 AM Scope Out: 11:53:55 AM Scope Withdrawal Time: 0 hours 9 minutes 43 seconds  Total Procedure Duration: 0 hours 13 minutes 34 seconds  Findings:                 The perianal and digital rectal examinations were                            normal.                           A less than 1 mm polyp was found in the appendiceal                            orifice. The polyp was flat. The polyp was removed  with a cold biopsy forceps. Resection and retrieval                            were complete. Estimated blood loss was minimal.                           A 4-5 mm polyp was found in the transverse colon.                            The polyp was semi-pedunculated. The polyp was                            removed with a cold snare. Resection and retrieval                            were complete. Estimated blood loss was minimal.                           A 2 mm polyp was found in the descending colon. The                            polyp was flat. The polyp was removed with a cold                            snare. Resection and retrieval were complete.                            Estimated blood loss was minimal.                           The exam was otherwise without abnormality on                            direct and retroflexion views. Complications:             No immediate complications. Estimated blood loss:                            Minimal. Estimated Blood Loss:     Estimated blood loss was minimal. Impression:               - One less than 1 mm polyp at the appendiceal                            orifice, removed with a cold biopsy forceps.                            Resected and retrieved.                           - One 4-5 mm polyp in the transverse colon, removed                            with a cold snare. Resected  and retrieved.                           - One 2 mm polyp in the descending colon, removed                            with a cold snare. Resected and retrieved.                           - The examination was otherwise normal on direct                            and retroflexion views. Recommendation:           - Patient has a contact number available for                            emergencies. The signs and symptoms of potential                            delayed complications were discussed with the                            patient. Return to normal activities tomorrow.                            Written discharge instructions were provided to the                            patient.                           - Resume previous diet.                           - Continue present medications.                           - Await pathology results.                           - Repeat colonoscopy date to be determined after                            pending pathology results are reviewed for                            surveillance.                           - Emerging evidence supports eating a diet of                            fruits, vegetables, grains, calcium, and yogurt                            while reducing red meat  and alcohol may reduce the                            risk of colon cancer.                           - Thank you for allowing me to be involved in your                            colon cancer  prevention. Thornton Park MD, MD 10/07/2020 12:03:41 PM This report has been signed electronically.

## 2020-10-07 NOTE — Progress Notes (Signed)
Medical history reviewed with no changes since PV. VS assessed by [facility]

## 2020-10-07 NOTE — Progress Notes (Signed)
Called to room to assist during endoscopic procedure.  Patient ID and intended procedure confirmed with present staff. Received instructions for my participation in the procedure from the performing physician.  

## 2020-10-07 NOTE — Progress Notes (Signed)
Report to PACU, RN, vss, BBS= Clear.  

## 2020-10-11 ENCOUNTER — Telehealth: Payer: Self-pay | Admitting: *Deleted

## 2020-10-11 ENCOUNTER — Telehealth: Payer: Self-pay

## 2020-10-11 NOTE — Telephone Encounter (Signed)
  Follow up Call-  Call back number 10/07/2020  Post procedure Call Back phone  # [phone]  Permission to leave phone message Yes  Some recent data might be hidden     Patient questions:  Do you have a fever, pain , or abdominal swelling? No. Pain Score  0 *  Have you tolerated food without any problems? Yes.    Have you been able to return to your normal activities? Yes.    Do you have any questions about your discharge instructions: Diet   No. Medications  No. Follow up visit  No.  Do you have questions or concerns about your Care? No.  Actions: * If pain score is 4 or above: No action needed, pain <4.  1. Have you developed a fever since your procedure? no  2.   Have you had an respiratory symptoms (SOB or cough) since your procedure? no  3.   Have you tested positive for COVID 19 since your procedure no  4.   Have you had any family members/close contacts diagnosed with the COVID 19 since your procedure?  no   If yes to any of these questions please route to Joylene John, RN and Joella Prince, RN

## 2020-10-11 NOTE — Telephone Encounter (Signed)
Left message on follow up call. 
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
## 2020-10-12 NOTE — Patient Instructions (Signed)
It was great to see you! Thank you for allowing me to participate in your care!  I recommend that you always bring your medications to each appointment as this makes it easy to ensure we are on the correct medications and helps [place] not miss when refills are needed.  Our plans for today:  -We are starting new blood pressure medicine called losartan.  I sent this to your pharmacy. -I would like to see back in 1 week to check your blood pressure, check your kidney function, and check your potassium level. -In the meantime I would like for you to check your blood pressure at least twice per day with an upper arm blood pressure cuff and write these numbers down.  If you see any numbers above 200 on the top number or above 110 on the bottom number please let me know. -For your shoulder pain I want you to focus on the stretches I showed you today as well as use heat and ice on this.  If it gets worse please let me know but it should improve with time.   Take care and seek immediate care sooner if you develop any concerns.   Dr. [name], [place]

## 2020-10-13 ENCOUNTER — Other Ambulatory Visit: Payer: Self-pay

## 2020-10-13 ENCOUNTER — Encounter: Payer: Self-pay | Admitting: Family Medicine

## 2020-10-13 ENCOUNTER — Ambulatory Visit (INDEPENDENT_AMBULATORY_CARE_PROVIDER_SITE_OTHER): Payer: Self-pay | Admitting: Family Medicine

## 2020-10-13 VITALS — BP 216/95 | HR 74 | Ht 65.0 in | Wt 210.4 lb

## 2020-10-13 DIAGNOSIS — I10 Essential (primary) hypertension: Secondary | ICD-10-CM

## 2020-10-13 DIAGNOSIS — M62838 Other muscle spasm: Secondary | ICD-10-CM | POA: Insufficient documentation

## 2020-10-13 MED ORDER — LOSARTAN POTASSIUM 25 MG PO TABS: 25.0000 mg | ORAL_TABLET | Freq: Every day | ORAL | 3 refills | Status: DC
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
## 2020-10-13 NOTE — Assessment & Plan Note (Signed)
Assessment: 50 year old female with greatly elevated blood pressures with initial blood pressure of 216/95, 178/188 on recheck.  She only takes amlodipine 10 mg daily at home. -We will start losartan -We will have patient check her blood pressure at least twice per day at home for the next 1 week -Patient will follow up with me in 1 week for a BMP to monitor renal function and potassium

## 2020-10-18 NOTE — Progress Notes (Signed)
    SUBJECTIVE:   CHIEF COMPLAINT / HPI:   Follow-up-hypertension: Patient is a G77 50-year-old female who presents today for following up on hypertension.  She was seen by myself on 10/13/2020 and noted to have a very elevated blood pressure of 216/95.  This was rechecked and found to be 178/108 by the end of the appointment.  At that time we had initiated losartan.  Patient continued on her home amlodipine of 10 mg/day.  I had also recommended the patient check her blood pressures at least twice a day for the next 1 week prior to this appointment.  Today she states that she has been checking her blood pressures at home and is seeing numbers in the 140s to 160s but they are much better than the last appointment.  She continues to take her medication and has been able to lose 2 pounds since the last appointment.  She is going to start walking on a regular basis and has cut out sodas completely over the past 1 week.Marland Kitchen  PERTINENT  PMH / PSH: History of hypertension  OBJECTIVE:   BP (!) 142/82   Pulse 70   Ht 5\' 5"  (1.651 m)   Wt 208 lb (94.3 kg)   LMP 06/24/2018   SpO2 98%   BMI 34.61 kg/m    General: NAD, pleasant, able to participate in exam Respiratory: No respiratory distress Skin: warm and dry, no rashes noted Psych: Normal affect and mood  ASSESSMENT/PLAN:   Essential hypertension Assessment: Patient has a 50 y.o. female who presents for follow-up with elevated blood pressures.  At her previous appointment we had initiated losartan and she plan to follow-up today to check a BMP to make sure that her potassium and kidney function are appropriate.  Blood pressure today of 142/82. Plan: -We will plan to increase patient's losartan to 50 mg/day -We will check a BMP as she recently started on losartan last week    [name], [place]    This note was prepared using Dragon voice recognition software and may include unintentional dictation errors due to  the inherent limitations of voice recognition software.

## 2020-10-18 NOTE — Patient Instructions (Signed)
It was great to see you! Thank you for allowing me to participate in your care!  I recommend that you always bring your medications to each appointment as this makes it easy to ensure we are on the correct medications and helps [place] not miss when refills are needed.  Our plans for today:  -Your blood pressure is looking much better today.  It is still a little bit elevated.  It was 142/82 in the office.  I would ideally like to see it in the low 130s or upper 120s for the top number. -We are going to increase your losartan to 50 mg/day, this is 2 tablets.  You can take these both back to back, you do not have to space them out.  I have sent in a refill at the higher dose so the next time you pick up your medicine it should say 50 mg, take two 25 mg tablets. -Congratulations on the weight loss.  If you have any questions or concerns about continuing to lose weight, dietary options, or exercise routines do not hesitate to follow-up for an appointment and we can discuss these in detail.  We are checking some labs today, I will call you if they are abnormal will send you a MyChart message or a letter if they are normal.  If you do not hear about your labs in the next 2 weeks please let us know.  Take care and seek immediate care sooner if you develop any concerns.   Dr. [name], [place]

## 2020-10-21 ENCOUNTER — Ambulatory Visit (INDEPENDENT_AMBULATORY_CARE_PROVIDER_SITE_OTHER): Payer: Medicaid Other | Admitting: Family Medicine

## 2020-10-21 ENCOUNTER — Encounter: Payer: Self-pay | Admitting: Family Medicine

## 2020-10-21 ENCOUNTER — Other Ambulatory Visit: Payer: Self-pay

## 2020-10-21 VITALS — BP 142/82 | HR 70 | Ht 65.0 in | Wt 208.0 lb

## 2020-10-21 DIAGNOSIS — I10 Essential (primary) hypertension: Secondary | ICD-10-CM

## 2020-10-21 MED ORDER — LOSARTAN POTASSIUM 25 MG PO TABS: 50.0000 mg | ORAL_TABLET | Freq: Every day | ORAL | 3 refills | Status: DC

## 2020-10-21 NOTE — Assessment & Plan Note (Signed)
Assessment: Patient has a 50 y.o. female who presents for follow-up with elevated blood pressures.  At her previous appointment we had initiated losartan and she plan to follow-up today to check a BMP to make sure that her potassium and kidney function are appropriate.  Blood pressure today of 142/82. Plan: -We will plan to increase patient's losartan to 50 mg/day -We will check a BMP as she recently started on losartan last week

## 2020-10-22 LAB — BASIC METABOLIC PANEL
BUN/Creatinine Ratio: 12 (ref 9–23)
BUN: 9 mg/dL (ref 6–24)
CO2: 20 mmol/L (ref 20–29)
Calcium: 9.5 mg/dL (ref 8.7–10.2)
Chloride: 100 mmol/L (ref 96–106)
Creatinine, Ser: 0.78 mg/dL (ref 0.57–1.00)
Glucose: 99 mg/dL (ref 65–99)
Potassium: 4.4 mmol/L (ref 3.5–5.2)
Sodium: 137 mmol/L (ref 134–144)
eGFR: 91 mL/min/{1.73_m2} (ref >59)

## 2020-10-24 ENCOUNTER — Encounter: Payer: Self-pay | Admitting: Gastroenterology

## 2020-10-27 NOTE — Progress Notes (Deleted)
    SUBJECTIVE:   CHIEF COMPLAINT / HPI:   Hypertension: Patient is 50 year old female that presents for follow-up on hypertension.  At her previous appointment she was noted to have high blood pressure of 142/82.  We had recently started losartan.  During that appointment we increased her losartan which was started a week prior to 50 mg/day.  We checked a BMP at that time to monitor her potassium and kidney function which were appropriate.  Today she states***.   PERTINENT  PMH / PSH: Essential hypertension  OBJECTIVE:   LMP 06/24/2018    General: NAD, pleasant, able to participate in exam Cardiac: RRR, no murmurs. Respiratory: CTAB, normal effort, No wheezes, rales or rhonchi Abdomen: Bowel sounds present, nontender, nondistended, no hepatosplenomegaly. Extremities: no edema or cyanosis. Skin: warm and dry, no rashes noted Neuro: alert, no obvious focal deficits Psych: Normal affect and mood  ASSESSMENT/PLAN:   No problem-specific Assessment & Plan notes found for this encounter.   Blood pressure today of***.  Continues on losartan 50 mg/day at this time.***   [name], [place]    {    This will disappear when note is signed, click to select method of visit    :1}

## 2020-10-28 ENCOUNTER — Ambulatory Visit: Payer: Medicaid Other | Admitting: Family Medicine

## 2020-11-11 ENCOUNTER — Encounter (HOSPITAL_COMMUNITY): Payer: Self-pay

## 2020-11-11 ENCOUNTER — Emergency Department (HOSPITAL_COMMUNITY)
Admission: EM | Admit: 2020-11-11 | Discharge: 2020-11-11 | Disposition: A | Discharge status: Home / Self Care | Payer: Self-pay | Attending: Emergency Medicine | Admitting: Emergency Medicine

## 2020-11-11 ENCOUNTER — Emergency Department (HOSPITAL_COMMUNITY): Payer: Self-pay

## 2020-11-11 DIAGNOSIS — W010XXA Fall on same level from slipping, tripping and stumbling without subsequent striking against object, initial encounter: Secondary | ICD-10-CM | POA: Insufficient documentation

## 2020-11-11 DIAGNOSIS — Z8542 Personal history of malignant neoplasm of other parts of uterus: Secondary | ICD-10-CM | POA: Insufficient documentation

## 2020-11-11 DIAGNOSIS — Z79899 Other long term (current) drug therapy: Secondary | ICD-10-CM | POA: Insufficient documentation

## 2020-11-11 DIAGNOSIS — Y92009 Unspecified place in unspecified non-institutional (private) residence as the place of occurrence of the external cause: Secondary | ICD-10-CM | POA: Insufficient documentation

## 2020-11-11 DIAGNOSIS — Z91040 Latex allergy status: Secondary | ICD-10-CM | POA: Insufficient documentation

## 2020-11-11 DIAGNOSIS — S300XXA Contusion of lower back and pelvis, initial encounter: Secondary | ICD-10-CM | POA: Insufficient documentation

## 2020-11-11 DIAGNOSIS — J45909 Unspecified asthma, uncomplicated: Secondary | ICD-10-CM | POA: Insufficient documentation

## 2020-11-11 DIAGNOSIS — W19XXXA Unspecified fall, initial encounter: Secondary | ICD-10-CM

## 2020-11-11 DIAGNOSIS — Z87891 Personal history of nicotine dependence: Secondary | ICD-10-CM | POA: Insufficient documentation

## 2020-11-11 DIAGNOSIS — I10 Essential (primary) hypertension: Secondary | ICD-10-CM | POA: Insufficient documentation

## 2020-11-11 MED ORDER — METHOCARBAMOL 500 MG PO TABS: 500.0000 mg | ORAL_TABLET | Freq: Two times a day (BID) | ORAL | 0 refills | Status: DC

## 2020-11-11 MED ORDER — METHOCARBAMOL 500 MG PO TABS
750.0000 mg | ORAL_TABLET | Freq: Once | ORAL | Status: AC
  Administered 2020-11-11: 750 mg via ORAL
  Filled 2020-11-11: qty 2

## 2020-11-11 MED ORDER — AMLODIPINE BESYLATE 5 MG PO TABS
10.0000 mg | ORAL_TABLET | Freq: Once | ORAL | Status: AC
  Administered 2020-11-11: 10 mg via ORAL
  Filled 2020-11-11: qty 2

## 2020-11-11 NOTE — Discharge Instructions (Addendum)
Your x-rays were negative for any fractures.  Please drink plenty of water.  Alternate between Tylenol and ibuprofen. Robaxin for pain Cool compresses Please follow-up with your primary care doctor.

## 2020-11-11 NOTE — ED Triage Notes (Signed)
Pt arrived via POV, c/o mechanical fall yesterday, c/o back pain today. No head strike. No blood thinners.
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
## 2020-12-17 ENCOUNTER — Other Ambulatory Visit: Payer: Self-pay | Admitting: Family Medicine

## 2020-12-17 DIAGNOSIS — I10 Essential (primary) hypertension: Secondary | ICD-10-CM

## 2021-04-26 ENCOUNTER — Ambulatory Visit (INDEPENDENT_AMBULATORY_CARE_PROVIDER_SITE_OTHER): Payer: Self-pay | Admitting: Family Medicine

## 2021-04-26 ENCOUNTER — Other Ambulatory Visit: Payer: Self-pay

## 2021-04-26 VITALS — BP 167/78 | HR 92 | Wt 214.6 lb

## 2021-04-26 DIAGNOSIS — B349 Viral infection, unspecified: Secondary | ICD-10-CM

## 2021-04-26 DIAGNOSIS — I10 Essential (primary) hypertension: Secondary | ICD-10-CM

## 2021-04-26 DIAGNOSIS — R509 Fever, unspecified: Secondary | ICD-10-CM

## 2021-04-26 LAB — POCT INFLUENZA A/B
Influenza A, POC: NEGATIVE
Influenza B, POC: NEGATIVE

## 2021-04-26 NOTE — Patient Instructions (Addendum)
It was nice seeing you today!  Covid test will be available in the next 1-2 days.  I recommend quarantining at home until your symptoms are better.  I recommend honey for cough.  Stay hydrated as best as you can. If you can get some Pedialyte, it is more hydrating than Gatorade.  Please arrive at least 15 minutes prior to your scheduled appointments.  Stay well, Zola Button, MD [place] [phone]

## 2021-04-26 NOTE — Progress Notes (Signed)
° ° °  SUBJECTIVE:   CHIEF COMPLAINT / HPI:   Patient states she has been feeling ill since Sunday, 3 days ago.  Symptoms include fever with T-max 102 F, chills, myalgias, headaches, diarrhea (4-5 episodes per day), cough, sore throat, decreased appetite, ear discomfort.  Last fever was last night. 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  She took a rapid COVID test 2 days ago which was negative.  Denies chest pain, shortness of breath, vision changes.  Did not receive the flu shot this year.  Fully vaccinated against COVID but has not received the new bivalent booster.  She states she had 1 elevated blood pressure of 200/90 but otherwise they have been on the lower side approximately 100/80.  PERTINENT  PMH / PSH: HTN, GERD, anxiety  OBJECTIVE:   BP (!) 167/78    Pulse 92    Wt 214 lb 9.6 oz (97.3 kg)    LMP 06/24/2018    SpO2 99%    BMI 35.71 kg/m   General: Tired appearing middle-aged female, NAD HEENT: MMM, posterior oropharynx clear, bilateral TMs clear CV: RRR, no murmurs, no leg swelling Pulm: CTAB, no wheezes or rales Abdomen: Soft, nontender  ASSESSMENT/PLAN:   Viral illness POC flu test negative.  COVID test pending.  Suspect underlying viral illness based on symptoms, possibly COVID despite prior negative test.  Low suspicion for bacterial etiology at this time.  Advised supportive care, quarantine until COVID test results.  Return precautions given.  Essential hypertension Elevated in office today.  She has had some lower blood pressure readings at home so we will not make any adjustments today.  She will need follow-up when feeling better.     Zola Button, MD [place]

## 2021-04-26 NOTE — Assessment & Plan Note (Signed)
Elevated in office today.  She has had some lower blood pressure readings at home so we will not make any adjustments today.  She will need follow-up when feeling better.

## 2021-04-27 LAB — SARS-COV-2, NAA 2 DAY TAT

## 2021-04-27 LAB — NOVEL CORONAVIRUS, NAA: SARS-CoV-2, NAA: NOT DETECTED

## 2021-05-10 ENCOUNTER — Emergency Department (HOSPITAL_COMMUNITY): Payer: Self-pay

## 2021-05-10 ENCOUNTER — Ambulatory Visit: Payer: Medicaid Other | Admitting: Family Medicine

## 2021-05-10 ENCOUNTER — Encounter (HOSPITAL_COMMUNITY): Payer: Self-pay | Admitting: Emergency Medicine

## 2021-05-10 ENCOUNTER — Emergency Department (HOSPITAL_COMMUNITY)
Admission: EM | Admit: 2021-05-10 | Discharge: 2021-05-10 | Disposition: A | Discharge status: Home / Self Care | Payer: Self-pay | Attending: Emergency Medicine | Admitting: Emergency Medicine

## 2021-05-10 ENCOUNTER — Other Ambulatory Visit: Payer: Self-pay

## 2021-05-10 DIAGNOSIS — Z87891 Personal history of nicotine dependence: Secondary | ICD-10-CM | POA: Insufficient documentation

## 2021-05-10 DIAGNOSIS — Z91040 Latex allergy status: Secondary | ICD-10-CM | POA: Insufficient documentation

## 2021-05-10 DIAGNOSIS — J45909 Unspecified asthma, uncomplicated: Secondary | ICD-10-CM | POA: Insufficient documentation

## 2021-05-10 DIAGNOSIS — I10 Essential (primary) hypertension: Secondary | ICD-10-CM | POA: Insufficient documentation

## 2021-05-10 DIAGNOSIS — M5412 Radiculopathy, cervical region: Secondary | ICD-10-CM | POA: Insufficient documentation

## 2021-05-10 LAB — CBC
HCT: 40.6 % (ref 36.0–46.0)
Hemoglobin: 12.7 g/dL (ref 12.0–15.0)
MCH: 25.1 pg — ABNORMAL LOW (ref 26.0–34.0)
MCHC: 31.3 g/dL (ref 30.0–36.0)
MCV: 80.4 fL (ref 80.0–100.0)
Platelets: 421 10*3/uL — ABNORMAL HIGH (ref 150–400)
RBC: 5.05 MIL/uL (ref 3.87–5.11)
RDW: 16.1 % — ABNORMAL HIGH (ref 11.5–15.5)
WBC: 8.9 10*3/uL (ref 4.0–10.5)
nRBC: 0 % (ref 0.0–0.2)

## 2021-05-10 LAB — TROPONIN I (HIGH SENSITIVITY)
Troponin I (High Sensitivity): 3 ng/L (ref <18)
Troponin I (High Sensitivity): 3 ng/L (ref <18)

## 2021-05-10 LAB — BASIC METABOLIC PANEL
Anion gap: 4 — ABNORMAL LOW (ref 5–15)
BUN: 10 mg/dL (ref 6–20)
CO2: 25 mmol/L (ref 22–32)
Calcium: 9 mg/dL (ref 8.9–10.3)
Chloride: 107 mmol/L (ref 98–111)
Creatinine, Ser: 0.77 mg/dL (ref 0.44–1.00)
GFR, Estimated: >60 mL/min (ref >60)
Glucose, Bld: 114 mg/dL — ABNORMAL HIGH (ref 70–99)
Potassium: 3.9 mmol/L (ref 3.5–5.1)
Sodium: 136 mmol/L (ref 135–145)

## 2021-05-10 MED ORDER — IBUPROFEN 800 MG PO TABS: 800.0000 mg | ORAL_TABLET | Freq: Three times a day (TID) | ORAL | 0 refills | Status: DC

## 2021-05-10 MED ORDER — KETOROLAC TROMETHAMINE 30 MG/ML IJ SOLN
30.0000 mg | Freq: Once | INTRAMUSCULAR | Status: AC
  Administered 2021-05-10: 16:00:00 30 mg via INTRAVENOUS
  Filled 2021-05-10: qty 1

## 2021-05-10 MED ORDER — PREDNISONE 20 MG PO TABS: ORAL_TABLET | ORAL | 0 refills | Status: DC

## 2021-05-10 NOTE — ED Triage Notes (Signed)
Reports R-sided chest pain since last night, some SOB, and R arm numbness/tingling. States she is in a lot of pain. Denies N/V/D or fevers.

## 2021-05-10 NOTE — ED Provider Notes (Signed)
Emergency Medicine Provider Triage Evaluation Note  Susan Sanders , a 50 y.o. female  was evaluated in triage.  Pt complains of intermittent, right-sided chest pain since last night.  Patient notes she was laying down when she noticed the right-sided chest pain.  Her right-sided chest pain radiates to her arm and neck.  She describes it as a dull sensation.  Her chest pain is worse with laying flat and better when she is sitting up straight.  She has not tried any medications for her symptoms.  She took a home COVID test that was negative.  Denies shortness of breath, fever, chills, abdominal pain, nausea, vomiting.  Has a past medical history of hypertension.  Denies medical history of diabetes, MI, CAD.   Review of Systems  Positive: Chest pain Negative: Shortness of breath  Physical Exam  BP (!) 196/87 (BP Location: Right Arm)    Pulse 91    Temp 98.7 F (37.1 C) (Oral)    Resp 19    Ht 5\' 5"  (1.651 m)    Wt 97.5 kg    LMP 06/24/2018    SpO2 100%    BMI 35.78 kg/m  Gen:   Awake, no distress   Resp:  Normal effort  MSK:   Moves extremities without difficulty  Other:  Mild tenderness to palpation to right side of chest wall.  Medical Decision Making  Medically screening exam initiated at 1:35 PM.  Appropriate orders placed.  Indyah Saulnier was informed that the remainder of the evaluation will be completed by another provider, this initial triage assessment does not replace that evaluation, and the importance of remaining in the ED until their evaluation is complete.   Jakwan Sally A, PA-C 05/10/21 1349    Blanchie Dessert, MD 05/11/21 1351

## 2021-05-10 NOTE — ED Provider Notes (Signed)
[facility] DEPT Provider Note   CSN: [id number] Arrival date & time: 05/10/21  1318     History Chief Complaint  Patient presents with   Chest Pain    Susan Sanders is a 50 y.o. female.  Patient presented to the ED today after developing right sided chest discomfort last night. She reports intermittent neck pain for 1-2 months, with pain now radiating from neck radiating into right shoulder and right arm. No known injury to neck or extremity. Patient has history of chronic back pain and scoliosis. Patient with history of hypertension, takes amlodipine and losartan, is compliant with medications. Patient is hypertensive today.  The history is provided by the patient. No language interpreter was used.  Chest Pain Pain location:  R chest Pain severity:  Moderate Timing:  Intermittent Progression:  Waxing and waning Chronicity:  New Associated symptoms: no fever   Extremity Pain This is a recurrent problem. The current episode started more than 1 week ago. The problem occurs daily. The problem has not changed since onset.Associated symptoms include chest pain. She has tried a warm compress and a cold compress for the symptoms. The treatment provided no relief.      Past Medical History:  Diagnosis Date   Allergy    Anemia    Anxiety    Cellulitis    Chest pain    Childhood asthma    Chronic back pain    "all over" (6/37/8588)   Complication of anesthesia    "difficult waking up " (03/30/2019)   Depression    Eczema    Hypertension    Scoliosis     Patient Active Problem List   Diagnosis Date Noted   Trapezius muscle spasm 10/13/2020   Acute right-sided low back pain without sciatica 09/12/2020   Seasonal allergies 08/25/2019   Leiomyoma of body of uterus 07/21/2018   Generalized anxiety disorder 06/03/2018   Overweight 11/02/2016   Essential hypertension 07/14/2015   Cervical spondylolysis 06/18/2014   GERD (gastroesophageal  reflux disease) 12/10/2013   Menorrhagia with regular cycle 03/20/2007   Anxiety and depression 01/23/2007    Past Surgical History:  Procedure Laterality Date   ABDOMINAL HYSTERECTOMY     BREAST SURGERY     breast biopsy-right breast   HYSTERECTOMY ABDOMINAL WITH SALPINGO-OOPHORECTOMY Bilateral 07/21/2018   Procedure: TOTAL HYSTERECTOMY ABDOMINAL, BILATERAL SALPINGO-OOPHORECTOMY;  Surgeon: Anastasio Auerbach, MD;  Location: [place];  Service: Gynecology;  Laterality: Bilateral;   TUBAL LIGATION       OB History     Gravida  7   Para  6   Term  4   Preterm  2   AB  1   Living  6      SAB      IAB      Ectopic  1   Multiple      Live Births              Family History  Problem Relation Age of Onset   Hypertension Mother    Bladder Cancer Mother    Colon cancer Neg Hx    Esophageal cancer Neg Hx    Stomach cancer Neg Hx    Rectal cancer Neg Hx     Social History   Tobacco Use   Smoking status: Former    Packs/day: 0.50    Years: 28.00    Pack years: 14.00    Types: Cigarettes    Quit date: 09/20/2010  Years since quitting: 10.6   Smokeless tobacco: Never  Vaping Use   Vaping Use: Never used  Substance Use Topics   Alcohol use: Yes    Alcohol/week: 2.0 standard drinks    Types: 2 Cans of beer per week   Drug use: Not Currently    Types: Marijuana    Home Medications Prior to Admission medications   Medication Sig Start Date End Date Taking? Authorizing Provider  acetaminophen (TYLENOL) 325 MG tablet Take 650 mg by mouth every 6 (six) hours as needed.    [provider]  amLODipine (NORVASC) 10 MG tablet TAKE 1 TABLET BY MOUTH AT BEDTIME 12/19/20   [name], DO  cetirizine (ZYRTEC) 10 MG tablet Take 1 tablet (10 mg total) by mouth daily. 08/25/19   Guadalupe Dawn, MD  ferrous sulfate 325 (65 FE) MG tablet Take 325 mg by mouth daily with breakfast.     [provider]  losartan (COZAAR) 25 MG tablet  Take 2 tablets (50 mg total) by mouth at bedtime. 10/21/20   [name], DO  methocarbamol (ROBAXIN) 500 MG tablet Take 1 tablet (500 mg total) by mouth 2 (two) times daily. 11/11/20   Tedd Sias, PA  MULTIPLE VITAMINS PO Take by mouth.    [provider]    Allergies    Latex, Amoxicillin, Naproxen, and Tramadol  Review of Systems   Review of Systems  Constitutional:  Negative for fever.  Cardiovascular:  Positive for chest pain.  Musculoskeletal:  Positive for neck pain.  All other systems reviewed and are negative.  Physical Exam Updated Vital Signs BP (!) 216/109    Pulse 86    Temp 98.7 F (37.1 C) (Oral)    Resp 18    Ht 5\' 5"  (1.651 m)    Wt 97.5 kg    LMP 06/24/2018    SpO2 100%    BMI 35.78 kg/m   Physical Exam Vitals and nursing note reviewed.  Constitutional:      Appearance: She is well-developed.  Cardiovascular:     Rate and Rhythm: Normal rate.  Pulmonary:     Breath sounds: Normal breath sounds.  Abdominal:     Palpations: Abdomen is soft.  Musculoskeletal:        General: Normal range of motion.     Cervical back: Normal range of motion.  Skin:    General: Skin is warm and dry.  Neurological:     General: No focal deficit present.     Mental Status: She is alert.  Psychiatric:        Mood and Affect: Mood normal.        Behavior: Behavior normal.    ED Results / Procedures / Treatments   Labs (all labs ordered are listed, but only abnormal results are displayed) Labs Reviewed  BASIC METABOLIC PANEL - Abnormal; Notable for the following components:      Result Value   Glucose, Bld 114 (*)    Anion gap 4 (*)    All other components within normal limits  CBC - Abnormal; Notable for the following components:   MCH 25.1 (*)    RDW 16.1 (*)    Platelets 421 (*)    All other components within normal limits  TROPONIN I (HIGH SENSITIVITY)  TROPONIN I (HIGH SENSITIVITY)    EKG EKG Interpretation  Date/Time:  Wednesday May 10 2021 13:31:02 EST Ventricular Rate:  93 PR Interval:  133 QRS Duration: 78 QT Interval:  354 QTC Calculation: 441 R Axis:   60 Text Interpretation: Sinus rhythm Within normal limits Confirmed by Blanchie Dessert [phone]) on 05/10/2021 1:45:19 PM  Radiology DG Chest 2 View  Result Date: 05/10/2021 CLINICAL DATA:  M in right-sided chest pain beginning last night. EXAM: CHEST - 2 VIEW COMPARISON:  Two-view chest x-ray 08/22/2017.  CTA chest 05/29/2018 FINDINGS: Heart size upper limits of normal. Lung volumes are low. No edema or effusion is present. No focal airspace disease is present. Mild rightward curvature is stable in the thoracic spine. IMPRESSION: 1. Low lung volumes. 2. No acute cardiopulmonary disease. Electronically Signed   By: San Morelle M.D.   On: 05/10/2021 14:12   DG Cervical Spine 2-3 Views  Result Date: 05/10/2021 CLINICAL DATA:  Neck pain with radiculopathy in a 50 year old female. EXAM: CERVICAL SPINE - 2-3 VIEW COMPARISON:  May 25, 2014. FINDINGS: Prevertebral soft tissues are normal. Mild straightening of normal cervical lordotic curvature. Cervical spine visualized through the top of C7, C7-T1 is obscured. Atlantoaxial degenerative changes. Moderate degenerative changes at C4-5, marked degenerative changes at C5-6 and C6-7. Again C7 is incompletely evaluated as is the cervicothoracic junction. Uncovertebral spurring and facet arthropathy also suggested this may be worse on the LEFT than the RIGHT. Tip of the odontoid is obscured. Lateral masses of C1 with symmetric appearance upon C2. IMPRESSION: 1. Moderate to marked degenerative changes in the mid cervical spine. This is due to profound disc space narrowing in the lower cervical spine with signs of facet arthropathy that appear worse on the LEFT. 2. Cervical spine without full evaluation given cervicothoracic junction is not fully assessed, also tip of the odontoid not fully visualized. Consider swimmer's view  and dedicated odontoid assessment or CT if there is concern for acute injury or as clinically warranted for further evaluation. Electronically Signed   By: Zetta Bills M.D.   On: 05/10/2021 16:42    Procedures Procedures   Medications Ordered in ED Medications  ketorolac (TORADOL) 30 MG/ML injection 30 mg (30 mg Intravenous Given 05/10/21 1551)    ED Course  I have reviewed the triage vital signs and the nursing notes.  Pertinent labs & imaging results that were available during my care of the patient were reviewed by me and considered in my medical decision making (see chart for details).    MDM Rules/Calculators/A&P                         Patient is to be discharged with recommendation to follow up with PCP in regards to today's hospital visit. Chest pain is not likely of cardiac or pulmonary etiology d/t presentation, perc negative, VSS, no tracheal deviation, no JVD or new murmur, RRR, breath sounds equal bilaterally, EKG without acute abnormalities, negative troponin, and negative CXR.   Origin of pain appears to be radicular in nature. Cervical spine films reveal marked degenerative changes. No current weakness, numbness.  Patient received toradol with significant improvement in pain. Will treat with ibuprofen and short course of steroids.  Patient noted to be hypertensive in the emergency department.  No signs of hypertensive urgency.  Discussed with patient the need for close follow-up and management by their primary care physician.         Final Clinical Impression(s) / ED Diagnoses Final diagnoses:  Cervical radiculopathy  Primary hypertension    Rx / DC Orders ED Discharge Orders          Ordered  ibuprofen (ADVIL) 800 MG tablet  3 times daily        05/10/21 1709    predniSONE (DELTASONE) 20 MG tablet        05/10/21 1709             Etta Quill, NP 05/10/21 1713    Luna Fuse, MD 05/11/21 [phone]

## 2021-05-10 NOTE — ED Notes (Signed)
Pt states understanding of dc instructions, importance of follow up, and prescription. Pt denies questions or concerns upon dc. Pt declined wheelchair assistance upon dc. Pt ambulated out of ed w/ steady gait. No belongings left in room upon dc.  

## 2021-05-10 NOTE — Discharge Instructions (Addendum)
Please refer to the attached instructions. Follow up with your primary care provider as discussed.

## 2021-08-15 ENCOUNTER — Telehealth: Payer: Self-pay | Admitting: Family Medicine

## 2021-08-16 ENCOUNTER — Telehealth: Payer: Self-pay | Admitting: Family Medicine

## 2021-08-16 NOTE — Telephone Encounter (Signed)
Encounter opened in error

## 2021-10-17 ENCOUNTER — Ambulatory Visit (INDEPENDENT_AMBULATORY_CARE_PROVIDER_SITE_OTHER): Payer: Self-pay | Admitting: Family Medicine

## 2021-10-17 ENCOUNTER — Encounter: Payer: Self-pay | Admitting: *Deleted

## 2021-10-17 ENCOUNTER — Encounter: Payer: Self-pay | Admitting: Family Medicine

## 2021-10-17 VITALS — BP 219/88 | HR 71 | Wt 214.2 lb

## 2021-10-17 DIAGNOSIS — I10 Essential (primary) hypertension: Secondary | ICD-10-CM

## 2021-10-17 DIAGNOSIS — R928 Other abnormal and inconclusive findings on diagnostic imaging of breast: Secondary | ICD-10-CM

## 2021-10-17 MED ORDER — AMLODIPINE BESYLATE 10 MG PO TABS: 10.0000 mg | ORAL_TABLET | Freq: Every day | ORAL | 3 refills | Status: DC

## 2021-10-17 MED ORDER — LOSARTAN POTASSIUM 25 MG PO TABS: 50.0000 mg | ORAL_TABLET | Freq: Every day | ORAL | 3 refills | Status: DC

## 2021-10-17 NOTE — Progress Notes (Signed)
    SUBJECTIVE:   CHIEF COMPLAINT / HPI:   HTN: States she ran of her blood pressure medicines about 3 days ago or so.  She endorses a headache.  She does not endorse any neurologic findings.. Checks them at home and sees 160s or higher on systolic.  PERTINENT  PMH / PSH: Hypertension  OBJECTIVE:   BP (!) 219/88   Pulse 71   Wt 214 lb 4 oz (97.2 kg)   LMP 06/24/2018   SpO2 99%   BMI 35.65 kg/m    General: NAD, pleasant, able to participate in exam Respiratory: No respiratory distress CTA bilaterally Cardiac: RRR Neuro: CN II through XII intact, fine touch sensation intact in upper and lower extremes bilaterally, no signs of papilledema on nondilated eye exam, strength 5/5 in upper lower extremities bilaterally  ASSESSMENT/PLAN:   HTN: Blood pressure of 219/88 and similar on recheck.  Physical exam reassuring with no signs of endorgan damage, no signs of papilledema on nondilated eye exam, normal neurologic exam.  Patient does endorse headache but is unclear if this is a cause or result of the high blood pressure.  She also endorses running out of her medications..  Current meds include amlodipine and losartan which she ran out of of a few days prior.  Provided refills for these medications today.  She is going start these medications today and follow-up in 1 to 2 days for a blood pressure check.  Previously abnormal mammogram: Previous mammogram was 06/27/2018 with recommendation for an ultrasound-guided core needle biopsy of the right breast at 12:00 due to an intraductal mass.  Patient does not think this ever occurs due to her insurance. Patient states that she was supposed to have some sort of surgical procedure biopsy but was not able to afford this due to her insurance and so this did not occur.  Her abnormal mammogram was last year.  Her insurance has not changed but she wishes to move forward with additional testing.  I have ordered diagnostic bilateral mammogram.  I have also  placed a referral for CCM group to see how we can help assist her with any issues with finances as far as getting the mammogram and any additional procedures may be needed.  [name], [place]

## 2021-10-17 NOTE — Patient Instructions (Signed)
I have refilled your blood pressure medications today.  I want you to take these medications starting today and want you to come back in 1 to 2 days for a nurse visit for blood pressure check.  I would also like to see you next week to decide if we need to add additional blood pressure medicines.  Because your previous mammogram was abnormal I am ordering a diagnostic mammogram.  I also placed a referral for care specialist who is with social work to see how we can make sure and get any additional procedures you need with regard to your previous abnormal mammogram completed.

## 2021-10-19 ENCOUNTER — Telehealth: Payer: Self-pay | Admitting: *Deleted

## 2021-10-19 NOTE — Chronic Care Management (AMB) (Signed)
  Care Management   Note  10/19/2021 Name: Susan Sanders MRN: [id number] DOB: 02-05-1971  Susan Sanders is a 51 y.o. year old female who is a primary care patient of [name], DO. I reached out to Kristin Bruins by phone today offer care coordination services.   [name] was given information about care management services today including:  Care management services include personalized support from designated clinical staff supervised by her physician, including individualized plan of care and coordination with other care providers 24/7 contact phone numbers for assistance for urgent and routine care needs. The patient may stop care management services at any time by phone call to the office staff.  Patient agreed to services and verbal consent obtained.   Follow up plan: Telephone appointment with care management team member scheduled for:10/25/21  [facility]  Direct Dial: [phone]

## 2021-10-20 ENCOUNTER — Ambulatory Visit: Payer: Medicaid Other

## 2021-10-20 VITALS — BP 152/78 | HR 78

## 2021-10-20 DIAGNOSIS — I10 Essential (primary) hypertension: Secondary | ICD-10-CM

## 2021-10-20 NOTE — Progress Notes (Signed)
Patient here today for BP check.      Last BP was on 10/17/2021 and was 219/88.  BP today is 152/78 with a pulse of 78.    Checked BP in left arm with large cuff.    Symptoms present: None  Patient last took Losartan last night and Amlodipine this morning.   Routed note to PCP.

## 2021-10-23 NOTE — Progress Notes (Deleted)
    SUBJECTIVE:   CHIEF COMPLAINT / HPI:   Hypertension: At last appointment patient's blood pressure was significantly elevated at 219/88 in the setting of running out of her medications.  These medications were refilled and she presented to our clinic for blood pressure check with our nurses on 10/20/2021 with a blood pressure 152/78.  Current medications include amlodipine 10, losartan 50. Today she states***.  PERTINENT  PMH / PSH: ***Hypertension  OBJECTIVE:   LMP 06/24/2018  ***  General: NAD, pleasant, able to participate in exam Respiratory: No respiratory distress Neuro: alert, no obvious focal deficits Psych: Normal affect and mood  ASSESSMENT/PLAN:    Hypertension: BP today of ***. Current meds include amlodipine 10 mg, losartan 50 mg.  Will increase losartan to 100 mg***.  We will have patient return in 1 week for BP check and for labs.  [name], [place]    {    This will disappear when note is signed, click to select method of visit    :1}

## 2021-10-24 ENCOUNTER — Ambulatory Visit: Payer: Medicaid Other | Admitting: Family Medicine

## 2021-10-25 ENCOUNTER — Telehealth: Payer: Self-pay | Admitting: Licensed Clinical Social Worker

## 2021-10-25 ENCOUNTER — Telehealth: Payer: Medicaid Other | Admitting: Licensed Clinical Social Worker

## 2021-10-25 NOTE — Telephone Encounter (Signed)
  Care Management   Follow Up Note   10/25/2021 Name: Susan Sanders MRN: [id number] DOB: 03-23-1971   Referred by: [name], DO Reason for referral : No chief complaint on file.   An unsuccessful telephone outreach was attempted today. The patient was referred to the case management team for assistance with care management and care coordination.   Follow Up Plan: The care management team will reach out to the patient again over the next 30 days.   Lenor Derrick, MSW  Social Worker [facility] Care Management  [phone]

## 2021-11-28 ENCOUNTER — Ambulatory Visit: Payer: Self-pay | Admitting: Licensed Clinical Social Worker

## 2021-11-28 NOTE — Patient Instructions (Signed)
Visit Information  Instructions: patient will work with SW to address concerns related to Lack of appetite  Patient was given the following information about care management and care coordination services today, agreed to services, and gave verbal consent: 1.care management/care coordination services include personalized support from designated clinical staff supervised by their physician, including individualized plan of care and coordination with other care providers 2. 24/7 contact phone numbers for assistance for urgent and routine care needs. 3. The patient may stop care management/care coordination services at any time by phone call to the office staff.  Patient verbalizes understanding of instructions and care plan provided today and agrees to view in [place]. Active MyChart status and patient understanding of how to access instructions and care plan via MyChart confirmed with patient.     Telephone follow up appointment with care management team member scheduled for:08/15 at 53 Pm.  Lenor Derrick, MSW  Social Worker [facility] Care Management  [phone]

## 2021-11-28 NOTE — Patient Outreach (Addendum)
  Care Coordination   Initial Visit Note   11/28/2021 Name: Susan Sanders MRN: [id number] DOB: Jul 26, 1970  Susan Sanders is a 51 y.o. year old female who sees Lowry Ram, MD for primary care. I spoke with  Kristin Bruins by phone today  What matters to the patients health and wellness today?  Lack of appetite.    Goals Addressed   None     SDOH assessments and interventions completed:   Yes   Care Coordination Interventions Activated:  Yes Care Coordination Interventions:   Yes, provided  Follow up plan: Follow up call scheduled for 12/26/21 at 9 am  Patient requested outreach from [facility] RN or dietician to discuss medication and lack of appetite. SW informed RNCM of request. SW sent referral to [facility] pharmacist to assist.  Encounter Outcome:  Pt. Visit Completed  Lenor Derrick, MSW  Social Worker [facility] Care Management  [phone]

## 2021-12-04 ENCOUNTER — Telehealth: Payer: Self-pay | Admitting: Family Medicine

## 2021-12-04 NOTE — Telephone Encounter (Signed)
-----   Message from Vista Deck, CPhT sent at 11/29/2021  3:21 PM EDT -----  ----- Message ----- From: Drema Pry Sent: 11/28/2021   3:55 PM EDT To: Vista Deck, CPhT  Hi, Camille!  This patient is complaining of lack of appetite and believes it's due to her current medications. Could you reach out to her? Maybe to discuss when and what she is taking her medications with. Maybe her method is increasing her lack of appetite.   Thank you!!

## 2021-12-04 NOTE — Telephone Encounter (Signed)
Called patient at [phone] twice with no answer. Left a voice message to call back.

## 2021-12-26 ENCOUNTER — Encounter: Payer: Self-pay | Admitting: Licensed Clinical Social Worker

## 2021-12-26 ENCOUNTER — Ambulatory Visit: Payer: Self-pay | Admitting: Licensed Clinical Social Worker

## 2021-12-26 NOTE — Patient Outreach (Signed)
  [place] [facility]) Care Management  [facility] Social Work  12/26/2021  Annali Lybrand [facility] 06-Jun-1970 [id number]  Subjective:  Chart review and Follow up  Objective: Unsuccessful outreach to patient. SW reviewed chart.  Encounter Medications:  Outpatient Encounter Medications as of 12/26/2021  Medication Sig   acetaminophen (TYLENOL) 325 MG tablet Take 650 mg by mouth every 6 (six) hours as needed.   amLODipine (NORVASC) 10 MG tablet Take 1 tablet (10 mg total) by mouth at bedtime.   cetirizine (ZYRTEC) 10 MG tablet Take 1 tablet (10 mg total) by mouth daily.   ferrous sulfate 325 (65 FE) MG tablet Take 325 mg by mouth daily with breakfast.    ibuprofen (ADVIL) 800 MG tablet Take 1 tablet (800 mg total) by mouth 3 (three) times daily.   losartan (COZAAR) 25 MG tablet Take 2 tablets (50 mg total) by mouth at bedtime.   methocarbamol (ROBAXIN) 500 MG tablet Take 1 tablet (500 mg total) by mouth 2 (two) times daily.   MULTIPLE VITAMINS PO Take by mouth.   predniSONE (DELTASONE) 20 MG tablet 3 tabs po day one, then 2 po daily x 4 days   No facility-administered encounter medications on file as of 12/26/2021.    Functional Status:      No data to display          Fall/Depression Screening:     10/17/2021    3:11 PM 04/26/2021    2:59 PM 10/21/2020    9:20 AM 10/13/2020    9:19 AM 09/22/2020    9:31 AM 09/12/2020    2:54 PM 01/04/2020    9:09 AM  PHQ 2/9 Scores  PHQ - 2 Score 0 0 0 0 0 0 0  PHQ- 9 Score 2 0 0 0 1 0 2    Assessment:  Care Plan There are no care plans that you recently modified to display for this patient.    Goals Addressed             This Visit's Progress    MSW Care Coordination       SW reviewed chart. Last visit, patient described having a low appetite. SW following up to see if anything additional is needed. SW will attempt patient again.        Plan:  Follow-up:  Follow-up in 75 day(s)  Lenor Derrick , MSW Social Worker [facility]  Care Management  [phone]

## 2022-02-26 ENCOUNTER — Ambulatory Visit (INDEPENDENT_AMBULATORY_CARE_PROVIDER_SITE_OTHER): Payer: Self-pay | Admitting: Family Medicine

## 2022-02-26 ENCOUNTER — Encounter: Payer: Self-pay | Admitting: Family Medicine

## 2022-02-26 VITALS — BP 165/90 | HR 89 | Temp 98.2°F | Wt 213.4 lb

## 2022-02-26 DIAGNOSIS — E669 Obesity, unspecified: Secondary | ICD-10-CM

## 2022-02-26 DIAGNOSIS — I10 Essential (primary) hypertension: Secondary | ICD-10-CM

## 2022-02-26 DIAGNOSIS — Z6835 Body mass index (BMI) 35.0-35.9, adult: Secondary | ICD-10-CM

## 2022-02-26 DIAGNOSIS — M6740 Ganglion, unspecified site: Secondary | ICD-10-CM

## 2022-02-26 MED ORDER — LOSARTAN POTASSIUM-HCTZ 50-12.5 MG PO TABS: 1.0000 | ORAL_TABLET | Freq: Every day | ORAL | 0 refills | Status: DC

## 2022-02-26 NOTE — Patient Instructions (Signed)
It was great seeing you today!  You came in to discuss weight loss and the medication we discussed wont be covered with your insurance now but hopefully when medicaid expands later this year you will be eligible and we can try to prescribe it then.   I have which her blood pressure medication to the combo pill that we discussed which is losartan and hydrochlorothiazide.  Continue taking the amlodipine as well  I have placed a referral to orthopedic surgery for ganglion cyst removal and they will call you to schedule the appointment   Feel free to call with any questions or concerns at any time, at [phone].   Take care,  Dr. Shary Key [facility]   Therapy and Counseling Resources Most providers on this list will take Medicaid. Patients with commercial insurance or Medicare should contact their insurance company to get a list of in network providers.  [employer] (takes children) Location 1: [address], [place], [place] [address] Location 2: [place], [place] [address] [place] ([place] speaking therapist available)(habla espanol)(take medicare and medicaid)  [place], [place], [place] [address], [place] [email or web address] [phone]  BestDay:Psychiatry and Counseling [address] [place], [place] [address] [place], [place], [place] [address]      [phone]  [place] (spanish available) [place], [place] [address] [place] (take [facility] and medicare) [address], [place], [place] [address]       [phone]     [place] (virtual only) [phone]  Jinny Blossom [facility] [address], [place], [place]  Family Solutions:  [place]. [place]  [phone]  Journeys Counseling:  [place] [address] Rosie Fate [phone]  [facility] (under & uninsured) [address] [place] [phone]    [email or web address]    [place] [phone] [address]  [place]    [phone]  [facility] [place] [facility]     Phone:  [phone]     Altus Osgood  [place] [address]      [place], [place] ext [place]: [place], [place], [place]   [place] ([place] therapist) [email or web address]  [place] [address]   [place] [address]    [phone]    [facility]   [address],  [place], [place]   [place]  [place]  [facility]  [address] [place], [place]        [phone]  Open Access/Walk In Clinic under & uninsured  [facility]  [address] [place], [place] Crisis [phone]  Family Service of the [place],  ([place])   [place], [place]: [phone]) 8:30 - 12; 1 - 2:30  Family Service of the [place],  [place], [place]    [phone]):8:30 - 12; 2 - 3PM  [facility],  [address],  [place]; [phone]):   Mon - Fri 8 AM - 5  PM  Alcohol [facility] Drug Services [place]  [place] 12:30 to 3:00 or call to schedule an appointment  [phone]  Specific Provider options Psychology Today  [email or web address] click on find a therapist  enter your zip code left side and select or tailor a therapist for your specific need.   [facility] Provider Directory [email or web address]  (Medicaid)   Follow all drop  down to find a provider  [place] or [email or web address] [address], [place], [place] Recovery support and educational   24- Hour Availability:   [facility]  [address] [place], [place] Crisis [phone]  Family Service of the [employer] [phone]  [place]  [phone]   [place]  [phone] (after hours)  Therapeutic Alternative/Mobile Crisis   [phone]  [place] National Suicide Hotline  [phone] Diamantina Monks)  Call 911 or go to emergency room  [facility]  [phone]);  [place] and [employer]  [phone]); [place], [place], [place], [place], [place], [place], [place]

## 2022-02-26 NOTE — Progress Notes (Unsigned)
    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 Has been taking tylenol, ibuprofen and has tried ice and heat   Worried about weight gain- has been doing exercises, joined a gym about a month ago.   Weight loss, blood pressure   HTN BP: 165/90  Amlodipine '10mg'$ , Losartan '50mg'$ . Has been taking them regularly  Has had a lot of stressors lately. Mom passed recently, dad recently hospitalized   With her increased stress she has been eating a lot more. Has been drinking more water. Cut out sodas. Bakes her foods, cut out fried food. Eating more vegetables, smoothie drinks. Tries protein shakes    PERTINENT  PMH / PSH: ***  OBJECTIVE:   BP (!) 165/90   Pulse 89   Temp 98.2 F (36.8 C)   Wt 213 lb 6.4 oz (96.8 kg)   LMP 06/24/2018   SpO2 99%   BMI 35.51 kg/m    Physical exam General: well appearing, NAD Cardiovascular: RRR, no murmurs Lungs: CTAB. Normal WOB Abdomen: soft, non-distended, non-tender Skin: warm, dry. No edema  ASSESSMENT/PLAN:   No problem-specific Assessment & Plan notes found for this encounter.    Ganglion cyst    [name]

## 2022-02-28 DIAGNOSIS — M6740 Ganglion, unspecified site: Secondary | ICD-10-CM | POA: Insufficient documentation

## 2022-02-28 DIAGNOSIS — E669 Obesity, unspecified: Secondary | ICD-10-CM | POA: Insufficient documentation

## 2022-02-28 NOTE — Assessment & Plan Note (Signed)
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 Placed referral to orthopedic surgery for removal

## 2022-02-28 NOTE — Assessment & Plan Note (Signed)
BP 165/90. Currently on Amlodipine '10mg'$ , Losartan '50mg'$ . Switched Losartan to combo pill Losartan-HCTZ 50-12.5 and continue Amlodipine.

## 2022-02-28 NOTE — Assessment & Plan Note (Signed)
BMI 35.51. Discussed nutrition and exercise. I suspect stress is greatly contributing to this so dd provide list of therapists as well. Given her insurance a GLP1 would not be covered but will continue lifestyle changes and revisit this. If patient later agreeable to dietician referral, would be beneficial as well

## 2022-03-28 ENCOUNTER — Ambulatory Visit (INDEPENDENT_AMBULATORY_CARE_PROVIDER_SITE_OTHER): Payer: Self-pay | Admitting: Family Medicine

## 2022-03-28 VITALS — BP 162/94 | HR 87 | Wt 207.6 lb

## 2022-03-28 DIAGNOSIS — F32A Depression, unspecified: Secondary | ICD-10-CM

## 2022-03-28 DIAGNOSIS — I10 Essential (primary) hypertension: Secondary | ICD-10-CM

## 2022-03-28 DIAGNOSIS — F419 Anxiety disorder, unspecified: Secondary | ICD-10-CM

## 2022-03-28 DIAGNOSIS — F4321 Adjustment disorder with depressed mood: Secondary | ICD-10-CM | POA: Insufficient documentation

## 2022-03-28 MED ORDER — LOSARTAN POTASSIUM-HCTZ 50-12.5 MG PO TABS: 1.0000 | ORAL_TABLET | Freq: Every day | ORAL | 1 refills | Status: DC

## 2022-03-28 MED ORDER — SERTRALINE HCL 50 MG PO TABS: 50.0000 mg | ORAL_TABLET | Freq: Every day | ORAL | 3 refills | Status: DC

## 2022-03-28 MED ORDER — HYDROXYZINE PAMOATE 25 MG PO CAPS: 25.0000 mg | ORAL_CAPSULE | Freq: Three times a day (TID) | ORAL | 1 refills | Status: DC | PRN

## 2022-03-28 NOTE — Assessment & Plan Note (Deleted)
Continues to grieve the loss of her mother.  This has significantly affected her day to day life. She seems to have a component of anxiety and depression with this as well. PHQ-9 score of 11 and GAD-7 score of 6. No SI. She would benefit from both medications and

## 2022-03-28 NOTE — Assessment & Plan Note (Signed)
Continues to grieve the loss of her mother.  This has significantly affected her day to day life. She seems to have a component of anxiety and depression with this as well. PHQ-9 score of 11 and GAD-7 score of 6. No SI. She would benefit from both medications and grief counseling -Start Zoloft 50 mg daily - Start hydroxyzine 25 mg 3 times daily as needed for anxiety -Encouraged her to reach out to Ortho or care for grief counseling -Also provided with Medicaid counseling/therapy resources -F/u scheduled with Dr. Andria Frames who knows her well as he was PCP for her mother

## 2022-03-28 NOTE — Patient Instructions (Addendum)
It was wonderful to see you today. I am so sorry for your loss.   Today we talked about:  I am starting you on a medication called Sertraline. Take it once a day. If it makes you sleepy, take it at night. If it seems to wake you up, take it in the morning. I have also sent a medication called Hydroxyzine which you can take as needed up to 3 times a day for anxiety. I would encourage you to reach out to [facility] for grief counseling.  I have also attached some resources for therapy/counseling below.  Please follow up with Dr. Andria Frames on 12/1 as scheduled.   We are checking lab work today to check your kidney function and electrolytes. I have refilled your blood pressure medications.   Thank you for coming to your visit as scheduled. We have had a large "no-show" problem lately, and this significantly limits our ability to see and care for patients. As a friendly reminder- if you cannot make your appointment please call to cancel. We do have a no show policy for those who do not cancel within 24 hours. Our policy is that if you miss or fail to cancel an appointment within 24 hours, 3 times in a 37-monthperiod, you may be dismissed from our clinic.   Thank you for choosing [facility]   Please call [phone] any questions about today's appointment.  Please be sure to schedule follow up at the front  desk before you leave today.   ASharion Settler DO PGY-3 Family Medicine     Therapy and Counseling Resources Most providers on this list will take Medicaid. Patients with commercial insurance or Medicare should contact their insurance company to get a list of in network providers.  [employer](takes children) Location 1: [address] [address]Location 2: [address] [id number](sVale Summitspeaking therapist available)(habla espanol)(take medicare and medicaid)  2[place] [address] [place] [id number] [email or web address] [phone] BestDay:Psychiatry and Counseling [address] [place] [phone] [address]     [phone] [place](spanish available) 1[place] [id number](take [facility] medicare) [address] [place]oung  [address]6767      [phone]    MHaxtun(virtual only) [phone] EJinny BlossomTotal Access Care [address] GEmery [place] Family Solutions:  2[phone]  Journeys Counseling:  3GarrisonSTE ARosie [phone] KKershawhealth(under & uninsured) [address] [phone]    [email or web address]    LManistee Lake[address]  GLady Gary   [phone] [facility]    Phone:  [phone]    HEnglewoodMGarcon Point [address]      GBlackhawk NRogue Riverext 1[place] 2[place]ona GSouth Mountain NIndependence  SHardin(SMaranatherapist) [email or web address]  [address]   GEast [place]26[address]46  [place],  [place], [place]   [place]  [place]  [facility]  [address] [place], [place]        [phone]  Open Access/Walk In Clinic under & uninsured  [facility]  [address] [place], [place] Crisis [phone]  Family Service of the [place],  ([place])    [place], [place]: [phone]) 8:30 - 12; 1 - 2:30  Family Service of the [place],  [place], [place]    ([phone]):8:30 - 12; 2 - 3PM  [facility],  [address],  [place]; [phone]):   Mon - Fri 8 AM - 5 PM  Alcohol & Drug Services [place]  MWF 12:30 to 3:00 or call to schedule an appointment  [phone]  Specific Provider options Psychology Today  [email or web address] click on find a therapist  enter your zip code left side and select or tailor a therapist for your specific need.   [facility] Provider Directory [email or web address]  (Medicaid)   Follow all drop down to find a provider  [place] or [email or web address] [address], [place], [place] Recovery support and educational   24- Hour Availability:   [facility]  [address] [place], [place] Crisis [phone]  Family Service of the [employer] [phone]  [place]  [phone]   [place]  [phone] (after hours)  Therapeutic Alternative/Mobile Crisis   [phone]  [place] National Suicide Hotline  [phone] Diamantina Monks)  Call 911 or go to emergency room  [facility]  [phone]);  [place] and [employer]  [phone]); [place], [place], [place], [place], [place], [place], [place]  If you are feeling suicidal or depression symptoms worsen please immediately go to:   If you are thinking about harming yourself or having thoughts of suicide, or if you know someone who is, seek help right away. If you are in crisis, make sure you are not left alone.  If someone else is in crisis, make sure he/she/they is not left alone  Call 988 OR 1-800-273-TALK  24 Hour Availability for [place]  [address] [place], [place] Crisis [phone]    Other crisis resources:  Family Service of the [employer] (Domestic Violence, Rape & Victim Assistance [phone]  [facility] [place]    (ONLY from 8am-4pm)    [phone]  Therapeutic Alternative Mobile Crisis Unit (24/7)   [phone]  [place] National Suicide Hotline   [phone] Diamantina Monks)

## 2022-03-28 NOTE — Progress Notes (Signed)
SUBJECTIVE:   CHIEF COMPLAINT / HPI:   Susan Sanders is a 51 y.o. female who presents to the [facility] clinic today to discuss the following concerns:   Grief  Ms. Susan Sanders is understandably still grieving the loss of her mother who passed away in 04-Jan-2023 after battling metastatic bladder cancer.  She tells me that she has recurrent panic attacks daily.  She did not feel well enough to go to work today if she completely broke down.  She really misses her mother and feels lonely without her.  She is distanced herself from other people.  She is no longer going discharge.  She does not sleep well.  She has not been eating normally. She has no thoughts of self-harm or suicide and finds pleasure in her grandchildren still. She was very involved in her mother's care.  She also notes a strong relationship with her stepfather but he is also ill and was recently hospitalized.  She is afraid that she will lose him as well. She tells me that her mother asked her to be strong and continue to care for her step father, which she has been. She also tells me that her mother said she was ready to go be with the Susan Sanders. She does find some comfort in knowing that she is no longer suffering or in pain.   HTN She was last seen on 10/16.  She was switched to a combination antihypertensive of losartan-HCTZ 50-12.5 mg, and advised to continue her amlodipine 10 mg at bedtime.  Her blood pressure was elevated at prior visit but this was thought to also be exacerbated due to her recent stressors.  PERTINENT  PMH / PSH: Hypertension, anxiety depression  OBJECTIVE:   BP (!) 162/94   Pulse 87   Wt 207 lb 9.6 oz (94.2 kg)   LMP 06/24/2018   SpO2 98%   BMI 34.55 kg/m    General: Sad and crying  Respiratory: Tachypnea  Psych:  Depressed affect and mood     03/28/2022    9:40 AM 10/17/2021    3:11 PM 04/26/2021    2:59 PM  Depression screen PHQ 2/9  Decreased Interest 1 0 0  Down, Depressed, Hopeless 3 0 0  PHQ - 2  Score 4 0 0  Altered sleeping 3 1 0  Tired, decreased energy 3 1 0  Change in appetite 0 0 0  Feeling bad or failure about yourself  1 0 0  Trouble concentrating 0 0 0  Moving slowly or fidgety/restless 0 0 0  Suicidal thoughts 0 0 0  PHQ-9 Score 11 2 0  Difficult doing work/chores Somewhat difficult Not difficult at all       03/28/2022   10:16 AM 06/03/2018    3:00 PM  GAD 7 : Generalized Anxiety Score  Nervous, Anxious, on Edge 1 3  Control/stop worrying 1 3  Worry too much - different things 1 1  Trouble relaxing 1 2  Restless 0 3  Easily annoyed or irritable 1 3  Afraid - awful might happen 1 3  Total GAD 7 Score 6 18  Anxiety Difficulty Not difficult at all Somewhat difficult   ASSESSMENT/PLAN:   Anxiety and depression Continues to grieve the loss of her mother.  This has significantly affected her day to day life. She seems to have a component of anxiety and depression with this as well. PHQ-9 score of 11 and GAD-7 score of 6. No SI. She would benefit from  both medications and grief counseling -Start Zoloft 50 mg daily - Start hydroxyzine 25 mg 3 times daily as needed for anxiety -Encouraged her to reach out to Ortho or care for grief counseling -Also provided with Medicaid counseling/therapy resources -F/u scheduled with Dr. Andria Frames who knows her well as he was PCP for her mother   Essential hypertension Elevated blood pressure today but in the setting of not taking her blood pressure medications and in the setting of panic attack. -Refilled her combination antihypertensive Losartan-HCTZ 50-12.5 mg -Continue Amlodipine 10 mg -We will check BMP today    Susan Sanders, [place]

## 2022-03-28 NOTE — Assessment & Plan Note (Signed)
Elevated blood pressure today but in the setting of not taking her blood pressure medications and in the setting of panic attack. -Refilled her combination antihypertensive Losartan-HCTZ 50-12.5 mg -Continue Amlodipine 10 mg -We will check BMP today

## 2022-03-29 LAB — BASIC METABOLIC PANEL
BUN/Creatinine Ratio: 9 (ref 9–23)
BUN: 7 mg/dL (ref 6–24)
CO2: 23 mmol/L (ref 20–29)
Calcium: 9.6 mg/dL (ref 8.7–10.2)
Chloride: 101 mmol/L (ref 96–106)
Creatinine, Ser: 0.78 mg/dL (ref 0.57–1.00)
Glucose: 93 mg/dL (ref 70–99)
Potassium: 4.4 mmol/L (ref 3.5–5.2)
Sodium: 139 mmol/L (ref 134–144)
eGFR: 91 mL/min/{1.73_m2} (ref >59)

## 2022-04-13 ENCOUNTER — Ambulatory Visit: Payer: Medicaid Other | Admitting: Family Medicine

## 2022-04-15 ENCOUNTER — Emergency Department (HOSPITAL_BASED_OUTPATIENT_CLINIC_OR_DEPARTMENT_OTHER)
Admission: EM | Admit: 2022-04-15 | Discharge: 2022-04-15 | Disposition: A | Discharge status: Home / Self Care | Payer: Medicaid Other | Attending: Emergency Medicine | Admitting: Emergency Medicine

## 2022-04-15 ENCOUNTER — Other Ambulatory Visit: Payer: Self-pay

## 2022-04-15 ENCOUNTER — Encounter (HOSPITAL_BASED_OUTPATIENT_CLINIC_OR_DEPARTMENT_OTHER): Payer: Self-pay | Admitting: Emergency Medicine

## 2022-04-15 DIAGNOSIS — I10 Essential (primary) hypertension: Secondary | ICD-10-CM | POA: Insufficient documentation

## 2022-04-15 DIAGNOSIS — U071 COVID-19: Secondary | ICD-10-CM | POA: Insufficient documentation

## 2022-04-15 DIAGNOSIS — Z91040 Latex allergy status: Secondary | ICD-10-CM | POA: Insufficient documentation

## 2022-04-15 DIAGNOSIS — Z79899 Other long term (current) drug therapy: Secondary | ICD-10-CM | POA: Insufficient documentation

## 2022-04-15 MED ORDER — NIRMATRELVIR/RITONAVIR (PAXLOVID)TABLET: 3.0000 | ORAL_TABLET | Freq: Two times a day (BID) | ORAL | 0 refills | Status: AC

## 2022-04-15 NOTE — ED Notes (Signed)
Dc instructions reviewed with patient. Patient voiced understanding. Dc with belongings.  °

## 2022-04-15 NOTE — ED Provider Notes (Signed)
[facility] EMERGENCY DEPT Provider Note   CSN: [id number] Arrival date & time: 04/15/22  2774     History  No chief complaint on file.   Susan Sanders is a 51 y.o. female.  HPI 51 year old female history of hypertension presents today with positive COVID test and symptoms consistent with COVID infection.  She states she began feeling unwell 2 days ago on Friday.  Yesterday she began having bodyaches, congestion and had a positive COVID test at work this morning.  She has not previously had COVID.  She has had vaccines but has not had her booster this fall.  She denies dyspnea or inability to tolerate fluids.  She is having headache and bodyaches.  She has taken acetaminophen at home for fever and chills.     Home Medications Prior to Admission medications   Medication Sig Start Date End Date Taking? Authorizing Provider  nirmatrelvir/ritonavir EUA (PAXLOVID) 20 x 150 MG & 10 x '100MG'$  TABS Take 3 tablets by mouth 2 (two) times daily for 5 days. Patient GFR is normal. Take nirmatrelvir (150 mg) two tablets twice daily for 5 days and ritonavir (100 mg) one tablet twice daily for 5 days. 04/15/22 04/20/22 Yes Pattricia Boss, MD  acetaminophen (TYLENOL) 325 MG tablet Take 650 mg by mouth every 6 (six) hours as needed. Patient not taking: Reported on 03/28/2022    [provider]  amLODipine (NORVASC) 10 MG tablet Take 1 tablet (10 mg total) by mouth at bedtime. Patient not taking: Reported on 03/28/2022 10/17/21   [name], DO  cetirizine (ZYRTEC) 10 MG tablet Take 1 tablet (10 mg total) by mouth daily. Patient not taking: Reported on 03/28/2022 08/25/19   Guadalupe Dawn, MD  ferrous sulfate 325 (65 FE) MG tablet Take 325 mg by mouth daily with breakfast.  Patient not taking: Reported on 03/28/2022    [provider]  hydrOXYzine (VISTARIL) 25 MG capsule Take 1 capsule (25 mg total) by mouth 3 (three) times daily as needed for anxiety. 03/28/22    Sharion Settler, DO  ibuprofen (ADVIL) 800 MG tablet Take 1 tablet (800 mg total) by mouth 3 (three) times daily. 05/10/21   Etta Quill, NP  losartan-hydrochlorothiazide (HYZAAR) 50-12.5 MG tablet Take 1 tablet by mouth daily. 03/28/22   Sharion Settler, DO  methocarbamol (ROBAXIN) 500 MG tablet Take 1 tablet (500 mg total) by mouth 2 (two) times daily. Patient not taking: Reported on 03/28/2022 11/11/20   Tedd Sias, PA  MULTIPLE VITAMINS PO Take by mouth.    [provider]  predniSONE (DELTASONE) 20 MG tablet 3 tabs po day one, then 2 po daily x 4 days Patient not taking: Reported on 03/28/2022 05/10/21   Etta Quill, NP  sertraline (ZOLOFT) 50 MG tablet Take 1 tablet (50 mg total) by mouth daily. 03/28/22   Sharion Settler, DO      Allergies    Latex, Amoxicillin, Naproxen, and Tramadol    Review of Systems   Review of Systems  Physical Exam Updated Vital Signs BP (!) 177/80 (BP Location: Left Arm)   Pulse 81   Temp 98.4 F (36.9 C)   Resp 18   LMP 06/24/2018   SpO2 100%  Physical Exam Vitals and nursing note reviewed.  Constitutional:      Appearance: Normal appearance.  HENT:     Head: Normocephalic.     Right Ear: External ear normal.     Left Ear: External ear normal.     Nose:  Nose normal.  Eyes:     Extraocular Movements: Extraocular movements intact.     Pupils: Pupils are equal, round, and reactive to light.  Cardiovascular:     Rate and Rhythm: Normal rate.     Heart sounds: Normal heart sounds.  Pulmonary:     Effort: Pulmonary effort is normal.     Breath sounds: Normal breath sounds.  Abdominal:     General: Bowel sounds are normal.     Palpations: Abdomen is soft.  Musculoskeletal:        General: Normal range of motion.     Cervical back: Normal range of motion.  Skin:    General: Skin is warm.     Capillary Refill: Capillary refill takes less than 2 seconds.  Neurological:     General: No focal deficit present.      Mental Status: She is alert.  Psychiatric:        Mood and Affect: Mood normal.        Behavior: Behavior normal.     ED Results / Procedures / Treatments   Labs (all labs ordered are listed, but only abnormal results are displayed) Labs Reviewed - No data to display  EKG None  Radiology No results found.  Procedures Procedures    Medications Ordered in ED Medications - No data to display  ED Course/ Medical Decision Making/ A&P                           Medical Decision Making 51 year old female presents today with COVID. Plan Paxlovid. Reviewed prior labs with normal renal function within the past month Reviewed interactions with Paxlovid and patient advised to not take her hydroxyzine while on course.  There is interaction with amlodipine which suggest monitoring therapy.  However, patient will be on short dose.  She has a given a work note.  We have discussed return precautions and need for follow-up and she voices understanding           Final Clinical Impression(s) / ED Diagnoses Final diagnoses:  COVID    Rx / DC Orders ED Discharge Orders          Ordered    nirmatrelvir/ritonavir EUA (PAXLOVID) 20 x 150 MG & 10 x '100MG'$  TABS  2 times daily        04/15/22 3818              Pattricia Boss, MD 04/15/22 [phone]

## 2022-04-15 NOTE — ED Triage Notes (Signed)
Pt started yesterday with headache/body ache/coughing, tested + for covid. Here for symptom relief.

## 2022-04-15 NOTE — Discharge Instructions (Signed)
Please drink plenty of fluids Do not take ibuprofen, hydroxyzine, while taking Paxlovid Recheck if you are worse especially if you are not tolerating fluids or unable control fever with acetaminophen.

## 2022-04-23 ENCOUNTER — Ambulatory Visit (HOSPITAL_BASED_OUTPATIENT_CLINIC_OR_DEPARTMENT_OTHER): Payer: Medicaid Other | Admitting: Orthopaedic Surgery

## 2022-05-14 DIAGNOSIS — Z419 Encounter for procedure for purposes other than remedying health state, unspecified: Secondary | ICD-10-CM | POA: Diagnosis not present

## 2022-06-14 DIAGNOSIS — Z419 Encounter for procedure for purposes other than remedying health state, unspecified: Secondary | ICD-10-CM | POA: Diagnosis not present

## 2022-06-18 ENCOUNTER — Ambulatory Visit (INDEPENDENT_AMBULATORY_CARE_PROVIDER_SITE_OTHER): Payer: Medicaid Other | Admitting: Family Medicine

## 2022-06-18 VITALS — BP 130/80 | HR 90 | Temp 98.2°F | Ht 65.0 in | Wt 210.0 lb

## 2022-06-18 DIAGNOSIS — R197 Diarrhea, unspecified: Secondary | ICD-10-CM

## 2022-06-18 DIAGNOSIS — G44209 Tension-type headache, unspecified, not intractable: Secondary | ICD-10-CM

## 2022-06-18 NOTE — Progress Notes (Signed)
SUBJECTIVE:   CHIEF COMPLAINT / HPI:   Susan Sanders is a 52 y.o. female who presents to the [facility] clinic today to discuss the following concerns:   Frontal Headaches Started last week on Friday. It is constant, feels like a pressure. She took her blood pressure pills and laid back in bed this morning. She also reports she took an extra amlodipine pill yesterday which seemed to help. States that her headache started to ease a little bit after this. She also notes some body aches and stiff shoulders. She feels tight in her shoulders. She placed a lidocaine patch to each shoulder and this has been helping. She has also tried Voltaren gel which is helping. No N/V. No vision changes such as blurred or double vision.   Diarrhea Started on Friday when she left work. 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 She did a COVID test which returned negative. She has been resting in bed all weekend. She has had more than 5 episodes of diarrhea each day. She got imodium which helped to ease it off. She does not feel like she is drinking enough water. No nausea or vomiting. No fevers. No other contacts at home with similar symptoms.    PERTINENT  PMH / PSH: HTN, trapezius muscle spasms, anxiety and depression  OBJECTIVE:   BP 130/80   Pulse 90   Temp 98.2 F (36.8 C)   Ht '5\' 5"'$  (1.651 m)   Wt 210 lb (95.3 kg)   LMP 06/24/2018   SpO2 100%   BMI 34.95 kg/m    General: NAD, pleasant, able to participate in exam. Respiratory: normal effort Abdomen: Bowel sounds present, non-tender in all quadrants, non-distended, obese abdomen Skin: warm and dry, normal skin turgor Cranial Nerves: II: Visual Fields are full. PERRL.  III,IV, VI: EOMI without ptosis or diplopia.  V: Facial sensation is symmetric to temperature VII: Facial movement is symmetric.  VIII: hearing is intact to voice X: Palate elevates symmetrically XI: Shoulder shrug is symmetric. XII: tongue is midline without atrophy or  fasciculations.  Motor: Tone is normal. Bulk is normal. 5/5 strength was present in all four extremities.  Sensory: Sensation is symmetric to light touch and temperature in the arms and legs. Cerebellar: FNF and HKS are intact bilaterally Psych: Normal affect and mood     06/18/2022   11:49 AM 03/28/2022    9:40 AM 10/17/2021    3:11 PM  Depression screen PHQ 2/9  Decreased Interest 0 1 0  Down, Depressed, Hopeless 0 3 0  PHQ - 2 Score 0 4 0  Altered sleeping 0 3 1  Tired, decreased energy 0 3 1  Change in appetite 0 0 0  Feeling bad or failure about yourself  0 1 0  Trouble concentrating 0 0 0  Moving slowly or fidgety/restless 0 0 0  Suicidal thoughts 0 0 0  PHQ-9 Score 0 11 2  Difficult doing work/chores Not difficult at all Somewhat difficult Not difficult at all   ASSESSMENT/PLAN:   1. Diarrhea of presumed infectious origin Ongoing x 4 days. Several loose stools daily. Without fever or abdominal tenderness on examination. Appears euvolemic. Suspect likely viral gastroenteritis.  - Encouraged frequent handwashing with soap and water - Encourage good hydration - Loperamide as needed up to 4 times daily for diarrhea - Return if persistent symptoms or not improving - Work note provided to return 2/7  2. Tension headache Normal neurological exam. Low likelihood of more  serious intracranial abnormality.  Likely a result of dehydration from GI losses. - Encouraged increased hydration - Continue Tylenol as needed headaches - Encouraged regular meals, adequate sleep, exercise  Encouraged patient to schedule f/u to discuss her other concern today which was her shoulder pain.   Sharion Settler, [place]

## 2022-06-18 NOTE — Patient Instructions (Addendum)
It was wonderful to see you today.  Please bring ALL of your medications with you to every visit.   Today we talked about:  I think you are having a tension headache.  Things you can do to help: -Increase your water intake -Eat regular meals -Improve sleep -Exercise -Decrease screen time.  You can take Tylenol as needed for your headaches but try not to take too often as you can develop medication overuse headaches.  Wash your hands with soap and water to prevent spread. You can take your imodium up to 4 times a day. Make sure to stay hydrated!  Return if your symptoms persist or don't improve.  Thank you for coming to your visit as scheduled. We have had a large "no-show" problem lately, and this significantly limits our ability to see and care for patients. As a friendly reminder- if you cannot make your appointment please call to cancel. We do have a no show policy for those who do not cancel within 24 hours. Our policy is that if you miss or fail to cancel an appointment within 24 hours, 3 times in a 81-monthperiod, you may be dismissed from our clinic.   Thank you for choosing [facility]   Please call [phone] any questions about today's appointment.  Please be sure to schedule follow up at the front  desk before you leave today.   ASharion Settler DO PGY-3 Family Medicine

## 2022-06-20 DIAGNOSIS — Z111 Encounter for screening for respiratory tuberculosis: Secondary | ICD-10-CM | POA: Diagnosis not present

## 2022-06-26 ENCOUNTER — Ambulatory Visit (INDEPENDENT_AMBULATORY_CARE_PROVIDER_SITE_OTHER): Payer: Medicaid Other

## 2022-06-26 DIAGNOSIS — Z111 Encounter for screening for respiratory tuberculosis: Secondary | ICD-10-CM | POA: Diagnosis not present

## 2022-06-26 NOTE — Progress Notes (Signed)
Patient presents to nurse clinic for PPD placement.  PPD placed in left forearm.  Patient to return Thursday afternoon to have site read.

## 2022-06-29 ENCOUNTER — Ambulatory Visit (INDEPENDENT_AMBULATORY_CARE_PROVIDER_SITE_OTHER): Payer: Medicaid Other

## 2022-06-29 DIAGNOSIS — Z111 Encounter for screening for respiratory tuberculosis: Secondary | ICD-10-CM

## 2022-06-29 LAB — TB SKIN TEST
Induration: 0 mm
TB Skin Test: NEGATIVE

## 2022-06-29 NOTE — Progress Notes (Signed)
Patient is here for a PPD read.  It was placed on 06/26/2022 in the left forearm @ 3:00 pm.    PPD RESULTS:  Result: negative Induration: 0 mm  Letter created and given to patient for documentation purposes.   Talbot Grumbling, RN

## 2022-07-02 NOTE — Progress Notes (Signed)
Negative (normal) TB test

## 2022-07-13 DIAGNOSIS — Z419 Encounter for procedure for purposes other than remedying health state, unspecified: Secondary | ICD-10-CM | POA: Diagnosis not present

## 2022-08-13 ENCOUNTER — Encounter (HOSPITAL_BASED_OUTPATIENT_CLINIC_OR_DEPARTMENT_OTHER): Payer: Self-pay | Admitting: Emergency Medicine

## 2022-08-13 ENCOUNTER — Other Ambulatory Visit: Payer: Self-pay

## 2022-08-13 ENCOUNTER — Emergency Department (HOSPITAL_BASED_OUTPATIENT_CLINIC_OR_DEPARTMENT_OTHER)
Admission: EM | Admit: 2022-08-13 | Discharge: 2022-08-13 | Disposition: A | Discharge status: Home / Self Care | Payer: Medicaid Other | Attending: Emergency Medicine | Admitting: Emergency Medicine

## 2022-08-13 ENCOUNTER — Other Ambulatory Visit (HOSPITAL_BASED_OUTPATIENT_CLINIC_OR_DEPARTMENT_OTHER): Payer: Self-pay

## 2022-08-13 DIAGNOSIS — K529 Noninfective gastroenteritis and colitis, unspecified: Secondary | ICD-10-CM

## 2022-08-13 DIAGNOSIS — Z79899 Other long term (current) drug therapy: Secondary | ICD-10-CM | POA: Insufficient documentation

## 2022-08-13 DIAGNOSIS — Z419 Encounter for procedure for purposes other than remedying health state, unspecified: Secondary | ICD-10-CM | POA: Diagnosis not present

## 2022-08-13 DIAGNOSIS — R112 Nausea with vomiting, unspecified: Secondary | ICD-10-CM | POA: Insufficient documentation

## 2022-08-13 DIAGNOSIS — I10 Essential (primary) hypertension: Secondary | ICD-10-CM | POA: Insufficient documentation

## 2022-08-13 DIAGNOSIS — Z91040 Latex allergy status: Secondary | ICD-10-CM | POA: Diagnosis not present

## 2022-08-13 DIAGNOSIS — R197 Diarrhea, unspecified: Secondary | ICD-10-CM | POA: Diagnosis not present

## 2022-08-13 DIAGNOSIS — R42 Dizziness and giddiness: Secondary | ICD-10-CM | POA: Diagnosis not present

## 2022-08-13 LAB — COMPREHENSIVE METABOLIC PANEL
ALT: 15 U/L (ref 0–44)
AST: 15 U/L (ref 15–41)
Albumin: 4.3 g/dL (ref 3.5–5.0)
Alkaline Phosphatase: 57 U/L (ref 38–126)
Anion gap: 10 (ref 5–15)
BUN: 16 mg/dL (ref 6–20)
CO2: 22 mmol/L (ref 22–32)
Calcium: 9.5 mg/dL (ref 8.9–10.3)
Chloride: 104 mmol/L (ref 98–111)
Creatinine, Ser: 0.92 mg/dL (ref 0.44–1.00)
GFR, Estimated: >60 mL/min (ref >60)
Glucose, Bld: 104 mg/dL — ABNORMAL HIGH (ref 70–99)
Potassium: 3.8 mmol/L (ref 3.5–5.1)
Sodium: 136 mmol/L (ref 135–145)
Total Bilirubin: 0.3 mg/dL (ref 0.3–1.2)
Total Protein: 7.3 g/dL (ref 6.5–8.1)

## 2022-08-13 LAB — CBC
HCT: 33.1 % — ABNORMAL LOW (ref 36.0–46.0)
Hemoglobin: 11 g/dL — ABNORMAL LOW (ref 12.0–15.0)
MCH: 26.4 pg (ref 26.0–34.0)
MCHC: 33.2 g/dL (ref 30.0–36.0)
MCV: 79.4 fL — ABNORMAL LOW (ref 80.0–100.0)
Platelets: 387 10*3/uL (ref 150–400)
RBC: 4.17 MIL/uL (ref 3.87–5.11)
RDW: 15.3 % (ref 11.5–15.5)
WBC: 8.9 10*3/uL (ref 4.0–10.5)
nRBC: 0 % (ref 0.0–0.2)

## 2022-08-13 LAB — LIPASE, BLOOD: Lipase: 32 U/L (ref 11–51)

## 2022-08-13 MED ORDER — SODIUM CHLORIDE 0.9 % IV BOLUS
1000.0000 mL | Freq: Once | INTRAVENOUS | Status: AC
  Administered 2022-08-13: 1000 mL via INTRAVENOUS

## 2022-08-13 MED ORDER — SODIUM CHLORIDE 0.9 % IV SOLN: INTRAVENOUS | Status: DC

## 2022-08-13 MED ORDER — LOPERAMIDE HCL 2 MG PO TABS
2.0000 mg | ORAL_TABLET | Freq: Four times a day (QID) | ORAL | 0 refills | Status: DC | PRN
  Filled 2022-08-13: qty 24, 6d supply, fill #0

## 2022-08-13 MED ORDER — ONDANSETRON 4 MG PO TBDP
4.0000 mg | ORAL_TABLET | Freq: Three times a day (TID) | ORAL | 0 refills | Status: DC | PRN
  Filled 2022-08-13: qty 12, 4d supply, fill #0

## 2022-08-13 MED ORDER — ONDANSETRON HCL 4 MG/2ML IJ SOLN
4.0000 mg | Freq: Once | INTRAMUSCULAR | Status: AC
  Administered 2022-08-13: 4 mg via INTRAVENOUS
  Filled 2022-08-13: qty 2

## 2022-08-13 NOTE — Discharge Instructions (Addendum)
Suspect viral gastroenteritis.  Labs today without significant abnormalities.  Take the Zofran dissolvable tablet as needed for nausea vomiting.  Take the Imodium for diarrhea.  Work note provided to be out of work for the next 2 days.  Return for any new or worse symptoms.

## 2022-08-13 NOTE — ED Provider Notes (Signed)
[facility] Provider Note   CSN: [id number] Arrival date & time: 08/13/22  [id number]     History  Chief Complaint  Patient presents with   Emesis   Diarrhea    Susan Sanders is a 52 y.o. female.  Patient with acute on sat of vomiting and diarrhea the past 2 days.  Was having vomiting at work this morning.  Associated with some mild abdominal discomfort.  But really none currently.  She has been a little lightheaded had some bodyaches and had a bit of headache.  Patient had a granddaughter that had similar symptoms.  Past medical history significant for anxiety hypertension depression chronic back pain.  Past surgical history significant for hysterectomy.  Former smoker quit in 2012.  Patient denies any blood in the vomit or diarrhea.       Home Medications Prior to Admission medications   Medication Sig Start Date End Date Taking? Authorizing Provider  acetaminophen (TYLENOL) 325 MG tablet Take 650 mg by mouth every 6 (six) hours as needed. Patient not taking: Reported on 03/28/2022    [provider]  amLODipine (NORVASC) 10 MG tablet Take 1 tablet (10 mg total) by mouth at bedtime. 10/17/21   [name], DO  cetirizine (ZYRTEC) 10 MG tablet Take 1 tablet (10 mg total) by mouth daily. Patient not taking: Reported on 03/28/2022 08/25/19   Guadalupe Dawn, MD  ferrous sulfate 325 (65 FE) MG tablet Take 325 mg by mouth daily with breakfast.    [provider]  hydrOXYzine (VISTARIL) 25 MG capsule Take 1 capsule (25 mg total) by mouth 3 (three) times daily as needed for anxiety. Patient not taking: Reported on 06/18/2022 03/28/22   Sharion Settler, DO  ibuprofen (ADVIL) 800 MG tablet Take 1 tablet (800 mg total) by mouth 3 (three) times daily. 05/10/21   Etta Quill, NP  losartan-hydrochlorothiazide (HYZAAR) 50-12.5 MG tablet Take 1 tablet by mouth daily. 03/28/22   Sharion Settler, DO  MULTIPLE VITAMINS PO Take by  mouth.    [provider]  sertraline (ZOLOFT) 50 MG tablet Take 1 tablet (50 mg total) by mouth daily. Patient not taking: Reported on 06/18/2022 03/28/22   Sharion Settler, DO      Allergies    Latex, Amoxicillin, Naproxen, and Tramadol    Review of Systems   Review of Systems  Constitutional:  Negative for chills and fever.  HENT:  Negative for ear pain and sore throat.   Eyes:  Negative for pain and visual disturbance.  Respiratory:  Negative for cough and shortness of breath.   Cardiovascular:  Negative for chest pain and palpitations.  Gastrointestinal:  Positive for diarrhea, nausea and vomiting. Negative for abdominal pain.  Genitourinary:  Negative for dysuria and hematuria.  Musculoskeletal:  Positive for myalgias. Negative for arthralgias and back pain.  Skin:  Negative for color change and rash.  Neurological:  Positive for light-headedness and headaches. Negative for seizures and syncope.  All other systems reviewed and are negative.   Physical Exam Updated Vital Signs BP (!) 170/76 (BP Location: Right Arm)   Pulse 78   Temp 98.4 F (36.9 C) (Oral)   Resp 16   Ht 1.651 m (5\' 5" )   Wt 98 kg   LMP 06/24/2018   SpO2 100%   BMI 35.94 kg/m  Physical Exam Vitals and nursing note reviewed.  Constitutional:      General: She is not in acute distress.    Appearance:  Normal appearance. She is well-developed.  HENT:     Head: Normocephalic and atraumatic.  Eyes:     Extraocular Movements: Extraocular movements intact.     Conjunctiva/sclera: Conjunctivae normal.     Pupils: Pupils are equal, round, and reactive to light.  Cardiovascular:     Rate and Rhythm: Normal rate and regular rhythm.     Heart sounds: No murmur heard. Pulmonary:     Effort: Pulmonary effort is normal. No respiratory distress.     Breath sounds: Normal breath sounds.  Abdominal:     General: There is no distension.     Palpations: Abdomen is soft.     Tenderness: There is no  abdominal tenderness. There is no guarding.  Musculoskeletal:        General: No swelling.     Cervical back: Normal range of motion and neck supple.  Skin:    General: Skin is warm and dry.     Capillary Refill: Capillary refill takes less than 2 seconds.  Neurological:     General: No focal deficit present.     Mental Status: She is alert and oriented to person, place, and time.     Cranial Nerves: No cranial nerve deficit.     Sensory: No sensory deficit.     Motor: No weakness.  Psychiatric:        Mood and Affect: Mood normal.     ED Results / Procedures / Treatments   Labs (all labs ordered are listed, but only abnormal results are displayed) Labs Reviewed  LIPASE, BLOOD  COMPREHENSIVE METABOLIC PANEL  CBC  URINALYSIS, ROUTINE W REFLEX MICROSCOPIC    EKG EKG Interpretation  Date/Time:  Monday August 13 2022 08:30:56 EDT Ventricular Rate:  78 PR Interval:  144 QRS Duration: 82 QT Interval:  378 QTC Calculation: 431 R Axis:   62 Text Interpretation: Sinus rhythm Confirmed by Fredia Sorrow [phone]) on 08/13/2022 8:32:56 AM  Radiology No results found.  Procedures Procedures    Medications Ordered in ED Medications - No data to display  ED Course/ Medical Decision Making/ A&P                             Medical Decision Making Amount and/or Complexity of Data Reviewed Labs: ordered.  Risk OTC drugs. Prescription drug management.   Patient symptoms seem to be consistent with a viral gastroenteritis.  Lab workup lipase normal complete metabolic panel normal including liver function test.  CBC no leukocytosis hemoglobin 11.  Patient feeling much better after IV fluids and Zofran.  Will discharge home with dissolvable Zofran and Imodium A-D and a work note. Final Clinical Impression(s) / ED Diagnoses Final diagnoses:  None    Rx / DC Orders ED Discharge Orders     None         Fredia Sorrow, MD 08/13/22 1059

## 2022-08-13 NOTE — ED Triage Notes (Signed)
Pt arrives to ED with c/o x2 days of diarrhea and vomiting associated with abdominal pain. She notes today she developed lightheadedness, headache.

## 2022-08-13 NOTE — ED Notes (Signed)
Pt in restroom 

## 2022-08-13 NOTE — ED Notes (Signed)
Pt ambulated with steady gait to restroom ?

## 2022-08-13 NOTE — ED Notes (Signed)
Pt discharged to home. Discharge instructions have been discussed with patient and/or family members. Pt verbally acknowledges understanding d/c instructions, and endorses comprehension to checkout at registration before leaving.  °

## 2022-08-23 ENCOUNTER — Other Ambulatory Visit: Payer: Self-pay

## 2022-08-23 ENCOUNTER — Ambulatory Visit (INDEPENDENT_AMBULATORY_CARE_PROVIDER_SITE_OTHER): Payer: Medicaid Other | Admitting: Student

## 2022-08-23 VITALS — BP 170/89 | HR 83 | Ht 65.0 in | Wt 215.0 lb

## 2022-08-23 DIAGNOSIS — M62838 Other muscle spasm: Secondary | ICD-10-CM

## 2022-08-23 MED ORDER — CYCLOBENZAPRINE HCL 10 MG PO TABS: 10.0000 mg | ORAL_TABLET | Freq: Two times a day (BID) | ORAL | 0 refills | Status: DC | PRN

## 2022-08-23 NOTE — Progress Notes (Signed)
  SUBJECTIVE:   CHIEF COMPLAINT / HPI:   Bilateral shoulder/neck pain Constantly trying to treat the pain with ice packs, lidocaine patches, Tylenol and Ibuprofen. Has been an issue for last 2 months, no hx of trauma or accidents. Heating pad helps take the tension away but pain returns when she stops. Pain feels like an ache that is constant, but at work when she is active and moving, it's better/doesn't bother her.  PERTINENT  PMH / PSH: HTN    Patient Care Team: Lockie Mola, MD as PCP - General (Family Medicine) Rich Reining as Social Worker OBJECTIVE:  BP (!) 170/89   Pulse 83   Ht 5\' 5"  (1.651 m)   Wt 215 lb (97.5 kg)   LMP 06/24/2018   SpO2 97%   BMI 35.78 kg/m  Physical Exam Musculoskeletal:     Right shoulder: Tenderness present. No swelling, deformity, effusion, bony tenderness or crepitus. Normal range of motion.     Left shoulder: Tenderness present. No swelling, deformity, effusion, bony tenderness or crepitus. Normal range of motion.     Cervical back: Spasms and tenderness present. No swelling, deformity, erythema, signs of trauma, rigidity, torticollis or bony tenderness. No pain with movement. Normal range of motion.  Neurological:     Mental Status: She is alert. Mental status is at baseline.     Cranial Nerves: Cranial nerves 2-12 are intact. No cranial nerve deficit or facial asymmetry.     Sensory: Sensation is intact. No sensory deficit.     Motor: Motor function is intact. No weakness.     Comments: Slight weakness with holding arms up against resistance, likely 2/2 pain as patient is wincing       ASSESSMENT/PLAN:  Trapezius muscle spasm Assessment & Plan: Patient presents for pain in neck/shoulders localized to traps. Pain is constant ache and at times not bothersome when she is very active at work. Has tried tylenol, ibuprofen, and lidocaine patches w/o relief, but note's heating pad helps for as long as she's using it. She notes intermittent  numbness/tingling in left hand infrequently. Neuro exam normal, strength testing in UE normal, although limited due to pain when tested against resistance. She has notable tension in her traps and feels relief when pressure applied to traps. Given exam and symptoms, most concerned for MSK origin, likely spasm/tension in traps as before. Also considered polymyalgia rheumatica, but less concerned w/ normal neuro exam and pain localized in traps, no stiffness and no systemic symptoms. Will consider testing if this continues to be an issue. Will also obtain imaging to rule out myelopathy/radiculopathy. -PT -Short course flexeril (5d BID prn) -DG cervical spine -F/u 2-4 wks if no relief  Orders: -     Ambulatory referral to Physical Therapy -     DG Cervical Spine Complete; Future  Other orders -     Cyclobenzaprine HCl; Take 1 tablet (10 mg total) by mouth 2 (two) times daily as needed for muscle spasms.  Dispense: 15 tablet; Refill: 0   No follow-ups on file. Bess Kinds, MD 08/24/2022, 1:24 PM PGY-2, [facility]

## 2022-08-23 NOTE — Patient Instructions (Addendum)
It was great to see you! Thank you for allowing me to participate in your care!  It looks like your trapezius muscles are tight/in spasm. This is likely causing your neck and shoulder pain, as the tension pulls on your neck and shoulders. We will recommend PT and short course of muscle relaxer  Our plans for today:  - Trapezius spasm  - PT for trapezius muscle  - Short course of muscle relaxer (Flexeril)   Very sedating, don't take and drive, or operate equipment. IF not working after 1-2 doses, stop taking  - X-Ray of neck (cervical spine)  - F/u in 2-4 weeks if not better, will consider getting more imaging   Take care and seek immediate care sooner if you develop any concerns.   Dr. Bess Kinds, MD [facility]

## 2022-08-24 NOTE — Assessment & Plan Note (Addendum)
Patient presents for pain in neck/shoulders localized to traps. Pain is constant ache and at times not bothersome when she is very active at work. Has tried tylenol, ibuprofen, and lidocaine patches w/o relief, but note's heating pad helps for as long as she's using it. She notes intermittent numbness/tingling in left hand infrequently. Neuro exam normal, strength testing in UE normal, although limited due to pain when tested against resistance. She has notable tension in her traps and feels relief when pressure applied to traps. Given exam and symptoms, most concerned for MSK origin, likely spasm/tension in traps as before. Also considered polymyalgia rheumatica, but less concerned w/ normal neuro exam and pain localized in traps, no stiffness and no systemic symptoms. Will consider testing if this continues to be an issue. Will also obtain imaging to rule out myelopathy/radiculopathy. -PT -Short course flexeril (5d BID prn) -DG cervical spine -F/u 2-4 wks if no relief

## 2022-09-04 ENCOUNTER — Other Ambulatory Visit: Payer: Self-pay

## 2022-09-04 ENCOUNTER — Emergency Department (HOSPITAL_BASED_OUTPATIENT_CLINIC_OR_DEPARTMENT_OTHER)
Admission: EM | Admit: 2022-09-04 | Discharge: 2022-09-04 | Disposition: A | Discharge status: Home / Self Care | Payer: Medicaid Other | Attending: Emergency Medicine | Admitting: Emergency Medicine

## 2022-09-04 ENCOUNTER — Emergency Department (HOSPITAL_BASED_OUTPATIENT_CLINIC_OR_DEPARTMENT_OTHER): Payer: Medicaid Other | Admitting: Radiology

## 2022-09-04 ENCOUNTER — Encounter (HOSPITAL_BASED_OUTPATIENT_CLINIC_OR_DEPARTMENT_OTHER): Payer: Self-pay | Admitting: Emergency Medicine

## 2022-09-04 DIAGNOSIS — R079 Chest pain, unspecified: Secondary | ICD-10-CM | POA: Diagnosis not present

## 2022-09-04 DIAGNOSIS — Z79899 Other long term (current) drug therapy: Secondary | ICD-10-CM | POA: Insufficient documentation

## 2022-09-04 DIAGNOSIS — I10 Essential (primary) hypertension: Secondary | ICD-10-CM | POA: Diagnosis not present

## 2022-09-04 DIAGNOSIS — M5431 Sciatica, right side: Secondary | ICD-10-CM | POA: Insufficient documentation

## 2022-09-04 DIAGNOSIS — M4126 Other idiopathic scoliosis, lumbar region: Secondary | ICD-10-CM | POA: Diagnosis not present

## 2022-09-04 DIAGNOSIS — M545 Low back pain, unspecified: Secondary | ICD-10-CM | POA: Diagnosis not present

## 2022-09-04 DIAGNOSIS — Z91040 Latex allergy status: Secondary | ICD-10-CM | POA: Diagnosis not present

## 2022-09-04 DIAGNOSIS — M25551 Pain in right hip: Secondary | ICD-10-CM | POA: Diagnosis not present

## 2022-09-04 DIAGNOSIS — M5441 Lumbago with sciatica, right side: Secondary | ICD-10-CM | POA: Diagnosis not present

## 2022-09-04 LAB — CBC
HCT: 37.4 % (ref 36.0–46.0)
Hemoglobin: 12.1 g/dL (ref 12.0–15.0)
MCH: 25.8 pg — ABNORMAL LOW (ref 26.0–34.0)
MCHC: 32.4 g/dL (ref 30.0–36.0)
MCV: 79.7 fL — ABNORMAL LOW (ref 80.0–100.0)
Platelets: 455 10*3/uL — ABNORMAL HIGH (ref 150–400)
RBC: 4.69 MIL/uL (ref 3.87–5.11)
RDW: 15.2 % (ref 11.5–15.5)
WBC: 6.9 10*3/uL (ref 4.0–10.5)
nRBC: 0 % (ref 0.0–0.2)

## 2022-09-04 LAB — BASIC METABOLIC PANEL
Anion gap: 9 (ref 5–15)
BUN: 12 mg/dL (ref 6–20)
CO2: 24 mmol/L (ref 22–32)
Calcium: 9.6 mg/dL (ref 8.9–10.3)
Chloride: 104 mmol/L (ref 98–111)
Creatinine, Ser: 0.76 mg/dL (ref 0.44–1.00)
GFR, Estimated: >60 mL/min (ref >60)
Glucose, Bld: 110 mg/dL — ABNORMAL HIGH (ref 70–99)
Potassium: 3.7 mmol/L (ref 3.5–5.1)
Sodium: 137 mmol/L (ref 135–145)

## 2022-09-04 LAB — TROPONIN I (HIGH SENSITIVITY): Troponin I (High Sensitivity): 3 ng/L (ref <18)

## 2022-09-04 MED ORDER — LIDOCAINE 5 % EX PTCH: 1.0000 | MEDICATED_PATCH | CUTANEOUS | 0 refills | Status: DC

## 2022-09-04 MED ORDER — LIDOCAINE 5 % EX PTCH
1.0000 | MEDICATED_PATCH | CUTANEOUS | Status: DC
  Administered 2022-09-04: 1 via TRANSDERMAL
  Filled 2022-09-04: qty 1

## 2022-09-04 MED ORDER — FENTANYL CITRATE PF 50 MCG/ML IJ SOSY
50.0000 ug | PREFILLED_SYRINGE | Freq: Once | INTRAMUSCULAR | Status: AC
  Administered 2022-09-04: 50 ug via INTRAVENOUS
  Filled 2022-09-04: qty 1

## 2022-09-04 MED ORDER — CYCLOBENZAPRINE HCL 5 MG PO TABS
5.0000 mg | ORAL_TABLET | Freq: Once | ORAL | Status: AC
  Administered 2022-09-04: 5 mg via ORAL
  Filled 2022-09-04: qty 1

## 2022-09-04 NOTE — Discharge Instructions (Addendum)
Please return to the ED with any new or worsening signs or symptoms Please read attached guide concerning sciatica and sciatica rehab Please follow-up with your PCP for further management of your low back pain Please begin utilizing Lidoderm patches on the right side of your low back for pain control.  If these are too expensive at the pharmacy, you may also purchase Salonpas patches.  Please continue taking Tylenol/ibuprofen every 6 hours as needed for pain.  Please also continue taking muscle relaxers that you have been prescribed.  Please be aware the muscle relaxers will cause you to become drowsy and sedated so do not take them and drive or operate heavy machinery Please see attached work note

## 2022-09-04 NOTE — ED Provider Notes (Signed)
[facility] Provider Note   CSN: [id number] Arrival date & time: 09/04/22  7253     History  Chief Complaint  Patient presents with   Hip Pain    Susan Sanders is a 52 y.o. female with medical history of anemia, anxiety, cellulitis, chronic back pain, depression, hypertension.  Patient presents to ED for evaluation of left-sided right back pain/hip pain.  Patient reports this began on Friday.  Patient denies any trauma or events to account for this pain.  The patient reports that the pain has been unrelenting since Friday, unrelieved by Tylenol and ibuprofen as well as lidocaine patches.  Patient states that going from a laying to a sitting position is excruciatingly painful and she will require assistance with this.  Patient denies any alleviating factors.  Patient denies any red flag symptoms of low back pain.  Patient denies history of the same pain.  Patient denies urinary symptoms, dysuria, history of kidney stones.  Patient reports that this morning when her daughter stated that she was going to call 911 for the patient's pain, the patient developed chest pain that was centralized and nonradiating.  Patient reports that she believes she is just "excited" and this is the reason for her chest pain.  The patient denies shortness of breath, lightheadedness, dizziness, weakness, syncope, leg swelling.   Hip Pain       Home Medications Prior to Admission medications   Medication Sig Start Date End Date Taking? Authorizing Provider  lidocaine (LIDODERM) 5 % Place 1 patch onto the skin daily. Remove & Discard patch within 12 hours or as directed by MD 09/04/22  Yes Al Decant, PA-C  amLODipine (NORVASC) 10 MG tablet Take 1 tablet (10 mg total) by mouth at bedtime. 10/17/21   Jackelyn Poling, DO  cetirizine (ZYRTEC) 10 MG tablet Take 1 tablet (10 mg total) by mouth daily. Patient not taking: Reported on 03/28/2022 08/25/19   Myrene Buddy, MD  cyclobenzaprine (FLEXERIL) 10 MG tablet Take 1 tablet (10 mg total) by mouth 2 (two) times daily as needed for muscle spasms. 08/23/22   Bess Kinds, MD  ferrous sulfate 325 (65 FE) MG tablet Take 325 mg by mouth daily with breakfast.    [provider]  hydrOXYzine (VISTARIL) 25 MG capsule Take 1 capsule (25 mg total) by mouth 3 (three) times daily as needed for anxiety. Patient not taking: Reported on 06/18/2022 03/28/22   Sabino Dick, DO  ibuprofen (ADVIL) 800 MG tablet Take 1 tablet (800 mg total) by mouth 3 (three) times daily. 05/10/21   Felicie Morn, NP  loperamide (IMODIUM A-D) 2 MG tablet Take 1 tablet (2 mg total) by mouth 4 (four) times daily as needed for diarrhea or loose stools. 08/13/22   Vanetta Mulders, MD  losartan-hydrochlorothiazide (HYZAAR) 50-12.5 MG tablet Take 1 tablet by mouth daily. 03/28/22   Sabino Dick, DO  MULTIPLE VITAMINS PO Take by mouth.    [provider]  ondansetron (ZOFRAN-ODT) 4 MG disintegrating tablet Take 1 tablet (4 mg total) by mouth every 8 (eight) hours as needed for nausea or vomiting. 08/13/22   Vanetta Mulders, MD  sertraline (ZOLOFT) 50 MG tablet Take 1 tablet (50 mg total) by mouth daily. Patient not taking: Reported on 06/18/2022 03/28/22   Sabino Dick, DO      Allergies    Latex, Amoxicillin, Naproxen, and Tramadol    Review of Systems   Review of Systems  Physical Exam Updated  Vital Signs BP (!) 173/73   Pulse 81   Temp 98.1 F (36.7 C) (Oral)   Resp 17   Ht  (1.651 m)   Wt 95.3 kg   LMP 06/24/2018   SpO2 98%   BMI 34.95 kg/m  Physical Exam  ED Results / Procedures / Treatments   Labs (all labs ordered are listed, but only abnormal results are displayed) Labs Reviewed  CBC - Abnormal; Notable for the following components:      Result Value   MCV 79.7 (*)    MCH 25.8 (*)    Platelets 455 (*)    All other components within normal limits  BASIC METABOLIC PANEL -  Abnormal; Notable for the following components:   Glucose, Bld 110 (*)    All other components within normal limits  TROPONIN I (HIGH SENSITIVITY)    EKG EKG Interpretation  Date/Time:  Tuesday September 04 2022 08:50:27 EDT Ventricular Rate:  81 PR Interval:  139 QRS Duration: 84 QT Interval:  374 QTC Calculation: 435 R Axis:   26 Text Interpretation: Sinus rhythm Similar to previous Confirmed by Coralee Pesa [phone]) on 09/04/2022 9:14:12 AM  Radiology DG Hip Unilat W or Wo Pelvis 2-3 Views Right  Result Date: 09/04/2022 CLINICAL DATA:  RIGHT-side low back pain radiating to RIGHT groin, no injury EXAM: DG HIP (WITH OR WITHOUT PELVIS) 2-3V RIGHT COMPARISON:  04/26/2012 FINDINGS: Osseous mineralization normal. Hip and SI joint spaces preserved. No acute fracture, dislocation, or bone destruction. IMPRESSION: No osseous abnormalities. Electronically Signed   By: Ulyses Southward M.D.   On: 09/04/2022 10:38   DG Chest 2 View  Result Date: 09/04/2022 CLINICAL DATA:  RIGHT-sided low back pain radiating to RIGHT groin, no injury EXAM: CHEST - 2 VIEW COMPARISON:  05/10/2021 FINDINGS: Normal heart size, mediastinal contours, and pulmonary vascularity. Lungs clear. No acute infiltrate, pleural effusion, or pneumothorax. Numerous EKG leads project over chest. Osseous structures unremarkable. IMPRESSION: Normal exam. Electronically Signed   By: Ulyses Southward M.D.   On: 09/04/2022 10:36   DG Lumbar Spine Complete  Result Date: 09/04/2022 CLINICAL DATA:  RIGHT-sided low back pain radiating to groin, no injury EXAM: LUMBAR SPINE - COMPLETE 4+ VIEW COMPARISON:  11/11/2020 FINDINGS: Five non-rib-bearing lumbar vertebra. Osseous demineralization. Levoconvex lumbar scoliosis. Vertebral body and disc space heights maintained. No fracture, subluxation, or bone destruction. Minor endplate spur formation L5-S1 and lower lumbar facet degenerative changes. No spondylolysis. SI joints preserved. 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 No acute abnormalities. Electronically Signed   By: Ulyses Southward M.D.   On: 09/04/2022 10:35    Procedures Procedures    Medications Ordered in ED Medications  lidocaine (LIDODERM) 5 % 1 patch (1 patch Transdermal Patch Applied 09/04/22 0934)  cyclobenzaprine (FLEXERIL) tablet 5 mg (5 mg Oral Given 09/04/22 0934)  fentaNYL (SUBLIMAZE) injection 50 mcg (50 mcg Intravenous Given 09/04/22 9604)    ED Course/ Medical Decision Making/ A&P  Medical Decision Making  52 year old female presents to ED for evaluation of right hip/low back pain.  Please see HPI for further details.  On examination the patient is afebrile nontachycardic.  Her lung sounds are clear bilaterally, she is not hypoxic.  Abdomen is soft and compressible throughout.  Neurological examination without focal neurodeficits.  Patient does have tenderness in the right lower back however has full range of motion of her right hip without pain.  CBC without leukocytosis or anemia.  BMP with electrolyte derangement or decreased kidney function.  Troponin 3, patient denies chest pain, reports that she thinks that it was secondary to her just "being excited" so will not collect delta.  EKG nonischemic and unchanged from prior.  Plain film images of patient right hip, lumbar spine, chest or unremarkable.  Patient provided 5 mg Flexeril, 50 mcg fentanyl, lidocaine patch.  On reassessment patient reports pain is completely gone.  Patient reports that she feels much better.  Most likely cause of patient right sided low back pain due to sciatica.  Patient will be advised to take ibuprofen/Tylenol at home.  Patient will be sent without lidocaine patches.  Patient encouraged to continue taking muscle relaxers that she has already been prescribed.  Patient will be written out of work for 2 days.  Return precautions provided and patient voiced understanding.   Patient had all of her questions answered to her satisfaction.  Patient stable for discharge.   Final Clinical Impression(s) / ED Diagnoses Final diagnoses:  Sciatica of right side    Rx / DC Orders ED Discharge Orders          Ordered    lidocaine (LIDODERM) 5 %  Every 24 hours        09/04/22 1116              Al Decant, PA-C 09/04/22 1118    Horton, Clabe Seal, DO 09/05/22 0715

## 2022-09-04 NOTE — ED Triage Notes (Signed)
Pt arrives pov, to triage in wheelchair with c/o RT hip pain radiating to RT leg and RT groin x5 days. Pt tearful in triage, yelling in pain. Pt assisted to stretcher. Ibuprofen 600 mg at 0700. Pt denies CP, endorses bilateral arm numbness x 1 week

## 2022-09-04 NOTE — ED Notes (Signed)
Pt given discharge instructions and reviewed prescriptions. Opportunities given for questions. Pt verbalizes understanding. PIV removed x1. Carold Eisner R, RN 

## 2022-09-05 ENCOUNTER — Telehealth: Payer: Self-pay

## 2022-09-05 ENCOUNTER — Other Ambulatory Visit: Payer: Medicaid Other

## 2022-09-05 NOTE — Progress Notes (Signed)
Attempted to contact patient regarding elevated BP reading during most recent ED visit. Per dispense report, patient has not filled losartan-HCTZ since 03/28/2022 (90 day supply) and not filled amlodipine since 06/06/2022 (90 day supply).  Unable to reach patient, phone call went immediately to voicemail. Left HIPAA compliant VM requesting a return call.   Valeda Malm, Pharm.D. PGY-2 Ambulatory Care Pharmacy Resident

## 2022-09-12 DIAGNOSIS — Z419 Encounter for procedure for purposes other than remedying health state, unspecified: Secondary | ICD-10-CM | POA: Diagnosis not present

## 2022-10-13 DIAGNOSIS — Z419 Encounter for procedure for purposes other than remedying health state, unspecified: Secondary | ICD-10-CM | POA: Diagnosis not present

## 2022-10-31 ENCOUNTER — Ambulatory Visit
Admission: RE | Admit: 2022-10-31 | Discharge: 2022-10-31 | Disposition: A | Discharge status: Home / Self Care | Payer: Medicaid Other | Source: Ambulatory Visit | Attending: Family Medicine | Admitting: Family Medicine

## 2022-10-31 DIAGNOSIS — M542 Cervicalgia: Secondary | ICD-10-CM | POA: Diagnosis not present

## 2022-10-31 DIAGNOSIS — M62838 Other muscle spasm: Secondary | ICD-10-CM

## 2022-11-06 ENCOUNTER — Encounter: Payer: Self-pay | Admitting: Student

## 2022-11-12 DIAGNOSIS — Z419 Encounter for procedure for purposes other than remedying health state, unspecified: Secondary | ICD-10-CM | POA: Diagnosis not present

## 2022-11-19 ENCOUNTER — Other Ambulatory Visit: Payer: Self-pay

## 2022-11-19 DIAGNOSIS — I10 Essential (primary) hypertension: Secondary | ICD-10-CM

## 2022-11-19 MED ORDER — LOSARTAN POTASSIUM-HCTZ 50-12.5 MG PO TABS: 1.0000 | ORAL_TABLET | Freq: Every day | ORAL | 1 refills | Status: DC

## 2022-11-19 MED ORDER — LIDOCAINE 5 % EX PTCH: 1.0000 | MEDICATED_PATCH | CUTANEOUS | 0 refills | Status: DC

## 2022-11-19 MED ORDER — AMLODIPINE BESYLATE 10 MG PO TABS: 10.0000 mg | ORAL_TABLET | Freq: Every day | ORAL | 3 refills | Status: DC

## 2022-11-19 NOTE — Telephone Encounter (Signed)
Patient calls nurse line requesting refills on BP medication.   She has been out of medication for about 2 days. She reports headache 8/10.  Headache started today. She has taken tylenol with little relief.   Denies visual changes, SHOB or chest pain.   Offered patient appointment this afternoon. Patient declines and states "my headache will go away when I get my BP medicine."   ED precautions discussed.   Veronda Prude, RN

## 2022-11-22 ENCOUNTER — Telehealth: Payer: Self-pay

## 2022-11-22 NOTE — Telephone Encounter (Signed)
Prior Auth for patients medication LIDOCAINE 5% PATCHES approved by [facility] MEDICAID from 11/22/22 to 11/22/23.  CoverMyMeds Key: [id number] PA Case ID #: [id number]

## 2022-11-22 NOTE — Telephone Encounter (Signed)
A Prior Authorization was initiated for this patients LIDOCAINE 5% PATCHES through CoverMyMeds.   Key: [id number]

## 2022-12-13 DIAGNOSIS — Z419 Encounter for procedure for purposes other than remedying health state, unspecified: Secondary | ICD-10-CM | POA: Diagnosis not present

## 2022-12-20 ENCOUNTER — Other Ambulatory Visit: Payer: Self-pay

## 2022-12-20 ENCOUNTER — Emergency Department (HOSPITAL_BASED_OUTPATIENT_CLINIC_OR_DEPARTMENT_OTHER)
Admission: EM | Admit: 2022-12-20 | Discharge: 2022-12-20 | Disposition: A | Discharge status: Home / Self Care | Payer: Medicaid Other | Attending: Emergency Medicine | Admitting: Emergency Medicine

## 2022-12-20 ENCOUNTER — Emergency Department (HOSPITAL_BASED_OUTPATIENT_CLINIC_OR_DEPARTMENT_OTHER): Payer: Medicaid Other

## 2022-12-20 ENCOUNTER — Encounter (HOSPITAL_BASED_OUTPATIENT_CLINIC_OR_DEPARTMENT_OTHER): Payer: Self-pay

## 2022-12-20 DIAGNOSIS — M5136 Other intervertebral disc degeneration, lumbar region: Secondary | ICD-10-CM | POA: Diagnosis not present

## 2022-12-20 DIAGNOSIS — I10 Essential (primary) hypertension: Secondary | ICD-10-CM | POA: Diagnosis not present

## 2022-12-20 DIAGNOSIS — K769 Liver disease, unspecified: Secondary | ICD-10-CM | POA: Diagnosis not present

## 2022-12-20 DIAGNOSIS — R932 Abnormal findings on diagnostic imaging of liver and biliary tract: Secondary | ICD-10-CM | POA: Diagnosis not present

## 2022-12-20 DIAGNOSIS — J45909 Unspecified asthma, uncomplicated: Secondary | ICD-10-CM | POA: Insufficient documentation

## 2022-12-20 DIAGNOSIS — M4807 Spinal stenosis, lumbosacral region: Secondary | ICD-10-CM | POA: Diagnosis not present

## 2022-12-20 DIAGNOSIS — M47817 Spondylosis without myelopathy or radiculopathy, lumbosacral region: Secondary | ICD-10-CM | POA: Diagnosis not present

## 2022-12-20 DIAGNOSIS — Z79899 Other long term (current) drug therapy: Secondary | ICD-10-CM | POA: Diagnosis not present

## 2022-12-20 DIAGNOSIS — Z87891 Personal history of nicotine dependence: Secondary | ICD-10-CM | POA: Insufficient documentation

## 2022-12-20 DIAGNOSIS — R1032 Left lower quadrant pain: Secondary | ICD-10-CM | POA: Diagnosis not present

## 2022-12-20 DIAGNOSIS — D1803 Hemangioma of intra-abdominal structures: Secondary | ICD-10-CM | POA: Diagnosis not present

## 2022-12-20 DIAGNOSIS — M545 Low back pain, unspecified: Secondary | ICD-10-CM | POA: Diagnosis not present

## 2022-12-20 DIAGNOSIS — R911 Solitary pulmonary nodule: Secondary | ICD-10-CM | POA: Insufficient documentation

## 2022-12-20 DIAGNOSIS — Q630 Accessory kidney: Secondary | ICD-10-CM | POA: Diagnosis not present

## 2022-12-20 LAB — CBC WITH DIFFERENTIAL/PLATELET
Abs Immature Granulocytes: 0.02 10*3/uL (ref 0.00–0.07)
Basophils Absolute: 0.1 10*3/uL (ref 0.0–0.1)
Basophils Relative: 1 %
Eosinophils Absolute: 0.2 10*3/uL (ref 0.0–0.5)
Eosinophils Relative: 2 %
HCT: 37.8 % (ref 36.0–46.0)
Hemoglobin: 12.2 g/dL (ref 12.0–15.0)
Immature Granulocytes: 0 %
Lymphocytes Relative: 42 %
Lymphs Abs: 3.7 10*3/uL (ref 0.7–4.0)
MCH: 25.7 pg — ABNORMAL LOW (ref 26.0–34.0)
MCHC: 32.3 g/dL (ref 30.0–36.0)
MCV: 79.6 fL — ABNORMAL LOW (ref 80.0–100.0)
Monocytes Absolute: 0.5 10*3/uL (ref 0.1–1.0)
Monocytes Relative: 6 %
Neutro Abs: 4.4 10*3/uL (ref 1.7–7.7)
Neutrophils Relative %: 49 %
Platelets: 412 10*3/uL — ABNORMAL HIGH (ref 150–400)
RBC: 4.75 MIL/uL (ref 3.87–5.11)
RDW: 15.4 % (ref 11.5–15.5)
WBC: 8.8 10*3/uL (ref 4.0–10.5)
nRBC: 0 % (ref 0.0–0.2)

## 2022-12-20 LAB — URINALYSIS, ROUTINE W REFLEX MICROSCOPIC
Bacteria, UA: NONE SEEN
Bilirubin Urine: NEGATIVE
Glucose, UA: NEGATIVE mg/dL
Leukocytes,Ua: NEGATIVE
Nitrite: NEGATIVE
Protein, ur: 30 mg/dL — AB
Specific Gravity, Urine: 1.036 — ABNORMAL HIGH (ref 1.005–1.030)
pH: 5.5 (ref 5.0–8.0)

## 2022-12-20 LAB — COMPREHENSIVE METABOLIC PANEL
ALT: 16 U/L (ref 0–44)
AST: 16 U/L (ref 15–41)
Albumin: 4.4 g/dL (ref 3.5–5.0)
Alkaline Phosphatase: 60 U/L (ref 38–126)
Anion gap: 6 (ref 5–15)
BUN: 16 mg/dL (ref 6–20)
CO2: 29 mmol/L (ref 22–32)
Calcium: 9.9 mg/dL (ref 8.9–10.3)
Chloride: 104 mmol/L (ref 98–111)
Creatinine, Ser: 0.91 mg/dL (ref 0.44–1.00)
GFR, Estimated: >60 mL/min (ref >60)
Glucose, Bld: 77 mg/dL (ref 70–99)
Potassium: 3.3 mmol/L — ABNORMAL LOW (ref 3.5–5.1)
Sodium: 139 mmol/L (ref 135–145)
Total Bilirubin: 0.2 mg/dL — ABNORMAL LOW (ref 0.3–1.2)
Total Protein: 7.3 g/dL (ref 6.5–8.1)

## 2022-12-20 LAB — PREGNANCY, URINE: Preg Test, Ur: NEGATIVE

## 2022-12-20 LAB — LIPASE, BLOOD: Lipase: 41 U/L (ref 11–51)

## 2022-12-20 MED ORDER — HYDROCODONE-ACETAMINOPHEN 5-325 MG PO TABS
1.0000 | ORAL_TABLET | Freq: Once | ORAL | Status: AC
  Administered 2022-12-20: 1 via ORAL
  Filled 2022-12-20: qty 1

## 2022-12-20 MED ORDER — ACETAMINOPHEN 325 MG PO TABS: 650.0000 mg | ORAL_TABLET | Freq: Four times a day (QID) | ORAL | 0 refills | Status: DC | PRN

## 2022-12-20 MED ORDER — SODIUM CHLORIDE 0.9 % IV BOLUS
1000.0000 mL | Freq: Once | INTRAVENOUS | Status: AC
  Administered 2022-12-20: 1000 mL via INTRAVENOUS

## 2022-12-20 MED ORDER — CYCLOBENZAPRINE HCL 10 MG PO TABS: 10.0000 mg | ORAL_TABLET | Freq: Two times a day (BID) | ORAL | 0 refills | Status: DC | PRN

## 2022-12-20 MED ORDER — IOHEXOL 300 MG/ML  SOLN
100.0000 mL | Freq: Once | INTRAMUSCULAR | Status: AC | PRN
  Administered 2022-12-20: 85 mL via INTRAVENOUS

## 2022-12-20 NOTE — ED Notes (Signed)
Discharge paperwork given and verbally understood. 

## 2022-12-20 NOTE — ED Triage Notes (Signed)
Patient arrives with complaints of worsening back pain x1 day. Patient also reports urinary frequency x1 week. Rates pain an 8/10

## 2022-12-20 NOTE — Discharge Instructions (Signed)
It was a pleasure caring for you today in the emergency department.  Please return to the emergency department for any worsening or worrisome symptoms.    Please follow-up with your Primary Care Physician within the next week. Please take your medications as instructed and discuss any changes to your medications with your primary care physician.    Please return to the Emergency Department if you have any leg numbness, leg weakness, difficulty walking, fevers, worsening of pain, lightheadedness, lose consciousness, severe abdominal pain, severe headache, difficulty urinating, or difficulty having a bowel movement.   Please return to the emergency department immediately for any new or concerning symptoms, or if you get worse. 

## 2022-12-20 NOTE — ED Provider Notes (Signed)
[place] EMERGENCY DEPARTMENT AT [facility] Provider Note  CSN: [id number] Arrival date & time: 12/20/22 1548  Chief Complaint(s) Urinary Frequency and Back Pain  HPI Ieasha Hnat is a 52 y.o. female with past medical history as below, significant for chronic back pain, HTN, obesity, anxiety who presents to the ED with complaint of flank pain/LLQ pain/ lumbar pain/urinary frequency.  Pt reports pain around 1 week. Left sided low back with radiation to her LLQ. Pain is more so paraspinal rather than along her midline. No dysuria or urgency but does report +frequency last 1-2 days. No abd vaginal bleeding or discharge. No n/v. No fevers. No trauma, no ivdu, no numbness/weakness to her legs or  gait change   Past Medical History Past Medical History:  Diagnosis Date   Allergy    Anemia    Anxiety    Cellulitis    Chest pain    Childhood asthma    Chronic back pain    "all over" (05/29/2018)   Complication of anesthesia    "difficult waking up " (03/30/2019)   Depression    Eczema    Hypertension    Scoliosis    Patient Active Problem List   Diagnosis Date Noted   Grief 03/28/2022   Obesity 02/28/2022   Ganglion cyst 02/28/2022   Trapezius muscle spasm 10/13/2020   Acute right-sided low back pain without sciatica 09/12/2020   Seasonal allergies 08/25/2019   Leiomyoma of body of uterus 07/21/2018   Generalized anxiety disorder 06/03/2018   Overweight 11/02/2016   Essential hypertension 07/14/2015   Cervical spondylolysis 06/18/2014   GERD (gastroesophageal reflux disease) 12/10/2013   Menorrhagia with regular cycle 03/20/2007   Anxiety and depression 01/23/2007   Home Medication(s) Prior to Admission medications   Medication Sig Start Date End Date Taking? Authorizing Provider  acetaminophen (TYLENOL) 325 MG tablet Take 2 tablets (650 mg total) by mouth every 6 (six) hours as needed. 12/20/22  Yes Tanda Rockers A, DO  cyclobenzaprine (FLEXERIL) 10 MG tablet  Take 1 tablet (10 mg total) by mouth 2 (two) times daily as needed for muscle spasms. 12/20/22  Yes Tanda Rockers A, DO  amLODipine (NORVASC) 10 MG tablet Take 1 tablet (10 mg total) by mouth at bedtime. 11/19/22   Lockie Mola, MD  cetirizine (ZYRTEC) 10 MG tablet Take 1 tablet (10 mg total) by mouth daily. Patient not taking: Reported on 03/28/2022 08/25/19   Myrene Buddy, MD  ferrous sulfate 325 (65 FE) MG tablet Take 325 mg by mouth daily with breakfast.    [provider]  hydrOXYzine (VISTARIL) 25 MG capsule Take 1 capsule (25 mg total) by mouth 3 (three) times daily as needed for anxiety. Patient not taking: Reported on 06/18/2022 03/28/22   Sabino Dick, DO  lidocaine (LIDODERM) 5 % Place 1 patch onto the skin daily. Remove & Discard patch within 12 hours or as directed by MD 11/19/22   Lockie Mola, MD  loperamide (IMODIUM A-D) 2 MG tablet Take 1 tablet (2 mg total) by mouth 4 (four) times daily as needed for diarrhea or loose stools. 08/13/22   Vanetta Mulders, MD  losartan-hydrochlorothiazide (HYZAAR) 50-12.5 MG tablet Take 1 tablet by mouth daily. 11/19/22   Lockie Mola, MD  MULTIPLE VITAMINS PO Take by mouth.    [provider]  ondansetron (ZOFRAN-ODT) 4 MG disintegrating tablet Take 1 tablet (4 mg total) by mouth every 8 (eight) hours as needed for nausea or vomiting. 08/13/22   Vanetta Mulders, MD  sertraline (ZOLOFT) 50 MG tablet Take 1 tablet (50 mg total) by mouth daily. Patient not taking: Reported on 06/18/2022 03/28/22   Sabino Dick, DO                                                                                                                                    Past Surgical History Past Surgical History:  Procedure Laterality Date   ABDOMINAL HYSTERECTOMY     BREAST SURGERY     breast biopsy-right breast   HYSTERECTOMY ABDOMINAL WITH SALPINGO-OOPHORECTOMY Bilateral 07/21/2018   Procedure: TOTAL HYSTERECTOMY ABDOMINAL, BILATERAL  SALPINGO-OOPHORECTOMY;  Surgeon: Dara Lords, MD;  Location: [facility];  Service: Gynecology;  Laterality: Bilateral;   TUBAL LIGATION     Family History Family History  Problem Relation Age of Onset   Hypertension Mother    Bladder Cancer Mother    Colon cancer Neg Hx    Esophageal cancer Neg Hx    Stomach cancer Neg Hx    Rectal cancer Neg Hx     Social History Social History   Tobacco Use   Smoking status: Former    Current packs/day: 0.00    Average packs/day: 0.5 packs/day for 28.0 years (14.0 ttl pk-yrs)    Types: Cigarettes    Start date: 09/20/1982    Quit date: 09/20/2010    Years since quitting: 12.2   Smokeless tobacco: Never  Vaping Use   Vaping status: Never Used  Substance Use Topics   Alcohol use: Yes    Alcohol/week: 2.0 standard drinks of alcohol    Types: 2 Cans of beer per week   Drug use: Not Currently    Types: Marijuana   Allergies Latex, Amoxicillin, Naproxen, and Tramadol  Review of Systems Review of Systems  Constitutional:  Negative for chills and fever.  HENT:  Negative for trouble swallowing.   Respiratory:  Negative for chest tightness and shortness of breath.   Cardiovascular:  Negative for chest pain and palpitations.  Gastrointestinal:  Positive for abdominal pain. Negative for diarrhea, nausea and vomiting.  Genitourinary:  Positive for flank pain and frequency. Negative for urgency, vaginal bleeding and vaginal discharge.  Musculoskeletal:  Positive for back pain. Negative for gait problem.  Skin:  Negative for rash and wound.  All other systems reviewed and are negative.   Physical Exam Vital Signs  I have reviewed the triage vital signs BP (!) 187/81 (BP Location: Right Arm)   Pulse 86   Temp 97.8 F (36.6 C) (Oral)   Resp 16   Ht 5\' 5"  (1.651 m)   Wt 106.6 kg   LMP 06/24/2018   SpO2 100%   BMI 39.11 kg/m  Physical Exam Vitals and nursing note reviewed.  Constitutional:      General: She is  not in acute distress.    Appearance: Normal appearance. She is obese. She is not ill-appearing.  HENT:  Head: Normocephalic and atraumatic.     Right Ear: External ear normal.     Left Ear: External ear normal.     Nose: Nose normal.     Mouth/Throat:     Mouth: Mucous membranes are moist.  Eyes:     General: No scleral icterus.       Right eye: No discharge.        Left eye: No discharge.  Cardiovascular:     Rate and Rhythm: Normal rate and regular rhythm.     Pulses: Normal pulses.     Heart sounds: Normal heart sounds.  Pulmonary:     Effort: Pulmonary effort is normal. No respiratory distress.     Breath sounds: Normal breath sounds. No stridor.  Abdominal:     General: Abdomen is flat. There is no distension.     Palpations: Abdomen is soft.     Tenderness: There is abdominal tenderness in the left lower quadrant. There is no guarding or rebound.    Musculoskeletal:     Cervical back: No rigidity.       Back:     Right lower leg: No edema.     Left lower leg: No edema.     Comments: Pt reports improvement to her pain with palpation of paraspinal muscles  No midline spinous process tenderness to palpation or percussion, no crepitus or step-off.   No rash noted   Skin:    General: Skin is warm and dry.     Capillary Refill: Capillary refill takes less than 2 seconds.  Neurological:     Mental Status: She is alert.  Psychiatric:        Mood and Affect: Mood normal.        Behavior: Behavior normal. Behavior is cooperative.     ED Results and Treatments Labs (all labs ordered are listed, but only abnormal results are displayed) Labs Reviewed  URINALYSIS, ROUTINE W REFLEX MICROSCOPIC - Abnormal; Notable for the following components:      Result Value   APPearance HAZY (*)    Specific Gravity, Urine 1.036 (*)    Hgb urine dipstick TRACE (*)    Ketones, ur TRACE (*)    Protein, ur 30 (*)    All other components within normal limits  CBC WITH  DIFFERENTIAL/PLATELET - Abnormal; Notable for the following components:   MCV 79.6 (*)    MCH 25.7 (*)    Platelets 412 (*)    All other components within normal limits  COMPREHENSIVE METABOLIC PANEL - Abnormal; Notable for the following components:   Potassium 3.3 (*)    Total Bilirubin 0.2 (*)    All other components within normal limits  LIPASE, BLOOD  PREGNANCY, URINE                                                                                                                          Radiology CT ABDOMEN PELVIS W CONTRAST  Result Date: 12/20/2022 CLINICAL  DATA:  Left lower quadrant abdominal pain EXAM: CT ABDOMEN AND PELVIS WITH CONTRAST TECHNIQUE: Multidetector CT imaging of the abdomen and pelvis was performed using the standard protocol following bolus administration of intravenous contrast. RADIATION DOSE REDUCTION: This exam was performed according to the departmental dose-optimization program which includes automated exposure control, adjustment of the mA and/or kV according to patient size and/or use of iterative reconstruction technique. CONTRAST:  85mL OMNIPAQUE IOHEXOL 300 MG/ML  SOLN COMPARISON:  Noncontrast CT 07/01/2018 and older FINDINGS: Lower chest: There is some linear opacity lung bases likely scar or atelectasis. No pleural effusion. There is new 3 mm nodule subpleural right lower lobe anterolateral on series 4 image 2 similar focus medial right lower lobe on series 4, image 16. Hepatobiliary: There is a subtle lesion identified in the right hepatic lobe on series 2, image 27 which has progressive enhancement on delayed imaging measuring 16 mm likely a small hemangioma. This has been present on previous examinations as well. Patent portal vein. Gallbladder is nondilated. Pancreas: Unremarkable. No pancreatic ductal dilatation or surrounding inflammatory changes. Spleen: Normal in size without focal abnormality. Adrenals/Urinary Tract: The adrenal glands are preserved. No  enhancing renal mass or collecting system dilatation. 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 There are some tiny low-attenuation lesions along lower pole of the right kidney, too small to completely characterize but likely a benign cyst, Bosniak 2 lesion. No specific imaging follow-up. Additional foci along the upper pole of each kidney. Again no specific imaging follow-up. The ureters have normal course and caliber extending down to the bladder. Preserved contours of the urinary bladder. Stomach/Bowel: Moderate debris in the stomach. Moderate colonic stool. Small and large bowel are nondilated. No free air or free fluid. Appendix is not well seen in the right lower quadrant but there is no pericecal stranding or fluid. Vascular/Lymphatic: Aortic atherosclerosis. No enlarged abdominal or pelvic lymph nodes. Reproductive: Status post hysterectomy since the prior CT. No adnexal masses. Other: No free air or free fluid. Slight rectus muscle diastasis with protuberance of fat at the level of the umbilicus. Musculoskeletal: Curvature of the lumbar spine. Scattered degenerative changes. IMPRESSION: No bowel obstruction, free air or free fluid.  Scattered stool. Benign hepatic lesion, hemangioma as seen previously. Interval hysterectomy. Tiny lung nodules under 5 mm not seen on the prior CT. No follow-up needed if patient is low-risk (and has no known or suspected primary neoplasm). Non-contrast chest CT can be considered in 12 months if patient is high-risk. This recommendation follows the consensus statement: Guidelines for Management of Incidental Pulmonary Nodules Detected on CT Images: From the Fleischner Society 2017; Radiology 2017; 284:228-243. Electronically Signed   By: Karen Kays M.D.   On: 12/20/2022 18:45   CT L-SPINE NO CHARGE  Result Date: 12/20/2022 CLINICAL DATA:  Worsening back pain.  Left lower quadrant pain. EXAM: CT LUMBAR SPINE WITHOUT CONTRAST TECHNIQUE: Multidetector CT imaging of  the lumbar spine was performed without intravenous contrast administration. Multiplanar CT image reconstructions were also generated. RADIATION DOSE REDUCTION: This exam was performed according to the departmental dose-optimization program which includes automated exposure control, adjustment of the mA and/or kV according to patient size and/or use of iterative reconstruction technique. COMPARISON:  Radiograph 09/04/2022 FINDINGS: Segmentation: 5 lumbar type vertebrae. Alignment: Levo scoliotic curvature is unchanged from prior radiograph. No listhesis. Vertebrae: No acute fracture or focal pathologic process. Paraspinal and other soft tissues: Assessed on concurrent abdominopelvic CT, reported separately. Disc levels: Disc space narrowing and spurring with  vacuum phenomena at L5-S1. Facet hypertrophy at L4-L5 and L5-S1. There is right neural foraminal stenosis at L5-S1. IMPRESSION: 1. No acute osseous abnormality of the lumbar spine. 2. Levo scoliotic curvature is unchanged from prior radiograph. 3. Degenerative changes include degenerative disc disease and facet hypertrophy at L5-S1 with right neural foraminal stenosis. Facet hypertrophy at L4-L5. Electronically Signed   By: Narda Rutherford M.D.   On: 12/20/2022 18:44    Pertinent labs & imaging results that were available during my care of the patient were reviewed by me and considered in my medical decision making (see MDM for details).  Medications Ordered in ED Medications  sodium chloride 0.9 % bolus 1,000 mL (0 mLs Intravenous Stopped 12/20/22 1812)  HYDROcodone-acetaminophen (NORCO/VICODIN) 5-325 MG per tablet 1 tablet (1 tablet Oral Given 12/20/22 1737)  iohexol (OMNIPAQUE) 300 MG/ML solution 100 mL (85 mLs Intravenous Contrast Given 12/20/22 1821)                                                                                                                                     Procedures Procedures  (including critical care time)  Medical  Decision Making / ED Course    Medical Decision Making:    Virjinia Mantey is a 52 y.o. female with past medical history as below, significant for chronic back pain, HTN, obesity, anxiety who presents to the ED with complaint of flank pain/LLQ pain/ lumbar pain/urinary frequency. . The complaint involves an extensive differential diagnosis and also carries with it a high risk of complications and morbidity.  Serious etiology was considered. Ddx includes but is not limited to: Differential diagnosis includes but is not exclusive to ectopic pregnancy, ovarian cyst, ovarian torsion, acute appendicitis, urinary tract infection, endometriosis, bowel obstruction, hernia, colitis, renal colic, gastroenteritis, volvulus etc. Differential diagnosis includes but is not exclusive to musculoskeletal back pain, renal colic, urinary tract infection, pyelonephritis, intra-abdominal causes of back pain, aortic aneurysm or dissection, cauda equina syndrome, sciatica, lumbar disc disease, thoracic disc disease, etc.   Complete initial physical exam performed, notably the patient  was nad, sitting upright, non peritoneal abdomen.    Reviewed and confirmed nursing documentation for past medical history, family history, social history.  Vital signs reviewed.    Clinical Course as of 12/20/22 2056  Thu Dec 20, 2022  1610 Imaging stable [SG]  1934 Symptoms resolved  [SG]  2019 CT with pulm nodules, likely hemangioma  [SG]  2019 CTL with degenerative changes  [SG]    Clinical Course User Index [SG] Sloan Leiter, DO    Narrative: 52 yo female  Here with low back pain, urination changes  Labs stable No uti on UA Her K is mildly reduced CTAP and CTL stable, DDD noted and other incidental findings as well D/w pt in regards to findings She is feeling much better Tolerating PO Recommend she f/u with pcp in regards to recurrent back pain,  no heavy lifting F/u with spine clinic if symptoms persist  Patient  presents with low back pain without signs of spinal cord compression, cauda equina syndrome, infection, aneurysm, or other serious etiology. The patient is neurologically intact. Given the extremely low risk of these diagnoses further testing and evaluation for these possibilities does not appear to be indicated at this time. Detailed discussions were had with the patient and/or family and caregivers, regarding current findings, and need for close f/u with PCP or on call doctor. The patient has been instructed to return immediately if the symptoms worsen in any way. Patient verbalized understanding and is in agreement with current care plan. All questions answered prior to discharge.                 Additional history obtained: -Additional history obtained from na -External records from outside source obtained and reviewed including: Chart review including previous notes, labs, imaging, consultation notes including primary care documentation, allergy list, pdmp   Lab Tests: -I ordered, reviewed, and interpreted labs.   The pertinent results include:   Labs Reviewed  URINALYSIS, ROUTINE W REFLEX MICROSCOPIC - Abnormal; Notable for the following components:      Result Value   APPearance HAZY (*)    Specific Gravity, Urine 1.036 (*)    Hgb urine dipstick TRACE (*)    Ketones, ur TRACE (*)    Protein, ur 30 (*)    All other components within normal limits  CBC WITH DIFFERENTIAL/PLATELET - Abnormal; Notable for the following components:   MCV 79.6 (*)    MCH 25.7 (*)    Platelets 412 (*)    All other components within normal limits  COMPREHENSIVE METABOLIC PANEL - Abnormal; Notable for the following components:   Potassium 3.3 (*)    Total Bilirubin 0.2 (*)    All other components within normal limits  LIPASE, BLOOD  PREGNANCY, URINE    Notable for no uti  EKG   EKG Interpretation Date/Time:    Ventricular Rate:    PR Interval:    QRS Duration:    QT Interval:     QTC Calculation:   R Axis:      Text Interpretation:           Imaging Studies ordered: I ordered imaging studies including CTAP CTL I independently visualized the following imaging with scope of interpretation limited to determining acute life threatening conditions related to emergency care; findings noted above, significant for DDD, no free air, agree w/ radiologist I independently visualized and interpreted imaging. I agree with the radiologist interpretation   Medicines ordered and prescription drug management: Meds ordered this encounter  Medications   sodium chloride 0.9 % bolus 1,000 mL   HYDROcodone-acetaminophen (NORCO/VICODIN) 5-325 MG per tablet 1 tablet   iohexol (OMNIPAQUE) 300 MG/ML solution 100 mL   acetaminophen (TYLENOL) 325 MG tablet    Sig: Take 2 tablets (650 mg total) by mouth every 6 (six) hours as needed.    Dispense:  36 tablet    Refill:  0   cyclobenzaprine (FLEXERIL) 10 MG tablet    Sig: Take 1 tablet (10 mg total) by mouth 2 (two) times daily as needed for muscle spasms.    Dispense:  20 tablet    Refill:  0    -I have reviewed the patients home medicines and have made adjustments as needed   Consultations Obtained: na   Cardiac Monitoring: Continous pulse oximetry on room air 99-100% interpreted by myself  Social Determinants of Health:  Diagnosis or treatment significantly limited by social determinants of health: former smoker and obesity   Reevaluation: After the interventions noted above, I reevaluated the patient and found that they have improved  Co morbidities that complicate the patient evaluation  Past Medical History:  Diagnosis Date   Allergy    Anemia    Anxiety    Cellulitis    Chest pain    Childhood asthma    Chronic back pain    "all over" (05/29/2018)   Complication of anesthesia    "difficult waking up " (03/30/2019)   Depression    Eczema    Hypertension    Scoliosis       Dispostion: Disposition  decision including need for hospitalization was considered, and patient discharged from emergency department.    Final Clinical Impression(s) / ED Diagnoses Final diagnoses:  DDD (degenerative disc disease), lumbar  Lung nodule  Low back pain without sciatica, unspecified back pain laterality, unspecified chronicity        Sloan Leiter, DO 12/20/22 2056

## 2023-01-13 DIAGNOSIS — Z419 Encounter for procedure for purposes other than remedying health state, unspecified: Secondary | ICD-10-CM | POA: Diagnosis not present

## 2023-02-06 ENCOUNTER — Other Ambulatory Visit: Payer: Self-pay

## 2023-02-06 DIAGNOSIS — I10 Essential (primary) hypertension: Secondary | ICD-10-CM

## 2023-02-06 NOTE — Progress Notes (Addendum)
Susan Sanders 1970-10-25 [id number]  Patient outreached by Thomasene Ripple , PharmD Candidate on 02/06/23.  Blood Pressure Readings: Last documented ambulatory systolic blood pressure: 170 Last documented ambulatory diastolic blood pressure: 89 Does the patient have a validated home blood pressure machine?: Yes However, patient is not taking BP right now, encouraged to take 2-3x/week.   Medication review was performed. Is the patient taking their medications as prescribed?: Yes   The following barriers to adherence were noted: Does the patient have cost concerns?: No Does the patient have transportation concerns?: No Does the patient need assistance obtaining refills?: Yes (pt going to ask for more refills at PCP visit) Does the patient occassionally forget to take some of their prescribed medications?: No Does the patient feel like one/some of their medications make them feel poorly?: No Does the patient have questions or concerns about their medications?: No Does the patient have a follow up scheduled with their primary care provider/cardiologist?: Yes   Interventions: Interventions Completed: Medications were reviewed, Patient was educated on goal blood pressures and long term health implications of elevated blood pressure, Patient was educated on proper technique to check home blood pressure and reminded to bring home machine and readings to next provider appointment, Patient was counseled on lifestyle modifications to improve blood pressure, including DASH diet and 150 of exercise/week.  The patient has follow up scheduled: 02/15/23 PCP: Lockie Mola, MD   Thomasene Ripple, Student-PharmD

## 2023-02-12 DIAGNOSIS — Z419 Encounter for procedure for purposes other than remedying health state, unspecified: Secondary | ICD-10-CM | POA: Diagnosis not present

## 2023-02-15 ENCOUNTER — Ambulatory Visit (INDEPENDENT_AMBULATORY_CARE_PROVIDER_SITE_OTHER): Payer: Medicaid Other | Admitting: Family Medicine

## 2023-02-15 ENCOUNTER — Ambulatory Visit: Payer: Medicaid Other | Admitting: Family Medicine

## 2023-02-15 VITALS — BP 144/85 | HR 95 | Ht 65.0 in | Wt 212.4 lb

## 2023-02-15 DIAGNOSIS — I10 Essential (primary) hypertension: Secondary | ICD-10-CM | POA: Diagnosis not present

## 2023-02-15 DIAGNOSIS — R519 Headache, unspecified: Secondary | ICD-10-CM | POA: Diagnosis not present

## 2023-02-15 MED ORDER — LOSARTAN POTASSIUM-HCTZ 100-12.5 MG PO TABS: 1.0000 | ORAL_TABLET | Freq: Every day | ORAL | 1 refills | Status: DC

## 2023-02-15 MED ORDER — MAGNESIUM OXIDE 400 MG PO TABS
400.0000 mg | ORAL_TABLET | Freq: Every day | ORAL | 3 refills | Status: DC | PRN
Start: 2023-02-15 — End: 2023-06-13

## 2023-02-15 NOTE — Patient Instructions (Signed)
I have increased your Hyzaar dose  Please follow up in Tues Oct 15th with our pharmacist  Please make an appointment in 4 weeks to follow your blood pressure  You can try magnesium oxide daily as needed for headaches, take with tylenol

## 2023-02-15 NOTE — Assessment & Plan Note (Addendum)
Patient's blood pressure is not controlled today. BP: (!) 144/85. Patient's medication regimen includes amlodipine 10 mg daily, Hyzaar 50-12.5 daily.. -Increase Hyzaar to 100-12.5 daily, continue amlodipine. -Referral to pharmacy clinic due to On 3 or more antihypertensives and still not well controlled and Discrepancy between home and clinic BP. Scheduled w/ Dr. Raymondo Band 10/15 -Recommend BMP at next visit -Follow up in 4 weeks w/ PCP

## 2023-02-15 NOTE — Progress Notes (Signed)
    SUBJECTIVE:   CHIEF COMPLAINT / HPI:   HTN On amlodipine 10 mg daily, Hyzaar 50-12.5 mg daily. Has been taking these regularly BP remains elevated Reports frequent headaches and occasional vision changes (blurriness, sometimes sees white spots), these are chronic but these have gotten a little worse over the past few weeks Checks her BP in the mornings, often sees systolic in the upper 100s-low 200s Former smoker, 0.5ppd x about 40 years Denies CP/SOB, abd pain  PERTINENT  PMH / PSH: HTN  OBJECTIVE:   BP (!) 144/85   Pulse 95   Ht 5\' 5"  (1.651 m)   Wt 212 lb 6 oz (96.3 kg)   LMP 06/24/2018   SpO2 100%   BMI 35.34 kg/m    General: NAD, pleasant, able to participate in exam Cardiac: RRR, no murmurs auscultated Respiratory: CTAB, normal WOB Abdomen: soft, non-tender, non-distended, normoactive bowel sounds Extremities: warm and well perfused, no edema or cyanosis Skin: warm and dry, no rashes noted Neuro: PERRLA, EOMI.  Alert.  Cranial nerves II through XII intact without focal deficit.  Speech normal, gait normal. Psych: Normal affect and mood  ASSESSMENT/PLAN:   Assessment & Plan Essential hypertension Patient's blood pressure is not controlled today. BP: (!) 144/85. Patient's medication regimen includes amlodipine 10 mg daily, Hyzaar 50-12.5 daily.. -Increase Hyzaar to 100-12.5 daily, continue amlodipine. -Referral to pharmacy clinic due to On 3 or more antihypertensives and still not well controlled and Discrepancy between home and clinic BP. Scheduled w/ Dr. Raymondo Band 10/15 -Recommend BMP at next visit -Follow up in 4 weeks w/ PCP Nonintractable headache, unspecified chronicity pattern, unspecified headache type Trial mag ox daily PRN for headache, continue tylenol. Reassuringly benign neuro exam. Provided strict return precautions given elevated BP. Hopeful for improvement with better BP control.   Vonna Drafts, MD [facility]

## 2023-02-22 MED ORDER — AMLODIPINE BESYLATE 10 MG PO TABS
10.0000 mg | ORAL_TABLET | Freq: Every day | ORAL | 3 refills | Status: DC
Start: 2023-02-22 — End: 2023-06-13

## 2023-02-22 NOTE — Addendum Note (Signed)
Addended by: Lockie Mola on: 02/22/2023 01:49 PM   Modules accepted: Orders

## 2023-02-26 ENCOUNTER — Encounter: Payer: Self-pay | Admitting: Pharmacist

## 2023-02-26 ENCOUNTER — Ambulatory Visit (INDEPENDENT_AMBULATORY_CARE_PROVIDER_SITE_OTHER): Payer: Medicaid Other | Admitting: Pharmacist

## 2023-02-26 VITALS — BP 192/80 | HR 83 | Wt 215.2 lb

## 2023-02-26 DIAGNOSIS — I10 Essential (primary) hypertension: Secondary | ICD-10-CM

## 2023-02-26 MED ORDER — EPLERENONE 25 MG PO TABS: 25.0000 mg | ORAL_TABLET | Freq: Every day | ORAL | 11 refills | Status: DC

## 2023-02-26 NOTE — Patient Instructions (Signed)
It was nice to see you today!  Your goal blood pressure is  <130/80 mmHg.  Medication Changes: START eplerenone 25 mg once daily  Continue all other medication the same.   Monitor blood pressure at home daily and keep a log (on your phone or piece of paper) to bring with you to your next visit. Write down date, time, blood pressure and pulse.  Keep up the good work with diet and exercise. Aim for a diet full of vegetables, fruit and lean meats (chicken, [place], fish). Try to limit salt intake by eating fresh or frozen vegetables (instead of canned), rinse canned vegetables prior to cooking and do not add any additional salt to meals.

## 2023-02-26 NOTE — Progress Notes (Signed)
S:     Chief Complaint  Patient presents with   Medication Management    HTN - on 3 meds   52 y.o. female who presents for hypertension evaluation, education, and management.  PMH is significant for HTN.  Patient was referred and last seen by Primary Care Provider, Dr. Barb Merino, on 02/15/2023.   At last visit, increased losartan-hydrochlorothiazide from 50-12.5 mg daily to 100-12.5 mg daily, continued amlodipine 10 mg daily.   Today, patient arrives in good spirits and presents without assistance. Denies dizziness, headache, blurred vision, swelling.   Reported past lightheaded/dizziness when moving quickly, but reports that has not been happening as frequently since starting new dose of losartan-hydrochlorothiazide.   Patient reports hypertension was diagnosed in 2017.   Family/Social history: Mother has HTN  Medication adherence good . Patient has taken BP medications today.   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   Physical Exam Vitals reviewed.  Constitutional:      Appearance: Normal appearance.  Pulmonary:     Effort: Pulmonary effort is normal.  Neurological:     Mental Status: She is alert.  Psychiatric:        Mood and Affect: Mood normal.        Behavior: Behavior normal.        Thought Content: Thought content normal.        Judgment: Judgment normal.     Last 3 Office BP readings: BP Readings  from Last 3 Encounters:  02/26/23 (!) 192/80  02/15/23 (!) 144/85  12/20/22 (!) 173/96    BMET    Component Value Date/Time   NA 139 12/20/2022 1630   NA 139 03/28/2022 1100   K 3.3 (L) 12/20/2022 1630   CL 104 12/20/2022 1630   CO2 29 12/20/2022 1630   GLUCOSE 77 12/20/2022 1630   BUN 16 12/20/2022 1630   BUN 7 03/28/2022 1100   CREATININE 0.91 12/20/2022 1630   CREATININE 0.74 07/14/2015 1658   CALCIUM 9.9 12/20/2022 1630   GFRNONAA >60 12/20/2022 1630   GFRNONAA >89 07/14/2015 1658   GFRAA 96 01/04/2020 0943   GFRAA >89 07/14/2015 1658    Renal function: CrCl cannot be calculated (Patient's most recent lab result is older than the maximum 21 days allowed.).  Clinical ASCVD: No  The ASCVD Risk score (Arnett DK, et al., 2019) failed to calculate for the following reasons:   Cannot find a previous HDL lab   Cannot find a previous total cholesterol lab  Patient is participating in a Managed Medicaid Plan:  Yes   A/P: Hypertension diagnosed in 2017 currently uncontrolled on current medications. BP goal < 130/80  mmHg. Medication adherence appears good. Control is suboptimal due to suboptimal medication regimen.  -Started eplerenone 25 mg once daily  Continued current agents at current doses.  -Patient educated on purpose, proper use, and potential adverse effects.  -F/u labs ordered - BMET -Counseled on lifestyle modifications for blood pressure control including reduced dietary sodium, increased  exercise, adequate sleep. -Encouraged patient to check BP at home and bring log of readings to next visit. Counseled on proper use of home BP cuff.   Results reviewed and written information provided.    Written patient instructions provided. Patient verbalized understanding of treatment plan.  Total time in face to face counseling 24 minutes.    Follow-up:  Pharmacist 03/19/2023. PCP clinic visit in 03/18/2023 w/ Dr. Marsh Dolly.  Patient seen with Shona Simpson, PharmD  Candidate.

## 2023-02-26 NOTE — Assessment & Plan Note (Signed)
Hypertension diagnosed in 2017 currently uncontrolled on current medications. BP goal < 130/80  mmHg. Medication adherence appears good. Control is suboptimal due to suboptimal medication regimen.  -Started eplerenone 25 mg once daily  Continued current agents at current doses.  -Patient educated on purpose, proper use, and potential adverse effects.  -F/u labs ordered - BMET -Counseled on lifestyle modifications for blood pressure control including reduced dietary sodium, increased exercise, adequate sleep. -Encouraged patient to check BP at home and bring log of readings to next visit. Counseled on proper use of home BP cuff.

## 2023-02-27 LAB — BASIC METABOLIC PANEL
BUN/Creatinine Ratio: 11 (ref 9–23)
BUN: 10 mg/dL (ref 6–24)
CO2: 21 mmol/L (ref 20–29)
Calcium: 9.7 mg/dL (ref 8.7–10.2)
Chloride: 104 mmol/L (ref 96–106)
Creatinine, Ser: 0.9 mg/dL (ref 0.57–1.00)
Glucose: 89 mg/dL (ref 70–99)
Potassium: 4 mmol/L (ref 3.5–5.2)
Sodium: 141 mmol/L (ref 134–144)
eGFR: 76 mL/min/{1.73_m2} (ref >59)

## 2023-02-28 NOTE — Progress Notes (Signed)
Reviewed and agree with Dr Koval's plan.   

## 2023-03-04 ENCOUNTER — Telehealth: Payer: Self-pay | Admitting: Pharmacist

## 2023-03-04 NOTE — Telephone Encounter (Signed)
Patient contacted for follow-up of lab results.   Left message for patient to return call if interested in lab results.   Also sent MyChart message.    Total time with patient call and documentation of interaction: 7 minutes

## 2023-03-04 NOTE — Telephone Encounter (Signed)
-----   Message from [facility] McDiarmid sent at 02/28/2023  5:00 PM EDT -----  ----- Message ----- From: Nell Range Lab Results In Sent: 02/27/2023   8:15 AM EDT To: Leighton Roach McDiarmid, MD

## 2023-03-04 NOTE — Telephone Encounter (Signed)
Reviewed and agree with Dr Koval's plan.   

## 2023-03-15 DIAGNOSIS — Z419 Encounter for procedure for purposes other than remedying health state, unspecified: Secondary | ICD-10-CM | POA: Diagnosis not present

## 2023-03-18 ENCOUNTER — Ambulatory Visit (INDEPENDENT_AMBULATORY_CARE_PROVIDER_SITE_OTHER): Payer: Medicaid Other | Admitting: Family Medicine

## 2023-03-18 ENCOUNTER — Encounter: Payer: Self-pay | Admitting: Family Medicine

## 2023-03-18 VITALS — BP 163/74 | HR 92 | Wt 215.0 lb

## 2023-03-18 DIAGNOSIS — E6609 Other obesity due to excess calories: Secondary | ICD-10-CM | POA: Diagnosis not present

## 2023-03-18 DIAGNOSIS — I10 Essential (primary) hypertension: Secondary | ICD-10-CM | POA: Diagnosis not present

## 2023-03-18 MED ORDER — SEMAGLUTIDE-WEIGHT MANAGEMENT 0.25 MG/0.5ML ~~LOC~~ SOAJ
0.2500 mg | SUBCUTANEOUS | 0 refills | Status: DC
Start: 2023-03-18 — End: 2023-03-19

## 2023-03-18 NOTE — Progress Notes (Unsigned)
    SUBJECTIVE:   CHIEF COMPLAINT / HPI:   Hypertension Follow up  Patient says she has been taking both the hydrochlorothiazide and losartan every day without any adverse side effects. She says her blood pressures at home have been around 130s/90s.   Obesity  Patient says she is interested in [place] as she has degenerative disc disease, hypertension and other aspects of metabolic disease. She does not have family history of thyroid disease, pancreatitis or gallbladder disease. She understands that she would have to be on this medication possibly life long.    PERTINENT  PMH / PSH: HTN   OBJECTIVE:   BP (!) 163/74   Pulse 92   Wt 215 lb (97.5 kg)   LMP 06/24/2018   SpO2 99%   BMI 35.78 kg/m   General: well appearing, in no acute distress, obese CV: RRR, radial pulses equal and palpable, no BLE edema  Resp: Normal work of breathing on room air, CTAB Abd: Soft, non tender, non distended     ASSESSMENT/PLAN:   Assessment & Plan Essential hypertension BP here today is elevated; however, patient was upset at the time and may have caused momentary increased BP. Home BP controlled.  - BMP, urine albumin creatinine ratio - f/u BP in 2-4 weeks  Obesity due to excess calories without serious comorbidity, unspecified class Start wegovy 0.25 mg  - Counseled regarding protein intake and strength exercises to maintain muscle - Follow up in 4 weeks.        Lockie Mola, MD [facility]

## 2023-03-19 ENCOUNTER — Ambulatory Visit (INDEPENDENT_AMBULATORY_CARE_PROVIDER_SITE_OTHER): Payer: Medicaid Other | Admitting: Pharmacist

## 2023-03-19 ENCOUNTER — Other Ambulatory Visit (HOSPITAL_COMMUNITY): Payer: Self-pay

## 2023-03-19 ENCOUNTER — Encounter: Payer: Self-pay | Admitting: Pharmacist

## 2023-03-19 VITALS — BP 158/68 | HR 91 | Wt 213.8 lb

## 2023-03-19 DIAGNOSIS — I10 Essential (primary) hypertension: Secondary | ICD-10-CM | POA: Diagnosis not present

## 2023-03-19 DIAGNOSIS — E66812 Obesity, class 2: Secondary | ICD-10-CM

## 2023-03-19 DIAGNOSIS — Z6835 Body mass index (BMI) 35.0-35.9, adult: Secondary | ICD-10-CM

## 2023-03-19 LAB — BASIC METABOLIC PANEL
BUN/Creatinine Ratio: 15 (ref 9–23)
BUN: 14 mg/dL (ref 6–24)
CO2: 24 mmol/L (ref 20–29)
Calcium: 9.7 mg/dL (ref 8.7–10.2)
Chloride: 100 mmol/L (ref 96–106)
Creatinine, Ser: 0.92 mg/dL (ref 0.57–1.00)
Glucose: 83 mg/dL (ref 70–99)
Potassium: 4.1 mmol/L (ref 3.5–5.2)
Sodium: 140 mmol/L (ref 134–144)
eGFR: 74 mL/min/{1.73_m2} (ref >59)

## 2023-03-19 LAB — MICROALBUMIN / CREATININE URINE RATIO
Creatinine, Urine: 75.6 mg/dL
Microalb/Creat Ratio: <4 mg/g{creat} (ref 0–29)
Microalbumin, Urine: <3 ug/mL

## 2023-03-19 MED ORDER — WEGOVY 0.25 MG/0.5ML ~~LOC~~ SOAJ: 0.2500 mg | SUBCUTANEOUS | Status: DC

## 2023-03-19 MED ORDER — CYCLOBENZAPRINE HCL 10 MG PO TABS: 10.0000 mg | ORAL_TABLET | Freq: Two times a day (BID) | ORAL | 0 refills | Status: DC | PRN

## 2023-03-19 MED ORDER — EPLERENONE 50 MG PO TABS
50.0000 mg | ORAL_TABLET | Freq: Every day | ORAL | 3 refills | Status: DC
Start: 2023-03-19 — End: 2023-04-08

## 2023-03-19 MED ORDER — WEGOVY 0.5 MG/0.5ML ~~LOC~~ SOAJ: 0.5000 mg | SUBCUTANEOUS | 1 refills | Status: DC

## 2023-03-19 NOTE — Assessment & Plan Note (Signed)
Hypertension diagnosed in 2017 currently uncontrolled on current medications, but BP is much improved. BP goal < 130/80 mmHg. Medication adherence appears good. Control is suboptimal due to suboptimal medication regimen and resistant hypertension. BMET yesterday revealed stable electrolyte and renal function values.  - Increased eplerenone from 25 mg once daily to 50 mg once daily -Patient educated on purpose, proper use, and potential adverse effects.  -Counseled on lifestyle modifications for blood pressure control including reduced dietary sodium, increased exercise, adequate sleep. -Encouraged patient to check BP at home and bring log of readings to next visit. Counseled on proper use of home BP cuff.

## 2023-03-19 NOTE — Progress Notes (Signed)
S:     Chief Complaint  Patient presents with   Medication Management    HTN - 4 medications; [facility] Sample   52 y.o. female who presents for hypertension evaluation, education, and management.  PMH is significant for HTN - on 4 medications.  Patient was last seen by Primary Care Provider, Dr. Marsh Dolly, on 03/18/2023.   At last visit, BMET WNL.   Today, patient arrives in good spirits and presents without assistance. Denies dizziness, headache, blurred vision, swelling. Reports no headaches since she started eplerenone. No swelling in legs after work, reports going to the bathroom ~8 times/day, wakes up in the middle of the night at least twice.   Patient reports hypertension was diagnosed in 2017.   Family/Social history: Mother has HTN, Brother has HTN    Medication adherence good. Patient has taken BP medications today.   Current antihypertensives include: amlodipine 10 mg once daily, eplerenone 25 mg once daily, losartan-hydrochlorothiazide 100-12.5mg  once daily  Antihypertensives tried in the past include: None  Reported home BP readings: ~170s/68, reports no SBPs in the ~200s anymore, lowest she has seen for SBP is ~174 mmHg  Patient reported dietary habits: Eats 2-3 meals/day   Breakfast: eggs, bacon, toast Dinner: Baked fish/chicken, salads, potatoes/rice Snacks: PB with apples/celery Drinks: water  Patient-reported exercise habits: joined gym and going ~4 days/week for ~3 hours, exercises for back/leg pain, walks on her break at work  ASCVD risk factors include: HTN  O:  Review of Systems  Genitourinary:  Positive for frequency.  Musculoskeletal:  Negative for joint pain.  All other systems reviewed and are negative.   Physical Exam Vitals reviewed.  Constitutional:      Appearance: Normal appearance.  Pulmonary:     Effort: Pulmonary effort is normal.  Neurological:     Mental Status: She is alert.  Psychiatric:        Mood and Affect: Mood normal.         Behavior: Behavior normal.        Thought Content: Thought content normal.        Judgment: Judgment normal.    Last 3 Office BP readings: BP Readings from Last 3 Encounters:  03/19/23 (!) 158/68  03/18/23 (!) 163/74  02/26/23 (!) 192/80    BMET    Component Value Date/Time   NA 140 03/18/2023 1700   K 4.1 03/18/2023 1700   CL 100 03/18/2023 1700   CO2 24 03/18/2023 1700   GLUCOSE 83 03/18/2023 1700   GLUCOSE 77 12/20/2022 1630   BUN 14 03/18/2023 1700   CREATININE 0.92 03/18/2023 1700   CREATININE 0.74 07/14/2015 1658   CALCIUM 9.7 03/18/2023 1700   GFRNONAA >60 12/20/2022 1630   GFRNONAA >89 07/14/2015 1658   GFRAA 96 01/04/2020 0943   GFRAA >89 07/14/2015 1658    Renal function: Estimated Creatinine Clearance: 80.6 mL/min (by C-G formula based on SCr of 0.92 mg/dL).  Clinical ASCVD: No  The ASCVD Risk score (Arnett DK, et al., 2019) failed to calculate for the following reasons:   Cannot find a previous HDL lab   Cannot find a previous total cholesterol lab  Patient is participating in a Managed Medicaid Plan:  Yes    A/P: Hypertension diagnosed in 2017 currently uncontrolled on current medications, but BP is much improved. BP goal < 130/80 mmHg. Medication adherence appears good. Control is suboptimal due to suboptimal medication regimen and resistant hypertension. BMET yesterday revealed stable electrolyte and renal function values.  -  Increased eplerenone from 25 mg once daily to 50 mg once daily -Patient educated on purpose, proper use, and potential adverse effects.  -Counseled on lifestyle modifications for blood pressure control including reduced dietary sodium, increased exercise, adequate sleep. -Encouraged patient to check BP at home and bring log of readings to next visit. Counseled on proper use of home BP cuff.   Weight Management - Patient was prescribed Ozempic (semaglutide) and had issues with Prior Authorization since she does not have  Diabetes. Patient has been going to the gym and made dietary changes, and working to lose weight. - Provided sample of Wegovy (semaglutide) 0.25 mg once weekly  - New prescription for 0.5mg  Reginal Lutes provided  - Patient educated on purpose, proper use, and potential adverse effects of GI upset, smaller portion sizes.   Results reviewed and written information provided.    Written patient instructions provided. Patient verbalized understanding of treatment plan.  Total time in face to face counseling 31 minutes.    Follow-up:  Pharmacist 04/16/2023. PCP clinic visit in 04/16/2023 w/ Dr, Marsh Dolly.  Patient seen with Lendon Ka, PharmD Candidate and Shona Simpson, PharmD Candidate.

## 2023-03-19 NOTE — Assessment & Plan Note (Signed)
BP here today is elevated; however, patient was upset at the time and may have caused momentary increased BP. Home BP controlled.  - BMP, urine albumin creatinine ratio - f/u BP in 2-4 weeks

## 2023-03-19 NOTE — Patient Instructions (Addendum)
It was nice to see you today!  Your goal blood pressure is <130/80 mmHg.  Medication Changes  START Wegovy (semaglutide) 0.25 mg once weekly  - Provided 3 sample pens   Increase eplerenone from 25 mg once daily to 50 mg once daily  Continue all other medication the same.   Monitor blood pressure at home daily and keep a log (on your phone or piece of paper) to bring with you to your next visit. Write down date, time, blood pressure and pulse.  Keep up the good work with diet and exercise. Aim for a diet full of vegetables, fruit and lean meats (chicken, [place], fish). Try to limit salt intake by eating fresh or frozen vegetables (instead of canned), rinse canned vegetables prior to cooking and do not add any additional salt to meals.

## 2023-03-19 NOTE — Assessment & Plan Note (Signed)
Weight Management - Patient was prescribed Ozempic (semaglutide) and had issues with Prior Authorization since she does not have Diabetes. Patient has been going to the gym and made dietary changes, and working to lose weight. - Provided sample of Wegovy (semaglutide) 0.25 mg once weekly  - New prescription for 0.5mg  Wegovy provided  - Patient educated on purpose, proper use, and potential adverse effects of GI upset, smaller portion sizes.

## 2023-03-19 NOTE — Assessment & Plan Note (Signed)
Start wegovy 0.25 mg  - Counseled regarding protein intake and strength exercises to maintain muscle - Follow up in 4 weeks.

## 2023-03-20 NOTE — Progress Notes (Signed)
Reviewed and agree with Dr Koval's plan.   

## 2023-03-25 ENCOUNTER — Other Ambulatory Visit (HOSPITAL_COMMUNITY): Payer: Self-pay

## 2023-03-25 ENCOUNTER — Telehealth: Payer: Self-pay

## 2023-03-25 NOTE — Telephone Encounter (Signed)
Pharmacy Patient Advocate Encounter   Received notification from Fax that prior authorization for [facility] is required/requested.   Insurance verification completed.   The patient is insured through [facility] [place] .   Per test claim: PA required; PA submitted to above mentioned insurance via CoverMyMeds Key/confirmation #/EOC [id number]. Status is pending

## 2023-03-29 NOTE — Telephone Encounter (Signed)
Pharmacy Patient Advocate Encounter  Received notification from [facility] Medicaid that Prior Authorization for [facility] has been APPROVED from 03/25/23 to 03/24/24

## 2023-04-08 ENCOUNTER — Ambulatory Visit (INDEPENDENT_AMBULATORY_CARE_PROVIDER_SITE_OTHER): Payer: Medicaid Other | Admitting: Family Medicine

## 2023-04-08 ENCOUNTER — Encounter: Payer: Self-pay | Admitting: Family Medicine

## 2023-04-08 VITALS — BP 135/69 | HR 72 | Ht 64.0 in | Wt 213.0 lb

## 2023-04-08 DIAGNOSIS — E6609 Other obesity due to excess calories: Secondary | ICD-10-CM | POA: Diagnosis not present

## 2023-04-08 DIAGNOSIS — I10 Essential (primary) hypertension: Secondary | ICD-10-CM

## 2023-04-08 DIAGNOSIS — Z6835 Body mass index (BMI) 35.0-35.9, adult: Secondary | ICD-10-CM

## 2023-04-08 DIAGNOSIS — L918 Other hypertrophic disorders of the skin: Secondary | ICD-10-CM | POA: Diagnosis not present

## 2023-04-08 DIAGNOSIS — E66812 Obesity, class 2: Secondary | ICD-10-CM

## 2023-04-08 MED ORDER — EPLERENONE 50 MG PO TABS
50.0000 mg | ORAL_TABLET | Freq: Every day | ORAL | 3 refills | Status: DC
Start: 2023-04-08 — End: 2023-04-18

## 2023-04-08 MED ORDER — WEGOVY 1 MG/0.5ML ~~LOC~~ SOAJ
1.0000 mg | SUBCUTANEOUS | 2 refills | Status: DC
Start: 2023-04-08 — End: 2023-07-12

## 2023-04-08 NOTE — Progress Notes (Unsigned)
    SUBJECTIVE:   CHIEF COMPLAINT / HPI:   Face and Neck Moles  Patient says her skin tags on the sides of her neck/shoulders have been growing and have been getting caught occasionally irritating her. She also does not like the flat moles/beauty marks on her face. The marks on her face do not bother her physically. The skin tags have not grown rapidly and are not painful at baseline but are bothersome to patient. She has never tried any treatments for them. The skin tags are around the area that sometimes a bra strap would hit.   PERTINENT  PMH / PSH: HTN, GAD, Obesity   OBJECTIVE:   BP 135/69   Pulse 72   Ht 5\' 4"  (1.626 m)   Wt 213 lb (96.6 kg)   LMP 06/24/2018   SpO2 100%   BMI 36.56 kg/m   General: well appearing, in no acute distress CV: RRR, radial pulses equal and palpable Resp: Normal work of breathing on room air Skin: 4-5 very small hyperpigmented skin tags on right side between neck base and beginning of shoulder, 3-4 small hyperpigmented skin tags on left side, no surrounding skin changes, Face with flat hyperpigmented macules no tenderness to touch.    ASSESSMENT/PLAN:   Assessment & Plan Cutaneous skin tags Patient initially wanted removal. Cleaned the skin with alcohol wipe and attempted to snip a small skin tag at the base of right neck with sterile scissors. Patient did not tolerate. Very very minimal bleeding that was hemostatic with wiping the lesion. Attempted cryotherapy of a different skin tag on the same side and patient did not tolerate more than 1 second of cryotherapy. She elected to leave the skin tags along.  - Wound care counseling given for the two tags that were intervened on.  - Follow up if needed, declined dermatology referral for facial moles.  Essential hypertension Controlled at this time.  - Refill epelrenone and hyzaar  Obesity due to excess calories with serious comorbidity, unspecified class Has obesity, sciatic back pain, and GERD as co  morbidities of obesity. No significant weight loss with 0.5 wegovy. Will increase dose to 1 mg and follow up in one month.  - Counseled on lifestyle changes.  - 1 mg wegovy weekly      Lockie Mola, MD [facility]

## 2023-04-09 NOTE — Assessment & Plan Note (Signed)
Controlled at this time.  - Refill epelrenone and hyzaar

## 2023-04-09 NOTE — Assessment & Plan Note (Signed)
Has obesity, sciatic back pain, and GERD as co morbidities of obesity. No significant weight loss with 0.5 wegovy. Will increase dose to 1 mg and follow up in one month.  - Counseled on lifestyle changes.  - 1 mg wegovy weekly

## 2023-04-14 DIAGNOSIS — Z419 Encounter for procedure for purposes other than remedying health state, unspecified: Secondary | ICD-10-CM | POA: Diagnosis not present

## 2023-04-15 ENCOUNTER — Telehealth: Payer: Self-pay

## 2023-04-15 NOTE — Telephone Encounter (Signed)
Patient calls nurse line in regards to [facility].   She reports her dosage was changed and she reports she now needs a prior authorization.   Will forward to pharmacy team for assistance.

## 2023-04-16 ENCOUNTER — Telehealth: Payer: Medicaid Other | Admitting: Family Medicine

## 2023-04-16 ENCOUNTER — Encounter: Payer: Self-pay | Admitting: Pharmacist

## 2023-04-16 ENCOUNTER — Ambulatory Visit (INDEPENDENT_AMBULATORY_CARE_PROVIDER_SITE_OTHER): Payer: Medicaid Other | Admitting: Pharmacist

## 2023-04-16 VITALS — BP 155/71 | HR 76 | Wt 211.0 lb

## 2023-04-16 DIAGNOSIS — I10 Essential (primary) hypertension: Secondary | ICD-10-CM | POA: Diagnosis not present

## 2023-04-16 NOTE — Telephone Encounter (Signed)
PA submitted.  Will await response.   Key: [id number]

## 2023-04-16 NOTE — Progress Notes (Signed)
S:     Chief Complaint  Patient presents with   Medication Management    Diabetes   52 y.o. female who presents for hypertension evaluation, education, and management.  PMH is significant for headaches, muscle cramps and resistant hypertension.  Patient was referred and last seen by Primary Care Provider, Dr. Marsh Dolly, on 04/08/2023.   At last pharmacist visit, eplerenone was increased to 50mg  daily.   Today, patient arrives in good spirits and presents without assistance.  Denies dizziness, headache, blurred vision, swelling or any muscle cramps lately.    Family/Social history: Denies eating any desserts over the Thanksgiving holiday and also going to multiple gym visit. She was excited to note a 2 pound weight loss since last visit.   Medication adherence good . Patient has taken BP medications today.   Current antihypertensives include: amlodipine 10mg , Losartan 100mg , hydrochlorothiazide 12.5mg  and eplerenone 50mg  daily.   Reported home BP readings: lower with systolic home readings reported 135-160 and diastolic readings consistently < Hg.   Patient-reported exercise habits: increased with gym visits.   O:  Review of Systems  Neurological:  Negative for headaches.    Physical Exam Vitals reviewed.  Pulmonary:     Effort: Pulmonary effort is normal.  Neurological:     Mental Status: She is alert.  Psychiatric:        Mood and Affect: Mood normal.        Behavior: Behavior normal.        Thought Content: Thought content normal.        Judgment: Judgment normal.     Last 3 Office BP readings: BP Readings from Last 3 Encounters:  04/16/23 (!) 155/71  04/08/23 135/69  03/19/23 (!) 158/68    BMET    Component Value Date/Time   NA 140 03/18/2023 1700   K 4.1 03/18/2023 1700   CL 100 03/18/2023 1700   CO2 24 03/18/2023 1700   GLUCOSE 83 03/18/2023 1700   GLUCOSE 77 12/20/2022 1630   BUN 14 03/18/2023 1700   CREATININE 0.92 03/18/2023 1700    CREATININE 0.74 07/14/2015 1658   CALCIUM 9.7 03/18/2023 1700   GFRNONAA >60 12/20/2022 1630   GFRNONAA >89 07/14/2015 1658   GFRAA 96 01/04/2020 0943   GFRAA >89 07/14/2015 1658    Renal function: CrCl cannot be calculated (Patient's most recent lab result is older than the maximum 21 days allowed.).  Clinical ASCVD:  The ASCVD Risk score (Arnett DK, et al., 2019) failed to calculate for the following reasons:   Cannot find a previous HDL lab   Cannot find a previous total cholesterol lab  Patient is participating in a Managed Medicaid Plan:  Yes    A/P: Hypertension longstanding currently with improved control on current four drug medication regimen. BP goal < 130/80 mmHg. Medication adherence appears good. Control is suboptimal due to resistant hypertension with possibly hyperaldosteronism induced effect.  BMET today.  Continue all medications same until potassium result determined.  - Consider dose escalation for eplerenone to 100mg   -Patient educated on purpose, proper use, and potential adverse effects.  -Counseled on lifestyle modifications for blood pressure control including reduced dietary sodium, increased exercise, adequate sleep.  Results reviewed and written information provided.    Written patient instructions provided. Patient verbalized understanding of treatment plan.  Total time in face to face counseling 24 minutes.    Follow-up:  Pharmacist 05/23/2023 for BP reassessment. PCP clinic visit with Dr. Marsh Dolly scheduled  BMET stable and WNL Eplerenone dose increased to 50mg  BID

## 2023-04-16 NOTE — Patient Instructions (Signed)
It was nice to see you today!  Your goal blood pressure is < 150 mmHg  Medication Changes: No change today - labs Next.  I will call you with the results of your labs and then we can discuss possible dose increase of eplerenone.   Continue all other medication the same.   Monitor blood pressure at home daily and keep a log (on your phone or piece of paper) to bring with you to your next visit. Write down date, time, blood pressure and pulse.  Keep up the good work with diet and exercise. Aim for a diet full of vegetables, fruit and lean meats (chicken, [place], fish). Try to limit salt intake by eating fresh or frozen vegetables (instead of canned), rinse canned vegetables prior to cooking and do not add any additional salt to meals.

## 2023-04-16 NOTE — Telephone Encounter (Signed)
[facility] is down for maintenance.   Will re-attempt tomorrow.

## 2023-04-16 NOTE — Assessment & Plan Note (Signed)
Hypertension longstanding currently with improved control on current four drug medication regimen. BP goal < 130/80 mmHg. Medication adherence appears good. Control is suboptimal due to resistant hypertension with possibly hyperaldosteronism induced effect.  BMET today.  Continue all medications same until potassium result determined.  - Consider dose escalation for eplerenone to 100mg   -Patient educated on purpose, proper use, and potential adverse effects.

## 2023-04-17 LAB — BASIC METABOLIC PANEL
BUN/Creatinine Ratio: 13 (ref 9–23)
BUN: 10 mg/dL (ref 6–24)
CO2: 23 mmol/L (ref 20–29)
Calcium: 9.7 mg/dL (ref 8.7–10.2)
Chloride: 101 mmol/L (ref 96–106)
Creatinine, Ser: 0.8 mg/dL (ref 0.57–1.00)
Glucose: 81 mg/dL (ref 70–99)
Potassium: 3.8 mmol/L (ref 3.5–5.2)
Sodium: 141 mmol/L (ref 134–144)
eGFR: 88 mL/min/{1.73_m2} (ref >59)

## 2023-04-17 NOTE — Telephone Encounter (Signed)
Medication approved.   Pharmacy has been updated, however they report no stock of the medication. He reports they have ordered the medication and will let the patient know once it comes in.   Patient has been updated.

## 2023-04-17 NOTE — Progress Notes (Signed)
Reviewed and agree with Dr Koval's plan.   

## 2023-04-18 ENCOUNTER — Telehealth: Payer: Self-pay | Admitting: Pharmacist

## 2023-04-18 DIAGNOSIS — I10 Essential (primary) hypertension: Secondary | ICD-10-CM

## 2023-04-18 MED ORDER — EPLERENONE 50 MG PO TABS: 50.0000 mg | ORAL_TABLET | Freq: Two times a day (BID) | ORAL | Status: DC

## 2023-04-18 NOTE — Telephone Encounter (Signed)
-----   Message from [facility] McDiarmid sent at 04/17/2023  4:06 PM EST -----  ----- Message ----- From: Nell Range Lab Results In Sent: 04/17/2023   8:15 AM EST To: Leighton Roach McDiarmid, MD

## 2023-04-18 NOTE — Assessment & Plan Note (Signed)
Patient contacted for follow-up of BMET performed at visit on 04/16/2023  Shared with patient that BMET was all WNL and Potassium was stable.   Current Medications include: eplerenone 50mg  daily Patient denies any significant medication related side effects.  Medication Plan: -dosage change  Increased eplereone 50mg  from daily to 50mg  BID Patient verbalized undersanding of treatment plan.

## 2023-04-18 NOTE — Telephone Encounter (Signed)
Patient contacted for follow-up of BMET performed at visit on 04/16/2023  Shared with patient that BMET was all WNL and Potassium was stable.   Current Medications include: eplerenone 50mg  daily Patient denies any significant medication related side effects.  Medication Plan: -dosage change    Increased eplereone 50mg  from daily to 50mg  BID Patient verbalized undersanding of treatment plan.   Patient has adequate supply   Total time with patient call and documentation of interaction: 14 minutes.  F/U visit scheduled with PCP on 12/17/20204 and BP follow-up 05/22/2022 in pharmacist clinic

## 2023-04-19 NOTE — Telephone Encounter (Signed)
Reviewed and agree with Dr Koval's plan.   

## 2023-04-30 ENCOUNTER — Telehealth: Payer: Medicaid Other | Admitting: Family Medicine

## 2023-04-30 DIAGNOSIS — E6609 Other obesity due to excess calories: Secondary | ICD-10-CM

## 2023-04-30 DIAGNOSIS — I10 Essential (primary) hypertension: Secondary | ICD-10-CM

## 2023-04-30 NOTE — Progress Notes (Signed)
Attempted to call patient x 2 for visit with no answer.  Started video visit but patient was not present.

## 2023-05-15 DIAGNOSIS — I639 Cerebral infarction, unspecified: Secondary | ICD-10-CM

## 2023-05-15 DIAGNOSIS — Z419 Encounter for procedure for purposes other than remedying health state, unspecified: Secondary | ICD-10-CM | POA: Diagnosis not present

## 2023-05-15 HISTORY — DX: Cerebral infarction, unspecified: I63.9

## 2023-05-18 ENCOUNTER — Ambulatory Visit
Admission: EM | Admit: 2023-05-18 | Discharge: 2023-05-18 | Disposition: A | Discharge status: Home / Self Care | Payer: Medicaid Other | Attending: Family Medicine | Admitting: Family Medicine

## 2023-05-18 DIAGNOSIS — B349 Viral infection, unspecified: Secondary | ICD-10-CM | POA: Diagnosis not present

## 2023-05-18 LAB — POCT INFLUENZA A/B
Influenza A, POC: NEGATIVE
Influenza B, POC: NEGATIVE

## 2023-05-18 MED ORDER — ACETAMINOPHEN 325 MG PO TABS: 650.0000 mg | ORAL_TABLET | Freq: Four times a day (QID) | ORAL | 0 refills | Status: DC | PRN

## 2023-05-18 MED ORDER — IBUPROFEN 800 MG PO TABS
800.0000 mg | ORAL_TABLET | Freq: Once | ORAL | Status: AC
  Administered 2023-05-18: 800 mg via ORAL

## 2023-05-18 MED ORDER — CETIRIZINE HCL 10 MG PO TABS: 10.0000 mg | ORAL_TABLET | Freq: Every day | ORAL | 0 refills | Status: DC

## 2023-05-18 MED ORDER — ONDANSETRON HCL 4 MG/2ML IJ SOLN
4.0000 mg | Freq: Once | INTRAMUSCULAR | Status: AC
  Administered 2023-05-18: 4 mg via INTRAMUSCULAR

## 2023-05-18 MED ORDER — ONDANSETRON 8 MG PO TBDP: 8.0000 mg | ORAL_TABLET | Freq: Three times a day (TID) | ORAL | 0 refills | Status: DC | PRN

## 2023-05-18 MED ORDER — ACETAMINOPHEN 325 MG PO TABS
650.0000 mg | ORAL_TABLET | Freq: Once | ORAL | Status: AC
  Administered 2023-05-18: 650 mg via ORAL

## 2023-05-18 MED ORDER — LOPERAMIDE HCL 2 MG PO CAPS: 2.0000 mg | ORAL_CAPSULE | Freq: Two times a day (BID) | ORAL | 0 refills | Status: DC | PRN

## 2023-05-18 NOTE — ED Triage Notes (Signed)
 Patient presents with vomiting, diarrhea, headache, stomach ache and right eye was closed shut when she woke up. Symptoms started on Tuesday. Treated with Theraflu, Ibuprofen and cough drops.

## 2023-05-18 NOTE — ED Provider Notes (Signed)
 [facility]  Note:  This document was prepared using Conservation officer, historic buildings and may include unintentional dictation errors.  MRN: [id number] DOB: [birth year]  Subjective:   Susan Sanders is a 53 y.o. female presenting for 4-day history of acute onset persistent malaise, fatigue, nausea, vomiting, diarrhea, headaches, body pains.  Has used multiple over-the-counter medication with minimal relief.  No overt chest pain, shortness of breath or wheezing.  No fever, bloody stools, recent antibiotic use, hospitalizations or long distance travel.  Has not eaten raw foods, drank unfiltered water .  No history of GI disorders including Crohn's, IBS, ulcerative colitis.   No current facility-administered medications for this encounter.  Current Outpatient Medications:    acetaminophen  (TYLENOL ) 325 MG tablet, Take 2 tablets (650 mg total) by mouth every 6 (six) hours as needed., Disp: 36 tablet, Rfl: 0   amLODipine  (NORVASC ) 10 MG tablet, Take 1 tablet (10 mg total) by mouth at bedtime., Disp: 90 tablet, Rfl: 3   cetirizine  (ZYRTEC ) 10 MG tablet, Take 1 tablet (10 mg total) by mouth daily., Disp: 30 tablet, Rfl: 0   cyclobenzaprine  (FLEXERIL ) 10 MG tablet, Take 1 tablet (10 mg total) by mouth 2 (two) times daily as needed for muscle spasms., Disp: 20 tablet, Rfl: 0   eplerenone  (INSPRA ) 50 MG tablet, Take 1 tablet (50 mg total) by mouth 2 (two) times daily., Disp: , Rfl:    ferrous sulfate  325 (65 FE) MG tablet, Take 325 mg by mouth daily with breakfast., Disp: , Rfl:    hydrOXYzine  (VISTARIL ) 25 MG capsule, Take 1 capsule (25 mg total) by mouth 3 (three) times daily as needed for anxiety., Disp: 30 capsule, Rfl: 1   lidocaine  (LIDODERM ) 5 %, Place 1 patch onto the skin daily. Remove & Discard patch within 12 hours or as directed by MD, Disp: 30 patch, Rfl: 0   loperamide  (IMODIUM  A-D) 2 MG tablet, Take 1 tablet (2 mg total) by mouth 4 (four) times daily as needed for  diarrhea or loose stools., Disp: 24 tablet, Rfl: 0   losartan -hydrochlorothiazide  (HYZAAR) 100-12.5 MG tablet, Take 1 tablet by mouth daily., Disp: 30 tablet, Rfl: 1   magnesium  oxide (MAG-OX) 400 MG tablet, Take 1 tablet (400 mg total) by mouth daily as needed (headaches)., Disp: 30 tablet, Rfl: 3   MULTIPLE VITAMINS PO, Take by mouth., Disp: , Rfl:    ondansetron  (ZOFRAN -ODT) 4 MG disintegrating tablet, Take 1 tablet (4 mg total) by mouth every 8 (eight) hours as needed for nausea or vomiting., Disp: 12 tablet, Rfl: 0   Semaglutide -Weight Management (WEGOVY ) 1 MG/0.5ML SOAJ, Inject 1 mg into the skin once a week., Disp: 2 mL, Rfl: 2   sertraline  (ZOLOFT ) 50 MG tablet, Take 1 tablet (50 mg total) by mouth daily., Disp: 30 tablet, Rfl: 3   Allergies  Allergen Reactions   Amoxicillin Shortness Of Breath, Itching and Swelling    Has patient had a PCN reaction causing immediate rash, facial/tongue/throat swelling, SOB or lightheadedness with hypotension: Yes Has patient had a PCN reaction causing severe rash involving mucus membranes or skin necrosis: Yes Has patient had a PCN reaction that required hospitalization: Yes Has patient had a PCN reaction occurring within the last 10 years: No If all of the above answers are NO, then may proceed with Cephalosporin use.    Latex Shortness Of Breath    Itching and burns skin   Naproxen  Hives and Itching    Takes IBU without difficulty  Tramadol  Hives, Itching and Other (See Comments)    Dysphoria - visual disturbance, hot sensatin    Past Medical History:  Diagnosis Date   Allergy    Anemia    Anxiety    Cellulitis    Chest pain    Childhood asthma    Chronic back pain    all over (05/29/2018)   Complication of anesthesia    difficult waking up  (03/30/2019)   Depression    Eczema    Hypertension    Scoliosis      Past Surgical History:  Procedure Laterality Date   ABDOMINAL HYSTERECTOMY     BREAST SURGERY     breast  biopsy-right breast   HYSTERECTOMY ABDOMINAL WITH SALPINGO-OOPHORECTOMY Bilateral 07/21/2018   Procedure: TOTAL HYSTERECTOMY ABDOMINAL, BILATERAL SALPINGO-OOPHORECTOMY;  Surgeon: Rockney Evalene SQUIBB, MD;  Location: [facility];  Service: Gynecology;  Laterality: Bilateral;   TUBAL LIGATION      Family History  Problem Relation Age of Onset   Hypertension Mother    Bladder Cancer Mother    Colon cancer Neg Hx    Esophageal cancer Neg Hx    Stomach cancer Neg Hx    Rectal cancer Neg Hx     Social History   Tobacco Use   Smoking status: Former    Current packs/day: 0.00    Average packs/day: 0.5 packs/day for 28.0 years (14.0 ttl pk-yrs)    Types: Cigarettes    Start date: 09/20/1982    Quit date: 09/20/2010    Years since quitting: 12.6   Smokeless tobacco: Never  Vaping Use   Vaping status: Never Used  Substance Use Topics   Alcohol use: Yes    Alcohol/week: 2.0 standard drinks of alcohol    Types: 2 Cans of beer per week   Drug use: Not Currently    Types: Marijuana    ROS   Objective:   Vitals: BP (!) 150/93 (BP Location: Left Arm)   Pulse 86   Temp 99 F (37.2 C) (Oral)   Resp 18   Ht 5' 5 (1.651 m)   Wt 160 lb (72.6 kg)   LMP 06/24/2018   SpO2 99%   BMI 26.63 kg/m   Physical Exam Constitutional:      General: She is not in acute distress.    Appearance: Normal appearance. She is well-developed and normal weight. She is not ill-appearing, toxic-appearing or diaphoretic.  HENT:     Head: Normocephalic and atraumatic.     Right Ear: Tympanic membrane, ear canal and external ear normal. No drainage or tenderness. No middle ear effusion. There is no impacted cerumen. Tympanic membrane is not erythematous or bulging.     Left Ear: Tympanic membrane, ear canal and external ear normal. No drainage or tenderness.  No middle ear effusion. There is no impacted cerumen. Tympanic membrane is not erythematous or bulging.     Nose: Nose normal. No  congestion or rhinorrhea.     Mouth/Throat:     Mouth: Mucous membranes are moist. No oral lesions.     Pharynx: No pharyngeal swelling, oropharyngeal exudate, posterior oropharyngeal erythema or uvula swelling.     Tonsils: No tonsillar exudate or tonsillar abscesses.  Eyes:     General: No scleral icterus.       Right eye: No discharge.        Left eye: No discharge.     Extraocular Movements: Extraocular movements intact.     Right eye: Normal extraocular motion.  Left eye: Normal extraocular motion.     Conjunctiva/sclera: Conjunctivae normal.  Cardiovascular:     Rate and Rhythm: Normal rate and regular rhythm.     Heart sounds: Normal heart sounds. No murmur heard.    No friction rub. No gallop.  Pulmonary:     Effort: Pulmonary effort is normal. No respiratory distress.     Breath sounds: No stridor. No wheezing, rhonchi or rales.  Chest:     Chest wall: No tenderness.  Abdominal:     General: Bowel sounds are normal. There is no distension.     Palpations: Abdomen is soft. There is no mass.     Tenderness: There is no abdominal tenderness. There is no right CVA tenderness, left CVA tenderness, guarding or rebound.  Musculoskeletal:     Cervical back: Normal range of motion and neck supple.  Lymphadenopathy:     Cervical: No cervical adenopathy.  Skin:    General: Skin is warm and dry.  Neurological:     General: No focal deficit present.     Mental Status: She is alert and oriented to person, place, and time.     Cranial Nerves: No cranial nerve deficit.     Motor: No weakness.     Coordination: Coordination normal.     Gait: Gait normal.  Psychiatric:        Mood and Affect: Mood normal.        Behavior: Behavior normal.        Thought Content: Thought content normal.        Judgment: Judgment normal.     Results for orders placed or performed during the hospital encounter of 05/18/23 (from the past 24 hours)  POCT Influenza A/B     Status: None    Collection Time: 05/18/23  1:36 PM  Result Value Ref Range   Influenza A, POC Negative Negative   Influenza B, POC Negative Negative   IM Zofran  4 mg administered in clinic.  P.o. Tylenol  and ibuprofen  administered in clinic.  Assessment and Plan :   PDMP not reviewed this encounter.  1. Acute viral syndrome    Will manage for suspected viral gastroenteritis, an acute viral syndrome with supportive care.  Recommended patient hydrate well, eat light meals and maintain electrolytes.  Will use general supportive care, Zofran  and Imodium  for nausea, vomiting and diarrhea.  Low suspicion for an acute abdomen.  Counseled patient on potential for adverse effects with medications prescribed/recommended today, ER and return-to-clinic precautions discussed, patient verbalized understanding.    Christopher Savannah, PA-C 05/18/23 1420

## 2023-05-18 NOTE — Discharge Instructions (Addendum)
 We will manage this as a viral illness, viral syndrome. For sore throat or cough try using a honey-based tea. Use 3 teaspoons of honey with juice squeezed from half lemon. Place shaved pieces of ginger into 1/2-1 cup of water  and warm over stove top. Then mix the ingredients and repeat every 4 hours as needed. Please take ibuprofen  600mg  every 6 hours with food alternating with OR taken together with Tylenol  500mg -650mg  every 6 hours for throat pain, fevers, aches and pains. Hydrate very well with at least 2 liters of water . Eat light meals such as soups (chicken and noodles, vegetable, chicken and wild rice).  Do not eat foods that you are allergic to.  Taking an antihistamine like Zyrtec  (10mg  daily) can help against postnasal drainage, sinus congestion which can cause sinus pain, sinus headaches, throat pain, painful swallowing, coughing.  You can take this together with pseudoephedrine  (Sudafed) at a dose of 30-60 mg 3 times a day or twice daily as needed for the same kind of nasal drip, congestion.  Make sure you push fluids drinking mostly water  but mix it with Gatorade.  Try to eat light meals including soups, broths and soft foods, fruits.  You may use Zofran  for your nausea and vomiting once every 8 hours.  Imodium  can help with diarrhea but use this carefully limiting it to 1-2 times per day only if you are having a lot of diarrhea.  Please return to the clinic if symptoms worsen or you start having severe abdominal pain not helped by taking Tylenol  or start having bloody stools or blood in the vomit.

## 2023-05-23 ENCOUNTER — Ambulatory Visit: Payer: Medicaid Other | Admitting: Pharmacist

## 2023-06-05 ENCOUNTER — Other Ambulatory Visit (INDEPENDENT_AMBULATORY_CARE_PROVIDER_SITE_OTHER): Payer: Medicaid Other | Admitting: Pharmacist

## 2023-06-05 DIAGNOSIS — I10 Essential (primary) hypertension: Secondary | ICD-10-CM

## 2023-06-05 NOTE — Patient Instructions (Signed)
Lab work 1/29  Visit 1/30

## 2023-06-05 NOTE — Progress Notes (Signed)
Patient contacted for follow-up of missed appointment for lab follow-up  Since last contact patient reports home blood pressure is doing well.   Current Medications include: eplerenone, amlodipine, losartan-hctz Needs BMET for potassium reevaluation.    Lab visit set-up 1/29  Pharmacist visit set-up with me on 1/30  Review blood pressure and lab results 1/30 Total time with patient call and documentation of interaction: 9 minutes.  F/U Phone call planned: None

## 2023-06-06 NOTE — Progress Notes (Signed)
Reviewed and agree with Dr Koval's plan.   

## 2023-06-12 ENCOUNTER — Other Ambulatory Visit: Payer: Medicaid Other

## 2023-06-13 ENCOUNTER — Encounter: Payer: Self-pay | Admitting: Pharmacist

## 2023-06-13 ENCOUNTER — Ambulatory Visit (INDEPENDENT_AMBULATORY_CARE_PROVIDER_SITE_OTHER): Payer: Medicaid Other | Admitting: Pharmacist

## 2023-06-13 ENCOUNTER — Other Ambulatory Visit: Payer: Medicaid Other

## 2023-06-13 VITALS — BP 136/67 | HR 75 | Wt 202.0 lb

## 2023-06-13 DIAGNOSIS — Z6835 Body mass index (BMI) 35.0-35.9, adult: Secondary | ICD-10-CM | POA: Diagnosis not present

## 2023-06-13 DIAGNOSIS — I10 Essential (primary) hypertension: Secondary | ICD-10-CM | POA: Diagnosis not present

## 2023-06-13 DIAGNOSIS — E66812 Obesity, class 2: Secondary | ICD-10-CM

## 2023-06-13 MED ORDER — OLMESARTAN-AMLODIPINE-HCTZ 40-10-12.5 MG PO TABS: 1.0000 | ORAL_TABLET | Freq: Every day | ORAL | 11 refills | Status: DC

## 2023-06-13 NOTE — Assessment & Plan Note (Addendum)
Weight management - lost 9 pounds since last visit.  (Note - Error in ER weight entry 05/18/2023) She was congratulated on loss from 211 lbs to 202 lbs. She is doing very well with adjustment of diet.  She is consistent with exercise plan and was encouraged to continue current successful efforts.  She established a weight goal of < 180 lbs during 2025.  -tolerating wegovy (semaglutide) 1mg  - continue.

## 2023-06-13 NOTE — Patient Instructions (Addendum)
It was nice to see you today!  Your goal blood pressure is <130/80 mmHg  -  You are very close to meeting our goal! Keep up the great work with your progress on losing weight.   Medication Changes: STOP  -  amlodipine  STOP - losartan / HCTZ  START - amlodipine/olmesartan/hydrochlorothiazide - 1 pill once daily.   Continue all other medication the same.   Today we are getting lab work.  I   Keep up the good work with diet and exercise. Aim for a diet full of vegetables, fruit and lean meats (chicken, [place], fish). Try to limit salt intake by eating fresh or frozen vegetables (instead of canned), rinse canned vegetables prior to cooking and do not add any additional salt to meals.   Please plan to come in for follow-up in March/April.

## 2023-06-13 NOTE — Assessment & Plan Note (Signed)
Hypertension longstanding with good control on current four drug medication regimen. BP goal < 130/80 mmHg. Medication adherence appears good. 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  - BMET today - For medication minimization of pill burden will change to 3 in 1 combination - STOP - amlodipine  - STOP - losartan / HCTZ - START - amlodipine/olmesartan/hydrochlorothiazide - 1 pill once daily.  -Patient educated on purpose, proper use, and potential adverse effects.  -Encouraged patient to check BP at home. - Lipid panel last completed 2015 - repeat today in this 53 yo with hypertension (LDL was > 100 at that time).

## 2023-06-13 NOTE — Progress Notes (Signed)
Reviewed and agree with Dr Macky Lower plan.

## 2023-06-13 NOTE — Progress Notes (Signed)
S:     Chief Complaint  Patient presents with   Medication Management    Blood Pressure Follow-up   53 y.o. female who presents for hypertension evaluation, education, and management.  PMH is significant for hypertension, headaches, cramps, and anxiety.   Patient was referred and last seen by Primary Care Provider, Dr. Marsh Dolly, on 04/08/2023.   At last visit, patient was tolerating all therapies and reported fewer headaches and leg cramps.    Today, patient arrives in good spirits and presents without assistance.  She is accompanied by her daughter, Pryor Montes.   Denies dizziness, headache, blurred vision, swelling.   Medication adherence good . Patient reports taking BP medications today.   Current antihypertensives include: amlodipine 10mg , Losartan 100mg , hydrochlorothiazide 12.5mg  and eplerenone 50mg  BID.    Reported home BP readings: 111-150/62-78  Patient-reported exercise habits: increased activity with walking program   O:  Review of Systems  Constitutional:  Negative for malaise/fatigue.  Musculoskeletal:  Positive for back pain, myalgias and neck pain.  Neurological:  Negative for dizziness and headaches.  Psychiatric/Behavioral:  The patient is not nervous/anxious.   All other systems reviewed and are negative.   Physical Exam Vitals reviewed.  Constitutional:      Appearance: Normal appearance.  Pulmonary:     Effort: Pulmonary effort is normal.  Neurological:     Mental Status: She is alert. Mental status is at baseline.  Psychiatric:        Mood and Affect: Mood normal.        Behavior: Behavior normal.        Thought Content: Thought content normal.        Judgment: Judgment normal.     Last 3 Office BP readings: BP Readings from Last 3 Encounters:  06/13/23 136/67  05/18/23 (!) 150/93  04/16/23 (!) 155/71    BMET    Component Value Date/Time   NA 141 04/16/2023 1528   K 3.8 04/16/2023 1528   CL 101 04/16/2023 1528   CO2 23 04/16/2023  1528   GLUCOSE 81 04/16/2023 1528   GLUCOSE 77 12/20/2022 1630   BUN 10 04/16/2023 1528   CREATININE 0.80 04/16/2023 1528   CREATININE 0.74 07/14/2015 1658   CALCIUM 9.7 04/16/2023 1528   GFRNONAA >60 12/20/2022 1630   GFRNONAA >89 07/14/2015 1658   GFRAA 96 01/04/2020 0943   GFRAA >89 07/14/2015 1658     Patient is participating in a Managed Medicaid Plan:  Yes    A/P: Hypertension longstanding with good control on current four drug medication regimen. BP goal < 130/80 mmHg. Medication adherence appears good. 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  - BMET today - For medication minimization of pill burden will change to 3 in 1 combination - STOP - amlodipine  - STOP - losartan / HCTZ - START - amlodipine/olmesartan/hydrochlorothiazide - 1 pill once daily.  -Patient educated on purpose, proper use, and potential adverse effects.  -Encouraged patient to check BP at home. - Lipid panel last completed 2015 - repeat today in this 54 yo with hypertension (LDL was > 100 at that time).   Weight management - lost 9 pounds since last visit.  (Note - Error in ER weight entry 05/18/2023) She was congratulated on loss from 211 lbs to 202 lbs. She is doing very well with adjustment of diet.  She is consistent with exercise plan and was encouraged to continue current successful efforts.  She established a  weight goal of < 180 lbs during 2025.  -tolerating wegovy (semaglutide) 1mg  - continue.   Results reviewed and written information provided.    Written patient instructions provided. Patient verbalized understanding of treatment plan.  Total time in face to face counseling 21 minutes.    Follow-up:  Pharmacist PRN - none planned. PCP clinic visit in March or April 2025 - asked patient to schedule future visit.

## 2023-06-14 LAB — BASIC METABOLIC PANEL
BUN/Creatinine Ratio: 11 (ref 9–23)
BUN: 10 mg/dL (ref 6–24)
CO2: 21 mmol/L (ref 20–29)
Calcium: 10 mg/dL (ref 8.7–10.2)
Chloride: 100 mmol/L (ref 96–106)
Creatinine, Ser: 0.91 mg/dL (ref 0.57–1.00)
Glucose: 82 mg/dL (ref 70–99)
Potassium: 3.9 mmol/L (ref 3.5–5.2)
Sodium: 140 mmol/L (ref 134–144)
eGFR: 75 mL/min/{1.73_m2} (ref >59)

## 2023-06-14 LAB — LIPID PANEL
Chol/HDL Ratio: 3.1 {ratio} (ref 0.0–4.4)
Cholesterol, Total: 200 mg/dL — ABNORMAL HIGH (ref 100–199)
HDL: 64 mg/dL (ref >39)
LDL Chol Calc (NIH): 123 mg/dL — ABNORMAL HIGH (ref 0–99)
Triglycerides: 74 mg/dL (ref 0–149)
VLDL Cholesterol Cal: 13 mg/dL (ref 5–40)

## 2023-06-15 DIAGNOSIS — Z419 Encounter for procedure for purposes other than remedying health state, unspecified: Secondary | ICD-10-CM | POA: Diagnosis not present

## 2023-06-19 ENCOUNTER — Telehealth: Payer: Self-pay | Admitting: Pharmacist

## 2023-06-19 NOTE — Telephone Encounter (Signed)
 Patient contacted for follow-up of labs BMET - normal Lipid panel LDL 132  Total Chol 200   Since last contact patient reports doing well.  Shared that her electrolytes were normal and that her cholesterol panel results can be discussed at upcoming PCP visit.   Medication Plan: -no change    Total time with patient call and documentation of interaction: 8 minutes.

## 2023-06-19 NOTE — Telephone Encounter (Signed)
 Reviewed and agree with Dr Macky Lower plan.

## 2023-07-12 ENCOUNTER — Ambulatory Visit (INDEPENDENT_AMBULATORY_CARE_PROVIDER_SITE_OTHER): Payer: Medicaid Other | Admitting: Student

## 2023-07-12 VITALS — BP 136/78 | Temp 98.6°F | Wt 195.6 lb

## 2023-07-12 DIAGNOSIS — R1084 Generalized abdominal pain: Secondary | ICD-10-CM

## 2023-07-12 DIAGNOSIS — R509 Fever, unspecified: Secondary | ICD-10-CM | POA: Diagnosis not present

## 2023-07-12 DIAGNOSIS — R1110 Vomiting, unspecified: Secondary | ICD-10-CM | POA: Insufficient documentation

## 2023-07-12 DIAGNOSIS — R112 Nausea with vomiting, unspecified: Secondary | ICD-10-CM

## 2023-07-12 LAB — POC SOFIA 2 FLU + SARS ANTIGEN FIA
Influenza A, POC: NEGATIVE
Influenza B, POC: NEGATIVE
SARS Coronavirus 2 Ag: NEGATIVE

## 2023-07-12 MED ORDER — ONDANSETRON HCL 4 MG PO TABS: 4.0000 mg | ORAL_TABLET | Freq: Three times a day (TID) | ORAL | 0 refills | Status: DC | PRN

## 2023-07-12 NOTE — Patient Instructions (Addendum)
 It was great seeing you today.  You likely have a viral gastroenteritis.  As we discussed, -We are getting blood work today.  I will call you when it returns. -I sent nausea medicine to your pharmacy.  You may take this every 8 hours as needed for nausea or vomiting. -If you become confused, sleepier than usual, or your abdominal pain worsens, or if you are vomiting does not stop with medication, you need to go to the emergency department   If you have any questions or concerns, please feel free to call the clinic.   Have a wonderful day,  Dr. Darral Dash [facility] [phone]

## 2023-07-12 NOTE — Assessment & Plan Note (Signed)
 With abdominal pain and diarrhea. Concerning for viral gastroenteritis.  Although also question if this could be a developing pancreatitis given GLP-1 use. Although patient is tired and somewhat uncomfortable appearing, she is generally well-hydrated and nontoxic and able to provide history today. Using shared decision making, patient will opt for outpatient labs, rest, Zofran as needed for nausea/vomiting, close monitoring of fluid intake and urine output. CBC, CMP, lipase today. She is to go to the ER if she has any change in mentation, worsening abdominal pain, decreased urine output or other signs of dehydration or toxicity.

## 2023-07-12 NOTE — Progress Notes (Signed)
    SUBJECTIVE:   CHIEF COMPLAINT / HPI:   Susan Sanders is a 53 year old female here with 5 days of abdominal pain, vomiting, and diarrhea.  She says she has been vomiting for 2 days.  Started feeling ill about 5 days ago. Unable to tolerate food. Keeping down some fluids, was able to have 1/2 cup of ginger ale this morning. 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  Stopped taking her Wegovy 1 week ago.  She has been on this medication for a couple of months.  A few family members are also ill with similar symptoms after eating [employer].  No fever.  No blood in urine or stool.  PERTINENT  PMH / PSH: Hypertension GERD   OBJECTIVE:   BP 136/78   Temp 98.6 F (37 C)   Wt 195 lb 9.6 oz (88.7 kg)   LMP 06/24/2018   SpO2 98%   BMI 32.55 kg/m  General: Tired and somewhat uncomfortable appearing, but awake and alert and able answer questions appropriately HEENT: Mucous membranes moist.  No oropharyngeal erythema. Cardiac: Regular rate and rhythm Respiratory: Normal effort on room air with no wheezing or crackles on auscultation Abdomen: Obese, soft, mild generalized tenderness to palpation, particularly in the lower abdomen.  Nondistended.  Normal active bowel sounds. Extremities: Warm and well-perfused. Neuro: Speech clear and fluent.  A&O x 4.   ASSESSMENT/PLAN:   Vomiting With abdominal pain and diarrhea. Concerning for viral gastroenteritis.  Although also question if this could be a developing pancreatitis given GLP-1 use. Although patient is tired and somewhat uncomfortable appearing, she is generally well-hydrated and nontoxic and able to provide history today. Using shared decision making, patient will opt for outpatient labs, rest, Zofran as needed for nausea/vomiting, close monitoring of fluid intake and urine output. CBC, CMP, lipase today. She is to go to the ER if she has any change in mentation, worsening abdominal  pain, decreased urine output or other signs of dehydration or toxicity.     Darral Dash, DO [facility]

## 2023-07-13 DIAGNOSIS — Z419 Encounter for procedure for purposes other than remedying health state, unspecified: Secondary | ICD-10-CM | POA: Diagnosis not present

## 2023-07-13 LAB — CBC
Hematocrit: 38.4 % (ref 34.0–46.6)
Hemoglobin: 12.4 g/dL (ref 11.1–15.9)
MCH: 25.7 pg — ABNORMAL LOW (ref 26.6–33.0)
MCHC: 32.3 g/dL (ref 31.5–35.7)
MCV: 80 fL (ref 79–97)
Platelets: 453 10*3/uL — ABNORMAL HIGH (ref 150–450)
RBC: 4.82 x10E6/uL (ref 3.77–5.28)
RDW: 14.2 % (ref 11.7–15.4)
WBC: 6.1 10*3/uL (ref 3.4–10.8)

## 2023-07-13 LAB — COMPREHENSIVE METABOLIC PANEL
ALT: 10 IU/L (ref 0–32)
AST: 15 IU/L (ref 0–40)
Albumin: 4.2 g/dL (ref 3.8–4.9)
Alkaline Phosphatase: 71 IU/L (ref 44–121)
BUN/Creatinine Ratio: 10 (ref 9–23)
BUN: 8 mg/dL (ref 6–24)
Bilirubin Total: 0.3 mg/dL (ref 0.0–1.2)
CO2: 20 mmol/L (ref 20–29)
Calcium: 9.3 mg/dL (ref 8.7–10.2)
Chloride: 102 mmol/L (ref 96–106)
Creatinine, Ser: 0.83 mg/dL (ref 0.57–1.00)
Globulin, Total: 2.6 g/dL (ref 1.5–4.5)
Glucose: 93 mg/dL (ref 70–99)
Potassium: 3.9 mmol/L (ref 3.5–5.2)
Sodium: 139 mmol/L (ref 134–144)
Total Protein: 6.8 g/dL (ref 6.0–8.5)
eGFR: 84 mL/min/{1.73_m2} (ref >59)

## 2023-07-13 LAB — LIPASE: Lipase: 33 U/L (ref 14–72)

## 2023-07-15 ENCOUNTER — Encounter: Payer: Self-pay | Admitting: Student

## 2023-08-07 ENCOUNTER — Other Ambulatory Visit: Payer: Self-pay | Admitting: Family Medicine

## 2023-08-09 NOTE — Telephone Encounter (Signed)
 Opened in error

## 2023-08-24 DIAGNOSIS — Z419 Encounter for procedure for purposes other than remedying health state, unspecified: Secondary | ICD-10-CM | POA: Diagnosis not present

## 2023-09-05 ENCOUNTER — Other Ambulatory Visit: Payer: Self-pay | Admitting: Family Medicine

## 2023-09-05 DIAGNOSIS — Z1231 Encounter for screening mammogram for malignant neoplasm of breast: Secondary | ICD-10-CM

## 2023-09-20 ENCOUNTER — Ambulatory Visit
Admission: RE | Admit: 2023-09-20 | Discharge: 2023-09-20 | Disposition: A | Discharge status: Home / Self Care | Source: Ambulatory Visit | Attending: Family Medicine | Admitting: Family Medicine

## 2023-09-20 DIAGNOSIS — Z1231 Encounter for screening mammogram for malignant neoplasm of breast: Secondary | ICD-10-CM

## 2023-09-23 DIAGNOSIS — Z419 Encounter for procedure for purposes other than remedying health state, unspecified: Secondary | ICD-10-CM | POA: Diagnosis not present

## 2023-10-09 ENCOUNTER — Emergency Department (HOSPITAL_COMMUNITY)

## 2023-10-09 ENCOUNTER — Inpatient Hospital Stay (HOSPITAL_COMMUNITY)
Admission: EM | Admit: 2023-10-09 | Discharge: 2023-10-10 | DRG: 065 | Disposition: A | Discharge status: Home / Self Care | Attending: Internal Medicine | Admitting: Internal Medicine

## 2023-10-09 ENCOUNTER — Other Ambulatory Visit: Payer: Self-pay

## 2023-10-09 DIAGNOSIS — Z6833 Body mass index (BMI) 33.0-33.9, adult: Secondary | ICD-10-CM | POA: Diagnosis not present

## 2023-10-09 DIAGNOSIS — E669 Obesity, unspecified: Secondary | ICD-10-CM | POA: Diagnosis not present

## 2023-10-09 DIAGNOSIS — Z7982 Long term (current) use of aspirin: Secondary | ICD-10-CM | POA: Diagnosis not present

## 2023-10-09 DIAGNOSIS — R0602 Shortness of breath: Secondary | ICD-10-CM | POA: Diagnosis not present

## 2023-10-09 DIAGNOSIS — R29700 NIHSS score 0: Secondary | ICD-10-CM | POA: Diagnosis present

## 2023-10-09 DIAGNOSIS — I160 Hypertensive urgency: Principal | ICD-10-CM | POA: Diagnosis present

## 2023-10-09 DIAGNOSIS — Z8052 Family history of malignant neoplasm of bladder: Secondary | ICD-10-CM | POA: Diagnosis not present

## 2023-10-09 DIAGNOSIS — J9811 Atelectasis: Secondary | ICD-10-CM | POA: Diagnosis not present

## 2023-10-09 DIAGNOSIS — R29703 NIHSS score 3: Secondary | ICD-10-CM | POA: Diagnosis not present

## 2023-10-09 DIAGNOSIS — R531 Weakness: Secondary | ICD-10-CM

## 2023-10-09 DIAGNOSIS — I10 Essential (primary) hypertension: Secondary | ICD-10-CM | POA: Diagnosis not present

## 2023-10-09 DIAGNOSIS — E049 Nontoxic goiter, unspecified: Secondary | ICD-10-CM | POA: Diagnosis not present

## 2023-10-09 DIAGNOSIS — I6350 Cerebral infarction due to unspecified occlusion or stenosis of unspecified cerebral artery: Principal | ICD-10-CM | POA: Diagnosis present

## 2023-10-09 DIAGNOSIS — Z1152 Encounter for screening for COVID-19: Secondary | ICD-10-CM

## 2023-10-09 DIAGNOSIS — Z803 Family history of malignant neoplasm of breast: Secondary | ICD-10-CM

## 2023-10-09 DIAGNOSIS — R29898 Other symptoms and signs involving the musculoskeletal system: Secondary | ICD-10-CM

## 2023-10-09 DIAGNOSIS — M549 Dorsalgia, unspecified: Secondary | ICD-10-CM | POA: Diagnosis not present

## 2023-10-09 DIAGNOSIS — Z87891 Personal history of nicotine dependence: Secondary | ICD-10-CM

## 2023-10-09 DIAGNOSIS — Z79899 Other long term (current) drug therapy: Secondary | ICD-10-CM | POA: Diagnosis not present

## 2023-10-09 DIAGNOSIS — Z91040 Latex allergy status: Secondary | ICD-10-CM

## 2023-10-09 DIAGNOSIS — G8191 Hemiplegia, unspecified affecting right dominant side: Secondary | ICD-10-CM | POA: Diagnosis present

## 2023-10-09 DIAGNOSIS — I639 Cerebral infarction, unspecified: Secondary | ICD-10-CM

## 2023-10-09 DIAGNOSIS — I5021 Acute systolic (congestive) heart failure: Secondary | ICD-10-CM | POA: Diagnosis not present

## 2023-10-09 DIAGNOSIS — R471 Dysarthria and anarthria: Secondary | ICD-10-CM | POA: Diagnosis present

## 2023-10-09 DIAGNOSIS — Z8249 Family history of ischemic heart disease and other diseases of the circulatory system: Secondary | ICD-10-CM

## 2023-10-09 DIAGNOSIS — R918 Other nonspecific abnormal finding of lung field: Secondary | ICD-10-CM | POA: Diagnosis not present

## 2023-10-09 DIAGNOSIS — Z880 Allergy status to penicillin: Secondary | ICD-10-CM

## 2023-10-09 DIAGNOSIS — K5909 Other constipation: Secondary | ICD-10-CM | POA: Diagnosis not present

## 2023-10-09 DIAGNOSIS — G8929 Other chronic pain: Secondary | ICD-10-CM | POA: Diagnosis not present

## 2023-10-09 DIAGNOSIS — Z885 Allergy status to narcotic agent status: Secondary | ICD-10-CM | POA: Diagnosis not present

## 2023-10-09 DIAGNOSIS — E042 Nontoxic multinodular goiter: Secondary | ICD-10-CM | POA: Diagnosis not present

## 2023-10-09 DIAGNOSIS — I739 Peripheral vascular disease, unspecified: Secondary | ICD-10-CM | POA: Diagnosis not present

## 2023-10-09 DIAGNOSIS — R079 Chest pain, unspecified: Secondary | ICD-10-CM | POA: Diagnosis not present

## 2023-10-09 DIAGNOSIS — M545 Low back pain, unspecified: Secondary | ICD-10-CM | POA: Diagnosis not present

## 2023-10-09 DIAGNOSIS — E785 Hyperlipidemia, unspecified: Secondary | ICD-10-CM | POA: Diagnosis present

## 2023-10-09 DIAGNOSIS — Z886 Allergy status to analgesic agent status: Secondary | ICD-10-CM | POA: Diagnosis not present

## 2023-10-09 DIAGNOSIS — R29818 Other symptoms and signs involving the nervous system: Secondary | ICD-10-CM | POA: Diagnosis not present

## 2023-10-09 DIAGNOSIS — Z7902 Long term (current) use of antithrombotics/antiplatelets: Secondary | ICD-10-CM

## 2023-10-09 LAB — I-STAT CHEM 8, ED
BUN: 8 mg/dL (ref 6–20)
Calcium, Ion: 1.2 mmol/L (ref 1.15–1.40)
Chloride: 102 mmol/L (ref 98–111)
Creatinine, Ser: 0.9 mg/dL (ref 0.44–1.00)
Glucose, Bld: 98 mg/dL (ref 70–99)
HCT: 41 % (ref 36.0–46.0)
Hemoglobin: 13.9 g/dL (ref 12.0–15.0)
Potassium: 3.6 mmol/L (ref 3.5–5.1)
Sodium: 139 mmol/L (ref 135–145)
TCO2: 25 mmol/L (ref 22–32)

## 2023-10-09 LAB — CBC WITH DIFFERENTIAL/PLATELET
Abs Immature Granulocytes: 0.02 10*3/uL (ref 0.00–0.07)
Basophils Absolute: 0 10*3/uL (ref 0.0–0.1)
Basophils Relative: 0 %
Eosinophils Absolute: 0.1 10*3/uL (ref 0.0–0.5)
Eosinophils Relative: 1 %
HCT: 40.4 % (ref 36.0–46.0)
Hemoglobin: 12.5 g/dL (ref 12.0–15.0)
Immature Granulocytes: 0 %
Lymphocytes Relative: 44 %
Lymphs Abs: 3.7 10*3/uL (ref 0.7–4.0)
MCH: 25.9 pg — ABNORMAL LOW (ref 26.0–34.0)
MCHC: 30.9 g/dL (ref 30.0–36.0)
MCV: 83.6 fL (ref 80.0–100.0)
Monocytes Absolute: 0.6 10*3/uL (ref 0.1–1.0)
Monocytes Relative: 7 %
Neutro Abs: 3.9 10*3/uL (ref 1.7–7.7)
Neutrophils Relative %: 48 %
Platelets: 399 10*3/uL (ref 150–400)
RBC: 4.83 MIL/uL (ref 3.87–5.11)
RDW: 15.6 % — ABNORMAL HIGH (ref 11.5–15.5)
WBC: 8.3 10*3/uL (ref 4.0–10.5)
nRBC: 0 % (ref 0.0–0.2)

## 2023-10-09 LAB — COMPREHENSIVE METABOLIC PANEL WITH GFR
ALT: 17 U/L (ref 0–44)
AST: 18 U/L (ref 15–41)
Albumin: 4.3 g/dL (ref 3.5–5.0)
Alkaline Phosphatase: 68 U/L (ref 38–126)
Anion gap: 8 (ref 5–15)
BUN: 10 mg/dL (ref 6–20)
CO2: 26 mmol/L (ref 22–32)
Calcium: 9.3 mg/dL (ref 8.9–10.3)
Chloride: 102 mmol/L (ref 98–111)
Creatinine, Ser: 0.82 mg/dL (ref 0.44–1.00)
GFR, Estimated: >60 mL/min (ref >60)
Glucose, Bld: 103 mg/dL — ABNORMAL HIGH (ref 70–99)
Potassium: 3.5 mmol/L (ref 3.5–5.1)
Sodium: 136 mmol/L (ref 135–145)
Total Bilirubin: 0.6 mg/dL (ref 0.0–1.2)
Total Protein: 7.8 g/dL (ref 6.5–8.1)

## 2023-10-09 LAB — RESP PANEL BY RT-PCR (RSV, FLU A&B, COVID)  RVPGX2
Influenza A by PCR: NEGATIVE
Influenza B by PCR: NEGATIVE
Resp Syncytial Virus by PCR: NEGATIVE
SARS Coronavirus 2 by RT PCR: NEGATIVE

## 2023-10-09 LAB — TROPONIN I (HIGH SENSITIVITY)
Troponin I (High Sensitivity): 4 ng/L (ref <18)
Troponin I (High Sensitivity): 5 ng/L (ref <18)

## 2023-10-09 MED ORDER — ACETAMINOPHEN 160 MG/5ML PO SOLN
650.0000 mg | ORAL | Status: DC | PRN
  Administered 2023-10-10 (×3): 650 mg
  Filled 2023-10-09 (×3): qty 20.3

## 2023-10-09 MED ORDER — OLMESARTAN-AMLODIPINE-HCTZ 40-10-12.5 MG PO TABS: 1.0000 | ORAL_TABLET | Freq: Every day | ORAL | Status: DC

## 2023-10-09 MED ORDER — IOHEXOL 350 MG/ML SOLN
175.0000 mL | Freq: Once | INTRAVENOUS | Status: AC | PRN
  Administered 2023-10-09: 175 mL via INTRAVENOUS

## 2023-10-09 MED ORDER — MORPHINE SULFATE (PF) 4 MG/ML IV SOLN
4.0000 mg | Freq: Once | INTRAVENOUS | Status: AC
  Administered 2023-10-09: 4 mg via INTRAVENOUS
  Filled 2023-10-09: qty 1

## 2023-10-09 MED ORDER — HYDRALAZINE HCL 20 MG/ML IJ SOLN
10.0000 mg | Freq: Once | INTRAMUSCULAR | Status: AC
  Administered 2023-10-09: 10 mg via INTRAVENOUS
  Filled 2023-10-09: qty 1

## 2023-10-09 MED ORDER — SODIUM CHLORIDE 0.9 % IV SOLN
INTRAVENOUS | Status: DC
Start: 2023-10-09 — End: 2023-10-10

## 2023-10-09 MED ORDER — SENNOSIDES-DOCUSATE SODIUM 8.6-50 MG PO TABS: 1.0000 | ORAL_TABLET | Freq: Every evening | ORAL | Status: DC | PRN

## 2023-10-09 MED ORDER — HYDROCHLOROTHIAZIDE 12.5 MG PO TABS
12.5000 mg | ORAL_TABLET | Freq: Every day | ORAL | Status: DC
  Administered 2023-10-10: 12.5 mg via ORAL
  Filled 2023-10-09: qty 1

## 2023-10-09 MED ORDER — ACETAMINOPHEN 650 MG RE SUPP: 650.0000 mg | RECTAL | Status: DC | PRN

## 2023-10-09 MED ORDER — ENOXAPARIN SODIUM 40 MG/0.4ML IJ SOSY
40.0000 mg | PREFILLED_SYRINGE | INTRAMUSCULAR | Status: DC
  Administered 2023-10-10: 40 mg via SUBCUTANEOUS
  Filled 2023-10-09: qty 0.4

## 2023-10-09 MED ORDER — LOSARTAN POTASSIUM-HCTZ 50-12.5 MG PO TABS: 1.0000 | ORAL_TABLET | Freq: Every day | ORAL | Status: DC

## 2023-10-09 MED ORDER — ACETAMINOPHEN 325 MG PO TABS
650.0000 mg | ORAL_TABLET | ORAL | Status: DC | PRN
  Administered 2023-10-10: 650 mg via ORAL
  Filled 2023-10-09 (×2): qty 2

## 2023-10-09 MED ORDER — IOHEXOL 350 MG/ML SOLN: 175.0000 mL | Freq: Once | INTRAVENOUS | Status: DC | PRN

## 2023-10-09 MED ORDER — SPIRONOLACTONE 25 MG PO TABS: 50.0000 mg | ORAL_TABLET | Freq: Every day | ORAL | Status: DC

## 2023-10-09 MED ORDER — STROKE: EARLY STAGES OF RECOVERY BOOK
Freq: Once | Status: AC
  Filled 2023-10-09: qty 1

## 2023-10-09 MED ORDER — LABETALOL HCL 5 MG/ML IV SOLN
10.0000 mg | Freq: Once | INTRAVENOUS | Status: AC
  Administered 2023-10-09: 10 mg via INTRAVENOUS
  Filled 2023-10-09: qty 4

## 2023-10-09 MED ORDER — IRBESARTAN 300 MG PO TABS
300.0000 mg | ORAL_TABLET | Freq: Every day | ORAL | Status: DC
  Administered 2023-10-10: 300 mg via ORAL
  Filled 2023-10-09: qty 1

## 2023-10-09 NOTE — H&P (Signed)
 History and Physical    Susan Sanders ZOX:[id number] DOB: [birth year] DOA: 10/09/2023  PCP: [place], MD   Chief Complaint: Right-sided weakness  HPI: Susan Sanders is a 53 y.o. female with medical history significant of hypertension, chronic back pain who presents emergency department due to right-sided weakness back pain.  Patient has had right arm weakness and numbness over the last 2 days.  She is having difficulty speaking and has been unable to pick up things with the right side family was concerned she may have a stroke she was brought to the ER where she was found to be profoundly hypertensive with systolics over 200.  Labs were obtained on presentation which showed WBC 8.3, hemoglobin 12.5, CMP unrevealing, troponin within normal limits.  Urinalysis was pending at time of evaluation.  Patient had chest x-ray which showed no acute findings.  CTA head and neck was normal.  CTA chest abdomen pelvis showed no evidence of dissection.  Patient was admitted for further workup.  MRI head was ordered.  On admission she had notable right-sided deficits and weakness.  She had poor coordination and sensation.  Her blood pressure improved to systolics in the 140s.   Review of Systems: Review of Systems  Constitutional:  Positive for malaise/fatigue. Negative for fever.  HENT: Negative.    Eyes: Negative.   Respiratory: Negative.    Cardiovascular: Negative.   Gastrointestinal: Negative.   Genitourinary: Negative.   Musculoskeletal: Negative.   Skin: Negative.   Neurological: Negative.   Endo/Heme/Allergies: Negative.   Psychiatric/Behavioral: Negative.    All other systems reviewed and are negative.    As per HPI otherwise 10 point review of systems negative.   Allergies  Allergen Reactions   Amoxicillin Shortness Of Breath, Itching and Swelling    Has patient had a PCN reaction causing immediate rash, facial/tongue/throat swelling, SOB or lightheadedness with hypotension:  Yes Has patient had a PCN reaction causing severe rash involving mucus membranes or skin necrosis: Yes Has patient had a PCN reaction that required hospitalization: Yes Has patient had a PCN reaction occurring within the last 10 years: No If all of the above answers are "NO", then may proceed with Cephalosporin use.    Latex Shortness Of Breath    Itching and burns skin   Naproxen  Hives and Itching    Takes IBU without difficulty   Tramadol  Hives, Itching and Other (See Comments)    Dysphoria - visual disturbance, hot sensatin    Past Medical History:  Diagnosis Date   Allergy    Anemia    Anxiety    Cellulitis    Chest pain    Childhood asthma    Chronic back pain    "all over" (05/29/2018)   Complication of anesthesia    "difficult waking up " (03/30/2019)   Depression    Eczema    Hypertension    Scoliosis     Past Surgical History:  Procedure Laterality Date   ABDOMINAL HYSTERECTOMY     BREAST SURGERY     breast biopsy-right breast   HYSTERECTOMY ABDOMINAL WITH SALPINGO-OOPHORECTOMY Bilateral 07/21/2018   Procedure: TOTAL HYSTERECTOMY ABDOMINAL, BILATERAL SALPINGO-OOPHORECTOMY;  Surgeon: Lacretia Piccolo, MD;  Location: [facility];  Service: Gynecology;  Laterality: Bilateral;   TUBAL LIGATION       reports that she quit smoking about 13 years ago. Her smoking use included cigarettes. She started smoking about 41 years ago. She has a 14 pack-year smoking history. She has never  used smokeless tobacco. She reports current alcohol use of about 2.0 standard drinks of alcohol per week. She reports that she does not currently use drugs after having used the following drugs: Marijuana.  Family History  Problem Relation Age of Onset   Hypertension Mother    Bladder Cancer Mother    Breast cancer Maternal Aunt 54   Colon cancer Neg Hx    Esophageal cancer Neg Hx    Stomach cancer Neg Hx    Rectal cancer Neg Hx     Prior to Admission medications    Medication Sig Start Date End Date Taking? Authorizing Provider  amLODipine  (NORVASC ) 10 MG tablet Take 10 mg by mouth daily. 08/07/23  Yes [provider]  losartan -hydrochlorothiazide  (HYZAAR) 50-12.5 MG tablet Take 1 tablet by mouth daily. 05/13/23   [provider]  ondansetron  (ZOFRAN ) 4 MG tablet Take 1 tablet (4 mg total) by mouth every 8 (eight) hours as needed for nausea or vomiting. 07/12/23  Yes Dameron, Marisa, DO  eplerenone  (INSPRA ) 50 MG tablet Take 1 tablet (50 mg total) by mouth 2 (two) times daily. 04/18/23   McDiarmid, Demetra Filter, MD  ferrous sulfate  325 (65 FE) MG tablet Take 325 mg by mouth daily with breakfast. Patient not taking: Reported on 10/09/2023    [provider]  MULTIPLE VITAMINS PO Take by mouth.    [provider]  Olmesartan -amLODIPine -HCTZ 40-10-12.5 MG TABS Take 1 tablet by mouth daily. 06/13/23  Yes McDiarmid, Demetra Filter, MD    Physical Exam: Vitals:   10/09/23 2100 10/09/23 2130 10/09/23 2200 10/09/23 2230  BP: (!) 156/71 (!) 143/68 130/69 125/67  Pulse: 87 89 83 89  Resp: (!) 21 (!) 22 16 (!) 23  Temp:      TempSrc:      SpO2: 100% 100% 99% 98%   Physical Exam Constitutional:      Appearance: She is obese.  HENT:     Head: Normocephalic.     Nose: Nose normal.     Mouth/Throat:     Mouth: Mucous membranes are moist.     Pharynx: Oropharynx is clear.  Eyes:     Conjunctiva/sclera: Conjunctivae normal.     Pupils: Pupils are equal, round, and reactive to light.  Cardiovascular:     Rate and Rhythm: Normal rate and regular rhythm.     Pulses: Normal pulses.     Heart sounds: Normal heart sounds.  Pulmonary:     Effort: Pulmonary effort is normal.     Breath sounds: Normal breath sounds.  Abdominal:     General: Abdomen is flat. Bowel sounds are normal.  Musculoskeletal:        General: Normal range of motion.  Skin:    General: Skin is warm and dry.  Neurological:     Mental Status: She is alert and oriented  to person, place, and time.     Cranial Nerves: No cranial nerve deficit.     Sensory: Sensory deficit present.     Motor: Weakness present.  Psychiatric:        Mood and Affect: Mood normal.        Labs on Admission: I have personally reviewed the patients's labs and imaging studies.  Assessment/Plan Principal Problem:   Acute right-sided weakness   # Right-sided weakness concerning for CVA versus hypertensive encephalopathy - Neurologic exam focal with right-sided deficits - Neurology consulted and recommended MRI  Plan: MRI ordered Echocardiogram Therapy evaluation Patient outside window for intervention  #  Hypertension-hold amlodipine , olmesartan -continue hydrochlorothiazide -losartan -spironolactone  # Chronic constipation-continue senna    Admission status: Inpatient Telemetry  Certification: The appropriate patient status for this patient is INPATIENT. Inpatient status is judged to be reasonable and necessary in order to provide the required intensity of service to ensure the patient's safety. The patient's presenting symptoms, physical exam findings, and initial radiographic and laboratory data in the context of their chronic comorbidities is felt to place them at high risk for further clinical deterioration. Furthermore, it is not anticipated that the patient will be medically stable for discharge from the hospital within 2 midnights of admission.   * I certify that at the point of admission it is my clinical judgment that the patient will require inpatient hospital care spanning beyond 2 midnights from the point of admission due to high intensity of service, high risk for further deterioration and high frequency of surveillance required.Myrl Askew MD [facility] If 7PM-7AM, please contact night-coverage [email or web address]  10/09/2023, 10:55 PM

## 2023-10-09 NOTE — ED Triage Notes (Addendum)
 Pt came in for head, neck and back pain since Monday. Pt also stated she thought she just had a "bug" but realized she started to struggle with picking things up with her right arm on Monday as well.

## 2023-10-09 NOTE — ED Notes (Signed)
 Pt in CT.

## 2023-10-09 NOTE — ED Provider Notes (Signed)
 [place] EMERGENCY DEPARTMENT AT [facility] Provider Note   CSN: [id number] Arrival date & time: 10/09/23  1708     History  Chief Complaint  Patient presents with   Neck Pain   Back Pain   Extremity Weakness    Susan Sanders is a 53 y.o. female history of hypertension, here presenting with neck pain and back pain and right arm weakness.  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  Patient states that for the last 2 days, she noticed right arm weakness.  She noticed that she is dropping things with the right arm.  She also noticed trouble speaking as well.  Patient also has low back pain as well.  Patient is concerned that she may have a stroke.  Patient was noted to have blood pressure over 200 on arrival.  Patient states that she is compliant with her blood pressure medicine  The history is provided by the patient.       Home Medications Prior to Admission medications   Medication Sig Start Date End Date Taking? Authorizing Provider  eplerenone  (INSPRA ) 50 MG tablet Take 1 tablet (50 mg total) by mouth 2 (two) times daily. 04/18/23   McDiarmid, Demetra Filter, MD  ferrous sulfate  325 (65 FE) MG tablet Take 325 mg by mouth daily with breakfast.    [provider]  MULTIPLE VITAMINS PO Take by mouth.    [provider]  Olmesartan -amLODIPine -HCTZ 40-10-12.5 MG TABS Take 1 tablet by mouth daily. 06/13/23   McDiarmid, Demetra Filter, MD  ondansetron  (ZOFRAN ) 4 MG tablet Take 1 tablet (4 mg total) by mouth every 8 (eight) hours as needed for nausea or vomiting. 07/12/23   Dameron, Raymona Caldwell, DO      Allergies    Amoxicillin, Latex, Naproxen , and Tramadol     Review of Systems   Review of Systems  Musculoskeletal:  Positive for back pain and neck pain.  All other systems reviewed and are negative.   Physical Exam Updated Vital Signs BP (!) 148/68   Pulse 82   Temp 99 F (37.2 C) (Oral)   Resp 15   LMP 06/24/2018   SpO2 100%  Physical Exam Vitals and  nursing note reviewed.  Constitutional:      Comments: Uncomfortable  HENT:     Head: Normocephalic.     Nose: Nose normal.     Mouth/Throat:     Mouth: Mucous membranes are moist.  Eyes:     Extraocular Movements: Extraocular movements intact.     Pupils: Pupils are equal, round, and reactive to light.  Cardiovascular:     Rate and Rhythm: Normal rate and regular rhythm.     Pulses: Normal pulses.     Heart sounds: Normal heart sounds.  Pulmonary:     Effort: Pulmonary effort is normal.     Breath sounds: Normal breath sounds.  Abdominal:     General: Abdomen is flat.     Palpations: Abdomen is soft.  Musculoskeletal:        General: Normal range of motion.     Cervical back: Normal range of motion and neck supple.     Comments: Mild lower lumbar tenderness  Neurological:     General: No focal deficit present.     Mental Status: She is alert.     Comments: Strength is 4 out of 5 on the right arm and 5 out of 5 of left arm and bilateral legs.  Patient has no obvious  facial droop.  Patient has normal finger-to-nose bilaterally.  Normal sensation in all extremities and face.  Patient also has normal gait  Psychiatric:        Mood and Affect: Mood normal.     ED Results / Procedures / Treatments   Labs (all labs ordered are listed, but only abnormal results are displayed) Labs Reviewed  CBC WITH DIFFERENTIAL/PLATELET - Abnormal; Notable for the following components:      Result Value   MCH 25.9 (*)    RDW 15.6 (*)    All other components within normal limits  COMPREHENSIVE METABOLIC PANEL WITH GFR - Abnormal; Notable for the following components:   Glucose, Bld 103 (*)    All other components within normal limits  RESP PANEL BY RT-PCR (RSV, FLU A&B, COVID)  [id number]  URINALYSIS, ROUTINE W REFLEX MICROSCOPIC  I-STAT CHEM 8, ED  TROPONIN I (HIGH SENSITIVITY)  TROPONIN I (HIGH SENSITIVITY)    EKG EKG Interpretation Date/Time:  Wednesday Oct 09 2023 18:32:56  EDT Ventricular Rate:  88 PR Interval:  131 QRS Duration:  85 QT Interval:  387 QTC Calculation: 469 R Axis:   34  Text Interpretation: Sinus rhythm No significant change since last tracing Confirmed by Florette Hurry [phone]) on 10/09/2023 6:33:57 PM  Radiology DG Chest Port 1 View Result Date: 10/09/2023 CLINICAL DATA:  Chest pain and shortness of breath over the last 3 days EXAM: PORTABLE CHEST 1 VIEW COMPARISON:  09/04/2022 FINDINGS: Mild dextroconvex lower thoracic scoliosis. Cardiac and mediastinal margins appear normal. Linear subsegmental atelectasis peripherally at the left lung base. IMPRESSION: 1. Linear subsegmental atelectasis peripherally at the left lung base. 2. Mild dextroconvex lower thoracic scoliosis. Electronically Signed   By: Freida Jes M.D.   On: 10/09/2023 18:12    Procedures Procedures    Medications Ordered in ED Medications  hydrALAZINE  (APRESOLINE ) injection 10 mg (has no administration in time range)  hydrALAZINE  (APRESOLINE ) injection 10 mg (10 mg Intravenous Given 10/09/23 1755)  morphine  (PF) 4 MG/ML injection 4 mg (4 mg Intravenous Given 10/09/23 1757)  labetalol (NORMODYNE) injection 10 mg (10 mg Intravenous Given 10/09/23 1844)    ED Course/ Medical Decision Making/ A&P                                 Medical Decision Making Susan Sanders is a 53 y.o. female here presenting with right arm weakness and also trouble speaking.  Concern for possible stroke versus hypertensive emergency versus aortic dissection.  Plan to get CBC and CMP and CTA head and neck and CT dissection study.  Will give IV BP meds as well.  7 pm This case with Dr. Bonnita Buttner from neurology.  He states that if patient does not have an LVO on the CTA, patient can be admitted at [facility] and get MRI in the morning.  If patient does have stroke on MRI then please consult neurology.  Patient received hydralazine  initially and blood pressure still over 200.  Will try labetalol.  9:22  PM Patient's blood pressure is down to 178 from 2 2:20 doses of hydralazine  1 dose of labetalol.  Dissection study did not show any dissection or LVO.  At this point patient will be admitted for hypertensive urgency.  I have ordered MRI brain to be done in the morning.  Hospitalist to admit  Amount and/or Complexity of Data Reviewed Labs: ordered. Radiology: ordered.  Risk Prescription drug  management.    Final Clinical Impression(s) / ED Diagnoses Final diagnoses:  None    Rx / DC Orders ED Discharge Orders     None         Dalene Duck, MD 10/09/23 2123

## 2023-10-10 ENCOUNTER — Inpatient Hospital Stay (HOSPITAL_COMMUNITY)

## 2023-10-10 ENCOUNTER — Encounter (HOSPITAL_COMMUNITY): Payer: Self-pay | Admitting: Internal Medicine

## 2023-10-10 ENCOUNTER — Encounter (HOSPITAL_COMMUNITY)

## 2023-10-10 DIAGNOSIS — I5021 Acute systolic (congestive) heart failure: Secondary | ICD-10-CM | POA: Diagnosis not present

## 2023-10-10 DIAGNOSIS — I639 Cerebral infarction, unspecified: Secondary | ICD-10-CM

## 2023-10-10 DIAGNOSIS — E785 Hyperlipidemia, unspecified: Secondary | ICD-10-CM

## 2023-10-10 DIAGNOSIS — I10 Essential (primary) hypertension: Secondary | ICD-10-CM | POA: Diagnosis not present

## 2023-10-10 DIAGNOSIS — R29703 NIHSS score 3: Secondary | ICD-10-CM | POA: Diagnosis not present

## 2023-10-10 DIAGNOSIS — R531 Weakness: Secondary | ICD-10-CM | POA: Diagnosis not present

## 2023-10-10 DIAGNOSIS — I739 Peripheral vascular disease, unspecified: Secondary | ICD-10-CM

## 2023-10-10 DIAGNOSIS — R29818 Other symptoms and signs involving the nervous system: Secondary | ICD-10-CM | POA: Diagnosis not present

## 2023-10-10 LAB — LIPID PANEL
Cholesterol: 206 mg/dL — ABNORMAL HIGH (ref 0–200)
HDL: 66 mg/dL (ref >40)
LDL Cholesterol: 125 mg/dL — ABNORMAL HIGH (ref 0–99)
Total CHOL/HDL Ratio: 3.1 ratio
Triglycerides: 74 mg/dL (ref <150)
VLDL: 15 mg/dL (ref 0–40)

## 2023-10-10 LAB — HEMOGLOBIN A1C
Hgb A1c MFr Bld: 5 % (ref 4.8–5.6)
Mean Plasma Glucose: 96.8 mg/dL

## 2023-10-10 LAB — ECHOCARDIOGRAM COMPLETE
AR max vel: 2.74 cm2
AV Peak grad: 7 mmHg
Ao pk vel: 1.32 m/s
Area-P 1/2: 2.81 cm2
Height: 65 in
MV VTI: 5.52 cm2
S' Lateral: 2.7 cm
Weight: 3227.53 [oz_av]

## 2023-10-10 MED ORDER — CLOPIDOGREL BISULFATE 75 MG PO TABS: 75.0000 mg | ORAL_TABLET | Freq: Every day | ORAL | Status: DC

## 2023-10-10 MED ORDER — ATORVASTATIN CALCIUM 80 MG PO TABS
80.0000 mg | ORAL_TABLET | Freq: Every day | ORAL | 0 refills | Status: DC
Start: 2023-10-11 — End: 2024-01-08

## 2023-10-10 MED ORDER — ASPIRIN 81 MG PO TBEC: 81.0000 mg | DELAYED_RELEASE_TABLET | Freq: Every day | ORAL | 0 refills | Status: DC

## 2023-10-10 MED ORDER — AMLODIPINE BESYLATE 5 MG PO TABS
10.0000 mg | ORAL_TABLET | Freq: Every day | ORAL | Status: DC
  Administered 2023-10-10: 10 mg via ORAL
  Filled 2023-10-10: qty 2

## 2023-10-10 MED ORDER — ATORVASTATIN CALCIUM 40 MG PO TABS
80.0000 mg | ORAL_TABLET | Freq: Every day | ORAL | Status: DC
  Administered 2023-10-10: 80 mg via ORAL
  Filled 2023-10-10: qty 2

## 2023-10-10 MED ORDER — CLOPIDOGREL BISULFATE 75 MG PO TABS: 75.0000 mg | ORAL_TABLET | Freq: Every day | ORAL | 0 refills | Status: DC

## 2023-10-10 MED ORDER — CLOPIDOGREL BISULFATE 75 MG PO TABS
300.0000 mg | ORAL_TABLET | Freq: Once | ORAL | Status: AC
  Administered 2023-10-10: 300 mg via ORAL
  Filled 2023-10-10: qty 4

## 2023-10-10 MED ORDER — LIDOCAINE 5 % EX PTCH
1.0000 | MEDICATED_PATCH | Freq: Once | CUTANEOUS | Status: DC
  Administered 2023-10-10: 1 via TRANSDERMAL
  Filled 2023-10-10: qty 1

## 2023-10-10 MED ORDER — LIDOCAINE 5 % EX PTCH
1.0000 | MEDICATED_PATCH | Freq: Once | CUTANEOUS | Status: AC
  Administered 2023-10-10: 1 via TRANSDERMAL
  Filled 2023-10-10: qty 1

## 2023-10-10 MED ORDER — ASPIRIN 81 MG PO TBEC
81.0000 mg | DELAYED_RELEASE_TABLET | Freq: Every day | ORAL | Status: DC
  Administered 2023-10-10: 81 mg via ORAL
  Filled 2023-10-10: qty 1

## 2023-10-10 MED ORDER — OXYCODONE-ACETAMINOPHEN 5-325 MG PO TABS
1.0000 | ORAL_TABLET | Freq: Four times a day (QID) | ORAL | Status: DC | PRN
  Administered 2023-10-10: 1 via ORAL
  Filled 2023-10-10: qty 1

## 2023-10-10 NOTE — Evaluation (Signed)
 Occupational Therapy Evaluation Patient Details Name: Susan Sanders MRN: [id number] DOB: Sep 11, 1970 Today's Date: 10/10/2023   History of Present Illness   Pt is a 53 y.o. 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 PMH significant for hypertension, chronic back pain, & depression.     Clinical Impressions Pt admitted with above diagnosis, prior to hospital admission pt was independent for ADLs/mobility including driving and working. Pt reports symptoms have resolved and she feels back to her baseline function. Minor strength deficits RUE vs LUE (4/5 MMT with mild decreased [facility]), performs clock drawing test WNL, and completes functional mobility in hallway 300+ ft without AD, no LOB noted during head turns while pt called out alternating room numbers. Recommending pt receive permission from MD regarding returning to driving given memory deficits noted on OT eval and earlier today by SLP. Pt would additionally benefit from further screening to assess ability to perform med and financial management independently. Aunt can assist with IADLs at hospital discharge. Recommending outpatient OT follow up at this time.     If plan is discharge home, recommend the following:   Assistance with cooking/housework;Direct supervision/assist for medications management;Direct supervision/assist for financial management;Assist for transportation;Help with stairs or ramp for entrance;Supervision due to cognitive status     Functional Status Assessment   Patient has had a recent decline in their functional status and demonstrates the ability to make significant improvements in function in a reasonable and predictable amount of time.     Equipment Recommendations   None recommended by OT      Precautions/Restrictions   Restrictions Weight Bearing Restrictions Per Provider Order: No     Mobility Bed  Mobility Overal bed mobility: Modified Independent             General bed mobility comments: increased time and use of rails    Transfers Overall transfer level: Modified independent Equipment used: None               General transfer comment: no LOB during transfers      Balance Overall balance assessment: No apparent balance deficits (not formally assessed)                                         ADL either performed or assessed with clinical judgement   ADL Overall ADL's : At baseline;Modified independent                                       General ADL Comments: pt appearing at baseline for ADL performance, functional mobility in hallway with head turns calling out room numbers with normal gait speed, no AD and supervision for balance. No LOB noted.     Vision Baseline Vision/History: 1 Wears glasses Ability to See in Adequate Light: 0 Adequate Patient Visual Report: No change from baseline Vision Assessment?: Wears glasses for reading Additional Comments: reads large print on wall WNL, WFL to read name badge (does not have reading glasses in room, pt squints)            Pertinent Vitals/Pain Pain Assessment Pain Assessment: No/denies pain     Extremity/Trunk Assessment Upper Extremity Assessment Upper Extremity Assessment: Right hand dominant;RUE deficits/detail (5/5 LUE) RUE Deficits / Details: 4/5 strength RUE RUE  Sensation: WNL RUE Coordination: decreased fine motor (minor coordination deficits with RUE)   Lower Extremity Assessment Lower Extremity Assessment: Defer to PT evaluation   Cervical / Trunk Assessment Cervical / Trunk Assessment: Normal   Communication Communication Communication: Impaired Factors Affecting Communication: Reduced clarity of speech   Cognition Arousal: Lethargic (sleepy) Behavior During Therapy: Flat affect Cognition: Cognition impaired     Awareness: Intellectual awareness  intact, Online awareness impaired Memory impairment (select all impairments): Short-term memory, Working memory Attention impairment (select first level of impairment): Selective attention   OT - Cognition Comments: Pt denies cognitive changes, noted impairements in STM on SLP eval. Clock drawing 3/3                 Following commands: Intact       Cueing  General Comments   Cueing Techniques: Verbal cues      Exercises Exercises: Other exercises Other Exercises Other Exercises: edu provided to pt on role of OT in acute setting, recommendations for assist for financial and medication management Other Exercises: recommended pt recieve clearance from MD prior to returning to driving, potential for pre-driving screen at outpatient clinic   Shoulder Instructions      Home Living Family/patient expects to be discharged to:: Private residence Living Arrangements: Children;Other relatives Available Help at Discharge: Family;Available 24 hours/day;Available PRN/intermittently (aunt is able to assist with ADL/IADLs as needed) Type of Home: House Home Access: Level entry     Home Layout: One level     Bathroom Shower/Tub: Chief Strategy Officer: Standard     Home Equipment: None      Lives With: Daughter    Prior Functioning/Environment Prior Level of Function : Independent/Modified Independent;Driving             Mobility Comments: no falls, independent, working ADLs Comments: independent, driving    OT Problem List: Decreased cognition;Decreased coordination   OT Treatment/Interventions: Self-care/ADL training;Neuromuscular education;Cognitive remediation/compensation;Therapeutic activities      OT Goals(Current goals can be found in the care plan section)   Acute Rehab OT Goals OT Goal Formulation: With patient/family Time For Goal Achievement: 10/17/23 Potential to Achieve Goals: Good   OT Frequency:  Min 1X/week       AM-PAC OT  "6 Clicks" Daily Activity     Outcome Measure Help from another person eating meals?: None Help from another person taking care of personal grooming?: None Help from another person toileting, which includes using toliet, bedpan, or urinal?: None Help from another person bathing (including washing, rinsing, drying)?: None Help from another person to put on and taking off regular upper body clothing?: None Help from another person to put on and taking off regular lower body clothing?: None 6 Click Score: 24   End of Session Nurse Communication: Mobility status  Activity Tolerance: Patient tolerated treatment well Patient left: in bed;with call bell/phone within reach;with bed alarm set;with family/visitor present  OT Visit Diagnosis: Other abnormalities of gait and mobility (R26.89);Other symptoms and signs involving the nervous system (R29.898)                Time: 5621-3086 OT Time Calculation (min): 15 min Charges:  OT General Charges $OT Visit: 1 Visit OT Evaluation $OT Eval Low Complexity: 1 Low  Susan Sanders L. Minka Knight, OTR/L  10/10/23, 2:45 PM

## 2023-10-10 NOTE — Progress Notes (Signed)
 MD notified that patient's BP was still high with BP being 191/77. Patient having back pain too, which can be a partial factor to the elevated BP.

## 2023-10-10 NOTE — Evaluation (Signed)
 Physical Therapy One Time Evaluation Patient Details Name: Susan Sanders MRN: [id number] DOB: [birth year] Today's Date: 10/10/2023  History of Present Illness  Pt is a 53 y.o. 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 PMH significant for hypertension, chronic back pain, & depression.  Clinical Impression  Patient evaluated by Physical Therapy with no further acute PT needs identified. All education has been completed and the patient has no further questions.  Pt able to ambulate in hallway and around room without deficits or symptoms. [name] in room plans to assist pt upon d/c. No further follow-up Physical Therapy or equipment needs. PT is signing off. Thank you for this referral.         If plan is discharge home, recommend the following:     Can travel by private vehicle        Equipment Recommendations None recommended by PT  Recommendations for Other Services       Functional Status Assessment Patient has not had a recent decline in their functional status     Precautions / Restrictions Precautions Precautions: None      Mobility  Bed Mobility Overal bed mobility: Modified Independent             General bed mobility comments: increased time and use of rails    Transfers Overall transfer level: Modified independent                      Ambulation/Gait Ambulation/Gait assistance: Supervision, Modified independent (Device/Increase time) Gait Distance (Feet): 380 Feet Assistive device: None Gait Pattern/deviations: WFL(Within Functional Limits)       General Gait Details: no deficits or symptoms reported  Stairs            Wheelchair Mobility     Tilt Bed    Modified Rankin (Stroke Patients Only)       Balance Overall balance assessment: No apparent balance deficits (not formally assessed)                                            Pertinent Vitals/Pain Pain Assessment Pain Assessment: No/denies pain    Home Living Family/patient expects to be discharged to:: Private residence Living Arrangements: Children;Other relatives Available Help at Discharge: Family;Available 24 hours/day;Available PRN/intermittently (aunt is able to assist with ADL/IADLs as needed) Type of Home: House Home Access: Level entry       Home Layout: One level Home Equipment: None      Prior Function Prior Level of Function : Independent/Modified Independent;Driving             Mobility Comments: no falls, independent, working ADLs Comments: independent, driving     Extremity/Trunk Assessment   Upper Extremity Assessment Upper Extremity Assessment: Right hand dominant;RUE deficits/detail (5/5 LUE) RUE Deficits / Details: 4/5 strength RUE RUE Sensation: WNL RUE Coordination: decreased fine motor (minor coordination deficits with RUE)    Lower Extremity Assessment Lower Extremity Assessment: Overall WFL for tasks assessed (observed at least 4/5 strength; pt denies weakness, numbness and tingling)    Cervical / Trunk Assessment Cervical / Trunk Assessment: Normal  Communication   Communication Communication: Impaired Factors Affecting Communication: Reduced clarity of speech    Cognition Arousal: Alert Behavior During Therapy: Flat affect  Following commands: Intact       Cueing       General Comments      Exercises     Assessment/Plan    PT Assessment Patient does not need any further PT services  PT Problem List         PT Treatment Interventions      PT Goals (Current goals can be found in the Care Plan section)  Acute Rehab PT Goals PT Goal Formulation: All assessment and education complete, DC therapy    Frequency       Co-evaluation               AM-PAC PT "6 Clicks" Mobility  Outcome Measure Help needed turning from your back to  your side while in a flat bed without using bedrails?: None Help needed moving from lying on your back to sitting on the side of a flat bed without using bedrails?: None Help needed moving to and from a bed to a chair (including a wheelchair)?: None Help needed standing up from a chair using your arms (e.g., wheelchair or bedside chair)?: None Help needed to walk in hospital room?: A Little Help needed climbing 3-5 steps with a railing? : A Little 6 Click Score: 22    End of Session Equipment Utilized During Treatment: Gait belt Activity Tolerance: Patient tolerated treatment well Patient left: with call bell/phone within reach;Other (comment) (bathroom with aunt supervising) Nurse Communication: Mobility status PT Visit Diagnosis: Difficulty in walking, not elsewhere classified (R26.2)    Time: 0981-1914 PT Time Calculation (min) (ACUTE ONLY): 10 min   Charges:   PT Evaluation $PT Eval Low Complexity: 1 Low   PT General Charges $$ ACUTE PT VISIT: 1 Visit       Kati PT, DPT Physical Therapist Acute Rehabilitation Services Office: [phone]   Myna Asal Payson 10/10/2023, 3:18 PM

## 2023-10-10 NOTE — Progress Notes (Signed)
 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 MD placed new orders.

## 2023-10-10 NOTE — Evaluation (Signed)
 Speech Language Pathology Evaluation Patient Details Name: Susan Sanders MRN: [id number] DOB: 1970/11/02 Today's Date: 10/10/2023 Time: 7829-5621 SLP Time Calculation (min) (ACUTE ONLY): 47 min  Problem List:  Patient Active Problem List   Diagnosis Date Noted   Acute right-sided weakness 10/09/2023   Vomiting 07/12/2023   Grief 03/28/2022   Obesity 02/28/2022   Ganglion cyst 02/28/2022   Trapezius muscle spasm 10/13/2020   Acute right-sided low back pain without sciatica 09/12/2020   Seasonal allergies 08/25/2019   Leiomyoma of body of uterus 07/21/2018   Generalized anxiety disorder 06/03/2018   Overweight 11/02/2016   Essential hypertension 07/14/2015   Cervical spondylolysis 06/18/2014   GERD (gastroesophageal reflux disease) 12/10/2013   Menorrhagia with regular cycle 03/20/2007   Anxiety and depression 01/23/2007   Past Medical History:  Past Medical History:  Diagnosis Date   Allergy    Anemia    Anxiety    Cellulitis    Chest pain    Childhood asthma    Chronic back pain    "all over" (05/29/2018)   Complication of anesthesia    "difficult waking up " (03/30/2019)   Depression    Eczema    Hypertension    Scoliosis    Past Surgical History:  Past Surgical History:  Procedure Laterality Date   ABDOMINAL HYSTERECTOMY     BREAST SURGERY     breast biopsy-right breast   HYSTERECTOMY ABDOMINAL WITH SALPINGO-OOPHORECTOMY Bilateral 07/21/2018   Procedure: TOTAL HYSTERECTOMY ABDOMINAL, BILATERAL SALPINGO-OOPHORECTOMY;  Surgeon: Lacretia Piccolo, MD;  Location: [facility];  Service: Gynecology;  Laterality: Bilateral;   TUBAL LIGATION     HPI:  Pt is a 53 yo female adm to [facility] with right UE weakness and numbnes, somnolence and dizziness.  PMH + for HTN, DDD, lung nodule, depression/anxiety, chronic back pain - on pain patch, aphasia - that largely has resolved.  Pt MRI showed left acute/subacute hippocampus CVA. Pt has worked as a Psychologist, sport and exercise for  20 years, lives with her daughter and manages home duties. She also endorses having panic attacks - that present similarly to her CVA *aside from right UE weakness.   Assessment / Plan / Recommendation Clinical Impression  Portions of SLUMS *[employer] Mental Status Exam* and Western Aphasia Battery administered due to pt having language deficits PTA.  No focal CN deficits noted - pt had just received pain patch and was slightly sleepy during session. In addition, session was interrupted when answering paragraph level information. Pt able to verbalize her wants/needs without difficulties.  Able to name items, repeat sentence length information and follow multi-step directions.  Areas of difficulties observed with memory of 5 random words *2/5 independent, 2/5 with category cue, 1/5 with multiple choice cue indicative of retrieval deficits. She also demonstrated impaired recall of paragraph level information.  She did not recall name of current president.  Pt denies memory changes but her aunt notes difficulties.  Pt would benefit from follow up SLP at next venue of care to decrease caregiver burden and maximize rehab given pt manages home duties independently.  Her aunt, Cornelius Dill, reports she is able to help this pt manage bills, appointments, medications, etc pending SLP follow up. Pt and family agreeable to plan.    SLP Assessment  SLP Recommendation/Assessment: Patient needs continued Speech Lanaguage Pathology Services SLP Visit Diagnosis: Cognitive communication deficit (R41.841)    Recommendations for follow up therapy are one component of a multi-disciplinary discharge planning process, led by the attending  physician.  Recommendations may be updated based on patient status, additional functional criteria and insurance authorization.    Follow Up Recommendations  Other (comment) (f/u at next venue)    Assistance Recommended at Discharge  None  Functional Status Assessment Patient has  had a recent decline in their functional status and demonstrates the ability to make significant improvements in function in a reasonable and predictable amount of time.  Frequency and Duration min 1 x/week  1 week      SLP Evaluation Cognition  Overall Cognitive Status:  (Aunt states that pt has new memory deficits - pt denies) Arousal/Alertness: Awake/alert Orientation Level: Oriented to person;Oriented to place;Oriented to time;Oriented to situation (disoriented to current president) Year: 2025 Month: May Day of Week: Correct Attention: Selective Selective Attention: Impaired Memory: Impaired Memory Impairment: Retrieval deficit (recalled 2/5 words independently, 2/5 with multiple choice cue and 1/5 with category cue, recalled 1/4 answers to paragraph level information) Awareness: Impaired Awareness Impairment: Intellectual impairment (pt denies memory deficits) Safety/Judgment: Appears intact (willing to have her aunt help her at home with bills, medications, appointments)       Comprehension  Auditory Comprehension Overall Auditory Comprehension: Appears within functional limits for tasks assessed Yes/No Questions: Within Functional Limits (9/10 answered correctly, 1/10 with pause for self correction) Commands: Within Functional Limits (complex) Conversation: Complex Visual Recognition/Discrimination Discrimination: Not tested Reading Comprehension Reading Status: Not tested (pt needs reading glasses that are at home at this time)    Expression Expression Primary Mode of Expression: Verbal Verbal Expression Overall Verbal Expression: Appears within functional limits for tasks assessed Initiation: No impairment Level of Generative/Spontaneous Verbalization: Sentence Repetition: No impairment Naming: No impairment Pragmatics: No impairment Written Expression Dominant Hand: Right Written Expression: Exceptions to [facility] (pt wrote simple sentence but misspelled words,  uncertain to baseline - able to read meaning outloud of written material)   Oral / Motor  Oral Motor/Sensory Function Overall Oral Motor/Sensory Function: Within functional limits Motor Speech Overall Motor Speech: Appears within functional limits for tasks assessed Respiration: Within functional limits Phonation: Low vocal intensity (pt just received her pain medications) Resonance: Within functional limits Articulation: Within functional limitis Intelligibility: Intelligible Motor Planning: Witnin functional limits Motor Speech Errors: Not applicable           Maudie Sorrow, MS [facility] SLP Acute Rehab Services Office [phone]  Chantal Comment 10/10/2023, 12:30 PM

## 2023-10-10 NOTE — TOC Transition Note (Signed)
 Transition of Care [facility]) - Discharge Note   Patient Details  Name: Susan Sanders MRN: [id number] Date of Birth: 1970-07-29  [facility] CM/SW Contact:  Ruben Corolla, RN Phone Number: 10/10/2023, 2:50 PM   Clinical Narrative: otpt PT/OT-referral on d/c for patient to call.No further CM needs.      Final next level of care: OP Rehab Barriers to Discharge: No Barriers Identified   Patient Goals and CMS Choice Patient states their goals for this hospitalization and ongoing recovery are:: Home CMS Medicare.gov Compare Post Acute Care list provided to:: Patient Choice offered to / list presented to : Patient [place] ownership interest in [facility].provided to:: Patient    Discharge Placement                       Discharge Plan and Services Additional resources added to the After Visit Summary for     Discharge Planning Services: CM Consult                                 Social Drivers of Health (SDOH) Interventions SDOH Screenings   Food Insecurity: No Food Insecurity (10/10/2023)  Housing: Low Risk  (10/10/2023)  Transportation Needs: No Transportation Needs (10/10/2023)  Utilities: Not At Risk (10/10/2023)  Depression (PHQ2-9): Low Risk  (04/08/2023)  Tobacco Use: Medium Risk (10/10/2023)     Readmission Risk Interventions     No data to display

## 2023-10-10 NOTE — Discharge Summary (Signed)
 Physician Discharge Summary  Susan Sanders WJX:[id number] DOB: 11/02/1970 DOA: 10/09/2023  PCP: Santa Cuba, MD  Admit date: 10/09/2023 Discharge date: 10/10/2023  Admitted From: Home Discharge disposition: Home  Recommendations at discharge:  For stroke, you have been started on aspirin  81 mg daily and Plavix and 75 mg daily for 3 weeks to be followed by aspirin  81 mg daily alone. Your cholesterol level is elevated, you been started on atorvastatin 80 mg daily. Follow-up with PCP/neurology as an outpatient   Brief narrative: Susan Sanders is a 53 y.o. female with PMH significant for HTN, chronic back pain, anemia, anxiety/depression. 5/28, patient presented to the ED with due to right-sided weakness, numbness and back pain.  She was also having difficulty speaking and has been unable to pick up things with the right side family was concerned she may have a stroke.  In the ED, BP was over 200.  CXR showed no acute findings.   CTA head and neck was normal.   CTA chest abdomen pelvis showed no evidence of dissection.    EDP d/w neurology. 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 Neurology consulted.   Subjective: Patient was seen and examined this am. Lying down in bed. Not in distress.  No fever, BP elevated to 190s.  Hospital course: Acute stroke of left hippocampus Presented with right-sided weakness MRI brain finding with acute/subacute nonhemorrhagic infarct as above Stroke workup initiated. Seen by neurology Echocardiogram*** Seen by PT/OT.  No outpatient follow-up needed. Per neurology recommendation, continue aspirin  81 mg daily and Plavix and 75 mg daily for 3 weeks to be followed by aspirin  81 mg daily alone. LDL 123.  Started on statin  Hypertension PTA meds-losartan  50 mg daily, amlodipine  10 mg daily, HCTZ 12.5 mg  daily Continue  Goals of care   Code Status: Full Code   Diet:  Diet Order             Diet Heart Fluid consistency: Thin  Diet effective now           Diet general                   Nutritional status:  Body mass index is 33.57 kg/m.       Wounds:  -    Discharge Exam:   Vitals:   10/10/23 0414 10/10/23 0836 10/10/23 1201 10/10/23 1354  BP: (!) 148/69 (!) 190/81 (!) 191/77 (!) 176/75  Pulse: 78  78   Resp: 18     Temp: 98.1 F (36.7 C) 98.7 F (37.1 C) 98.5 F (36.9 C)   TempSrc: Oral Oral Oral   SpO2: 100% 100% 97%   Weight:      Height:        Body mass index is 33.57 kg/m.   General exam: Pleasant, middle-aged African-American female Skin: No rashes, lesions or ulcers. HEENT: Atraumatic, normocephalic, no obvious bleeding Lungs: Clear to auscultation bilaterally,  CVS: S1, S2, no murmur,   GI/Abd: Soft, nontender, nondistended, bowel sound present,   CNS: Alert, awake, oriented x 3, right upper extremity weakness seems to have improved significantly Psychiatry: Mood appropriate,  Extremities: No pedal edema, no calf tenderness,   Follow ups:    Follow-up Information     Santa Cuba, MD Follow up.   Specialty: Family Medicine Contact information: [address] [place] [address] [phone]  [facility] Follow up.   Specialty: Rehabilitation Why: outpatient PT/OT Contact information: [address] [place]  [phone]                Discharge Instructions:   Discharge Instructions     Ambulatory referral to Neurology   Complete by: As directed    stroke   Call MD for:  difficulty breathing, headache or visual disturbances   Complete by: As directed    Call MD for:  extreme fatigue   Complete by: As directed    Call MD for:  hives   Complete by: As directed    Call MD for:  persistant dizziness or  light-headedness   Complete by: As directed    Call MD for:  persistant nausea and vomiting   Complete by: As directed    Call MD for:  severe uncontrolled pain   Complete by: As directed    Call MD for:  temperature >100.4   Complete by: As directed    Diet general   Complete by: As directed    Discharge instructions   Complete by: As directed    Recommendations at discharge:   For stroke, you have been started on aspirin  81 mg daily and Plavix and 75 mg daily for 3 weeks to be followed by aspirin  81 mg daily alone.  Your cholesterol level is elevated, you been started on atorvastatin 80 mg daily.  Follow-up with PCP/neurology as an outpatient  General discharge instructions: Follow with Primary MD Santa Cuba, MD in 7 days  Please request your PCP  to go over your hospital tests, procedures, radiology results at the follow up. Please get your medicines reviewed and adjusted.  Your PCP may decide to repeat certain labs or tests as needed. Do not drive, operate heavy machinery, perform activities at heights, swimming or participation in water  activities or provide baby sitting services if your were admitted for syncope or siezures until you have seen by Primary MD or a Neurologist and advised to do so again. [place]  Controlled Substance Reporting System database was reviewed. Do not drive, operate heavy machinery, perform activities at heights, swim, participate in water  activities or provide baby-sitting services while on medications for pain, sleep and mood until your outpatient physician has reevaluated you and advised to do so again.  You are strongly recommended to comply with the dose, frequency and duration of prescribed medications. Activity: As tolerated with Full fall precautions use walker/cane & assistance as needed Avoid using any recreational substances like cigarette, tobacco, alcohol, or non-prescribed drug. If you experience worsening of your admission symptoms,  develop shortness of breath, life threatening emergency, suicidal or homicidal thoughts you must seek medical attention immediately by calling 911 or calling your MD immediately  if symptoms less severe. You must read complete instructions/literature along with all the possible adverse reactions/side effects for all the medicines you take and that have been prescribed to you. Take any new medicine only after you have completely understood and accepted all the possible adverse reactions/side effects.  Wear Seat belts while driving. You were cared for by a hospitalist during your hospital stay. If you have any questions about your discharge medications or the care you received while you were in the hospital after you are discharged, you can call the unit and ask to speak with the hospitalist or the covering physician. Once you are discharged, your primary care physician will handle any further medical  issues. Please note that NO REFILLS for any discharge medications will be authorized once you are discharged, as it is imperative that you return to your primary care physician (or establish a relationship with a primary care physician if you do not have one).   Increase activity slowly   Complete by: As directed        Discharge Medications:   Allergies as of 10/10/2023       Reactions   Amoxicillin Shortness Of Breath, Itching, Swelling   Has patient had a PCN reaction causing immediate rash, facial/tongue/throat swelling, SOB or lightheadedness with hypotension: Yes Has patient had a PCN reaction causing severe rash involving mucus membranes or skin necrosis: Yes Has patient had a PCN reaction that required hospitalization: Yes Has patient had a PCN reaction occurring within the last 10 years: No If all of the above answers are "NO", then may proceed with Cephalosporin use.   Latex Shortness Of Breath   Itching and burns skin   Naproxen  Hives, Itching   Takes IBU without difficulty   Tramadol   Hives, Itching, Other (See Comments)   Dysphoria - visual disturbance, hot sensatin        Medication List     STOP taking these medications    eplerenone  50 MG tablet Commonly known as: INSPRA    ferrous sulfate  325 (65 FE) MG tablet   Olmesartan -amLODIPine -HCTZ 40-10-12.5 MG Tabs       TAKE these medications    amLODipine  10 MG tablet Commonly known as: NORVASC  Take 10 mg by mouth daily.   aspirin  EC 81 MG tablet Take 1 tablet (81 mg total) by mouth daily. Swallow whole. Start taking on: Oct 11, 2023   atorvastatin 80 MG tablet Commonly known as: LIPITOR Take 1 tablet (80 mg total) by mouth daily. Start taking on: Oct 11, 2023   clopidogrel 75 MG tablet Commonly known as: PLAVIX Take 1 tablet (75 mg total) by mouth daily. Start taking on: Oct 11, 2023   losartan -hydrochlorothiazide  50-12.5 MG tablet Commonly known as: HYZAAR Take 1 tablet by mouth daily.   MULTIPLE VITAMINS PO Take by mouth.   ondansetron  4 MG tablet Commonly known as: Zofran  Take 1 tablet (4 mg total) by mouth every 8 (eight) hours as needed for nausea or vomiting.         The results of significant diagnostics from this hospitalization (including imaging, microbiology, ancillary and laboratory) are listed below for reference.    Procedures and Diagnostic Studies:   MR BRAIN WO CONTRAST Result Date: 10/10/2023 EXAM: MRI BRAIN WITHOUT CONTRAST 10/10/2023 07:50:06 AM TECHNIQUE: Multiplanar multisequence MRI of the head/brain was performed without the administration of intravenous contrast. COMPARISON: None available. CLINICAL HISTORY: Neuro deficit, acute, stroke suspected. FINDINGS: BRAIN AND VENTRICLES: An acute nonhemorrhagic infarct is present in the anterior left hippocampus. T2 and FLAIR hyperintensity is associated. No intracranial hemorrhage. No mass. No midline shift. No hydrocephalus. The sella is unremarkable. Normal flow voids. ORBITS: No acute abnormality. SINUSES AND  MASTOIDS: No acute abnormality. BONES AND SOFT TISSUES: Normal marrow signal. No acute soft tissue abnormality. IMPRESSION: 1. 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 Electronically signed by: Audree Leas MD 10/10/2023 08:49 AM EDT RP Workstation: [id number]   CT Angio Chest/Abd/Pel for Dissection W and/or Wo Contrast Result Date: 10/09/2023 CLINICAL DATA:  Head neck back pain EXAM: CT ANGIOGRAPHY CHEST, ABDOMEN AND PELVIS TECHNIQUE: Non-contrast CT of the chest was initially obtained. Multidetector CT imaging through the  chest, abdomen and pelvis was performed using the standard protocol during bolus administration of intravenous contrast. Multiplanar reconstructed images and MIPs were obtained and reviewed to evaluate the vascular anatomy. RADIATION DOSE REDUCTION: This exam was performed according to the departmental dose-optimization program which includes automated exposure control, adjustment of the mA and/or kV according to patient size and/or use of iterative reconstruction technique. CONTRAST:  OMNIPAQUE  IOHEXOL  350 MG/ML SOLN COMPARISON:  CTA 05/29/2018, CT abdomen pelvis 12/20/2022 FINDINGS: CTA CHEST FINDINGS Cardiovascular: Non contrasted images of the chest demonstrate no acute intramural hematoma. Negative for aneurysm or dissection. Normal cardiac size. No pericardial effusion Mediastinum/Nodes: Patent trachea. Diffusely enlarged thyroid  gland with multiple subcentimeter nodules, similar compared to prior, no specific imaging follow-up is recommended. No suspicious lymph nodes. Esophagus within normal limits Lungs/Pleura: No acute airspace disease, pleural effusion or pneumothorax. Scattered small sub 5 mm pulmonary nodules for which no specific imaging follow-up is recommended. Musculoskeletal: No acute osseous abnormality Review of the MIP images confirms the above findings. CTA ABDOMEN AND PELVIS FINDINGS  VASCULAR Aorta: Normal caliber aorta without aneurysm, dissection, vasculitis or significant stenosis. Celiac: Patent without evidence of aneurysm, dissection, vasculitis or significant stenosis. SMA: Patent without evidence of aneurysm, dissection, vasculitis or significant stenosis. Renals: Both renal arteries are patent without evidence of aneurysm, dissection, vasculitis, fibromuscular dysplasia or significant stenosis. IMA: Suspect moderate origin stenosis. Distal vascular patency. No aneurysm or occlusion Inflow: Ulcerated appearing at the left common iliac artery. No aneurysm or dissection. No high-grade stenosis. Patent external and internal iliac vessels. Veins: Suboptimally evaluated.  Circumaortic left renal vein. Review of the MIP images confirms the above findings. NON-VASCULAR Hepatobiliary: No focal liver abnormality is seen. No gallstones, gallbladder wall thickening, or biliary dilatation. Pancreas: Unremarkable. No pancreatic ductal dilatation or surrounding inflammatory changes. Spleen: Normal in size without focal abnormality. Adrenals/Urinary Tract: Adrenal glands are normal. Kidneys show no hydronephrosis. Scarring in the left kidney. The bladder is normal Stomach/Bowel: Stomach nonenlarged. No dilated small bowel. No acute bowel wall thickening. Negative appendix Lymphatic: No suspicious lymph nodes Reproductive: Hysterectomy.  No adnexal mass Other: Negative for pelvic effusion or free air. Musculoskeletal: No acute or suspicious osseous abnormality. Scoliosis of the spine Review of the MIP images confirms the above findings. IMPRESSION: 1. Negative for acute aortic dissection or aneurysm. Some ulcerated plaque within the left common iliac artery. No significant vascular stenosis or occlusive disease within the abdomen and pelvis 2. No CT evidence for acute intrathoracic, intra-abdominal or intrapelvic abnormality Electronically Signed   By: Esmeralda Hedge M.D.   On: 10/09/2023 20:38   CT  ANGIO HEAD NECK W WO CM Result Date: 10/09/2023 CLINICAL DATA:  Acute neurologic deficit EXAM: CT ANGIOGRAPHY HEAD AND NECK WITH AND WITHOUT CONTRAST TECHNIQUE: Multidetector CT imaging of the head and neck was performed using the standard protocol during bolus administration of intravenous contrast. Multiplanar CT image reconstructions and MIPs were obtained to evaluate the vascular anatomy. Carotid stenosis measurements (when applicable) are obtained utilizing NASCET criteria, using the distal internal carotid diameter as the denominator. RADIATION DOSE REDUCTION: This exam was performed according to the departmental dose-optimization program which includes automated exposure control, adjustment of the mA and/or kV according to patient size and/or use of iterative reconstruction technique. CONTRAST:  OMNIPAQUE  IOHEXOL  350 MG/ML SOLN COMPARISON:  None Available. FINDINGS: CT HEAD FINDINGS Brain: No mass,hemorrhage or extra-axial collection. Normal appearance of the parenchyma and CSF spaces. Vascular: No hyperdense vessel or unexpected vascular calcification. Skull: The visualized skull  base, calvarium and extracranial soft tissues are normal. Sinuses/Orbits: No fluid levels or advanced mucosal thickening of the visualized paranasal sinuses. No mastoid or middle ear effusion. Normal orbits. CTA NECK FINDINGS Skeleton: No acute abnormality or high grade bony spinal canal stenosis. Other neck: Normal pharynx, larynx and major salivary glands. No cervical lymphadenopathy. Multinodular goiter. Upper chest: No pneumothorax or pleural effusion. No nodules or masses. Aortic arch: There is no calcific atherosclerosis of the aortic arch. Conventional 3 vessel aortic branching pattern. RIGHT carotid system: Normal without aneurysm, dissection or stenosis. LEFT carotid system: Normal without aneurysm, dissection or stenosis. Vertebral arteries: Left dominant configuration. There is no dissection, occlusion or  flow-limiting stenosis to the skull base (V1-V3 segments). CTA HEAD FINDINGS POSTERIOR CIRCULATION: Vertebral arteries are normal. No proximal occlusion of the anterior or inferior cerebellar arteries. Basilar artery is normal. Superior cerebellar arteries are normal. Posterior cerebral arteries are normal. ANTERIOR CIRCULATION: Intracranial internal carotid arteries are normal. Anterior cerebral arteries are normal. Middle cerebral arteries are normal. Venous sinuses: As permitted by contrast timing, patent. Anatomic variants: None Review of the MIP images confirms the above findings. IMPRESSION: 1. Normal CTA of the head and neck. 2. Multinodular goiter. Nonemergent thyroid  ultrasound recommended as an outpatient Electronically Signed   By: Juanetta Nordmann M.D.   On: 10/09/2023 20:27   DG Chest Port 1 View Result Date: 10/09/2023 CLINICAL DATA:  Chest pain and shortness of breath over the last 3 days EXAM: PORTABLE CHEST 1 VIEW COMPARISON:  09/04/2022 FINDINGS: Mild dextroconvex lower thoracic scoliosis. Cardiac and mediastinal margins appear normal. Linear subsegmental atelectasis peripherally at the left lung base. IMPRESSION: 1. Linear subsegmental atelectasis peripherally at the left lung base. 2. Mild dextroconvex lower thoracic scoliosis. Electronically Signed   By: Freida Jes M.D.   On: 10/09/2023 18:12     Labs:   Basic Metabolic Panel: Recent Labs  Lab 10/09/23 1748 10/09/23 1758  NA 136 139  K 3.5 3.6  CL 102 102  CO2 26  --   GLUCOSE 103* 98  BUN 10 8  CREATININE 0.82 0.90  CALCIUM  9.3  --    GFR Estimated Creatinine Clearance: 78.9 mL/min (by C-G formula based on SCr of 0.9 mg/dL). Liver Function Tests: Recent Labs  Lab 10/09/23 1748  AST 18  ALT 17  ALKPHOS 68  BILITOT 0.6  PROT 7.8  ALBUMIN 4.3   No results for input(s): "LIPASE", "AMYLASE" in the last 168 hours. No results for input(s): "AMMONIA" in the last 168 hours. Coagulation profile No results for  input(s): "INR", "PROTIME" in the last 168 hours.  CBC: Recent Labs  Lab 10/09/23 1748 10/09/23 1758  WBC 8.3  --   NEUTROABS 3.9  --   HGB 12.5 13.9  HCT 40.4 41.0  MCV 83.6  --   PLT 399  --    Cardiac Enzymes: No results for input(s): "CKTOTAL", "CKMB", "CKMBINDEX", "TROPONINI" in the last 168 hours. BNP: Invalid input(s): "POCBNP" CBG: No results for input(s): "GLUCAP" in the last 168 hours. D-Dimer No results for input(s): "DDIMER" in the last 72 hours. Hgb A1c Recent Labs    10/09/23 1748  HGBA1C 5.0   Lipid Profile Recent Labs    10/10/23 0503  CHOL 206*  HDL 66  LDLCALC 125*  TRIG 74  CHOLHDL 3.1   Thyroid  function studies No results for input(s): "TSH", "T4TOTAL", "T3FREE", "THYROIDAB" in the last 72 hours.  Invalid input(s): "FREET3" Anemia work up No results for input(s): "VITAMINB12", "FOLATE", "  FERRITIN", "TIBC", "IRON", "RETICCTPCT" in the last 72 hours. Microbiology Recent Results (from the past 240 hours)  Resp panel by RT-PCR (RSV, Flu A&B, Covid) Anterior Nasal Swab     Status: None   Collection Time: 10/09/23  6:01 PM   Specimen: Anterior Nasal Swab  Result Value Ref Range Status   SARS Coronavirus 2 by RT PCR NEGATIVE NEGATIVE Final    Comment: (NOTE) SARS-CoV-2 target nucleic acids are NOT DETECTED.  The SARS-CoV-2 RNA is generally detectable in upper respiratory specimens during the acute phase of infection. The lowest concentration of SARS-CoV-2 viral copies this assay can detect is 138 copies/mL. A negative result does not preclude SARS-Cov-2 infection and should not be used as the sole basis for treatment or other patient management decisions. A negative result may occur with  improper specimen collection/handling, submission of specimen other than nasopharyngeal swab, presence of viral mutation(s) within the areas targeted by this assay, and inadequate number of viral copies(<138 copies/mL). A negative result must be combined  with clinical observations, patient history, and epidemiological information. The expected result is Negative.  Fact Sheet for Patients:  BloggerCourse.com  Fact Sheet for Healthcare Providers:  SeriousBroker.it  This test is no t yet approved or cleared by the [place]  FDA and  has been authorized for detection and/or diagnosis of SARS-CoV-2 by FDA under an Emergency Use Authorization (EUA). This EUA will remain  in effect (meaning this test can be used) for the duration of the COVID-19 declaration under Section 564(b)(1) of the Act, 21 U.S.C.section 360bbb-3(b)(1), unless the authorization is terminated  or revoked sooner.       Influenza A by PCR NEGATIVE NEGATIVE Final   Influenza B by PCR NEGATIVE NEGATIVE Final    Comment: (NOTE) The Xpert Xpress SARS-CoV-2/FLU/RSV plus assay is intended as an aid in the diagnosis of influenza from Nasopharyngeal swab specimens and should not be used as a sole basis for treatment. Nasal washings and aspirates are unacceptable for Xpert Xpress SARS-CoV-2/FLU/RSV testing.  Fact Sheet for Patients: BloggerCourse.com  Fact Sheet for Healthcare Providers: SeriousBroker.it  This test is not yet approved or cleared by the [place]  FDA and has been authorized for detection and/or diagnosis of SARS-CoV-2 by FDA under an Emergency Use Authorization (EUA). This EUA will remain in effect (meaning this test can be used) for the duration of the COVID-19 declaration under Section 564(b)(1) of the Act, 21 U.S.C. section 360bbb-3(b)(1), unless the authorization is terminated or revoked.     Resp Syncytial Virus by PCR NEGATIVE NEGATIVE Final    Comment: (NOTE) Fact Sheet for Patients: BloggerCourse.com  Fact Sheet for Healthcare Providers: SeriousBroker.it  This test is not yet approved  or cleared by the [place]  FDA and has been authorized for detection and/or diagnosis of SARS-CoV-2 by FDA under an Emergency Use Authorization (EUA). This EUA will remain in effect (meaning this test can be used) for the duration of the COVID-19 declaration under Section 564(b)(1) of the Act, 21 U.S.C. section 360bbb-3(b)(1), unless the authorization is terminated or revoked.  Performed at [facility], [address], [place], [place] [address]     Time coordinating discharge: 45 minutes  Signed: Aminta Baldy Jaxan Michel  [facility] 10/10/2023, 5:04 PM

## 2023-10-10 NOTE — TOC Initial Note (Addendum)
 Transition of Care [facility]) - Initial/Assessment Note    Patient Details  Name: Susan Sanders MRN: [id number] Date of Birth: 21-Dec-1970  Transition of Care [facility]) CM/SW Contact:    Ruben Corolla, RN Phone Number: 10/10/2023, 11:14 AM  Clinical Narrative: d/c plan home.  -12:22p Patient agree to otpt PT if PT is needed @ home d/t insurance unable to get HHPT.                 Expected Discharge Plan: Home/Self Care Barriers to Discharge: Continued Medical Work up   Patient Goals and CMS Choice Patient states their goals for this hospitalization and ongoing recovery are:: Home CMS Medicare.gov Compare Post Acute Care list provided to:: Patient Choice offered to / list presented to : Patient Terminous ownership interest in [facility].provided to:: Patient    Expected Discharge Plan and Services   Discharge Planning Services: CM Consult   Living arrangements for the past 2 months: Single Family Home                                      Prior Living Arrangements/Services Living arrangements for the past 2 months: Single Family Home Lives with:: Adult Children   Do you feel safe going back to the place where you live?: Yes               Activities of Daily Living   ADL Screening (condition at time of admission) Independently performs ADLs?: Yes (appropriate for developmental age) Is the patient deaf or have difficulty hearing?: No Does the patient have difficulty seeing, even when wearing glasses/contacts?: Yes Does the patient have difficulty concentrating, remembering, or making decisions?: No  Permission Sought/Granted Permission sought to share information with : Case Manager Permission granted to share information with : Yes, Verbal Permission Granted  Share Information with NAME: Case manager           Emotional Assessment              Admission diagnosis:  Arm weakness [R29.898] Acute right-sided weakness [R53.1] Hypertensive  urgency [I16.0] Patient Active Problem List   Diagnosis Date Noted   Acute right-sided weakness 10/09/2023   Vomiting 07/12/2023   Grief 03/28/2022   Obesity 02/28/2022   Ganglion cyst 02/28/2022   Trapezius muscle spasm 10/13/2020   Acute right-sided low back pain without sciatica 09/12/2020   Seasonal allergies 08/25/2019   Leiomyoma of body of uterus 07/21/2018   Generalized anxiety disorder 06/03/2018   Overweight 11/02/2016   Essential hypertension 07/14/2015   Cervical spondylolysis 06/18/2014   GERD (gastroesophageal reflux disease) 12/10/2013   Menorrhagia with regular cycle 03/20/2007   Anxiety and depression 01/23/2007   PCP:  Santa Cuba, MD Pharmacy:   [id number] - [place],  - [address] [place] [address] Phone: [phone] Fax: [phone]     Social Drivers of Health (SDOH) Social History: SDOH Screenings   Food Insecurity: No Food Insecurity (10/10/2023)  Housing: Low Risk  (10/10/2023)  Transportation Needs: No Transportation Needs (10/10/2023)  Utilities: Not At Risk (10/10/2023)  Depression (PHQ2-9): Low Risk  (04/08/2023)  Tobacco Use: Medium Risk (10/10/2023)   SDOH Interventions:     Readmission Risk Interventions     No data to display

## 2023-10-10 NOTE — Progress Notes (Signed)
 Patient received discharge orders to go home. Patient received discharge paperwork/instructions and Stroke Education booklet. RN went over discharge paperwork/instructions with the patient. Any questions/concerns were addressed/answered to the best of RN's ability. Patient left the hospital stable, had discharge paperwork/instructions and Stroke Education booklet, and had all personal belongings.

## 2023-10-11 ENCOUNTER — Encounter: Payer: Self-pay | Admitting: Student

## 2023-10-11 ENCOUNTER — Ambulatory Visit: Admitting: Student

## 2023-10-11 VITALS — BP 159/69 | HR 90 | Ht 65.0 in

## 2023-10-11 DIAGNOSIS — I639 Cerebral infarction, unspecified: Secondary | ICD-10-CM

## 2023-10-11 DIAGNOSIS — I10 Essential (primary) hypertension: Secondary | ICD-10-CM

## 2023-10-11 DIAGNOSIS — E785 Hyperlipidemia, unspecified: Secondary | ICD-10-CM | POA: Diagnosis not present

## 2023-10-11 MED ORDER — LOSARTAN POTASSIUM-HCTZ 50-12.5 MG PO TABS: 1.0000 | ORAL_TABLET | Freq: Every day | ORAL | 0 refills | Status: DC

## 2023-10-11 NOTE — Assessment & Plan Note (Signed)
 BP: (!) 159/69 today. Poorly controlled. Goal of <130/80. Medication regimen: Amlodipine  10 mg daily, losartan -HCTZ 50-12.5 mg daily.  Reinforced that eplerenone  was discontinued.  Refill of Hyzaar sent to pharmacy.  Return in 1 week for med rec and HTN recheck.

## 2023-10-11 NOTE — Patient Instructions (Signed)
 It was great to see you today! Thank you for choosing [facility] for your primary care.  Today we addressed: Please follow your medications as listed.  Bring all of your medications to next appointment and we will follow-up for your blood pressure.  If you haven't already, sign up for My Chart to have easy access to your labs results, and communication with your primary care physician. I recommend that you always bring your medications to each appointment as this makes it easy to ensure you are on the correct medications and helps us  not miss refills when you need them. Return in about 1 week (around 10/18/2023) for Hypertension follow-up. Please arrive 15 minutes before your appointment to ensure smooth check in process.  We appreciate your efforts in making this happen.  Thank you for allowing me to participate in your care, Veronia Goon, DO 10/11/2023, 2:11 PM PGY-3, [facility]

## 2023-10-11 NOTE — Assessment & Plan Note (Signed)
 Goal per CVA, will need LDL recheck in 3 months.  Continue atorvastatin 80 mg daily.

## 2023-10-11 NOTE — Assessment & Plan Note (Addendum)
 Reviewed imaging and ED course, patient is appropriately taking Plavix and aspirin  although not following blood pressure regimen as discharged.  She is requesting FMLA paperwork for herself and daughter.  Residual right upper extremity weakness. - Referral to physical therapy - Complete FMLA paperwork for patient and daughter, will complete following week - Advised to schedule follow-up with neurology, patient already had appropriate follow-up information.

## 2023-10-11 NOTE — Progress Notes (Signed)
  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  She received stroke workup by neurology and was initiated on Plavix 75 mg daily x 3 weeks and baby aspirin  to be followed with monotherapy on aspirin  81 mg daily.  Additionally she was started on statin.  She experiences significant pain and heaviness in her right arm, which is the side affected by her recent stroke. 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 The stroke has primarily affected her arm, with no involvement of her leg or face.  Her current medications include Plavix, which she is taking daily for the next few weeks, baby aspirin , a cholesterol medication, amlodipine , and losartan  hydrochlorothiazide . She mistakenly took eplerenone , which she was advised to stop. She confirms taking Plavix, baby aspirin , and her cholesterol medication today.  PERTINENT  PMH / PSH: HTN, HLD, GERD, CVA  OBJECTIVE:  BP (!) 159/69   Pulse 90   Ht 5\' 5"  (1.651 m)   LMP 06/24/2018   SpO2 100%   BMI 33.57 kg/m  General: Fatigued appearing, NAD CV: RRR, no murmurs appreciable Neuro: 4/5 strength of right upper extremity, no focal neurological deficits otherwise  ASSESSMENT/PLAN:   Assessment & Plan Cerebrovascular accident (CVA), unspecified mechanism (HCC) Reviewed imaging and ED course, patient is appropriately taking Plavix and aspirin  although not following blood pressure regimen as discharged.  She is requesting FMLA paperwork for herself and daughter.  Residual right upper extremity weakness. - Referral to physical therapy - Complete FMLA paperwork for patient and daughter, will complete following week - Advised to schedule follow-up with neurology, patient already had appropriate follow-up information. Essential  hypertension BP: (!) 159/69 today. Poorly controlled. Goal of <130/80. Medication regimen: Amlodipine  10 mg daily, losartan -HCTZ 50-12.5 mg daily.  Reinforced that eplerenone  was discontinued.  Refill of Hyzaar sent to pharmacy.  Return in 1 week for med rec and HTN recheck. Hyperlipidemia LDL goal <70 Goal per CVA, will need LDL recheck in 3 months.  Continue atorvastatin 80 mg daily. Return in about 1 week (around 10/18/2023) for Hypertension follow-up. Veronia Goon, DO 10/11/2023, 3:13 PM PGY-3, [facility]

## 2023-10-11 NOTE — Consult Note (Signed)
 NEUROLOGY CONSULT NOTE   Date of service: Oct 11, 2023 Patient Name: Susan Sanders MRN:  [id number] DOB:  [birth year] Chief Complaint: "Right arm weakness" Requesting Provider: No att. providers found  History of Present Illness  [name] is a 53 y.o. female with hx of hypertension who presents with right arm numbness that has been going on since 5/26.  She was dropping things with the right hand, and also noted some slurred speech  LKW: 5/26 Modified rankin score: 0-Completely asymptomatic and back to baseline post- stroke IV Thrombolysis: No, outside of window EVT: No, outside of window  NIHSS components Score: Comment  1a Level of Conscious 0[x]  1[]  2[]  3[]      1b LOC Questions 0[x]  1[]  2[]       1c LOC Commands 0[x]  1[]  2[]       2 Best Gaze 0[x]  1[]  2[]       3 Visual 0[x]  1[]  2[]  3[]      4 Facial Palsy 0[]  1[x]  2[]  3[]      5a Motor Arm - left 0[x]  1[]  2[]  3[]  4[]  UN[]    5b Motor Arm - Right 0[]  1[x]  2[]  3[]  4[]  UN[]    6a Motor Leg - Left 0[x]  1[]  2[]  3[]  4[]  UN[]    6b Motor Leg - Right 0[x]  1[]  2[]  3[]  4[]  UN[]    7 Limb Ataxia 0[x]  1[]  2[]  UN[]      8 Sensory 0[x]  1[]  2[]  UN[]      9 Best Language 0[x]  1[]  2[]  3[]      10 Dysarthria 0[]  1[x]  2[]  UN[]      11 Extinct. and Inattention 0[x]  1[]  2[]       TOTAL: 3        Past History   Past Medical History:  Diagnosis Date   Allergy    Anemia    Anxiety    Cellulitis    Chest pain    Childhood asthma    Chronic back pain    "all over" (05/29/2018)   Complication of anesthesia    "difficult waking up " (03/30/2019)   Depression    Eczema    Hypertension    Scoliosis     Past Surgical History:  Procedure Laterality Date   ABDOMINAL HYSTERECTOMY     BREAST SURGERY     breast biopsy-right breast   HYSTERECTOMY ABDOMINAL WITH SALPINGO-OOPHORECTOMY Bilateral 07/21/2018   Procedure: TOTAL HYSTERECTOMY ABDOMINAL, BILATERAL SALPINGO-OOPHORECTOMY;  Surgeon: Lacretia Piccolo, MD;  Location: [facility];  Service: Gynecology;  Laterality: Bilateral;   TUBAL LIGATION      Family History: Family History  Problem Relation Age of Onset   Hypertension Mother    Bladder Cancer Mother    Breast cancer Maternal Aunt 62   Colon cancer Neg Hx    Esophageal cancer Neg Hx    Stomach cancer Neg Hx    Rectal cancer Neg Hx     Social History  reports that she quit smoking about 13 years ago. Her smoking use included cigarettes. She started smoking about 41 years ago. She has a 14 pack-year smoking history. She has been exposed to tobacco smoke. She has never used smokeless tobacco. She reports current alcohol use of about 2.0 standard drinks of alcohol per week. She reports that she does not currently use drugs after having used the following drugs: Marijuana.  Allergies  Allergen Reactions   Amoxicillin Shortness Of Breath, Itching and Swelling    Has patient had a PCN reaction causing immediate rash, facial/tongue/throat swelling, SOB  or lightheadedness with hypotension: Yes Has patient had a PCN reaction causing severe rash involving mucus membranes or skin necrosis: Yes Has patient had a PCN reaction that required hospitalization: Yes Has patient had a PCN reaction occurring within the last 10 years: No If all of the above answers are "NO", then may proceed with Cephalosporin use.    Latex Shortness Of Breath    Itching and burns skin   Naproxen  Hives and Itching    Takes IBU without difficulty   Tramadol  Hives, Itching and Other (See Comments)    Dysphoria - visual disturbance, hot sensatin    Medications  No current facility-administered medications for this encounter.  Current Outpatient Medications:    amLODipine  (NORVASC ) 10 MG tablet, Take 10 mg by mouth daily., Disp: , Rfl:    ondansetron  (ZOFRAN ) 4 MG tablet, Take 1 tablet (4 mg total) by mouth every 8 (eight) hours as needed for nausea or vomiting., Disp: 20 tablet, Rfl: 0   aspirin  EC 81 MG tablet, Take 1 tablet (81  mg total) by mouth daily. Swallow whole., Disp: 90 tablet, Rfl: 0   atorvastatin  (LIPITOR) 80 MG tablet, Take 1 tablet (80 mg total) by mouth daily., Disp: 90 tablet, Rfl: 0   clopidogrel  (PLAVIX ) 75 MG tablet, Take 1 tablet (75 mg total) by mouth daily., Disp: 21 tablet, Rfl: 0   losartan -hydrochlorothiazide  (HYZAAR) 50-12.5 MG tablet, Take 1 tablet by mouth daily., Disp: , Rfl:    MULTIPLE VITAMINS PO, Take by mouth. (Patient not taking: Reported on 07-Nov-2023), Disp: , Rfl:   Vitals   Vitals:   10/10/23 0414 10/10/23 0836 10/10/23 1201 10/10/23 1354  BP: (!) 148/69 (!) 190/81 (!) 191/77 (!) 176/75  Pulse: 78  78   Resp: 18     Temp: 98.1 F (36.7 C) 98.7 F (37.1 C) 98.5 F (36.9 C)   TempSrc: Oral Oral Oral   SpO2: 100% 100% 97%   Weight:      Height:        Body mass index is 33.57 kg/m.  Physical Exam   Constitutional: Appears well-developed and well-nourished.   Neurologic Examination    Neuro: Mental Status: Patient is awake, alert, oriented to person, place, month, year, and situation. Patient is able to give a clear and coherent history. No signs of aphasia or neglect She is very mildly dysarthric Cranial Nerves: II: Visual Fields are full. Pupils are equal, round, and reactive to light.   III,IV, VI: EOMI without ptosis or diploplia.  V: Facial sensation is symmetric to temperature VII: Facial movement with mild right facial weakness VIII: hearing is intact to voice X: Uvula elevates symmetrically XII: tongue is midline without atrophy or fasciculations.  Motor: Tone is normal. Bulk is normal.  She has mild weakness of the right upper, 4/5, good strength in bilateral lower extremities in the left upper extremity extremity sensory: Sensation is diminished in the right arm Cerebellar: No clear ataxia out of proportion to weakness       Labs/Imaging/Neurodiagnostic studies   CBC:  Recent Labs  Lab 11/07/23 1748 07-Nov-2023 1758  WBC 8.3  --    NEUTROABS 3.9  --   HGB 12.5 13.9  HCT 40.4 41.0  MCV 83.6  --   PLT 399  --    Basic Metabolic Panel:  Lab Results  Component Value Date   NA 139 07-Nov-2023   K 3.6 Nov 07, 2023   CO2 26 Nov 07, 2023   GLUCOSE 98 11/07/23   BUN 8  10/09/2023   CREATININE 0.90 10/09/2023   CALCIUM  9.3 10/09/2023   GFRNONAA >60 10/09/2023   GFRAA 96 01/04/2020   Lipid Panel:  Lab Results  Component Value Date   LDLCALC 125 (H) 10/10/2023   HgbA1c:  Lab Results  Component Value Date   HGBA1C 5.0 10/09/2023   Urine Drug Screen:     Component Value Date/Time   LABOPIA POSITIVE (A) 07/01/2018 1255   COCAINSCRNUR NONE DETECTED 07/01/2018 1255   LABBENZ NONE DETECTED 07/01/2018 1255   AMPHETMU NONE DETECTED 07/01/2018 1255   THCU POSITIVE (A) 07/01/2018 1255   LABBARB NONE DETECTED 07/01/2018 1255     MRI Brain(Personally reviewed): Subcortical, likely small vessel stroke, left  CTA without any significant stenosis  LDL 125 A1c 5.0  ASSESSMENT   Darene Nappi is a 53 y.o. female with subcortical stroke on the left which I suspect is likely secondary to intrinsic vessel disease.  She will need secondary risk factor modification, she has hypertension and hyperlipidemia and these will need to be managed.  She is outside the permissive hypertension window, and so could go ahead and start her medications.  She will need dual antiplatelet therapy for 3 weeks followed by monotherapy.  RECOMMENDATIONS  Aspirin  and Plavix  for 3 weeks followed by monotherapy Continue atorvastatin  80 mg nightly Follow recommendations by PT/OT Neurology will be available as needed. ______________________________________________________________________    Flint Hummer, MD [facility]

## 2023-10-14 ENCOUNTER — Ambulatory Visit: Payer: Self-pay | Admitting: Student

## 2023-10-14 ENCOUNTER — Telehealth: Payer: Self-pay | Admitting: Student

## 2023-10-14 NOTE — Telephone Encounter (Signed)
 Completed FMLA paperwork for patient and daughter. Copies made and placed in batch level scanning. Forms are in envelopes in my inbox in case patient presents sooner than scheduled 6/10 visit or sees different provider.

## 2023-10-21 NOTE — Progress Notes (Unsigned)
  SUBJECTIVE:   CHIEF COMPLAINT / HPI:   Returns today for med rec and HTN follow up. Forgot her medications today but was able to recite her medications exactly.  She has made changes to her diet and is trying to exercise more in addition to being adherent to her medications.  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  She states her mood was low directly after her stroke but has been improving especially with the support of her family.  Hypertension: Home medications include: Amlodipine  10mg  daily, losartan -hydrochlorothiazide  50-12.5mg  daily. She endorses taking these medications as prescribed. Does check blood pressure at home ranging 110s-130s/60s-70s. Patient has had a BMP in the past 1 year.  PERTINENT  PMH / PSH: HTN, HLD, GERD, CVA   OBJECTIVE:  BP 133/75   Pulse 79   Ht 5\' 5"  (1.651 m)   Wt 197 lb 6.4 oz (89.5 kg)   LMP 06/24/2018   SpO2 98%   BMI 32.85 kg/m  Gen: well-appearing, NAD CV: RRR, no murmurs appreciable  ASSESSMENT/PLAN:   Assessment & Plan Cerebrovascular accident (CVA), unspecified mechanism (HCC) Right-sided weakness improving significantly with home exercises and strict self-motivation.  Amazingly has made dietary changes, recommend Mediterranean diet.  Provided FMLA forms for patient and her daughter.  Mood stable.  Return in 2 months for LDL check and stroke follow-up. Essential hypertension BP: 133/75 today. Improved control. Goal of <130/80, home checks are better than office checks. Continue to work on healthy dietary habits and exercise. Medication regimen: Amlodipine  10 mg daily, losartan -HCTZ 50-12.5 mg daily.  Consider spironolactone /eplerenone  in the future if necessary  Return in about 2 months (around 12/22/2023) for Recheck LDL, stroke f/u.  Veronia Goon, DO 10/22/2023, 9:23 AM PGY-3, [facility]

## 2023-10-22 ENCOUNTER — Encounter: Payer: Self-pay | Admitting: Student

## 2023-10-22 ENCOUNTER — Ambulatory Visit: Admitting: Student

## 2023-10-22 VITALS — BP 133/75 | HR 79 | Ht 65.0 in | Wt 197.4 lb

## 2023-10-22 DIAGNOSIS — I10 Essential (primary) hypertension: Secondary | ICD-10-CM | POA: Diagnosis not present

## 2023-10-22 DIAGNOSIS — I639 Cerebral infarction, unspecified: Secondary | ICD-10-CM | POA: Diagnosis not present

## 2023-10-22 NOTE — Assessment & Plan Note (Signed)
 BP: 133/75 today. Improved control. Goal of <130/80, home checks are better than office checks. Continue to work on healthy dietary habits and exercise. Medication regimen: Amlodipine  10 mg daily, losartan -HCTZ 50-12.5 mg daily.  Consider spironolactone /eplerenone  in the future if necessary

## 2023-10-22 NOTE — Patient Instructions (Addendum)
 It was great to see you today! Thank you for choosing [facility] for your primary care.  Today we addressed: If you are making dietary changes, I recommend the mediterranean diet. Great job on your blood pressure!  I'll send a message to our referrals person regarding the PT. You were given the FMLA forms today.  If you haven't already, sign up for My Chart to have easy access to your labs results, and communication with your primary care physician.  Return in about 2 months (around 12/22/2023) for Recheck LDL, stroke f/u. Please arrive 15 minutes before your appointment to ensure smooth check in process.  We appreciate your efforts in making this happen.  Thank you for allowing me to participate in your care, Veronia Goon, DO 10/22/2023, 9:13 AM PGY-3, [facility]

## 2023-10-22 NOTE — Assessment & Plan Note (Signed)
 Right-sided weakness improving significantly with home exercises and strict self-motivation.  Amazingly has made dietary changes, recommend Mediterranean diet.  Provided FMLA forms for patient and her daughter.  Mood stable.  Return in 2 months for LDL check and stroke follow-up.

## 2023-10-24 DIAGNOSIS — Z419 Encounter for procedure for purposes other than remedying health state, unspecified: Secondary | ICD-10-CM | POA: Diagnosis not present

## 2023-11-07 ENCOUNTER — Telehealth: Payer: Self-pay | Admitting: Family Medicine

## 2023-11-07 ENCOUNTER — Encounter: Payer: Self-pay | Admitting: Physical Therapy

## 2023-11-07 ENCOUNTER — Ambulatory Visit: Attending: Family Medicine | Admitting: Physical Therapy

## 2023-11-07 ENCOUNTER — Other Ambulatory Visit: Payer: Self-pay

## 2023-11-07 DIAGNOSIS — R2689 Other abnormalities of gait and mobility: Secondary | ICD-10-CM | POA: Insufficient documentation

## 2023-11-07 DIAGNOSIS — R2681 Unsteadiness on feet: Secondary | ICD-10-CM | POA: Insufficient documentation

## 2023-11-07 DIAGNOSIS — R293 Abnormal posture: Secondary | ICD-10-CM | POA: Insufficient documentation

## 2023-11-07 DIAGNOSIS — I639 Cerebral infarction, unspecified: Secondary | ICD-10-CM | POA: Insufficient documentation

## 2023-11-07 DIAGNOSIS — M6281 Muscle weakness (generalized): Secondary | ICD-10-CM | POA: Insufficient documentation

## 2023-11-07 NOTE — Telephone Encounter (Signed)
 I called Ms. Susan Sanders to discuss her medications.   I clarified that she should be done taking the Plavix  as she has finished 3 weeks of this and reiterated that she should continue her aspirin .   Also clarified that she should continue taking atorvastatin , amlodipine , and hyzaar. She repeated back to me her medication regimen and did not have any further questions.

## 2023-11-07 NOTE — Therapy (Signed)
 OUTPATIENT PHYSICAL THERAPY NEURO EVALUATION   Patient Name: Susan Sanders MRN: [id number] DOB:05/16/1970, 53 y.o., female Today's Date: 11/07/2023   PCP: Nicholas Bar, MD REFERRING PROVIDER: Delores Suzann HERO, MD   END OF SESSION:  PT End of Session - 11/07/23 1044     Visit Number 1    Number of Visits 9    Date for PT Re-Evaluation 12/13/23    Authorization Type [place] [employer]-auth required    PT Start Time 1053    PT Stop Time 1142    PT Time Calculation (min) 49 min    Activity Tolerance Patient tolerated treatment well    Behavior During Therapy [facility] for tasks assessed/performed;Flat affect   tearful         Past Medical History:  Diagnosis Date   Allergy    Anemia    Anxiety    Cellulitis    Chest pain    Childhood asthma    Chronic back pain    all over (05/29/2018)   Complication of anesthesia    difficult waking up  (03/30/2019)   Depression    Eczema    Hypertension    Scoliosis    Past Surgical History:  Procedure Laterality Date   ABDOMINAL HYSTERECTOMY     BREAST SURGERY     breast biopsy-right breast   HYSTERECTOMY ABDOMINAL WITH SALPINGO-OOPHORECTOMY Bilateral 07/21/2018   Procedure: TOTAL HYSTERECTOMY ABDOMINAL, BILATERAL SALPINGO-OOPHORECTOMY;  Surgeon: Rockney Evalene SQUIBB, MD;  Location: [facility];  Service: Gynecology;  Laterality: Bilateral;   TUBAL LIGATION     Patient Active Problem List   Diagnosis Date Noted   Hyperlipidemia LDL goal <70 10/11/2023   Stroke (HCC) 10/09/2023   Vomiting 07/12/2023   Grief 03/28/2022   Obesity 02/28/2022   Ganglion cyst 02/28/2022   Trapezius muscle spasm 10/13/2020   Acute right-sided low back pain without sciatica 09/12/2020   Seasonal allergies 08/25/2019   Leiomyoma of body of uterus 07/21/2018   Generalized anxiety disorder 06/03/2018   Overweight 11/02/2016   Essential hypertension 07/14/2015   Cervical spondylolysis 06/18/2014   GERD (gastroesophageal reflux  disease) 12/10/2013   Menorrhagia with regular cycle 03/20/2007   Anxiety and depression 01/23/2007    ONSET DATE: 10/11/2023  REFERRING DIAG: I63.9 (ICD-10-CM) - Cerebrovascular accident (CVA), unspecified mechanism (HCC)   THERAPY DIAG:  Muscle weakness (generalized)  Unsteadiness on feet  Other abnormalities of gait and mobility  Abnormal posture  Rationale for Evaluation and Treatment: Rehabilitation  SUBJECTIVE:  SUBJECTIVE STATEMENT: Want to help with my arm-it feels like it hangs down like it is heavy, doesn't feel like it used to feel.  Pt is right handed.  Can tell a difference on the leg as well.  R side feels slower and heavier.  I'm just frustrated with this. 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 She received stroke workup by neurology and was initiated on Plavix  75 mg daily x 3 weeks and baby aspirin  to be followed with monotherapy on aspirin  81 mg daily. Additionally she was started on statin.   PAIN:  Are you having pain? Yes: NPRS scale: 3-4/10 Pain location: R hand Pain description: tired, heavy  Aggravating factors: lying down Relieving factors: propping the hand  PRECAUTIONS: Fall, not currently driving  RED FLAGS: None   WEIGHT BEARING RESTRICTIONS: No  FALLS: Has patient fallen in last 6 months? No  LIVING ENVIRONMENT: Lives with: lives with their family Lives in: House/apartment Stairs: 2 steps to enter Has following equipment at home: None  PLOF: Independent, worked as Lawyer  PATIENT GOALS: For my arm and my leg to get stronger  OBJECTIVE:  Note: Objective measures were completed  at Evaluation unless otherwise noted.  DIAGNOSTIC FINDINGS: cute nonhemorrhagic infarct in the anterior left hippocampus via MRI brain  COGNITION: Overall cognitive status: Within functional limits for tasks assessed   SENSATION: Light touch: Impaired  R L L  COORDINATION: Decreased coordination, slowed movement in RUE/RLE  MUSCLE TONE: RLE: Mild   POSTURE: rounded shoulders, forward head, posterior pelvic tilt, and flexed trunk   LOWER EXTREMITY ROM:     Active  Right Eval Left Eval  Hip flexion    Hip extension    Hip abduction    Hip adduction    Hip internal rotation    Hip external rotation    Knee flexion    Knee extension    Ankle dorsiflexion    Ankle plantarflexion    Ankle inversion    Ankle eversion     (Blank rows = not tested)  LOWER EXTREMITY MMT:    MMT Right Eval Left Eval  Hip flexion 4 5  Hip extension    Hip abduction    Hip adduction    Hip internal rotation    Hip external rotation    Knee flexion 4 5  Knee extension 4 5  Ankle dorsiflexion 4 5  Ankle plantarflexion    Ankle inversion    Ankle eversion    (Blank rows = not tested)   TRANSFERS: Sit to stand: Modified independence  Assistive device utilized: None     Stand to sit: Modified independence  Assistive device utilized: None      GAIT: Findings: Gait Characteristics: step through pattern, decreased arm swing- Right, decreased step length- Right, and decreased step length- Left, Distance walked: 100 ft, Assistive device utilized:None, Level of assistance: Modified independence and SBA, and Comments: overall slowed gait pattern  FUNCTIONAL TESTS:  5 times sit to stand: 22.19 Timed up and go (TUG): 15.04 sec 10 meter walk test: 13.06 sec (2.5 ft/sec) DGI:  12/24 (Scores <19/24/ indicate increased fall risk)  [facility] PT Assessment - 11/07/23 1124       Standardized Balance Assessment   Standardized Balance Assessment Dynamic Gait Index      Dynamic Gait Index   Level  Surface  Moderate Impairment   8.78 sec with decr R UE swing   Change in Gait Speed Mild Impairment    Gait with Horizontal Head Turns Moderate Impairment    Gait with Vertical Head Turns Moderate Impairment    Gait and Pivot Turn Mild Impairment    Step Over Obstacle Moderate Impairment    Step Around Obstacles Mild Impairment    Steps Mild Impairment    Total Score 12                                                                                                                                       TREATMENT DATE: 11/07/2023    PATIENT EDUCATION: Education details: Eval results, POC; initial HEP, posture and positioning and weightbearing for RUE; OT referral Person educated: Patient Education method: Explanation, Demonstration, and Handouts Education comprehension: verbalized understanding, returned demonstration, and needs further education  HOME EXERCISE PROGRAM: Access Code: [email or web address] URL: [email or web address] Date: 11/07/2023 Prepared by: [facility] - Outpatient  Rehab - [facility]  Exercises - Sit to stand in stride stance  - 1 x daily - 7 x weekly - 2 sets - 10 reps - Seated Pelvic Tilt  - 1-2 x daily - 7 x weekly - 1-2 sets - 10 reps  GOALS: Goals reviewed with patient? Yes  SHORT TERM GOALS: = LTGs  LONG TERM GOALS: Target date: 12/13/2023  Pt will be independent with HEP for improved strength, balance, gait. Baseline: no current formal HEP Goal status: INITIAL  2.  Pt will improve 5x sit<>stand to less than or equal to 11 sec to demonstrate improved functional strength and transfer efficiency.  Baseline: 22 sec Goal status: INITIAL  3.  Pt will improve DGI score to at least 19/24 to decrease fall risk. Baseline: 12/24 Goal status: INITIAL  4.  Pt will improve gait velocity to at least 3 ft/sec for improved gait efficiency and safety.  Baseline: 2.5 ft/sec Goal status: INITIAL  5.  Pt will improve TUG score to less than or equal to  13.5 sec for decreased fall risk. Baseline: 15 sec Goal status: INITIAL  ASSESSMENT:  CLINICAL IMPRESSION: Patient is a 53 y.o. female who was seen today for physical therapy evaluation and to initiate treatment for CVA.  She has hx of CVA with R sided weakness 10/09/23, and she notes RUE weakness and heaviness is her main concern.  She does note decreased strength RLE as well as fatigue and slower mobility.  Prior to CVA, she was independent and worked as Lawyer.  She presents to OPPT today with decreased RUE and RLE strength, sensation, decreased balance, decreased coordination, abnormal posture, abnormal gait.  She is at fall risk per DGI, FTSTS and TUG tests.  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  She would benefit from skilled PT to address the above stated deficits to decrease fall risk and improve overall functional mobility  and independence.  *Discussed OT referral and benefits of OT, given RUE as a main concern of hers after the CVA.  OBJECTIVE IMPAIRMENTS: Abnormal gait, decreased balance, decreased mobility, decreased strength, impaired UE functional use, and postural dysfunction.   ACTIVITY LIMITATIONS: carrying, standing, transfers, reach over head, locomotion level, and caring for others  PARTICIPATION LIMITATIONS: meal prep, cleaning, laundry, driving, shopping, community activity, and occupation  PERSONAL FACTORS: 3+ comorbidities: see above are also affecting patient's functional outcome.   REHAB POTENTIAL: Good  CLINICAL DECISION MAKING: Evolving/moderate complexity  EVALUATION COMPLEXITY: Moderate  PLAN:  PT FREQUENCY: 2x/week  PT DURATION: 4 weeks  PLANNED INTERVENTIONS: 97750- Physical Performance Testing, 97110-Therapeutic exercises, 97530- Therapeutic activity, 97112- Neuromuscular re-education, 97535- Self Care, 02859- Manual therapy, [phone]- Gait training, Patient/Family education, and Balance training  PLAN FOR NEXT SESSION: Progress HEP  for RLE strengthening; ask about OT referral getting scheduled; RLE strengthening with WB, balance, gait activities   Pieper Kasik W., PT 11/07/2023, 12:49 PM  [facility] at [facility] [address], [address] [place], [place] [address] Phone # [phone] Fax # [phone]  For all possible CPT codes, reference the Planned Interventions line above.     Check all conditions that are expected to impact treatment: {Conditions expected to impact treatment:Structural or anatomic abnormalities   If treatment provided at initial evaluation, no treatment charged due to lack of authorization.

## 2023-11-07 NOTE — Telephone Encounter (Signed)
 Patient came in asking if her doctor or someone could please call her to discuss her medication. She wants to know if she is still supposed to be taking the calcium  and her Plavix . If she supposed to still be taking Plavix , she will need a refill sent in

## 2023-11-13 NOTE — Therapy (Incomplete)
 OUTPATIENT PHYSICAL THERAPY NEURO TREATMENT   Patient Name: Susan Sanders MRN: [id number] DOB:1970/10/05, 53 y.o., female Today's Date: 11/13/2023   PCP: Nicholas Bar, MD REFERRING PROVIDER: Delores Suzann HERO, MD   END OF SESSION:    Past Medical History:  Diagnosis Date   Allergy    Anemia    Anxiety    Cellulitis    Chest pain    Childhood asthma    Chronic back pain    all over (05/29/2018)   Complication of anesthesia    difficult waking up  (03/30/2019)   Depression    Eczema    Hypertension    Scoliosis    Past Surgical History:  Procedure Laterality Date   ABDOMINAL HYSTERECTOMY     BREAST SURGERY     breast biopsy-right breast   HYSTERECTOMY ABDOMINAL WITH SALPINGO-OOPHORECTOMY Bilateral 07/21/2018   Procedure: TOTAL HYSTERECTOMY ABDOMINAL, BILATERAL SALPINGO-OOPHORECTOMY;  Surgeon: Rockney Evalene SQUIBB, MD;  Location: [facility];  Service: Gynecology;  Laterality: Bilateral;   TUBAL LIGATION     Patient Active Problem List   Diagnosis Date Noted   Hyperlipidemia LDL goal <70 10/11/2023   Stroke (HCC) 10/09/2023   Vomiting 07/12/2023   Grief 03/28/2022   Obesity 02/28/2022   Ganglion cyst 02/28/2022   Trapezius muscle spasm 10/13/2020   Acute right-sided low back pain without sciatica 09/12/2020   Seasonal allergies 08/25/2019   Leiomyoma of body of uterus 07/21/2018   Generalized anxiety disorder 06/03/2018   Overweight 11/02/2016   Essential hypertension 07/14/2015   Cervical spondylolysis 06/18/2014   GERD (gastroesophageal reflux disease) 12/10/2013   Menorrhagia with regular cycle 03/20/2007   Anxiety and depression 01/23/2007    ONSET DATE: 10/11/2023  REFERRING DIAG: I63.9 (ICD-10-CM) - Cerebrovascular accident (CVA), unspecified mechanism (HCC)   THERAPY DIAG:  No diagnosis found.  Rationale for Evaluation and Treatment: Rehabilitation  SUBJECTIVE:                                                                                                                                                                                              SUBJECTIVE STATEMENT: Want to help with my arm-it feels like it hangs down like it is heavy, doesn't feel like it used to feel.  Pt is right handed.  Can tell a difference on the leg as well.  R side feels slower and heavier.  I'm just frustrated with this. 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 She received stroke  workup by neurology and was initiated on Plavix  75 mg daily x 3 weeks and baby aspirin  to be followed with monotherapy on aspirin  81 mg daily. Additionally she was started on statin.   PAIN:  Are you having pain? Yes: NPRS scale: 3-4/10 Pain location: R hand Pain description: tired, heavy  Aggravating factors: lying down Relieving factors: propping the hand  PRECAUTIONS: Fall, not currently driving  RED FLAGS: None   WEIGHT BEARING RESTRICTIONS: No  FALLS: Has patient fallen in last 6 months? No  LIVING ENVIRONMENT: Lives with: lives with their family Lives in: House/apartment Stairs: 2 steps to enter Has following equipment at home: None  PLOF: Independent, worked as Lawyer  PATIENT GOALS: For my arm and my leg to get stronger  OBJECTIVE:     TODAY'S TREATMENT: 11/14/23 Activity Comments                         Note: Objective measures were completed at Evaluation unless otherwise noted.  DIAGNOSTIC FINDINGS: cute nonhemorrhagic infarct in the anterior left hippocampus via MRI brain  COGNITION: Overall cognitive status: Within functional limits for tasks assessed   SENSATION: Light touch: Impaired  R L L  COORDINATION: Decreased coordination, slowed movement in RUE/RLE  MUSCLE TONE: RLE: Mild   POSTURE: rounded shoulders, forward head, posterior  pelvic tilt, and flexed trunk   LOWER EXTREMITY ROM:     Active  Right Eval Left Eval  Hip flexion    Hip extension    Hip abduction    Hip adduction    Hip internal rotation    Hip external rotation    Knee flexion    Knee extension    Ankle dorsiflexion    Ankle plantarflexion    Ankle inversion    Ankle eversion     (Blank rows = not tested)  LOWER EXTREMITY MMT:    MMT Right Eval Left Eval  Hip flexion 4 5  Hip extension    Hip abduction    Hip adduction    Hip internal rotation    Hip external rotation    Knee flexion 4 5  Knee extension 4 5  Ankle dorsiflexion 4 5  Ankle plantarflexion    Ankle inversion    Ankle eversion    (Blank rows = not tested)   TRANSFERS: Sit to stand: Modified independence  Assistive device utilized: None     Stand to sit: Modified independence  Assistive device utilized: None      GAIT: Findings: Gait Characteristics: step through pattern, decreased arm swing- Right, decreased step length- Right, and decreased step length- Left, Distance walked: 100 ft, Assistive device utilized:None, Level of assistance: Modified independence and SBA, and Comments: overall slowed gait pattern  FUNCTIONAL TESTS:  5 times sit to stand: 22.19 Timed up and go (TUG): 15.04 sec 10 meter walk test: 13.06 sec (2.5 ft/sec) DGI:  12/24 (Scores <19/24/ indicate increased fall risk)  TREATMENT DATE: 11/07/2023    PATIENT EDUCATION: Education details: Eval results, POC; initial HEP, posture and positioning and weightbearing for RUE; OT referral Person educated: Patient Education method: Explanation, Demonstration, and Handouts Education comprehension: verbalized understanding, returned demonstration, and needs further education  HOME EXERCISE PROGRAM: Access Code: [email or web address] URL: [email or web address] Date:  11/07/2023 Prepared by: [facility] - Outpatient  Rehab - [facility]  Exercises - Sit to stand in stride stance  - 1 x daily - 7 x weekly - 2 sets - 10 reps - Seated Pelvic Tilt  - 1-2 x daily - 7 x weekly - 1-2 sets - 10 reps  GOALS: Goals reviewed with patient? Yes  SHORT TERM GOALS: = LTGs  LONG TERM GOALS: Target date: 12/13/2023  Pt will be independent with HEP for improved strength, balance, gait. Baseline: no current formal HEP Goal status: IN PROGRESS  2.  Pt will improve 5x sit<>stand to less than or equal to 11 sec to demonstrate improved functional strength and transfer efficiency.  Baseline: 22 sec Goal status: IN PROGRESS  3.  Pt will improve DGI score to at least 19/24 to decrease fall risk. Baseline: 12/24 Goal status: IN PROGRESS  4.  Pt will improve gait velocity to at least 3 ft/sec for improved gait efficiency and safety.  Baseline: 2.5 ft/sec Goal status: IN PROGRESS  5.  Pt will improve TUG score to less than or equal to 13.5 sec for decreased fall risk. Baseline: 15 sec Goal status: IN PROGRESS  ASSESSMENT:  CLINICAL IMPRESSION: Patient is a 53 y.o. female who was seen today for physical therapy evaluation and to initiate treatment for CVA.  She has hx of CVA with R sided weakness 10/09/23, and she notes RUE weakness and heaviness is her main concern.  She does note decreased strength RLE as well as fatigue and slower mobility.  Prior to CVA, she was independent and worked as Lawyer.  She presents to OPPT today with decreased RUE and RLE strength, sensation, decreased balance, decreased coordination, abnormal posture, abnormal gait.  She is at fall risk per DGI, FTSTS and TUG tests.  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  She would benefit from skilled PT to address the above stated deficits to decrease fall risk and improve overall functional mobility and independence.  *Discussed OT referral and benefits of OT, given RUE as a main  concern of hers after the CVA.  OBJECTIVE IMPAIRMENTS: Abnormal gait, decreased balance, decreased mobility, decreased strength, impaired UE functional use, and postural dysfunction.   ACTIVITY LIMITATIONS: carrying, standing, transfers, reach over head, locomotion level, and caring for others  PARTICIPATION LIMITATIONS: meal prep, cleaning, laundry, driving, shopping, community activity, and occupation  PERSONAL FACTORS: 3+ comorbidities: see above are also affecting patient's functional outcome.   REHAB POTENTIAL: Good  CLINICAL DECISION MAKING: Evolving/moderate complexity  EVALUATION COMPLEXITY: Moderate  PLAN:  PT FREQUENCY: 2x/week  PT DURATION: 4 weeks  PLANNED INTERVENTIONS: 97750- Physical Performance Testing, 97110-Therapeutic exercises, 97530- Therapeutic activity, 97112- Neuromuscular re-education, 97535- Self Care, 02859- Manual therapy, [phone]- Gait training, Patient/Family education, and Balance training  PLAN FOR NEXT SESSION: Progress HEP for RLE strengthening; ask about OT referral getting scheduled; RLE strengthening with WB, balance, gait activities   Slater MARLA Christians, PT 11/13/2023, 3:32 PM  [place] [facility] at [facility] [address], [address] [place], [place] [address] Phone # [phone] Fax # [phone]  For all possible CPT codes, reference the Planned Interventions line  above.     Check all conditions that are expected to impact treatment: {Conditions expected to impact treatment:Structural or anatomic abnormalities   If treatment provided at initial evaluation, no treatment charged due to lack of authorization.

## 2023-11-14 ENCOUNTER — Ambulatory Visit: Admitting: Physical Therapy

## 2023-11-19 ENCOUNTER — Ambulatory Visit: Attending: Family Medicine | Admitting: Physical Therapy

## 2023-11-19 ENCOUNTER — Encounter: Payer: Self-pay | Admitting: Physical Therapy

## 2023-11-19 DIAGNOSIS — R293 Abnormal posture: Secondary | ICD-10-CM | POA: Insufficient documentation

## 2023-11-19 DIAGNOSIS — R2689 Other abnormalities of gait and mobility: Secondary | ICD-10-CM | POA: Diagnosis not present

## 2023-11-19 DIAGNOSIS — R2681 Unsteadiness on feet: Secondary | ICD-10-CM | POA: Insufficient documentation

## 2023-11-19 DIAGNOSIS — M6281 Muscle weakness (generalized): Secondary | ICD-10-CM | POA: Insufficient documentation

## 2023-11-19 NOTE — Therapy (Signed)
 OUTPATIENT PHYSICAL THERAPY NEURO TREATMENT   Patient Name: Susan Sanders MRN: [id number] DOB:1970-11-02, 53 y.o., female Today's Date: 11/19/2023   PCP: Nicholas Bar, MD REFERRING PROVIDER: Delores Suzann HERO, MD   END OF SESSION:  PT End of Session - 11/19/23 0847     Visit Number 2    Number of Visits 9    Date for PT Re-Evaluation 12/13/23    Authorization Type [place] [employer]    Authorization Time Period 11/07/2023-01/06/2024    Authorization - Visit Number 1    Authorization - Number of Visits 12    PT Start Time 0848    PT Stop Time 0927    PT Time Calculation (min) 39 min    Activity Tolerance Patient tolerated treatment well    Behavior During Therapy [facility] for tasks assessed/performed           Past Medical History:  Diagnosis Date   Allergy    Anemia    Anxiety    Cellulitis    Chest pain    Childhood asthma    Chronic back pain    all over (05/29/2018)   Complication of anesthesia    difficult waking up  (03/30/2019)   Depression    Eczema    Hypertension    Scoliosis    Past Surgical History:  Procedure Laterality Date   ABDOMINAL HYSTERECTOMY     BREAST SURGERY     breast biopsy-right breast   HYSTERECTOMY ABDOMINAL WITH SALPINGO-OOPHORECTOMY Bilateral 07/21/2018   Procedure: TOTAL HYSTERECTOMY ABDOMINAL, BILATERAL SALPINGO-OOPHORECTOMY;  Surgeon: Rockney Evalene SQUIBB, MD;  Location: [facility];  Service: Gynecology;  Laterality: Bilateral;   TUBAL LIGATION     Patient Active Problem List   Diagnosis Date Noted   Hyperlipidemia LDL goal <70 10/11/2023   Stroke (HCC) 10/09/2023   Vomiting 07/12/2023   Grief 03/28/2022   Obesity 02/28/2022   Ganglion cyst 02/28/2022   Trapezius muscle spasm 10/13/2020   Acute right-sided low back pain without sciatica 09/12/2020   Seasonal allergies 08/25/2019   Leiomyoma of body of uterus 07/21/2018   Generalized anxiety disorder 06/03/2018   Overweight 11/02/2016   Essential  hypertension 07/14/2015   Cervical spondylolysis 06/18/2014   GERD (gastroesophageal reflux disease) 12/10/2013   Menorrhagia with regular cycle 03/20/2007   Anxiety and depression 01/23/2007    ONSET DATE: 10/11/2023  REFERRING DIAG: I63.9 (ICD-10-CM) - Cerebrovascular accident (CVA), unspecified mechanism (HCC)   THERAPY DIAG:  Unsteadiness on feet  Other abnormalities of gait and mobility  Muscle weakness (generalized)  Rationale for Evaluation and Treatment: Rehabilitation  SUBJECTIVE:  SUBJECTIVE STATEMENT: Been working on some of the exercises.  Been trying the Mediterranean diet.  Been walking 1 hr in the mornings and afternoon. 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 She received stroke workup by neurology and was initiated on Plavix  75 mg daily x 3 weeks and baby aspirin  to be followed with monotherapy on aspirin  81 mg daily. Additionally she was started on statin.   PAIN:  Are you having pain? Yes: NPRS scale: 3/10 Pain location: R shoulder Pain description: stiff  Aggravating factors: lying down Relieving factors: propping the hand, heat, positioning with pillows  PRECAUTIONS: Fall, not currently driving  RED FLAGS: None   WEIGHT BEARING RESTRICTIONS: No  FALLS: Has patient fallen in last 6 months? No  LIVING ENVIRONMENT: Lives with: lives with their family Lives in: House/apartment Stairs: 2 steps to enter Has following equipment at home: None  PLOF: Independent, worked as Lawyer  PATIENT GOALS: For my arm and my leg to get stronger  OBJECTIVE:     TODAY'S TREATMENT: 11/19/2023 Activity  Comments  Reviewed HEP Good return demo  Supine trunk rotation, 5 reps, 15   Supine scapular retraction, 5 reps   Supine Bilat shoulder flexion using cane, 2 x 5 reps   Sidelying open book stretch With assist  NuStep, Level 2, 4 extremities x 6 minutes   Gait training:  using canes to facilitate reciprocal arm swing; removed canes and continued arm swing 50 ft x 8 reps  Backwards gait 20 ft x 4 reps   RLE as stance with LLE step taps x 10   Heel/toe raises x 10    PATIENT EDUCATION: Education details:Pt to follow up on asking MD for OT referral at her MD visit 7/9; updates to HEP Person educated: Patient Education method: Explanation, Demonstration, and Handouts Education comprehension: verbalized understanding, returned demonstration, and needs further education  HOME EXERCISE PROGRAM: Access Code: [email or web address] URL: [email or web address] Date: 11/19/2023 Prepared by: [facility] - Outpatient  Rehab - [facility]  Exercises - Sit to stand in stride stance  - 1 x daily - 7 x weekly - 2 sets - 10 reps - Seated Pelvic Tilt  - 1-2 x daily - 7 x weekly - 1-2 sets - 10 reps - Supine Lower Trunk Rotation  - 1 x daily - 7 x weekly - 1 sets - 5-10 reps - 15 sec hold - Supine Scapular Retraction  - 1 x daily - 7 x weekly - 2 sets - 10 reps -  3 sec hold - Supine Shoulder Flexion Extension AAROM with Dowel  - 1 x daily - 7 x weekly - 2 sets - 10 reps  --------------------------------------  Note: Objective measures were completed at Evaluation unless otherwise noted.  DIAGNOSTIC FINDINGS: cute nonhemorrhagic infarct in the anterior left hippocampus via MRI brain  COGNITION: Overall cognitive status: Within functional limits for tasks assessed   SENSATION: Light touch: Impaired  R L L  COORDINATION: Decreased coordination, slowed movement in RUE/RLE  MUSCLE TONE: RLE: Mild   POSTURE: rounded shoulders, forward head, posterior pelvic tilt, and flexed trunk   LOWER  EXTREMITY ROM:     Active  Right Eval Left Eval  Hip flexion    Hip extension    Hip abduction    Hip adduction    Hip internal rotation    Hip  external rotation    Knee flexion    Knee extension    Ankle dorsiflexion    Ankle plantarflexion    Ankle inversion    Ankle eversion     (Blank rows = not tested)  LOWER EXTREMITY MMT:    MMT Right Eval Left Eval  Hip flexion 4 5  Hip extension    Hip abduction    Hip adduction    Hip internal rotation    Hip external rotation    Knee flexion 4 5  Knee extension 4 5  Ankle dorsiflexion 4 5  Ankle plantarflexion    Ankle inversion    Ankle eversion    (Blank rows = not tested)   TRANSFERS: Sit to stand: Modified independence  Assistive device utilized: None     Stand to sit: Modified independence  Assistive device utilized: None      GAIT: Findings: Gait Characteristics: step through pattern, decreased arm swing- Right, decreased step length- Right, and decreased step length- Left, Distance walked: 100 ft, Assistive device utilized:None, Level of assistance: Modified independence and SBA, and Comments: overall slowed gait pattern  FUNCTIONAL TESTS:  5 times sit to stand: 22.19 Timed up and go (TUG): 15.04 sec 10 meter walk test: 13.06 sec (2.5 ft/sec) DGI:  12/24 (Scores <19/24/ indicate increased fall risk)                                                                                                                                 TREATMENT DATE: 11/07/2023    PATIENT EDUCATION: Education details: Eval results, POC; initial HEP, posture and positioning and weightbearing for RUE; OT referral Person educated: Patient Education method: Explanation, Demonstration, and Handouts Education comprehension: verbalized understanding, returned demonstration, and needs further education  HOME EXERCISE PROGRAM: Access Code: [email or web address] URL: [email or web address] Date: 11/07/2023 Prepared by: [facility] - Outpatient   Rehab - [facility]  Exercises - Sit to stand in stride stance  - 1 x daily - 7 x weekly - 2 sets - 10 reps - Seated Pelvic Tilt  - 1-2 x daily - 7 x weekly - 1-2 sets - 10 reps  GOALS: Goals reviewed with patient? Yes  SHORT TERM GOALS: = LTGs  LONG TERM GOALS: Target date: 12/13/2023  Pt will be independent with HEP for improved strength, balance, gait. Baseline: no current formal HEP Goal status: IN PROGRESS  2.  Pt will improve 5x sit<>stand to less than or equal to 11 sec to demonstrate improved functional strength and transfer efficiency.  Baseline: 22 sec Goal status: IN PROGRESS  3.  Pt will improve DGI score to at least 19/24 to decrease fall risk. Baseline: 12/24 Goal status: IN PROGRESS  4.  Pt will improve gait velocity to at least 3 ft/sec for improved gait efficiency and safety.  Baseline: 2.5 ft/sec Goal status: IN PROGRESS  5.  Pt will improve TUG score to less than or equal to  13.5 sec for decreased fall risk. Baseline: 15 sec Goal status: IN PROGRESS  ASSESSMENT:  CLINICAL IMPRESSION: Pt presents today with no new complaints; reports RUE does feel better, but still tight under her shoulder. Skilled PT session focused on postural and trunk exercises to facilitate best positioning with RUE as well as strengthening and gait activities. Pt has improved R arm swing since eval; she needs occasional cues for technique with updated exercises.  She has no complaints at end of session.  Pt will continue to benefit from skilled PT towards goals for improved functional mobility and decreased fall risk.   OBJECTIVE IMPAIRMENTS: Abnormal gait, decreased balance, decreased mobility, decreased strength, impaired UE functional use, and postural dysfunction.   ACTIVITY LIMITATIONS: carrying, standing, transfers, reach over head, locomotion level, and caring for others  PARTICIPATION LIMITATIONS: meal prep, cleaning, laundry, driving, shopping, community activity,  and occupation  PERSONAL FACTORS: 3+ comorbidities: see above are also affecting patient's functional outcome.   REHAB POTENTIAL: Good  CLINICAL DECISION MAKING: Evolving/moderate complexity  EVALUATION COMPLEXITY: Moderate  PLAN:  PT FREQUENCY: 2x/week  PT DURATION: 4 weeks  PLANNED INTERVENTIONS: 97750- Physical Performance Testing, 97110-Therapeutic exercises, 97530- Therapeutic activity, 97112- Neuromuscular re-education, 97535- Self Care, 02859- Manual therapy, [id number]- Gait training, Patient/Family education, and Balance training  PLAN FOR NEXT SESSION: Progress HEP for RLE strengthening and balance; ask about OT referral; RLE strengthening with WB, balance, gait activities   Via Rosado W., PT 11/19/2023, 9:27 AM  [facility] at [facility] [address] [place], [place] [address] Phone # [phone] Fax # [phone]  For all possible CPT codes, reference the Planned Interventions line above.     Check all conditions that are expected to impact treatment: {Conditions expected to impact treatment:Structural or anatomic abnormalities   If treatment provided at initial evaluation, no treatment charged due to lack of authorization.

## 2023-11-20 ENCOUNTER — Ambulatory Visit (INDEPENDENT_AMBULATORY_CARE_PROVIDER_SITE_OTHER): Admitting: Family Medicine

## 2023-11-20 VITALS — BP 136/76 | HR 91 | Ht 65.0 in | Wt 204.2 lb

## 2023-11-20 DIAGNOSIS — I639 Cerebral infarction, unspecified: Secondary | ICD-10-CM

## 2023-11-20 DIAGNOSIS — I10 Essential (primary) hypertension: Secondary | ICD-10-CM

## 2023-11-20 NOTE — Progress Notes (Signed)
    SUBJECTIVE:   CHIEF COMPLAINT / HPI:    Back to work letter  Patient had a stroke in 09/2023 in anterior left hippocampus. She was on aspirin  and plavix  for 3 weeks and now only aspirin  81 mg as well as a statin. She was enrolled in physical therapy for defecits of RUE and RLE weakness, abnormal gait, and abnormal coordination. Last PT note on 11/19/2023 recommended continued therapy for at least another 4 weeks with twice a week sessions with target date of long term goals 12/13/2023. They do note improved functioning, but continued PT recommended.  Activity limitations listed by PT are as follows: carrying, standing, transfers, reach over head, locomotion level, and caring for others  Patient says that she would like to go to work in two weeks after her neurology appointment at reduced time of 8 hours per shift instead of 12.  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   PERTINENT  PMH / PSH: HTN, Ischemic CVA, HLD  OBJECTIVE:   BP 136/76   Pulse 91   Ht 5' 5 (1.651 m)   Wt 204 lb 3.2 oz (92.6 kg)   LMP 06/24/2018   SpO2 100%   BMI 33.98 kg/m   General: well appearing, in no acute distress CV: RRR, radial pulses equal and palpable, no BLE edema  Resp: Normal work of breathing on room air, CTAB Abd: Soft, non tender, non distended  Neuro: Alert & Oriented x 4, PERRLA, EOMI, CN2-12 intact, strength RUE 5/5, LUE 5/5, RLE 4.5/5, LLE 5/5  Nose to finger test appropriate without tremor, alternating hand motions rapid intact  Gait slightly abnormal with lagging or right leg.    ASSESSMENT/PLAN:   Assessment & Plan Cerebrovascular accident (CVA), unspecified mechanism (HCC) Recommended to patient that we wait until the beginning of August to return to work given recommendations and notes by physical therapist and the intense requirements of her job.  Patient agreed.  - Referral for occupational therapy for  balance and coordination per recommendations of physical therapist as well.  - Continue physical therapy  - Follow up at end of July  Essential hypertension Almost at goal. Patient did not take one of her blood pressure medicines yet.  - Recommended patient fill blood pressure log and bring to follow up.      Areta Saliva, MD [facility]

## 2023-11-20 NOTE — Patient Instructions (Addendum)
 It was wonderful to see you today.  Please bring ALL of your medications with you to every visit.   Today we talked about:  Work - I recommend you continue physical therapy and occupational therapy ( I have put in a referral) for a couple weeks before going back to work. We can follow up on July 29th. Please continue the exercises they recommended. You're doing a great job!!   Blood pressure - Your blood pressure is just slightly over what we would like given your history of stroke. Please log your blood pressure measurements every day and bring the log next time you come in to clinic.   Blood Pressure Record Sheet To take your blood pressure, you will need a blood pressure machine. You can buy a blood pressure machine (blood pressure monitor) at your clinic, drug store, or online. When choosing one, consider: An automatic monitor that has an arm cuff. A cuff that wraps snugly around your upper arm. You should be able to fit only one finger between your arm and the cuff. A device that stores blood pressure reading results. Do not choose a monitor that measures your blood pressure from your wrist or finger. Follow your health care provider's instructions for how to take your blood pressure. To use this form: Take your blood pressure medications every day These measurements should be taken when you have been at rest for at least 10-15 min Take at least 2 readings with each blood pressure check. This makes sure the results are correct. Wait 1-2 minutes between measurements. Write down the results in the spaces on this form. Keep in mind it should always be recorded systolic over diastolic. Both numbers are important.  Repeat this every day for 2-3 weeks, or as told by your health care provider.  Make a follow-up appointment with your health care provider to discuss the results.  Blood Pressure Log Date Medications taken? (Y/N) Blood Pressure Time of Day                                                                                                         Thank you for choosing [facility].   Please call [phone] with any questions about today's appointment.  Areta Saliva, MD  Family Medicine

## 2023-11-21 NOTE — Assessment & Plan Note (Signed)
 Recommended to patient that we wait until the beginning of August to return to work given recommendations and notes by physical therapist and the intense requirements of her job.  Patient agreed.  - Referral for occupational therapy for balance and coordination per recommendations of physical therapist as well.  - Continue physical therapy  - Follow up at end of July

## 2023-11-21 NOTE — Therapy (Incomplete)
 OUTPATIENT PHYSICAL THERAPY NEURO TREATMENT   Patient Name: Susan Sanders MRN: [id number] DOB:[birth year], 53 y.o., female Today's Date: 11/21/2023   PCP: Nicholas Bar, MD REFERRING PROVIDER: Delores Suzann HERO, MD   END OF SESSION:     Past Medical History:  Diagnosis Date   Allergy    Anemia    Anxiety    Cellulitis    Chest pain    Childhood asthma    Chronic back pain    all over (05/29/2018)   Complication of anesthesia    difficult waking up  (03/30/2019)   Depression    Eczema    Hypertension    Scoliosis    Past Surgical History:  Procedure Laterality Date   ABDOMINAL HYSTERECTOMY     BREAST SURGERY     breast biopsy-right breast   HYSTERECTOMY ABDOMINAL WITH SALPINGO-OOPHORECTOMY Bilateral 07/21/2018   Procedure: TOTAL HYSTERECTOMY ABDOMINAL, BILATERAL SALPINGO-OOPHORECTOMY;  Surgeon: Rockney Evalene SQUIBB, MD;  Location: [facility];  Service: Gynecology;  Laterality: Bilateral;   TUBAL LIGATION     Patient Active Problem List   Diagnosis Date Noted   Hyperlipidemia LDL goal <70 10/11/2023   Stroke (HCC) 10/09/2023   Vomiting 07/12/2023   Grief 03/28/2022   Obesity 02/28/2022   Ganglion cyst 02/28/2022   Trapezius muscle spasm 10/13/2020   Acute right-sided low back pain without sciatica 09/12/2020   Seasonal allergies 08/25/2019   Leiomyoma of body of uterus 07/21/2018   Generalized anxiety disorder 06/03/2018   Overweight 11/02/2016   Essential hypertension 07/14/2015   Cervical spondylolysis 06/18/2014   GERD (gastroesophageal reflux disease) 12/10/2013   Menorrhagia with regular cycle 03/20/2007   Anxiety and depression 01/23/2007    ONSET DATE: 10/11/2023  REFERRING DIAG: I63.9 (ICD-10-CM) - Cerebrovascular accident (CVA), unspecified mechanism (HCC)   THERAPY DIAG:  No diagnosis found.  Rationale for Evaluation and Treatment: Rehabilitation  SUBJECTIVE:                                                                                                                                                                                              SUBJECTIVE STATEMENT: Been working on some of the exercises.  Been trying the Mediterranean diet.  Been walking 1 hr in the mornings and afternoon. 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 She received stroke workup by neurology and was initiated on Plavix  75 mg daily x 3 weeks and baby aspirin  to be followed with monotherapy on aspirin  81 mg  daily. Additionally she was started on statin.   PAIN:  Are you having pain? Yes: NPRS scale: 3/10 Pain location: R shoulder Pain description: stiff  Aggravating factors: lying down Relieving factors: propping the hand, heat, positioning with pillows  PRECAUTIONS: Fall, not currently driving  RED FLAGS: None   WEIGHT BEARING RESTRICTIONS: No  FALLS: Has patient fallen in last 6 months? No  LIVING ENVIRONMENT: Lives with: lives with their family Lives in: House/apartment Stairs: 2 steps to enter Has following equipment at home: None  PLOF: Independent, worked as Lawyer  PATIENT GOALS: For my arm and my leg to get stronger  OBJECTIVE:      TODAY'S TREATMENT: 11/22/23 Activity Comments                           TODAY'S TREATMENT: 11/19/2023 Activity Comments  Reviewed HEP Good return demo  Supine trunk rotation, 5 reps, 15   Supine scapular retraction, 5 reps   Supine Bilat shoulder flexion using cane, 2 x 5 reps   Sidelying open book stretch With assist  NuStep, Level 2, 4 extremities x 6 minutes   Gait training:  using canes to facilitate reciprocal arm swing; removed canes and continued arm swing 50 ft x 8 reps  Backwards gait 20 ft x 4 reps   RLE as stance with LLE step taps x 10   Heel/toe raises x 10    PATIENT  EDUCATION: Education details:Pt to follow up on asking MD for OT referral at her MD visit 7/9; updates to HEP Person educated: Patient Education method: Explanation, Demonstration, and Handouts Education comprehension: verbalized understanding, returned demonstration, and needs further education     HOME EXERCISE PROGRAM: Access Code: [email or web address] URL: [email or web address] Date: 11/19/2023 Prepared by: [facility] - Outpatient  Rehab - [facility]  Exercises - Sit to stand in stride stance  - 1 x daily - 7 x weekly - 2 sets - 10 reps - Seated Pelvic Tilt  - 1-2 x daily - 7 x weekly - 1-2 sets - 10 reps - Supine Lower Trunk Rotation  - 1 x daily - 7 x weekly - 1 sets - 5-10 reps - 15 sec hold - Supine Scapular Retraction  - 1 x daily - 7 x weekly - 2 sets - 10 reps -  3 sec hold - Supine Shoulder Flexion Extension AAROM with Dowel  - 1 x daily - 7 x weekly - 2 sets - 10 reps  --------------------------------------  Note: Objective measures were completed at Evaluation unless otherwise noted.  DIAGNOSTIC FINDINGS: cute nonhemorrhagic infarct in the anterior left hippocampus via MRI brain  COGNITION: Overall cognitive status: Within functional limits for tasks assessed   SENSATION: Light touch: Impaired  R L L  COORDINATION: Decreased coordination, slowed movement in RUE/RLE  MUSCLE TONE: RLE: Mild   POSTURE: rounded shoulders, forward head, posterior pelvic tilt, and flexed trunk   LOWER EXTREMITY ROM:     Active  Right Eval Left Eval  Hip flexion    Hip extension    Hip abduction    Hip adduction    Hip internal rotation    Hip external rotation    Knee flexion    Knee extension    Ankle dorsiflexion    Ankle plantarflexion    Ankle inversion    Ankle eversion     (Blank rows = not tested)  LOWER EXTREMITY MMT:    MMT Right Eval Left Eval  Hip flexion 4 5  Hip extension    Hip abduction    Hip adduction    Hip internal rotation     Hip external rotation    Knee flexion 4 5  Knee extension 4 5  Ankle dorsiflexion 4 5  Ankle plantarflexion    Ankle inversion    Ankle eversion    (Blank rows = not tested)   TRANSFERS: Sit to stand: Modified independence  Assistive device utilized: None     Stand to sit: Modified independence  Assistive device utilized: None      GAIT: Findings: Gait Characteristics: step through pattern, decreased arm swing- Right, decreased step length- Right, and decreased step length- Left, Distance walked: 100 ft, Assistive device utilized:None, Level of assistance: Modified independence and SBA, and Comments: overall slowed gait pattern  FUNCTIONAL TESTS:  5 times sit to stand: 22.19 Timed up and go (TUG): 15.04 sec 10 meter walk test: 13.06 sec (2.5 ft/sec) DGI:  12/24 (Scores <19/24/ indicate increased fall risk)                                                                                                                                 TREATMENT DATE: 11/07/2023    PATIENT EDUCATION: Education details: Eval results, POC; initial HEP, posture and positioning and weightbearing for RUE; OT referral Person educated: Patient Education method: Explanation, Demonstration, and Handouts Education comprehension: verbalized understanding, returned demonstration, and needs further education  HOME EXERCISE PROGRAM: Access Code: [email or web address] URL: [email or web address] Date: 11/07/2023 Prepared by: [facility] - Outpatient  Rehab - [facility]  Exercises - Sit to stand in stride stance  - 1 x daily - 7 x weekly - 2 sets - 10 reps - Seated Pelvic Tilt  - 1-2 x daily - 7 x weekly - 1-2 sets - 10 reps  GOALS: Goals reviewed with patient? Yes  SHORT TERM GOALS: = LTGs  LONG TERM GOALS: Target date: 12/13/2023  Pt will be independent with HEP for improved strength, balance, gait. Baseline: no current formal HEP Goal status: IN PROGRESS  2.  Pt will improve 5x sit<>stand  to less than or equal to 11 sec to demonstrate improved functional strength and transfer efficiency.  Baseline: 22 sec Goal status: IN PROGRESS  3.  Pt will improve DGI score to at least 19/24 to decrease fall risk. Baseline: 12/24 Goal status: IN PROGRESS  4.  Pt will improve gait velocity to at least 3 ft/sec for improved gait efficiency and safety.  Baseline: 2.5 ft/sec Goal status: IN PROGRESS  5.  Pt will improve TUG score to less than or equal to 13.5 sec for decreased fall risk. Baseline: 15 sec Goal status: IN PROGRESS  ASSESSMENT:  CLINICAL IMPRESSION: Pt presents today with no new complaints; reports RUE does feel better, but still tight under her shoulder. Skilled PT session focused on postural and trunk exercises to facilitate best positioning with RUE as well  as strengthening and gait activities. Pt has improved R arm swing since eval; she needs occasional cues for technique with updated exercises.  She has no complaints at end of session.  Pt will continue to benefit from skilled PT towards goals for improved functional mobility and decreased fall risk.   OBJECTIVE IMPAIRMENTS: Abnormal gait, decreased balance, decreased mobility, decreased strength, impaired UE functional use, and postural dysfunction.   ACTIVITY LIMITATIONS: carrying, standing, transfers, reach over head, locomotion level, and caring for others  PARTICIPATION LIMITATIONS: meal prep, cleaning, laundry, driving, shopping, community activity, and occupation  PERSONAL FACTORS: 3+ comorbidities: see above are also affecting patient's functional outcome.   REHAB POTENTIAL: Good  CLINICAL DECISION MAKING: Evolving/moderate complexity  EVALUATION COMPLEXITY: Moderate  PLAN:  PT FREQUENCY: 2x/week  PT DURATION: 4 weeks  PLANNED INTERVENTIONS: 97750- Physical Performance Testing, 97110-Therapeutic exercises, 97530- Therapeutic activity, [id number]- Neuromuscular re-education, 97535- Self Care, 02859- Manual  therapy, [phone]- Gait training, Patient/Family education, and Balance training  PLAN FOR NEXT SESSION: Progress HEP for RLE strengthening and balance; ask about OT referral; RLE strengthening with WB, balance, gait activities

## 2023-11-21 NOTE — Assessment & Plan Note (Signed)
 Almost at goal. Patient did not take one of her blood pressure medicines yet.  - Recommended patient fill blood pressure log and bring to follow up.

## 2023-11-22 ENCOUNTER — Ambulatory Visit: Admitting: Physical Therapy

## 2023-11-23 DIAGNOSIS — Z419 Encounter for procedure for purposes other than remedying health state, unspecified: Secondary | ICD-10-CM | POA: Diagnosis not present

## 2023-11-26 ENCOUNTER — Ambulatory Visit: Admitting: Physical Therapy

## 2023-11-26 NOTE — Therapy (Signed)
 OUTPATIENT PHYSICAL THERAPY NEURO TREATMENT   Patient Name: Susan Sanders MRN: [id number] DOB:1971-03-15, 53 y.o., female Today's Date: 11/27/2023   PCP: Nicholas Bar, MD REFERRING PROVIDER: Delores Suzann HERO, MD   END OF SESSION:  PT End of Session - 11/27/23 1353     Visit Number 3    Number of Visits 9    Date for PT Re-Evaluation 12/13/23    Authorization Type [place] [employer]    Authorization Time Period 11/07/2023-01/06/2024    Authorization - Visit Number 2    Authorization - Number of Visits 12    PT Start Time 1319    PT Stop Time 1358    PT Time Calculation (min) 39 min    Equipment Utilized During Treatment Gait belt    Activity Tolerance Patient tolerated treatment well    Behavior During Therapy WFL for tasks assessed/performed            Past Medical History:  Diagnosis Date   Allergy    Anemia    Anxiety    Cellulitis    Chest pain    Childhood asthma    Chronic back pain    all over (05/29/2018)   Complication of anesthesia    difficult waking up  (03/30/2019)   Depression    Eczema    Hypertension    Scoliosis    Past Surgical History:  Procedure Laterality Date   ABDOMINAL HYSTERECTOMY     BREAST SURGERY     breast biopsy-right breast   HYSTERECTOMY ABDOMINAL WITH SALPINGO-OOPHORECTOMY Bilateral 07/21/2018   Procedure: TOTAL HYSTERECTOMY ABDOMINAL, BILATERAL SALPINGO-OOPHORECTOMY;  Surgeon: Rockney Evalene SQUIBB, MD;  Location: [facility];  Service: Gynecology;  Laterality: Bilateral;   TUBAL LIGATION     Patient Active Problem List   Diagnosis Date Noted   Hyperlipidemia LDL goal <70 10/11/2023   Stroke (HCC) 10/09/2023   Vomiting 07/12/2023   Grief 03/28/2022   Obesity 02/28/2022   Ganglion cyst 02/28/2022   Trapezius muscle spasm 10/13/2020   Acute right-sided low back pain without sciatica 09/12/2020   Seasonal allergies 08/25/2019   Leiomyoma of body of uterus 07/21/2018   Generalized anxiety disorder  06/03/2018   Overweight 11/02/2016   Essential hypertension 07/14/2015   Cervical spondylolysis 06/18/2014   GERD (gastroesophageal reflux disease) 12/10/2013   Menorrhagia with regular cycle 03/20/2007   Anxiety and depression 01/23/2007    ONSET DATE: 10/11/2023  REFERRING DIAG: I63.9 (ICD-10-CM) - Cerebrovascular accident (CVA), unspecified mechanism (HCC)   THERAPY DIAG:  Unsteadiness on feet  Other abnormalities of gait and mobility  Muscle weakness (generalized)  Abnormal posture  Rationale for Evaluation and Treatment: Rehabilitation  SUBJECTIVE:  SUBJECTIVE STATEMENT: My daughter had to bring me today. Nothing new. R shoulder pain is better. Have been doing her exercises and walking.   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 She received stroke workup by neurology and was initiated on Plavix  75 mg daily x 3 weeks and baby aspirin  to be followed with monotherapy on aspirin  81 mg daily. Additionally she was started on statin.   PAIN:  Are you having pain? Yes: NPRS scale: 2-3/10 Pain location: R shoulder Pain description: stiff  Aggravating factors: lying down Relieving factors: propping the hand, heat, positioning with pillows  PRECAUTIONS: Fall, not currently driving  RED FLAGS: None   WEIGHT BEARING RESTRICTIONS: No  FALLS: Has patient fallen in last 6 months? No  LIVING ENVIRONMENT: Lives with: lives with their family Lives in: House/apartment Stairs: 2 steps to enter Has following equipment at home: None  PLOF: Independent, worked as Lawyer  PATIENT GOALS: For my arm and my leg to get  stronger  OBJECTIVE:      TODAY'S TREATMENT: 11/27/23 Activity Comments  STS EC 5x Good stability   STS on foam x5, with EC Good stability; mild-mod instability with EC  R step ups + opposite SKTC 10x each Mild instability on R LE; CGA  1 foot on dynadisc on step + alt UE raise, then with head turns/nods  Good stability   Toe tapping to 3 cones  Mild-mod instability, worse on R LE  Nustep L5 x UEs/LEs Maintaining ~75SPM        PATIENT EDUCATION: Education details: discussed OT referral, discussed potential for using AD on walks- she reports feeling steady; HEP update with edu for safety Person educated: Patient Education method: Explanation, Demonstration, Tactile cues, Verbal cues, and Handouts Education comprehension: verbalized understanding and returned demonstration     HOME EXERCISE PROGRAM: Access Code: [email or web address] URL: [email or web address] Date: 11/27/2023 Prepared by: [facility] - Outpatient  Rehab - [facility]  Program Notes perform standing exercises near counter top for safety  Exercises - Sit to stand in stride stance  - 1 x daily - 7 x weekly - 2 sets - 10 reps - Seated Pelvic Tilt  - 1-2 x daily - 7 x weekly - 1-2 sets - 10 reps - Supine Lower Trunk Rotation  - 1 x daily - 7 x weekly - 1 sets - 5-10 reps - 15 sec hold - Supine Scapular Retraction  - 1 x daily - 7 x weekly - 2 sets - 10 reps -  3 sec hold - Supine Shoulder Flexion Extension AAROM with Dowel  - 1 x daily - 7 x weekly - 2 sets - 10 reps - Runner's Step Up/Down  - 1 x daily - 5 x weekly - 2 sets - 10 reps - Standing Toe Taps  - 1 x daily - 5 x weekly - 2 sets - 10 reps  --------------------------------------  Note: Objective measures were completed at Evaluation unless otherwise noted.  DIAGNOSTIC FINDINGS: cute nonhemorrhagic infarct in the anterior left hippocampus via MRI brain  COGNITION: Overall cognitive status: Within functional limits for tasks  assessed   SENSATION: Light touch: Impaired  R L L  COORDINATION: Decreased coordination, slowed movement in RUE/RLE  MUSCLE TONE: RLE: Mild   POSTURE: rounded shoulders, forward head, posterior pelvic tilt, and flexed trunk   LOWER EXTREMITY  ROM:     Active  Right Eval Left Eval  Hip flexion    Hip extension    Hip abduction    Hip adduction    Hip internal rotation    Hip external rotation    Knee flexion    Knee extension    Ankle dorsiflexion    Ankle plantarflexion    Ankle inversion    Ankle eversion     (Blank rows = not tested)  LOWER EXTREMITY MMT:    MMT Right Eval Left Eval  Hip flexion 4 5  Hip extension    Hip abduction    Hip adduction    Hip internal rotation    Hip external rotation    Knee flexion 4 5  Knee extension 4 5  Ankle dorsiflexion 4 5  Ankle plantarflexion    Ankle inversion    Ankle eversion    (Blank rows = not tested)   TRANSFERS: Sit to stand: Modified independence  Assistive device utilized: None     Stand to sit: Modified independence  Assistive device utilized: None      GAIT: Findings: Gait Characteristics: step through pattern, decreased arm swing- Right, decreased step length- Right, and decreased step length- Left, Distance walked: 100 ft, Assistive device utilized:None, Level of assistance: Modified independence and SBA, and Comments: overall slowed gait pattern  FUNCTIONAL TESTS:  5 times sit to stand: 22.19 Timed up and go (TUG): 15.04 sec 10 meter walk test: 13.06 sec (2.5 ft/sec) DGI:  12/24 (Scores <19/24/ indicate increased fall risk)                                                                                                                                 TREATMENT DATE: 11/07/2023    PATIENT EDUCATION: Education details: Eval results, POC; initial HEP, posture and positioning and weightbearing for RUE; OT referral Person educated: Patient Education method: Explanation, Demonstration, and  Handouts Education comprehension: verbalized understanding, returned demonstration, and needs further education  HOME EXERCISE PROGRAM: Access Code: [email or web address] URL: [email or web address] Date: 11/07/2023 Prepared by: [facility] - Outpatient  Rehab - [facility]  Exercises - Sit to stand in stride stance  - 1 x daily - 7 x weekly - 2 sets - 10 reps - Seated Pelvic Tilt  - 1-2 x daily - 7 x weekly - 1-2 sets - 10 reps  GOALS: Goals reviewed with patient? Yes  SHORT TERM GOALS: = LTGs  LONG TERM GOALS: Target date: 12/13/2023  Pt will be independent with HEP for improved strength, balance, gait. Baseline: no current formal HEP Goal status: IN PROGRESS  2.  Pt will improve 5x sit<>stand to less than or equal to 11 sec to demonstrate improved functional strength and transfer efficiency.  Baseline: 22 sec Goal status: IN PROGRESS  3.  Pt will improve DGI score to at least 19/24 to decrease fall risk. Baseline: 12/24 Goal status: IN PROGRESS  4.  Pt will improve gait velocity to at least 3 ft/sec for improved gait efficiency and safety.  Baseline: 2.5 ft/sec Goal status: IN PROGRESS  5.  Pt will improve TUG score to less than or equal to 13.5 sec for decreased fall risk. Baseline: 15 sec Goal status: IN PROGRESS  ASSESSMENT:  CLINICAL IMPRESSION: Patient arrived to session with report of improvement in R shoulder pain. Session focused on progressive balance training incorporating compliant surface training, SLS, and stepping strategy. Patient tolerated these exercises well and demonstrated good ability to eliciting balance reflexes as needed. Most imbalance today was evident with R SLS activities. HEP was updated for increased challenge with exercises that were well-tolerated today. No complaints upon leaving.   OBJECTIVE IMPAIRMENTS: Abnormal gait, decreased balance, decreased mobility, decreased strength, impaired UE functional use, and postural dysfunction.    ACTIVITY LIMITATIONS: carrying, standing, transfers, reach over head, locomotion level, and caring for others  PARTICIPATION LIMITATIONS: meal prep, cleaning, laundry, driving, shopping, community activity, and occupation  PERSONAL FACTORS: 3+ comorbidities: see above are also affecting patient's functional outcome.   REHAB POTENTIAL: Good  CLINICAL DECISION MAKING: Evolving/moderate complexity  EVALUATION COMPLEXITY: Moderate  PLAN:  PT FREQUENCY: 2x/week  PT DURATION: 4 weeks  PLANNED INTERVENTIONS: 97750- Physical Performance Testing, 97110-Therapeutic exercises, 97530- Therapeutic activity, 97112- Neuromuscular re-education, 97535- Self Care, 02859- Manual therapy, [phone]- Gait training, Patient/Family education, and Balance training  PLAN FOR NEXT SESSION: Progress HEP for RLE strengthening and balance;  RLE strengthening with WB, balance, gait activities     Louana Terrilyn Christians, [place], DPT 11/27/23 1:59 PM  [facility] [address] [place], [place] [address] Phone # [phone] Fax # [phone]

## 2023-11-27 ENCOUNTER — Encounter: Payer: Self-pay | Admitting: Physical Therapy

## 2023-11-27 ENCOUNTER — Ambulatory Visit: Admitting: Physical Therapy

## 2023-11-27 DIAGNOSIS — R2689 Other abnormalities of gait and mobility: Secondary | ICD-10-CM

## 2023-11-27 DIAGNOSIS — R2681 Unsteadiness on feet: Secondary | ICD-10-CM

## 2023-11-27 DIAGNOSIS — M6281 Muscle weakness (generalized): Secondary | ICD-10-CM | POA: Diagnosis not present

## 2023-11-27 DIAGNOSIS — R293 Abnormal posture: Secondary | ICD-10-CM

## 2023-11-28 NOTE — Therapy (Incomplete)
 OUTPATIENT PHYSICAL THERAPY NEURO TREATMENT   Patient Name: Susan Sanders MRN: [id number] DOB:07/12/1970, 53 y.o., female Today's Date: 11/28/2023   PCP: Nicholas Bar, MD REFERRING PROVIDER: Delores Suzann HERO, MD   END OF SESSION:      Past Medical History:  Diagnosis Date   Allergy    Anemia    Anxiety    Cellulitis    Chest pain    Childhood asthma    Chronic back pain    all over (05/29/2018)   Complication of anesthesia    difficult waking up  (03/30/2019)   Depression    Eczema    Hypertension    Scoliosis    Past Surgical History:  Procedure Laterality Date   ABDOMINAL HYSTERECTOMY     BREAST SURGERY     breast biopsy-right breast   HYSTERECTOMY ABDOMINAL WITH SALPINGO-OOPHORECTOMY Bilateral 07/21/2018   Procedure: TOTAL HYSTERECTOMY ABDOMINAL, BILATERAL SALPINGO-OOPHORECTOMY;  Surgeon: Rockney Evalene SQUIBB, MD;  Location: [facility];  Service: Gynecology;  Laterality: Bilateral;   TUBAL LIGATION     Patient Active Problem List   Diagnosis Date Noted   Hyperlipidemia LDL goal <70 10/11/2023   Stroke (HCC) 10/09/2023   Vomiting 07/12/2023   Grief 03/28/2022   Obesity 02/28/2022   Ganglion cyst 02/28/2022   Trapezius muscle spasm 10/13/2020   Acute right-sided low back pain without sciatica 09/12/2020   Seasonal allergies 08/25/2019   Leiomyoma of body of uterus 07/21/2018   Generalized anxiety disorder 06/03/2018   Overweight 11/02/2016   Essential hypertension 07/14/2015   Cervical spondylolysis 06/18/2014   GERD (gastroesophageal reflux disease) 12/10/2013   Menorrhagia with regular cycle 03/20/2007   Anxiety and depression 01/23/2007    ONSET DATE: 10/11/2023  REFERRING DIAG: I63.9 (ICD-10-CM) - Cerebrovascular accident (CVA), unspecified mechanism (HCC)   THERAPY DIAG:  No diagnosis found.  Rationale for Evaluation and Treatment: Rehabilitation  SUBJECTIVE:                                                                                                                                                                                              SUBJECTIVE STATEMENT: My daughter had to bring me today. Nothing new. R shoulder pain is better. Have been doing her exercises and walking.   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 She received stroke workup by neurology and was initiated on Plavix  75 mg daily x 3 weeks and baby aspirin  to be followed with monotherapy on aspirin  81  mg daily. Additionally she was started on statin.   PAIN:  Are you having pain? Yes: NPRS scale: 2-3/10 Pain location: R shoulder Pain description: stiff  Aggravating factors: lying down Relieving factors: propping the hand, heat, positioning with pillows  PRECAUTIONS: Fall, not currently driving  RED FLAGS: None   WEIGHT BEARING RESTRICTIONS: No  FALLS: Has patient fallen in last 6 months? No  LIVING ENVIRONMENT: Lives with: lives with their family Lives in: House/apartment Stairs: 2 steps to enter Has following equipment at home: None  PLOF: Independent, worked as Lawyer  PATIENT GOALS: For my arm and my leg to get stronger  OBJECTIVE:      TODAY'S TREATMENT: 11/29/23 Activity Comments                         TODAY'S TREATMENT: 11/27/23 Activity Comments  STS EC 5x Good stability   STS on foam x5, with EC Good stability; mild-mod instability with EC  R step ups + opposite SKTC 10x each Mild instability on R LE; CGA  1 foot on dynadisc on step + alt UE raise, then with head turns/nods  Good stability   Toe tapping to 3 cones  Mild-mod instability, worse on R LE  Nustep L5 x UEs/LEs Maintaining ~75SPM        PATIENT EDUCATION: Education details: discussed OT referral, discussed potential for using AD on walks- she reports  feeling steady; HEP update with edu for safety Person educated: Patient Education method: Explanation, Demonstration, Tactile cues, Verbal cues, and Handouts Education comprehension: verbalized understanding and returned demonstration     HOME EXERCISE PROGRAM: Access Code: [email or web address] URL: [email or web address] Date: 11/27/2023 Prepared by: [facility] - Outpatient  Rehab - [facility]  Program Notes perform standing exercises near counter top for safety  Exercises - Sit to stand in stride stance  - 1 x daily - 7 x weekly - 2 sets - 10 reps - Seated Pelvic Tilt  - 1-2 x daily - 7 x weekly - 1-2 sets - 10 reps - Supine Lower Trunk Rotation  - 1 x daily - 7 x weekly - 1 sets - 5-10 reps - 15 sec hold - Supine Scapular Retraction  - 1 x daily - 7 x weekly - 2 sets - 10 reps -  3 sec hold - Supine Shoulder Flexion Extension AAROM with Dowel  - 1 x daily - 7 x weekly - 2 sets - 10 reps - Runner's Step Up/Down  - 1 x daily - 5 x weekly - 2 sets - 10 reps - Standing Toe Taps  - 1 x daily - 5 x weekly - 2 sets - 10 reps  --------------------------------------  Note: Objective measures were completed at Evaluation unless otherwise noted.  DIAGNOSTIC FINDINGS: cute nonhemorrhagic infarct in the anterior left hippocampus via MRI brain  COGNITION: Overall cognitive status: Within functional limits for tasks assessed   SENSATION: Light touch: Impaired  R L L  COORDINATION: Decreased coordination, slowed movement in RUE/RLE  MUSCLE TONE: RLE: Mild   POSTURE: rounded shoulders, forward head, posterior pelvic tilt, and flexed trunk   LOWER EXTREMITY ROM:     Active  Right Eval Left Eval  Hip flexion    Hip extension    Hip abduction    Hip adduction    Hip internal rotation    Hip external rotation    Knee flexion    Knee extension    Ankle dorsiflexion  Ankle plantarflexion    Ankle inversion    Ankle eversion     (Blank rows = not tested)  LOWER  EXTREMITY MMT:    MMT Right Eval Left Eval  Hip flexion 4 5  Hip extension    Hip abduction    Hip adduction    Hip internal rotation    Hip external rotation    Knee flexion 4 5  Knee extension 4 5  Ankle dorsiflexion 4 5  Ankle plantarflexion    Ankle inversion    Ankle eversion    (Blank rows = not tested)   TRANSFERS: Sit to stand: Modified independence  Assistive device utilized: None     Stand to sit: Modified independence  Assistive device utilized: None      GAIT: Findings: Gait Characteristics: step through pattern, decreased arm swing- Right, decreased step length- Right, and decreased step length- Left, Distance walked: 100 ft, Assistive device utilized:None, Level of assistance: Modified independence and SBA, and Comments: overall slowed gait pattern  FUNCTIONAL TESTS:  5 times sit to stand: 22.19 Timed up and go (TUG): 15.04 sec 10 meter walk test: 13.06 sec (2.5 ft/sec) DGI:  12/24 (Scores <19/24/ indicate increased fall risk)                                                                                                                                 TREATMENT DATE: 11/07/2023    PATIENT EDUCATION: Education details: Eval results, POC; initial HEP, posture and positioning and weightbearing for RUE; OT referral Person educated: Patient Education method: Explanation, Demonstration, and Handouts Education comprehension: verbalized understanding, returned demonstration, and needs further education  HOME EXERCISE PROGRAM: Access Code: [email or web address] URL: [email or web address] Date: 11/07/2023 Prepared by: [facility] - Outpatient  Rehab - [facility]  Exercises - Sit to stand in stride stance  - 1 x daily - 7 x weekly - 2 sets - 10 reps - Seated Pelvic Tilt  - 1-2 x daily - 7 x weekly - 1-2 sets - 10 reps  GOALS: Goals reviewed with patient? Yes  SHORT TERM GOALS: = LTGs  LONG TERM GOALS: Target date: 12/13/2023  Pt will be independent  with HEP for improved strength, balance, gait. Baseline: no current formal HEP Goal status: IN PROGRESS  2.  Pt will improve 5x sit<>stand to less than or equal to 11 sec to demonstrate improved functional strength and transfer efficiency.  Baseline: 22 sec Goal status: IN PROGRESS  3.  Pt will improve DGI score to at least 19/24 to decrease fall risk. Baseline: 12/24 Goal status: IN PROGRESS  4.  Pt will improve gait velocity to at least 3 ft/sec for improved gait efficiency and safety.  Baseline: 2.5 ft/sec Goal status: IN PROGRESS  5.  Pt will improve TUG score to less than or equal to 13.5 sec for decreased fall risk. Baseline: 15 sec Goal status: IN PROGRESS  ASSESSMENT:  CLINICAL IMPRESSION: Patient  arrived to session with report of improvement in R shoulder pain. Session focused on progressive balance training incorporating compliant surface training, SLS, and stepping strategy. Patient tolerated these exercises well and demonstrated good ability to eliciting balance reflexes as needed. Most imbalance today was evident with R SLS activities. HEP was updated for increased challenge with exercises that were well-tolerated today. No complaints upon leaving.   OBJECTIVE IMPAIRMENTS: Abnormal gait, decreased balance, decreased mobility, decreased strength, impaired UE functional use, and postural dysfunction.   ACTIVITY LIMITATIONS: carrying, standing, transfers, reach over head, locomotion level, and caring for others  PARTICIPATION LIMITATIONS: meal prep, cleaning, laundry, driving, shopping, community activity, and occupation  PERSONAL FACTORS: 3+ comorbidities: see above are also affecting patient's functional outcome.   REHAB POTENTIAL: Good  CLINICAL DECISION MAKING: Evolving/moderate complexity  EVALUATION COMPLEXITY: Moderate  PLAN:  PT FREQUENCY: 2x/week  PT DURATION: 4 weeks  PLANNED INTERVENTIONS: 97750- Physical Performance Testing, 97110-Therapeutic  exercises, 97530- Therapeutic activity, 97112- Neuromuscular re-education, 97535- Self Care, 02859- Manual therapy, [phone]- Gait training, Patient/Family education, and Balance training  PLAN FOR NEXT SESSION: Progress HEP for RLE strengthening and balance;  RLE strengthening with WB, balance, gait activities     Susan Sanders, [place], DPT 11/28/23 12:35 PM  [facility]patient Rehab at [facility] [address] [place], [place] [address] Phone # [phone] Fax # [phone]

## 2023-11-29 ENCOUNTER — Ambulatory Visit: Admitting: Physical Therapy

## 2023-12-03 ENCOUNTER — Encounter: Payer: Self-pay | Admitting: Physical Therapy

## 2023-12-03 ENCOUNTER — Ambulatory Visit: Admitting: Physical Therapy

## 2023-12-03 DIAGNOSIS — R2681 Unsteadiness on feet: Secondary | ICD-10-CM

## 2023-12-03 DIAGNOSIS — R2689 Other abnormalities of gait and mobility: Secondary | ICD-10-CM | POA: Diagnosis not present

## 2023-12-03 DIAGNOSIS — R293 Abnormal posture: Secondary | ICD-10-CM | POA: Diagnosis not present

## 2023-12-03 DIAGNOSIS — M6281 Muscle weakness (generalized): Secondary | ICD-10-CM | POA: Diagnosis not present

## 2023-12-03 NOTE — Therapy (Signed)
 OUTPATIENT PHYSICAL THERAPY NEURO TREATMENT   Patient Name: Susan Sanders MRN: [id number] DOB:Nov 20, 1970, 53 y.o., female Today's Date: 12/03/2023   PCP: Nicholas Bar, MD REFERRING PROVIDER: Delores Suzann HERO, MD   END OF SESSION:  PT End of Session - 12/03/23 0802     Visit Number 4    Number of Visits 9    Date for PT Re-Evaluation 12/13/23    Authorization Type [place] [employer]    Authorization Time Period 11/07/2023-01/06/2024    Authorization - Visit Number 3    Authorization - Number of Visits 12    PT Start Time 0805    PT Stop Time 0844    PT Time Calculation (min) 39 min    Equipment Utilized During Treatment Gait belt    Activity Tolerance Patient tolerated treatment well    Behavior During Therapy WFL for tasks assessed/performed             Past Medical History:  Diagnosis Date   Allergy    Anemia    Anxiety    Cellulitis    Chest pain    Childhood asthma    Chronic back pain    all over (05/29/2018)   Complication of anesthesia    difficult waking up  (03/30/2019)   Depression    Eczema    Hypertension    Scoliosis    Past Surgical History:  Procedure Laterality Date   ABDOMINAL HYSTERECTOMY     BREAST SURGERY     breast biopsy-right breast   HYSTERECTOMY ABDOMINAL WITH SALPINGO-OOPHORECTOMY Bilateral 07/21/2018   Procedure: TOTAL HYSTERECTOMY ABDOMINAL, BILATERAL SALPINGO-OOPHORECTOMY;  Surgeon: Rockney Evalene SQUIBB, MD;  Location: [facility];  Service: Gynecology;  Laterality: Bilateral;   TUBAL LIGATION     Patient Active Problem List   Diagnosis Date Noted   Hyperlipidemia LDL goal <70 10/11/2023   Stroke (HCC) 10/09/2023   Vomiting 07/12/2023   Grief 03/28/2022   Obesity 02/28/2022   Ganglion cyst 02/28/2022   Trapezius muscle spasm 10/13/2020   Acute right-sided low back pain without sciatica 09/12/2020   Seasonal allergies 08/25/2019   Leiomyoma of body of uterus 07/21/2018   Generalized anxiety disorder  06/03/2018   Overweight 11/02/2016   Essential hypertension 07/14/2015   Cervical spondylolysis 06/18/2014   GERD (gastroesophageal reflux disease) 12/10/2013   Menorrhagia with regular cycle 03/20/2007   Anxiety and depression 01/23/2007    ONSET DATE: 10/11/2023  REFERRING DIAG: I63.9 (ICD-10-CM) - Cerebrovascular accident (CVA), unspecified mechanism (HCC)   THERAPY DIAG:  Unsteadiness on feet  Other abnormalities of gait and mobility  Muscle weakness (generalized)  Rationale for Evaluation and Treatment: Rehabilitation  SUBJECTIVE:  SUBJECTIVE STATEMENT: I'm very tired-I've been in the hospital with my dad having surgery.  He's going to be coming home with me as he recovers.  I feel like I'm about 85-90% back to normal.  My arm is better.   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 She received stroke workup by neurology and was initiated on Plavix  75 mg daily x 3 weeks and baby aspirin  to be followed with monotherapy on aspirin  81 mg daily. Additionally she was started on statin.   PAIN:  Are you having pain? Yes: NPRS scale: 3/10 Pain location: R shoulder Pain description: stiff  Aggravating factors: lying down Relieving factors: propping the hand, heat/ice, positioning with pillows  PRECAUTIONS: Fall, not currently driving  RED FLAGS: None   WEIGHT BEARING RESTRICTIONS: No  FALLS: Has patient fallen in last 6 months? No  LIVING ENVIRONMENT: Lives with: lives with their family Lives in: House/apartment Stairs: 2 steps to enter Has following equipment at home: None  PLOF: Independent, worked as  Lawyer  PATIENT GOALS: For my arm and my leg to get stronger  OBJECTIVE:      TODAY'S TREATMENT: 12/03/23 Activity Comments  Sit<>Stand from mat, 10 reps Sit to stand, stagger stance, RLE posterior x 10 Sit to stand on Airex 2 x 5 reps   Reviewed HEP additions:   Step taps Step ups Good form  Shoulder rolls 2 x 5 Scapular squeezes 2 x 5   Walking forward/backwards Braiding Marching gait Tandem gait 1-2 min each, supervision, no overt LOB  48M walk:  11.63 sec= 2.82 ft/sec Improved from 2.5 ft/sec  FTSTS:  13 sec Improved from 22 sec  TUG:  8.47 sec TUG manual:  9.22 sec TUG cognitive: 13.25 sec TUG improved from 15 sec         HOME EXERCISE PROGRAM: Access Code: [email or web address] URL: [email or web address] Date: 12/03/2023 Prepared by: [facility] - Outpatient  Rehab - [facility]  Program Notes perform standing exercises near counter top for safety  Exercises - Sit to stand in stride stance  - 1 x daily - 7 x weekly - 2 sets - 10 reps - Seated Pelvic Tilt  - 1-2 x daily - 7 x weekly - 1-2 sets - 10 reps - Supine Lower Trunk Rotation  - 1 x daily - 7 x weekly - 1 sets - 5-10 reps - 15 sec hold - Supine Shoulder Flexion Extension AAROM with Dowel  - 1 x daily - 7 x weekly - 2 sets - 10 reps - Runner's Step Up/Down  - 1 x daily - 5 x weekly - 2 sets - 10 reps - Standing Toe Taps  - 1 x daily - 5 x weekly - 2 sets - 10 reps - Seated Scapular Retraction  - 1 x daily - 7 x weekly - 2 sets - 5 reps - [place]ari[place] with Counter Support  - 1 x daily - 7 x weekly - 1 sets - 3-5 reps  PATIENT EDUCATION: Education details: HEP additions, progress towards goals Person educated: Patient Education method: Explanation, Demonstration, Verbal cues, and Handouts Education comprehension: verbalized understanding, returned demonstration, and needs further education       --------------------------------------  Note: Objective measures were completed at Evaluation unless  otherwise noted.  DIAGNOSTIC FINDINGS: cute nonhemorrhagic infarct in the anterior left hippocampus via MRI brain  COGNITION: Overall cognitive status: Within functional limits for tasks assessed   SENSATION: Light touch: Impaired  R L L  COORDINATION: Decreased coordination, slowed movement in RUE/RLE  MUSCLE TONE: RLE: Mild   POSTURE: rounded shoulders, forward head, posterior pelvic tilt, and flexed trunk   LOWER EXTREMITY ROM:     Active  Right Eval Left Eval  Hip flexion    Hip extension    Hip abduction    Hip adduction    Hip internal rotation    Hip external rotation    Knee flexion    Knee extension    Ankle dorsiflexion    Ankle plantarflexion    Ankle inversion    Ankle eversion     (Blank rows = not tested)  LOWER EXTREMITY MMT:    MMT Right Eval Left Eval  Hip flexion 4 5  Hip extension    Hip abduction    Hip adduction    Hip internal rotation    Hip external rotation    Knee flexion 4 5  Knee extension 4 5  Ankle dorsiflexion 4 5  Ankle plantarflexion    Ankle inversion    Ankle eversion    (Blank rows = not tested)   TRANSFERS: Sit to stand: Modified independence  Assistive device utilized: None     Stand to sit: Modified independence  Assistive device utilized: None      GAIT: Findings: Gait Characteristics: step through pattern, decreased arm swing- Right, decreased step length- Right, and decreased step length- Left, Distance walked: 100 ft, Assistive device utilized:None, Level of assistance: Modified independence and SBA, and Comments: overall slowed gait pattern  FUNCTIONAL TESTS:  5 times sit to stand: 22.19 Timed up and go (TUG): 15.04 sec 10 meter walk test: 13.06 sec (2.5 ft/sec) DGI:  12/24 (Scores <19/24/ indicate increased fall risk)                                                                                                                                 TREATMENT DATE: 11/07/2023    PATIENT  EDUCATION: Education details: Eval results, POC; initial HEP, posture and positioning and weightbearing for RUE; OT referral Person educated: Patient Education method: Explanation, Demonstration, and Handouts Education comprehension: verbalized understanding, returned demonstration, and needs further education  HOME EXERCISE PROGRAM: Access Code: [email or web address] URL: [email or web address] Date: 11/07/2023 Prepared by: [facility] - Outpatient  Rehab - [facility]  Exercises - Sit to stand in stride stance  - 1 x daily - 7 x weekly - 2 sets - 10 reps - Seated Pelvic Tilt  - 1-2 x daily - 7 x weekly - 1-2 sets - 10 reps  GOALS: Goals reviewed with patient? Yes  SHORT TERM GOALS: = LTGs  LONG TERM GOALS: Target date: 12/13/2023  Pt will be independent with HEP for improved strength, balance, gait. Baseline: no current formal HEP Goal status: IN PROGRESS  2.  Pt will improve  5x sit<>stand to less than or equal to 11 sec to demonstrate improved functional strength and transfer efficiency.  Baseline: 22 sec Goal status: IN PROGRESS  3.  Pt will improve DGI score to at least 19/24 to decrease fall risk. Baseline: 12/24 Goal status: IN PROGRESS  4.  Pt will improve gait velocity to at least 3 ft/sec for improved gait efficiency and safety.  Baseline: 2.5 ft/sec Goal status: IN PROGRESS  5.  Pt will improve TUG score to less than or equal to 13.5 sec for decreased fall risk. Baseline: 15 sec>8.47 sec 12/03/2023 Goal status: MET, 12/03/2023  ASSESSMENT:  CLINICAL IMPRESSION: Pt presents today with no new complaints; she states she is back to cooking, cleaning tasks.  She reports she feels 85-90% back to normal.  Reviewed some of her HEP and progressed for scapular retraction and braiding exercise.  Looked at some objective measures and she is improving with FTSTS and gait velocity, just not yet to LTG level.  She has improved TUG score and has met LTG 5.  Pt will continue to  benefit from skilled PT towards goals for improved functional mobility, decreased fall risk, and return to independence.   OBJECTIVE IMPAIRMENTS: Abnormal gait, decreased balance, decreased mobility, decreased strength, impaired UE functional use, and postural dysfunction.   ACTIVITY LIMITATIONS: carrying, standing, transfers, reach over head, locomotion level, and caring for others  PARTICIPATION LIMITATIONS: meal prep, cleaning, laundry, driving, shopping, community activity, and occupation  PERSONAL FACTORS: 3+ comorbidities: see above are also affecting patient's functional outcome.   REHAB POTENTIAL: Good  CLINICAL DECISION MAKING: Evolving/moderate complexity  EVALUATION COMPLEXITY: Moderate  PLAN:  PT FREQUENCY: 2x/week  PT DURATION: 4 weeks  PLANNED INTERVENTIONS: 97750- Physical Performance Testing, 97110-Therapeutic exercises, 97530- Therapeutic activity, 97112- Neuromuscular re-education, 97535- Self Care, 02859- Manual therapy, [phone]- Gait training, Patient/Family education, and Balance training  PLAN FOR NEXT SESSION:  Review HEP progressions; begin to look at LTGs-consider early discharge due to progressGLENWOOD Greig Anon, PT 12/03/23 8:48 AM Phone: [phone] Fax: [phone]  [facility] Outpatient Rehab at [facility] [address], [address] [place], [place] [address] Phone # [phone] Fax # [phone]

## 2023-12-05 ENCOUNTER — Ambulatory Visit (INDEPENDENT_AMBULATORY_CARE_PROVIDER_SITE_OTHER): Admitting: Neurology

## 2023-12-05 ENCOUNTER — Encounter: Payer: Self-pay | Admitting: Neurology

## 2023-12-05 VITALS — BP 124/83 | HR 88 | Resp 14 | Ht 65.0 in | Wt 208.5 lb

## 2023-12-05 DIAGNOSIS — I639 Cerebral infarction, unspecified: Secondary | ICD-10-CM

## 2023-12-05 NOTE — Progress Notes (Signed)
 Chief Complaint  Patient presents with   New Patient (Initial Visit)    Rm10, aunt present, NP/internal hospital referral for stroke:pt stated that since cva, she's more tired than usual      ASSESSMENT AND PLAN  Susan Sanders is a 53 y.o. 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   MEDICAL HISTORY:  Susan Sanders is a 53 year old female, seen in request by her primary care doctor Nicholas Bar, for evaluation of stroke, accompanied by her aunt at today's visit December 05, 2023   History is obtained from the patient and review of electronic medical records. I personally reviewed pertinent available imaging films in PACS.   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, on Oct 09, 2023, at work, she had sudden onset dizziness, chest recent falls, already had chronic right shoulder pain, during that day, she also noticed worsening right shoulder pain extending to right arm, her discomfort last about 2 hours  Was taken to the emergency room, MRI of the brain showed acute stroke involving left anterior temporal lobe Echocardiogram normal ejection fraction CT angiogram of head and neck showed no large vessel disease  Laboratory evaluation showed: A1c 5.0, LDL 125  She was discharged with aspirin   81+ Plavix  75 mg for 3 weeks, was not on antiplatelet agent prior to that, not on aspirin  81 mg alone, no focal deficit     PHYSICAL EXAM:   Vitals:   12/05/23 1255  BP: 124/83  Pulse: 88  Resp: 14  SpO2: 97%  Weight: 208 lb 8 oz (94.6 kg)  Height: 5' 5 (1.651 m)     Body mass index is 34.7 kg/m.  PHYSICAL EXAMNIATION:  Gen: NAD, conversant, well nourised, well groomed                     Cardiovascular: Regular rate rhythm, no peripheral edema, warm, nontender. Eyes: Conjunctivae clear without exudates or hemorrhage Neck: Supple, no carotid bruits. Pulmonary: Clear to auscultation bilaterally   NEUROLOGICAL EXAM:  MENTAL STATUS: Speech/cognition: Awake, alert, oriented to history taking and casual conversation CRANIAL NERVES: CN II: Visual fields are full to confrontation. Pupils are round equal and briskly reactive to light. CN III, IV, VI: extraocular movement are normal. No ptosis. CN V: Facial sensation is intact to light touch CN VII: Face is symmetric with normal eye closure  CN VIII: Hearing is normal to causal conversation. CN IX, X: Phonation is normal. CN XI: Head turning and shoulder shrug are intact  MOTOR: There is no pronator drift of out-stretched arms. Muscle bulk and tone are normal. Muscle strength is normal.  REFLEXES: Reflexes are 2+ and symmetric at the biceps, triceps, knees, and ankles. Plantar responses are flexor.  SENSORY: Intact to light touch, pinprick and vibratory sensation are intact in fingers and toes.  COORDINATION: There is no trunk or limb dysmetria noted.  GAIT/STANCE: Posture is normal. Gait is steady    REVIEW OF SYSTEMS:  Full 14 system review of systems performed and notable only for as above All other review of systems were negative.   ALLERGIES: Allergies  Allergen Reactions   Amoxicillin Shortness Of Breath, Itching and Swelling    Has patient had a PCN reaction causing immediate rash, facial/tongue/throat  swelling, SOB or lightheadedness with hypotension: Yes Has patient had a PCN reaction causing severe rash involving mucus membranes or skin necrosis: Yes Has patient had a PCN reaction that required hospitalization: Yes Has patient had a PCN reaction occurring within the last 10 years: No If all of the above answers are NO, then may proceed with Cephalosporin use.    Latex Shortness Of Breath    Itching and burns skin   Naproxen  Hives and Itching    Takes IBU without difficulty   Tramadol  Hives, Itching and Other (See Comments)    Dysphoria - visual disturbance, hot sensatin    HOME MEDICATIONS: Current Outpatient Medications  Medication Sig Dispense Refill   amLODipine  (NORVASC ) 10 MG tablet Take 10 mg by mouth daily.     aspirin  EC 81 MG tablet Take 1 tablet (81 mg total) by mouth daily. Swallow whole. 90 tablet 0   atorvastatin  (LIPITOR) 80 MG tablet Take 1 tablet (80 mg total) by mouth daily. 90 tablet 0   losartan -hydrochlorothiazide  (HYZAAR) 50-12.5 MG tablet Take 1 tablet by mouth daily. 30 tablet 0   No current facility-administered medications for this visit.    PAST MEDICAL HISTORY: Past Medical History:  Diagnosis Date   Allergy    Anemia    Anxiety    Cellulitis    Chest pain    Childhood asthma    Chronic back pain    all over (05/29/2018)   Complication of anesthesia    difficult waking up  (03/30/2019)   Depression    Eczema    Hypertension    Scoliosis     PAST SURGICAL HISTORY: Past Surgical History:  Procedure Laterality Date   ABDOMINAL HYSTERECTOMY     BREAST SURGERY     breast biopsy-right breast   HYSTERECTOMY ABDOMINAL WITH SALPINGO-OOPHORECTOMY Bilateral 07/21/2018   Procedure: TOTAL HYSTERECTOMY ABDOMINAL, BILATERAL SALPINGO-OOPHORECTOMY;  Surgeon: Rockney Evalene SQUIBB, MD;  Location: [facility];  Service: Gynecology;  Laterality: Bilateral;   TUBAL LIGATION      FAMILY HISTORY: Family History  Problem Relation Age of  Onset   Hypertension Mother    Bladder Cancer Mother    Breast cancer Maternal Aunt 31   Colon cancer Neg Hx    Esophageal cancer Neg Hx    Stomach cancer Neg Hx    Rectal cancer Neg Hx     SOCIAL HISTORY: Social History   Socioeconomic History   Marital status: Divorced    Spouse name: Not on file   Number of children: Not on file   Years of education: Not on file   Highest education level: Not on file  Occupational History   Not on file  Tobacco Use   Smoking status: Former    Current packs/day: 0.00    Average packs/day: 0.5 packs/day for 28.0 years (14.0 ttl pk-yrs)    Types: Cigarettes    Start date: 09/20/1982    Quit date: 09/20/2010    Years since quitting: 13.2    Passive exposure: Past   Smokeless tobacco: Never  Vaping  Use   Vaping status: Never Used  Substance and Sexual Activity   Alcohol use: Yes    Alcohol/week: 2.0 standard drinks of alcohol    Types: 2 Cans of beer per week   Drug use: Not Currently    Types: Marijuana   Sexual activity: Not Currently    Birth control/protection: Surgical    Comment: 1st intercourse 79 yo-5 partners  Other Topics Concern   Not on file  Social History Narrative   Not on file   Social Drivers of Health   Financial Resource Strain: Not on file  Food Insecurity: No Food Insecurity (10/10/2023)   Hunger Vital Sign    Worried About Running Out of Food in the Last Year: Never true    Ran Out of Food in the Last Year: Never true  Transportation Needs: No Transportation Needs (10/10/2023)   PRAPARE - Administrator, Civil Service (Medical): No    Lack of Transportation (Non-Medical): No  Physical Activity: Not on file  Stress: Not on file  Social Connections: Not on file  Intimate Partner Violence: Not At Risk (10/10/2023)   Humiliation, Afraid, Rape, and Kick questionnaire    Fear of Current or Ex-Partner: No    Emotionally Abused: No    Physically Abused: No    Sexually Abused: No      Modena Callander,  M.D. Ph.D.  [facility] [address] [place], [place] [address] Ph: [phone] Fax: [phone]  CC:  Arlice Reichert, MD [address] [place],  [place] [address]  Nicholas Bar, MD

## 2023-12-06 ENCOUNTER — Ambulatory Visit: Admitting: Physical Therapy

## 2023-12-06 ENCOUNTER — Encounter: Payer: Self-pay | Admitting: Physical Therapy

## 2023-12-06 DIAGNOSIS — M6281 Muscle weakness (generalized): Secondary | ICD-10-CM

## 2023-12-06 DIAGNOSIS — R2681 Unsteadiness on feet: Secondary | ICD-10-CM

## 2023-12-06 DIAGNOSIS — R2689 Other abnormalities of gait and mobility: Secondary | ICD-10-CM | POA: Diagnosis not present

## 2023-12-06 DIAGNOSIS — R293 Abnormal posture: Secondary | ICD-10-CM | POA: Diagnosis not present

## 2023-12-06 NOTE — Therapy (Signed)
 OUTPATIENT PHYSICAL THERAPY NEURO TREATMENT   Patient Name: Susan Sanders MRN: [id number] DOB:30-Nov-1970, 53 y.o., female Today's Date: 12/06/2023   PCP: Nicholas Bar, MD REFERRING PROVIDER: Delores Suzann HERO, MD   END OF SESSION:  PT End of Session - 12/06/23 0807     Visit Number 5    Number of Visits 9    Date for PT Re-Evaluation 12/13/23    Authorization Type [place] Medicaid-[employer]    Authorization Time Period 11/07/2023-01/06/2024    Authorization - Visit Number 4    Authorization - Number of Visits 12    PT Start Time 0806    PT Stop Time 0832    PT Time Calculation (min) 26 min    Equipment Utilized During Treatment Gait belt    Activity Tolerance Patient tolerated treatment well    Behavior During Therapy [facility] for tasks assessed/performed              Past Medical History:  Diagnosis Date   Allergy    Anemia    Anxiety    Cellulitis    Chest pain    Childhood asthma    Chronic back pain    all over (05/29/2018)   Complication of anesthesia    difficult waking up  (03/30/2019)   Depression    Eczema    Hypertension    Scoliosis    Past Surgical History:  Procedure Laterality Date   ABDOMINAL HYSTERECTOMY     BREAST SURGERY     breast biopsy-right breast   HYSTERECTOMY ABDOMINAL WITH SALPINGO-OOPHORECTOMY Bilateral 07/21/2018   Procedure: TOTAL HYSTERECTOMY ABDOMINAL, BILATERAL SALPINGO-OOPHORECTOMY;  Surgeon: Rockney Evalene SQUIBB, MD;  Location: [facility];  Service: Gynecology;  Laterality: Bilateral;   TUBAL LIGATION     Patient Active Problem List   Diagnosis Date Noted   Hyperlipidemia LDL goal <70 10/11/2023   Stroke (HCC) 10/09/2023   Vomiting 07/12/2023   Grief 03/28/2022   Obesity 02/28/2022   Ganglion cyst 02/28/2022   Trapezius muscle spasm 10/13/2020   Acute right-sided low back pain without sciatica 09/12/2020   Seasonal allergies 08/25/2019   Leiomyoma of body of uterus 07/21/2018   Generalized anxiety  disorder 06/03/2018   Overweight 11/02/2016   Essential hypertension 07/14/2015   Cervical spondylolysis 06/18/2014   GERD (gastroesophageal reflux disease) 12/10/2013   Menorrhagia with regular cycle 03/20/2007   Anxiety and depression 01/23/2007    ONSET DATE: 10/11/2023  REFERRING DIAG: I63.9 (ICD-10-CM) - Cerebrovascular accident (CVA), unspecified mechanism (HCC)   THERAPY DIAG:  Unsteadiness on feet  Other abnormalities of gait and mobility  Muscle weakness (generalized)  Rationale for Evaluation and Treatment: Rehabilitation  SUBJECTIVE:  SUBJECTIVE STATEMENT: Went to neurologist yesterday and she squeezed my arm-made it hurt, but it's better.   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 She received stroke workup by neurology and was initiated on Plavix  75 mg daily x 3 weeks and baby aspirin  to be followed with monotherapy on aspirin  81 mg daily. Additionally she was started on statin.   PAIN:  Are you having pain? Yes: NPRS scale: 2/10 Pain location: R shoulder Pain description: stiff  Aggravating factors: lying down Relieving factors: propping the hand, heat/ice, positioning with pillows  PRECAUTIONS: Fall, not currently driving  RED FLAGS: None   WEIGHT BEARING RESTRICTIONS: No  FALLS: Has patient fallen in last 6 months? No  LIVING ENVIRONMENT: Lives with: lives with their family Lives in: House/apartment Stairs: 2 steps to enter Has following equipment at home: None  PLOF: Independent, worked as Lawyer  PATIENT GOALS: For my arm and my leg to get stronger  OBJECTIVE:    TODAY'S TREATMENT:  12/06/2023 Activity Comments  Review of HEP additions Shoulder blade squeeze Braiding Good return demo  Forward/back walking Tandem gait Independent, no overt LOB  FTSTS 10.28 sec   10 M:  9.1 sec = 3.6 ft/sec   DGI:  21/24 FGA 25/30           HOME EXERCISE PROGRAM: Access Code: [email or web address] URL: [email or web address] Date: 12/03/2023 Prepared by: [facility] - Outpatient  Rehab - [facility]  Program Notes perform standing exercises near counter top for safety  Exercises - Sit to stand in stride stance  - 1 x daily - 7 x weekly - 2 sets - 10 reps - Seated Pelvic Tilt  - 1-2 x daily - 7 x weekly - 1-2 sets - 10 reps - Supine Lower Trunk Rotation  - 1 x daily - 7 x weekly - 1 sets - 5-10 reps - 15 sec hold - Supine Shoulder Flexion Extension AAROM with Dowel  - 1 x daily - 7 x weekly - 2 sets - 10 reps - Runner's Step Up/Down  - 1 x daily - 5 x weekly - 2 sets - 10 reps - Standing Toe Taps  - 1 x daily - 5 x weekly - 2 sets - 10 reps - Seated Scapular Retraction  - 1 x daily - 7 x weekly - 2 sets - 5 reps - Carioca with Counter Support  - 1 x daily - 7 x weekly - 1 sets - 3-5 reps  PATIENT EDUCATION: Education details: Progress towards goals, POC and plans for discharge today; discussed CVA warning signs/symptoms, risk factors Person educated: Patient Education method: Explanation and Verbal cues Education comprehension: verbalized understanding       --------------------------------------  Note: Objective measures were completed at Evaluation unless otherwise noted.  DIAGNOSTIC FINDINGS: cute nonhemorrhagic infarct in the anterior left hippocampus via MRI brain  COGNITION: Overall cognitive status: Within functional limits for tasks assessed   SENSATION: Light touch: Impaired  R L L  COORDINATION: Decreased coordination, slowed movement in RUE/RLE  MUSCLE TONE: RLE: Mild   POSTURE: rounded shoulders, forward head, posterior pelvic tilt, and  flexed trunk   LOWER EXTREMITY ROM:     Active  Right Eval Left Eval  Hip flexion    Hip extension    Hip abduction    Hip adduction    Hip internal  rotation    Hip external rotation    Knee flexion    Knee extension    Ankle dorsiflexion    Ankle plantarflexion    Ankle inversion    Ankle eversion     (Blank rows = not tested)  LOWER EXTREMITY MMT:    MMT Right Eval Left Eval  Hip flexion 4 5  Hip extension    Hip abduction    Hip adduction    Hip internal rotation    Hip external rotation    Knee flexion 4 5  Knee extension 4 5  Ankle dorsiflexion 4 5  Ankle plantarflexion    Ankle inversion    Ankle eversion    (Blank rows = not tested)   TRANSFERS: Sit to stand: Modified independence  Assistive device utilized: None     Stand to sit: Modified independence  Assistive device utilized: None      GAIT: Findings: Gait Characteristics: step through pattern, decreased arm swing- Right, decreased step length- Right, and decreased step length- Left, Distance walked: 100 ft, Assistive device utilized:None, Level of assistance: Modified independence and SBA, and Comments: overall slowed gait pattern  FUNCTIONAL TESTS:  5 times sit to stand: 22.19 Timed up and go (TUG): 15.04 sec 10 meter walk test: 13.06 sec (2.5 ft/sec) DGI:  12/24 (Scores <19/24/ indicate increased fall risk)  [facility] PT Assessment - 12/06/23 0819       Standardized Balance Assessment   Standardized Balance Assessment Dynamic Gait Index      Dynamic Gait Index   Level Surface Mild Impairment   5.72   Change in Gait Speed Normal    Gait with Horizontal Head Turns Mild Impairment    Gait with Vertical Head Turns Mild Impairment    Gait and Pivot Turn Normal    Step Over Obstacle Normal    Step Around Obstacles Normal    Steps Normal    Total Score 21      Functional Gait  Assessment   Gait assessed  Yes    Gait Level Surface Walks 20 ft in less than 7 sec but greater than 5.5 sec, uses  assistive device, slower speed, mild gait deviations, or deviates 6-10 in outside of the 12 in walkway width.    Change in Gait Speed Able to smoothly change walking speed without loss of balance or gait deviation. Deviate no more than 6 in outside of the 12 in walkway width.    Gait with Horizontal Head Turns Performs head turns smoothly with slight change in gait velocity (eg, minor disruption to smooth gait path), deviates 6-10 in outside 12 in walkway width, or uses an assistive device.    Gait with Vertical Head Turns Performs task with slight change in gait velocity (eg, minor disruption to smooth gait path), deviates 6 - 10 in outside 12 in walkway width or uses assistive device    Gait and Pivot Turn Pivot turns safely within 3 sec and stops quickly with no loss of balance.    Step Over Obstacle Is able to step over 2 stacked shoe boxes taped together (9 in total height) without changing gait speed. No evidence of imbalance.    Gait with Narrow Base of Support Is able to ambulate for 10 steps heel to toe with no staggering.    Gait with Eyes Closed Walks 20 ft, slow speed, abnormal gait pattern, evidence for imbalance, deviates 10-15 in outside 12 in walkway width. Requires more than 9 sec to ambulate  20 ft.    Ambulating Backwards Walks 20 ft, no assistive devices, good speed, no evidence for imbalance, normal gait    Steps Alternating feet, no rail.    Total Score 25                                                                                                                                        TREATMENT DATE: 11/07/2023    PATIENT EDUCATION: Education details: Eval results, POC; initial HEP, posture and positioning and weightbearing for RUE; OT referral Person educated: Patient Education method: Explanation, Demonstration, and Handouts Education comprehension: verbalized understanding, returned demonstration, and needs further education  HOME EXERCISE PROGRAM: Access Code:  [email or web address] URL: [email or web address] Date: 11/07/2023 Prepared by: [facility] - Outpatient  Rehab - [facility]  Exercises - Sit to stand in stride stance  - 1 x daily - 7 x weekly - 2 sets - 10 reps - Seated Pelvic Tilt  - 1-2 x daily - 7 x weekly - 1-2 sets - 10 reps  GOALS: Goals reviewed with patient? Yes  SHORT TERM GOALS: = LTGs  LONG TERM GOALS: Target date: 12/13/2023  Pt will be independent with HEP for improved strength, balance, gait. Baseline: no current formal HEP Goal status: MET  2.  Pt will improve 5x sit<>stand to less than or equal to 11 sec to demonstrate improved functional strength and transfer efficiency.  Baseline: 22 sec>10.28 sec Goal status: MET, 12/06/2023  3.  Pt will improve DGI score to at least 19/24 to decrease fall risk. Baseline: 12/24>21/24 Goal status: MET, 12/06/2023  4.  Pt will improve gait velocity to at least 3 ft/sec for improved gait efficiency and safety.  Baseline: 2.5 ft/sec Goal status: MET, 12/06/2023  5.  Pt will improve TUG score to less than or equal to 13.5 sec for decreased fall risk. Baseline: 15 sec>8.47 sec 12/03/2023 Goal status: MET, 12/03/2023  ASSESSMENT:  CLINICAL IMPRESSION: Pt presents today and continues to be moving well.  Skilled PT session focused on assessing LTGs, with pt meeting 5 of 5 LTGs.  She has improved all functional mobility measures, and is no longer at increased fall risk.  She is appropriate for discharge from PT at this time.    OBJECTIVE IMPAIRMENTS: Abnormal gait, decreased balance, decreased mobility, decreased strength, impaired UE functional use, and postural dysfunction.   ACTIVITY LIMITATIONS: carrying, standing, transfers, reach over head, locomotion level, and caring for others  PARTICIPATION LIMITATIONS: meal prep, cleaning, laundry, driving, shopping, community activity, and occupation  PERSONAL FACTORS: 3+ comorbidities: see above are also affecting patient's  functional outcome.   REHAB POTENTIAL: Good  CLINICAL DECISION MAKING: Evolving/moderate complexity  EVALUATION COMPLEXITY: Moderate  PLAN:  PT FREQUENCY: 2x/week  PT DURATION: 4 weeks  PLANNED INTERVENTIONS: 97750- Physical Performance Testing, 97110-Therapeutic exercises, 97530- Therapeutic activity, [id number]- Neuromuscular re-education, 97535- Self Care, [id number]- Manual therapy, [id number]- Gait training, Patient/Family  education, and Balance training  PLAN FOR NEXT SESSION:  Discharge PT at this time.   Greig Anon, PT 12/06/23 8:36 AM Phone: [phone] Fax: [phone]  [facility] at [facility] [address] [place], [address] [place], [place] [address] Phone # [phone] Fax # [phone]

## 2023-12-09 ENCOUNTER — Other Ambulatory Visit: Payer: Self-pay

## 2023-12-09 ENCOUNTER — Ambulatory Visit: Admitting: Occupational Therapy

## 2023-12-09 DIAGNOSIS — R2681 Unsteadiness on feet: Secondary | ICD-10-CM | POA: Diagnosis not present

## 2023-12-09 DIAGNOSIS — R2689 Other abnormalities of gait and mobility: Secondary | ICD-10-CM | POA: Diagnosis not present

## 2023-12-09 DIAGNOSIS — M6281 Muscle weakness (generalized): Secondary | ICD-10-CM

## 2023-12-09 DIAGNOSIS — R293 Abnormal posture: Secondary | ICD-10-CM | POA: Diagnosis not present

## 2023-12-09 NOTE — Therapy (Signed)
 OUTPATIENT OCCUPATIONAL THERAPY NEURO EVALUATION  Patient Name: Susan Sanders MRN: [id number] DOB:1970-12-23, 53 y.o., female Today's Date: 12/09/2023  PCP: Nicholas Bar, MD REFERRING PROVIDER: Delores Suzann HERO, MD  END OF SESSION:  OT End of Session - 12/09/23 1133     Visit Number 1    Number of Visits 1    Authorization Type [place] [employer] ([employer]) 2025    OT Start Time 1105    OT Stop Time 1127    OT Time Calculation (min) 22 min    Behavior During Therapy WFL for tasks assessed/performed          Past Medical History:  Diagnosis Date   Allergy    Anemia    Anxiety    Cellulitis    Chest pain    Childhood asthma    Chronic back pain    all over (05/29/2018)   Complication of anesthesia    difficult waking up  (03/30/2019)   Depression    Eczema    Hypertension    Scoliosis    Past Surgical History:  Procedure Laterality Date   ABDOMINAL HYSTERECTOMY     BREAST SURGERY     breast biopsy-right breast   HYSTERECTOMY ABDOMINAL WITH SALPINGO-OOPHORECTOMY Bilateral 07/21/2018   Procedure: TOTAL HYSTERECTOMY ABDOMINAL, BILATERAL SALPINGO-OOPHORECTOMY;  Surgeon: Rockney Evalene SQUIBB, MD;  Location: [facility];  Service: Gynecology;  Laterality: Bilateral;   TUBAL LIGATION     Patient Active Problem List   Diagnosis Date Noted   Hyperlipidemia LDL goal <70 10/11/2023   Stroke (HCC) 10/09/2023   Vomiting 07/12/2023   Grief 03/28/2022   Obesity 02/28/2022   Ganglion cyst 02/28/2022   Trapezius muscle spasm 10/13/2020   Acute right-sided low back pain without sciatica 09/12/2020   Seasonal allergies 08/25/2019   Leiomyoma of body of uterus 07/21/2018   Generalized anxiety disorder 06/03/2018   Overweight 11/02/2016   Essential hypertension 07/14/2015   Cervical spondylolysis 06/18/2014   GERD (gastroesophageal reflux disease) 12/10/2013   Menorrhagia with regular cycle 03/20/2007   Anxiety and depression 01/23/2007    ONSET DATE:  10/09/23  REFERRING DIAG: I63.9 (ICD-10-CM) - Cerebrovascular accident (CVA), unspecified mechanism (HCC)  THERAPY DIAG:  Muscle weakness (generalized)  Rationale for Evaluation and Treatment: Rehabilitation  SUBJECTIVE:   SUBJECTIVE STATEMENT: Pt reports hospitalized in May 2025.  Pt reports that she is back to normal but that she is intentional to take her time as she used to move quite fast.  Per PT evaluation 11/07/23 - pt reporting heaviness and slowing of dominant RUE - reports this has resolved. 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 She received stroke workup by neurology and was initiated on Plavix  75 mg daily x 3 weeks and baby aspirin  to be followed with monotherapy on aspirin  81 mg daily. Additionally she was started on statin.   PRECAUTIONS: None  WEIGHT BEARING RESTRICTIONS: No  PAIN:  Are you having pain? No  FALLS: Has patient fallen in last 6 months? No  LIVING ENVIRONMENT: Lives with: lives with their daughter Lives in: House/apartment Stairs: 2 steps to enter Has following equipment at home: None  PLOF: Independent and Independent with basic ADLs, worked as Lawyer  PATIENT GOALS: to be independent  OBJECTIVE:  Note: Objective measures were completed at Evaluation unless otherwise noted.  HAND DOMINANCE: Right  ADLs: Overall  ADLs: Reports Mod I to Independent with all ADLs Transfers/ambulation related to ADLs: no AD Bathing: Completed in standing without any issue Equipment: Walk in shower  IADLs:  Reports Mod I to Independent with all ADLs Community mobility: drove  MOBILITY STATUS: Independent  POSTURE COMMENTS:  rounded shoulders and forward head  ACTIVITY TOLERANCE: Activity tolerance: no complaints  UPPER EXTREMITY ROM:  WFL bilaterally  UPPER EXTREMITY MMT:    grossly 4+ to 5 overall  HAND FUNCTION: Grip strength: Right: 58 lbs; Left: 45 lbs, Lateral pinch: Right: 15 lbs, Left: 15 lbs, and 3 point pinch: Right: 16 lbs, Left: 14 lbs  COORDINATION: 9 Hole Peg test: Right: 19.25 sec; Left: 20.41 sec Box and Blocks:  Right 64 blocks, Left 62 blocks  SENSATION: WFL  COGNITION: Overall cognitive status: Within functional limits for tasks assessed  VISION: Subjective report: My vision was bad before Baseline vision: Wears glasses for distance only, when driving                                                                                                                             TREATMENT DATE:  12/09/23 Educated on stroke signs - BEFAST.  Provided with handout.     PATIENT EDUCATION: Education details: educated on s/s of stroke - BEFAST Person educated: Patient Education method: Chief Technology Officer Education comprehension: verbalized understanding  HOME EXERCISE PROGRAM: N/A   GOALS: Goals reviewed with patient? Yes  SHORT TERM GOALS: Target date: 12/09/23  Pt will report understanding of stroke signs and symptoms. Baseline: educated on BEFAST Goal status: MET   ASSESSMENT:  CLINICAL IMPRESSION: Patient is a 53 y.o. female who was seen today for occupational therapy evaluation for dominant RUE weakness s/p CVA.  Pt reports issues with heaviness and weakness s/p stroke which have all resolved as this time.  OT assessed ROM, strength, and coordination with measurements WNL. Pt with no need for skilled occupational therapy services at this time.  PERFORMANCE DEFICITS: in functional skills including strength and awareness of stroke s/s.  MODIFICATION OR ASSISTANCE TO COMPLETE EVALUATION: No modification of tasks or assist necessary to complete an evaluation.  OT OCCUPATIONAL PROFILE AND HISTORY: Problem focused assessment: Including review of records relating to presenting problem.  CLINICAL DECISION MAKING: LOW -  limited treatment options, no task modification necessary  REHAB POTENTIAL: Excellent  EVALUATION COMPLEXITY: Low    PLAN:  OT FREQUENCY: one time visit; eval only  OT DURATION: other: eval only  PLANNED INTERVENTIONS: 97535 self care/ADL training and patient/family education  RECOMMENDED OTHER SERVICES: NA  CONSULTED AND AGREED WITH PLAN OF CARE: Patient  PLAN FOR NEXT SESSION: NA, eval only  For all possible CPT codes, reference the Planned Interventions line above.     Check all conditions that are expected to impact treatment: {Conditions expected to impact treatment:None of these apply   If treatment provided at initial evaluation, no treatment charged due to lack of authorization.  KAYLENE DOMINO, OTR/L 12/09/2023, 11:34 AM   [facility] at [facility] [address] [place], [address] [place], [place] [address] Phone # [phone] Fax # [phone]

## 2023-12-10 ENCOUNTER — Encounter: Payer: Self-pay | Admitting: Family Medicine

## 2023-12-10 ENCOUNTER — Ambulatory Visit (INDEPENDENT_AMBULATORY_CARE_PROVIDER_SITE_OTHER): Payer: Self-pay | Admitting: Family Medicine

## 2023-12-10 ENCOUNTER — Ambulatory Visit: Admitting: Physical Therapy

## 2023-12-10 VITALS — BP 130/62 | HR 74 | Ht 65.0 in | Wt 209.6 lb

## 2023-12-10 DIAGNOSIS — I639 Cerebral infarction, unspecified: Secondary | ICD-10-CM | POA: Diagnosis not present

## 2023-12-10 DIAGNOSIS — E785 Hyperlipidemia, unspecified: Secondary | ICD-10-CM | POA: Diagnosis not present

## 2023-12-10 DIAGNOSIS — I10 Essential (primary) hypertension: Secondary | ICD-10-CM

## 2023-12-10 NOTE — Patient Instructions (Signed)
 It was wonderful to see you today.  Please bring ALL of your medications with you to every visit.   Today we talked about:  Stroke - I have filled out the paperwork for you; however, you will have to put in your policy information. I am going to check your cholesterol. If it is still high despite you taking atorvastatin  we may need to add another medication to help prevent stroke in the future.   Please follow up in 3 months   Thank you for choosing [facility].   Please call [phone] with any questions about today's appointment.  Please be sure to schedule follow up at the front desk before you leave today.   Areta Saliva, MD  Family Medicine

## 2023-12-10 NOTE — Progress Notes (Signed)
    SUBJECTIVE:   CHIEF COMPLAINT / HPI:   Stroke follow-up Patient states that she is doing very well.  She is graduated from physical therapy and was seen by Occupational Therapy and discharged from them as well.  She says that she has been walking daily and doing other exercises.  She has no concerns at this time in terms of neurologic deficits.  She was seen by neurology and will have 1 follow-up and discharge from their clinic as well if there are no further concerns. She feels that she is ready to go back to work.  Has talked to her employer and discussed any accommodations that would be necessary in terms of doing tasks slightly slower. She is concerned that she might not be able to pay rent for her home due to her having to be on leave for so long from her stroke.  She has brought disability paperwork with her today.  PERTINENT  PMH / PSH: History of CVA, hyperlipidemia, HTN  OBJECTIVE:   BP 130/62   Pulse 74   Ht 5' 5 (1.651 m)   Wt 209 lb 9.6 oz (95.1 kg)   LMP 06/24/2018   SpO2 99%   BMI 34.88 kg/m   General: well appearing, in no acute distress CV: Well-perfused, no BLE edema  Resp: Normal work of breathing on room air Abd: Soft, non tender, non distended  Neuro: Alert & Oriented x 4, no gross upper or lower extremity deficits, intact coordination, no truncal ataxia, normal sensation throughout, normal gait   ASSESSMENT/PLAN:   Assessment & Plan Cerebrovascular accident (CVA), unspecified mechanism (HCC) Patient has greatly improved with almost no neurologic deficits at this point. - Continue ASA 81 mg daily - Lipid panel today, with LDL goal less than 70 - Filled paperwork for short-term disability for patient given recent stroke and treatment - Wrote letter for okay to return to work with occasional breaks as needed Hyperlipidemia LDL goal <70 Patient with LDL of 125 as of 2 months ago.  Has goal of less than 70 given stroke. - Follow-up lipid panel. -  Continue atorvastatin  80 mg, if LDL is still elevated above goal consider adding ezetimibe  Essential hypertension Blood pressure well-controlled given goal with history of stroke. - Continue Hyzaar 50-12.5 and amlodipine  10 mg   Follow-up in 3 months for hypertension follow-up as well as Pap smear  Areta Saliva, MD [facility]

## 2023-12-10 NOTE — Assessment & Plan Note (Signed)
 Blood pressure well-controlled given goal with history of stroke. - Continue Hyzaar 50-12.5 and amlodipine  10 mg

## 2023-12-10 NOTE — Assessment & Plan Note (Signed)
 Patient with LDL of 125 as of 2 months ago.  Has goal of less than 70 given stroke. - Follow-up lipid panel. - Continue atorvastatin  80 mg, if LDL is still elevated above goal consider adding ezetimibe 

## 2023-12-10 NOTE — Assessment & Plan Note (Signed)
 Patient has greatly improved with almost no neurologic deficits at this point. - Continue ASA 81 mg daily - Lipid panel today, with LDL goal less than 70 - Filled paperwork for short-term disability for patient given recent stroke and treatment - Wrote letter for okay to return to work with occasional breaks as needed

## 2023-12-11 LAB — LIPID PANEL
Chol/HDL Ratio: 2.5 ratio (ref 0.0–4.4)
Cholesterol, Total: 161 mg/dL (ref 100–199)
HDL: 65 mg/dL (ref >39)
LDL Chol Calc (NIH): 82 mg/dL (ref 0–99)
Triglycerides: 76 mg/dL (ref 0–149)
VLDL Cholesterol Cal: 14 mg/dL (ref 5–40)

## 2023-12-12 ENCOUNTER — Telehealth: Payer: Self-pay | Admitting: Family Medicine

## 2023-12-12 ENCOUNTER — Ambulatory Visit: Payer: Self-pay | Admitting: Family Medicine

## 2023-12-12 DIAGNOSIS — E785 Hyperlipidemia, unspecified: Secondary | ICD-10-CM

## 2023-12-12 MED ORDER — EZETIMIBE 10 MG PO TABS: 10.0000 mg | ORAL_TABLET | Freq: Every day | ORAL | 0 refills | Status: DC

## 2023-12-12 NOTE — Telephone Encounter (Signed)
 Called Susan Sanders after she called the office back. Discussed cholesterol labs and discussed ezetimibe . Recommended we recheck her cholesterol in a month thereafter. She agreed and will pick up medication tomorrow.

## 2023-12-12 NOTE — Telephone Encounter (Signed)
 Attempted to call patient regarding lab results. LDL Is normal; however, patient has goal of < 70 give history of stroke. Would benefit from adding another agent such as ezetimibe . Will send mychart message.

## 2023-12-13 ENCOUNTER — Ambulatory Visit: Admitting: Physical Therapy

## 2023-12-17 LAB — DRUG SCREEN 10 W/CONF, SERUM
Amphetamines, IA: NEGATIVE ng/mL
Barbiturates, IA: NEGATIVE ug/mL
Benzodiazepines, IA: NEGATIVE ng/mL
Cocaine & Metabolite, IA: NEGATIVE ng/mL
Methadone, IA: NEGATIVE ng/mL
Opiates, IA: NEGATIVE ng/mL
Oxycodones, IA: NEGATIVE ng/mL
Phencyclidine, IA: NEGATIVE ng/mL
Propoxyphene, IA: NEGATIVE ng/mL
THC(Marijuana) Metabolite, IA: POSITIVE ng/mL — AB

## 2023-12-17 LAB — THC,MS,WB/SP RFX
Cannabidiol: NEGATIVE
Cannabinoid Confirmation: POSITIVE
Carboxy-THC: 55.2 ng/mL
Hydroxy-THC: 4.8 ng/mL
Tetrahydrocannabinol(THC): 4.5 ng/mL

## 2023-12-19 ENCOUNTER — Ambulatory Visit: Admitting: Occupational Therapy

## 2023-12-24 DIAGNOSIS — Z419 Encounter for procedure for purposes other than remedying health state, unspecified: Secondary | ICD-10-CM | POA: Diagnosis not present

## 2023-12-26 LAB — HYPERCOAGULABLE PANEL, COMPREHENSIVE
APTT: 26.3 s
AT III Act/Nor PPP Chro: 116 %
Act. Prt C Resist w/FV Defic.: 2.6 ratio
Anticardiolipin Ab, IgG: <10 [GPL'U]
Anticardiolipin Ab, IgM: <10 [MPL'U]
Beta-2 Glycoprotein I, IgA: <10 SAU
Beta-2 Glycoprotein I, IgG: <10 SGU
Beta-2 Glycoprotein I, IgM: <10 SMU
DRVVT Screen Seconds: 36.4 s
Factor VII Antigen**: 105 %
Factor VIII Activity: 96 %
Hexagonal Phospholipid Neutral: 4 s
Homocysteine: 11 umol/L
Prot C Ag Act/Nor PPP Imm: 100 %
Prot S Ag Act/Nor PPP Imm: 104 %
Protein C Ag/FVII Ag Ratio**: 1 ratio
Protein S Ag/FVII Ag Ratio**: 1 ratio

## 2023-12-26 LAB — TSH: TSH: 0.617 u[IU]/mL (ref 0.450–4.500)

## 2023-12-26 LAB — VITAMIN B12: Vitamin B-12: 553 pg/mL (ref 232–1245)

## 2023-12-26 LAB — SEDIMENTATION RATE: Sed Rate: 19 mm/h (ref 0–40)

## 2023-12-26 LAB — RPR: RPR Ser Ql: NONREACTIVE

## 2023-12-26 LAB — C-REACTIVE PROTEIN: CRP: 4 mg/L (ref 0–10)

## 2023-12-26 LAB — HIV ANTIBODY (ROUTINE TESTING W REFLEX): HIV Screen 4th Generation wRfx: NONREACTIVE

## 2023-12-26 LAB — FOLATE: Folate: 5.5 ng/mL (ref >3.0)

## 2023-12-31 ENCOUNTER — Ambulatory Visit: Payer: Self-pay | Admitting: Neurology

## 2024-01-07 ENCOUNTER — Other Ambulatory Visit: Payer: Self-pay

## 2024-01-07 DIAGNOSIS — I10 Essential (primary) hypertension: Secondary | ICD-10-CM

## 2024-01-07 DIAGNOSIS — E785 Hyperlipidemia, unspecified: Secondary | ICD-10-CM

## 2024-01-07 MED ORDER — LOSARTAN POTASSIUM-HCTZ 50-12.5 MG PO TABS
1.0000 | ORAL_TABLET | Freq: Every day | ORAL | 0 refills | Status: DC
Start: 2024-01-07 — End: 2024-01-24

## 2024-01-07 MED ORDER — ASPIRIN 81 MG PO TBEC: 81.0000 mg | DELAYED_RELEASE_TABLET | Freq: Every day | ORAL | 0 refills | Status: DC

## 2024-01-07 MED ORDER — EZETIMIBE 10 MG PO TABS: 10.0000 mg | ORAL_TABLET | Freq: Every day | ORAL | 0 refills | Status: DC

## 2024-01-08 ENCOUNTER — Other Ambulatory Visit: Payer: Self-pay

## 2024-01-09 MED ORDER — ATORVASTATIN CALCIUM 80 MG PO TABS: 80.0000 mg | ORAL_TABLET | Freq: Every day | ORAL | 0 refills | Status: DC

## 2024-01-21 ENCOUNTER — Emergency Department (HOSPITAL_COMMUNITY)

## 2024-01-21 ENCOUNTER — Other Ambulatory Visit: Payer: Self-pay

## 2024-01-21 ENCOUNTER — Emergency Department (EMERGENCY_DEPARTMENT_HOSPITAL)

## 2024-01-21 ENCOUNTER — Encounter (HOSPITAL_COMMUNITY): Payer: Self-pay

## 2024-01-21 ENCOUNTER — Emergency Department (HOSPITAL_COMMUNITY)
Admission: EM | Admit: 2024-01-21 | Discharge: 2024-01-21 | Disposition: A | Discharge status: Home / Self Care | Attending: Emergency Medicine | Admitting: Emergency Medicine

## 2024-01-21 DIAGNOSIS — R0789 Other chest pain: Secondary | ICD-10-CM | POA: Diagnosis not present

## 2024-01-21 DIAGNOSIS — Z79899 Other long term (current) drug therapy: Secondary | ICD-10-CM | POA: Insufficient documentation

## 2024-01-21 DIAGNOSIS — I10 Essential (primary) hypertension: Secondary | ICD-10-CM | POA: Insufficient documentation

## 2024-01-21 DIAGNOSIS — M7989 Other specified soft tissue disorders: Secondary | ICD-10-CM

## 2024-01-21 DIAGNOSIS — R079 Chest pain, unspecified: Secondary | ICD-10-CM

## 2024-01-21 DIAGNOSIS — Z91040 Latex allergy status: Secondary | ICD-10-CM | POA: Diagnosis not present

## 2024-01-21 DIAGNOSIS — E042 Nontoxic multinodular goiter: Secondary | ICD-10-CM | POA: Diagnosis not present

## 2024-01-21 DIAGNOSIS — R918 Other nonspecific abnormal finding of lung field: Secondary | ICD-10-CM | POA: Diagnosis not present

## 2024-01-21 HISTORY — DX: Cerebral infarction, unspecified: I63.9

## 2024-01-21 LAB — CBC
HCT: 34.2 % — ABNORMAL LOW (ref 36.0–46.0)
Hemoglobin: 11 g/dL — ABNORMAL LOW (ref 12.0–15.0)
MCH: 26.2 pg (ref 26.0–34.0)
MCHC: 32.2 g/dL (ref 30.0–36.0)
MCV: 81.4 fL (ref 80.0–100.0)
Platelets: 425 K/uL — ABNORMAL HIGH (ref 150–400)
RBC: 4.2 MIL/uL (ref 3.87–5.11)
RDW: 16.4 % — ABNORMAL HIGH (ref 11.5–15.5)
WBC: 8.3 K/uL (ref 4.0–10.5)
nRBC: 0 % (ref 0.0–0.2)

## 2024-01-21 LAB — BASIC METABOLIC PANEL WITH GFR
Anion gap: 11 (ref 5–15)
BUN: 18 mg/dL (ref 6–20)
CO2: 24 mmol/L (ref 22–32)
Calcium: 9.5 mg/dL (ref 8.9–10.3)
Chloride: 102 mmol/L (ref 98–111)
Creatinine, Ser: 1 mg/dL (ref 0.44–1.00)
GFR, Estimated: >60 mL/min (ref >60)
Glucose, Bld: 118 mg/dL — ABNORMAL HIGH (ref 70–99)
Potassium: 3.4 mmol/L — ABNORMAL LOW (ref 3.5–5.1)
Sodium: 137 mmol/L (ref 135–145)

## 2024-01-21 LAB — I-STAT CHEM 8, ED
BUN: 19 mg/dL (ref 6–20)
Calcium, Ion: 1.16 mmol/L (ref 1.15–1.40)
Chloride: 101 mmol/L (ref 98–111)
Creatinine, Ser: 1.1 mg/dL — ABNORMAL HIGH (ref 0.44–1.00)
Glucose, Bld: 114 mg/dL — ABNORMAL HIGH (ref 70–99)
HCT: 36 % (ref 36.0–46.0)
Hemoglobin: 12.2 g/dL (ref 12.0–15.0)
Potassium: 3.7 mmol/L (ref 3.5–5.1)
Sodium: 138 mmol/L (ref 135–145)
TCO2: 26 mmol/L (ref 22–32)

## 2024-01-21 LAB — TROPONIN I (HIGH SENSITIVITY)
Troponin I (High Sensitivity): 3 ng/L (ref <18)
Troponin I (High Sensitivity): 3 ng/L (ref <18)

## 2024-01-21 MED ORDER — IOHEXOL 350 MG/ML SOLN
75.0000 mL | Freq: Once | INTRAVENOUS | Status: AC | PRN
  Administered 2024-01-21: 75 mL via INTRAVENOUS

## 2024-01-21 MED ORDER — METHOCARBAMOL 500 MG PO TABS
1000.0000 mg | ORAL_TABLET | Freq: Three times a day (TID) | ORAL | 0 refills | Status: DC | PRN
Start: 2024-01-21 — End: 2024-01-30

## 2024-01-21 MED ORDER — POTASSIUM CHLORIDE CRYS ER 20 MEQ PO TBCR
40.0000 meq | EXTENDED_RELEASE_TABLET | Freq: Once | ORAL | Status: AC
  Administered 2024-01-21: 40 meq via ORAL
  Filled 2024-01-21: qty 2

## 2024-01-21 MED ORDER — KETOROLAC TROMETHAMINE 15 MG/ML IJ SOLN
15.0000 mg | Freq: Once | INTRAMUSCULAR | Status: AC
  Administered 2024-01-21: 15 mg via INTRAVENOUS
  Filled 2024-01-21: qty 1

## 2024-01-21 MED ORDER — METHOCARBAMOL 500 MG PO TABS
1000.0000 mg | ORAL_TABLET | Freq: Once | ORAL | Status: AC
  Administered 2024-01-21: 1000 mg via ORAL
  Filled 2024-01-21: qty 2

## 2024-01-21 NOTE — Discharge Instructions (Addendum)
 Your test results today were reassuring.  A prescription for a muscle relaxer was sent to your pharmacy.  Take this as needed.  Follow-up with cardiology.  Return to the emergency department for any new or worsening symptoms of concern.

## 2024-01-21 NOTE — ED Notes (Signed)
 Paper work reviewed with pt. Pt is leaving for home traveling with daughter. Pt is in no new onset distress at thsi time.

## 2024-01-21 NOTE — ED Triage Notes (Signed)
 Pt c/o right sided chest pain radiating into right shoulder that started yesterday at approximately 1600 and has gotten worse. Pt c/o headache. Pt denies shortness of breath, nausea or vomiting. Pt states she has numbness and tingling on right side of neck. Pt states she had similar symptoms when she had a stroke previously. Pt states she has blurred vision in both eyes, denies dizziness.

## 2024-01-21 NOTE — Progress Notes (Signed)
 Right upper extremity venous duplex has been completed.  Results can be found in chart review under CV Proc.  01/21/2024 12:59 PM  Dior Dominik Elden Appl, RVT.

## 2024-01-21 NOTE — ED Notes (Signed)
 Pt to xray

## 2024-01-21 NOTE — ED Provider Triage Note (Signed)
 Emergency Medicine Provider Triage Evaluation Note  Phelan Goers , a 53 y.o. female  was evaluated in triage.  Pt complains of right-sided chest wall pain, right arm pain and a lump in my armpit.  Patient says symptoms started yesterday.  She has a prior history of a CVA.SABRA  Review of Systems  Positive:  Negative:   Physical Exam  BP (!) 154/81 (BP Location: Right Arm)   Pulse 83   Temp 98 F (36.7 C)   Resp 18   Ht 5' 5 (1.651 m)   Wt 95.3 kg   LMP 06/24/2018   SpO2 100%   BMI 34.95 kg/m  Gen:   Awake, no distress   Resp:  Normal effort  MSK:   Moves extremities without difficulty  Other:    Medical Decision Making  Medically screening exam initiated at 8:55 AM.  Appropriate orders placed.  Greysen Swanton was informed that the remainder of the evaluation will be completed by another provider, this initial triage assessment does not replace that evaluation, and the importance of remaining in the ED until their evaluation is complete.  53 year old female here today with right-sided chest wall pain, right arm pain, radiation of pain in the back.  Lower suspicion for CVA as patient does not have any weakness, no sensory deficits.  Her primary symptom is pain.  Will obtain CTA to assess for dissection, chest pain workup initiated.  Will also obtain ultrasound of the patient's right upper extremity.   Mannie Pac T, DO 01/21/24 [phone]

## 2024-01-21 NOTE — ED Provider Notes (Signed)
 [place] EMERGENCY DEPARTMENT AT [facility] Provider Note   CSN: [id number] Arrival date & time: 01/21/24  9173     Patient presents with: Chest Pain   Susan Sanders is a 53 y.o. female.    Chest Pain Patient presents for right sided chest wall pain and concern of axillary swelling.  Medical history includes HTN, GERD, CVA, depression.  Her cardiologist is Dr. Burnard.  Echocardiogram in May showed no abnormal findings.  She had CVA in May.  Per chart review, symptoms at the time were right arm weakness and numbness, speech difficulty.  Yesterday she noticed discomfort in her right shoulder, radiating to right upper chest and right sided neck, and a right arm heaviness.  She denies any associated numbness.  She also noticed a tender swelling under her armpit.     Prior to Admission medications   Medication Sig Start Date End Date Taking? Authorizing Provider  methocarbamol  (ROBAXIN ) 500 MG tablet Take 2 tablets (1,000 mg total) by mouth every 8 (eight) hours as needed for muscle spasms. 01/21/24  Yes Melvenia Motto, MD  amLODipine  (NORVASC ) 10 MG tablet Take 10 mg by mouth daily. 08/07/23   [provider]  aspirin  EC 81 MG tablet Take 1 tablet (81 mg total) by mouth daily. Swallow whole. 01/07/24   Nicholas Bar, MD  atorvastatin  (LIPITOR) 80 MG tablet Take 1 tablet (80 mg total) by mouth daily. 01/09/24   Nicholas Bar, MD  ezetimibe  (ZETIA ) 10 MG tablet Take 1 tablet (10 mg total) by mouth daily. 01/07/24   Nicholas Bar, MD  losartan -hydrochlorothiazide  (HYZAAR) 50-12.5 MG tablet Take 1 tablet by mouth daily. 01/07/24   Nicholas Bar, MD  Olmesartan -amLODIPine -HCTZ 40-10-12.5 MG TABS Take 1 tablet by mouth daily. 01/15/24   [provider]    Allergies: Amoxicillin, Latex, Naproxen , and Tramadol     Review of Systems  Cardiovascular:  Positive for chest pain.  Musculoskeletal:  Positive for arthralgias and neck pain.  All other systems reviewed and are  negative.   Updated Vital Signs BP 139/76   Pulse 88   Temp 99 F (37.2 C) (Oral)   Resp 18   Ht 5' 5 (1.651 m)   Wt 95.3 kg   LMP 06/24/2018   SpO2 100%   BMI 34.95 kg/m   Physical Exam Vitals and nursing note reviewed.  Constitutional:      General: She is not in acute distress.    Appearance: She is well-developed. She is not ill-appearing, toxic-appearing or diaphoretic.  HENT:     Head: Normocephalic and atraumatic.  Eyes:     Extraocular Movements: Extraocular movements intact.     Conjunctiva/sclera: Conjunctivae normal.  Cardiovascular:     Rate and Rhythm: Normal rate and regular rhythm.     Heart sounds: No murmur heard. Pulmonary:     Effort: Pulmonary effort is normal. No tachypnea or respiratory distress.     Breath sounds: Normal breath sounds.  Chest:     Chest wall: Tenderness present.  Abdominal:     Palpations: Abdomen is soft.     Tenderness: There is no abdominal tenderness.  Musculoskeletal:        General: No swelling. Normal range of motion.     Cervical back: Normal range of motion and neck supple.     Right lower leg: No edema.     Left lower leg: No edema.  Skin:    General: Skin is warm and dry.     Coloration:  Skin is not cyanotic or pale.  Neurological:     General: No focal deficit present.     Mental Status: She is alert and oriented to person, place, and time.  Psychiatric:        Mood and Affect: Mood normal.        Behavior: Behavior normal.     (all labs ordered are listed, but only abnormal results are displayed) Labs Reviewed  BASIC METABOLIC PANEL WITH GFR - Abnormal; Notable for the following components:      Result Value   Potassium 3.4 (*)    Glucose, Bld 118 (*)    All other components within normal limits  CBC - Abnormal; Notable for the following components:   Hemoglobin 11.0 (*)    HCT 34.2 (*)    RDW 16.4 (*)    Platelets 425 (*)    All other components within normal limits  I-STAT CHEM 8, ED - Abnormal;  Notable for the following components:   Creatinine, Ser 1.10 (*)    Glucose, Bld 114 (*)    All other components within normal limits  TROPONIN I (HIGH SENSITIVITY)  TROPONIN I (HIGH SENSITIVITY)    EKG: EKG Interpretation Date/Time:  Tuesday January 21 2024 08:32:30 EDT Ventricular Rate:  79 PR Interval:  140 QRS Duration:  82 QT Interval:  378 QTC Calculation: 433 R Axis:   62  Text Interpretation: Normal sinus rhythm Confirmed by Melvenia Motto [phone]) on 01/21/2024 4:22:05 PM  Radiology: UE VENOUS DUPLEX (7am - 7pm) Result Date: 01/21/2024 UPPER VENOUS STUDY  Patient Name:  Susan Sanders  Date of Exam:   01/21/2024 Medical Rec #: [id number]          Accession #:    [id number] Date of Birth: May 23, 1970           Patient Gender: F Patient Age:   33 years Exam Location:  [facility] Procedure:      VAS US  UPPER EXTREMITY VENOUS DUPLEX Referring Phys: FAIRY STEVENS --------------------------------------------------------------------------------  Indications: Pain, and heaviness, history of stroke May 2025. Comparison Study: No prior exam. Performing Technologist: Edilia Elden Appl  Examination Guidelines: A complete evaluation includes B-mode imaging, spectral Doppler, color Doppler, and power Doppler as needed of all accessible portions of each vessel. Bilateral testing is considered an integral part of a complete examination. Limited examinations for reoccurring indications may be performed as noted.  Right Findings: +----------+------------+---------+-----------+----------+-------+ RIGHT     CompressiblePhasicitySpontaneousPropertiesSummary +----------+------------+---------+-----------+----------+-------+ IJV           Full       Yes       Yes                      +----------+------------+---------+-----------+----------+-------+ Subclavian               Yes       Yes                      +----------+------------+---------+-----------+----------+-------+ Axillary       Full       Yes       Yes                      +----------+------------+---------+-----------+----------+-------+ Brachial      Full       Yes       Yes                      +----------+------------+---------+-----------+----------+-------+  Radial        Full                                          +----------+------------+---------+-----------+----------+-------+ Ulnar         Full                                          +----------+------------+---------+-----------+----------+-------+ Cephalic      Full       Yes       Yes                      +----------+------------+---------+-----------+----------+-------+ Basilic       Full       Yes       Yes                      +----------+------------+---------+-----------+----------+-------+ Multiple nodules noted in the right thyroid  lobe, most prominent measuring 0.82 x 0.47 x 0.71 cm.  Left Findings: +----------+------------+---------+-----------+----------+-------+ LEFT      CompressiblePhasicitySpontaneousPropertiesSummary +----------+------------+---------+-----------+----------+-------+ IJV           Full       Yes       Yes                      +----------+------------+---------+-----------+----------+-------+ Subclavian               Yes       Yes                      +----------+------------+---------+-----------+----------+-------+ Multiple nodules noted in the left thyroid  lobe, most prominent measuring 1.6 x 0.74 x 1.2 cm.  Summary:  Right: No evidence of deep vein thrombosis in the upper extremity. No evidence of superficial vein thrombosis in the upper extremity.  Left: No evidence of thrombosis in the subclavian.  *See table(s) above for measurements and observations.  Diagnosing physician: Penne Colorado MD Electronically signed by Penne Colorado MD on 01/21/2024 at 3:33:50 PM.    Final    CT Angio Chest Aorta w/CM &/OR wo/CM Result Date: 01/21/2024 CLINICAL DATA:  Right chest wall pain,  right arm pain and palpable mass in the right axilla. The patient reports the symptoms started yesterday. Clinical concern for acute aortic syndrome. EXAM: CT ANGIOGRAPHY CHEST WITH CONTRAST TECHNIQUE: Multidetector CT imaging of the chest was performed using the standard protocol during bolus administration of intravenous contrast. Multiplanar CT image reconstructions and MIPs were obtained to evaluate the vascular anatomy. RADIATION DOSE REDUCTION: This exam was performed according to the departmental dose-optimization program which includes automated exposure control, adjustment of the mA and/or kV according to patient size and/or use of iterative reconstruction technique. CONTRAST:  75mL OMNIPAQUE  IOHEXOL  350 MG/ML SOLN COMPARISON:  10/09/2023. Chest radiographs dated 01/21/2024 and 10/09/2023. FINDINGS: Cardiovascular: Preferential opacification of the thoracic aorta. No evidence of thoracic aortic aneurysm or dissection. Normal heart size. No pericardial effusion. Mediastinum/Nodes: Stable diffusely enlarged thyroid  gland containing multiple nodules, the largest measuring 1.1 cm. This does not need imaging follow-up. Unremarkable trachea and esophagus. No enlarged lymph nodes. Lungs/Pleura: Minimal right basilar linear atelectasis or scarring. Otherwise clear lungs. No pneumothorax or pleural fluid. Upper Abdomen: Unremarkable. Musculoskeletal: Mild scoliosis and mild thoracic spine  degenerative changes. Review of the MIP images confirms the above findings. IMPRESSION: 1. No acute abnormality. Specifically, no evidence of thoracic aortic aneurysm or dissection. 2. Stable multinodular goiter. Electronically Signed   By: Elspeth Bathe M.D.   On: 01/21/2024 11:16   DG Chest 2 View Result Date: 01/21/2024 CLINICAL DATA:  53 year old female with chest pain onset last night, radiating to right arm. EXAM: CHEST - 2 VIEW COMPARISON:  CTA chest 10/09/2023 and earlier. FINDINGS: PA and lateral views 0856 hours. Chronic  S-shaped thoracolumbar scoliosis. Normal cardiac size and mediastinal contours. Visualized tracheal air column is within normal limits. Mild asymmetric streaky lower lung peribronchial opacity on the PA view is not correlated on the lateral. No pneumothorax, pulmonary edema, pleural effusion. And no other confluent lung opacity. No acute osseous abnormality identified. Negative visible bowel gas. IMPRESSION: 1. Mild streaky lung base opacity is nonspecific, but more resembles atelectasis than infection. 2. No other acute cardiopulmonary abnormality. Chronic S-shaped thoracolumbar scoliosis. Electronically Signed   By: VEAR Hurst M.D.   On: 01/21/2024 09:17     Procedures   Medications Ordered in the ED  potassium chloride  SA (KLOR-CON  M) CR tablet 40 mEq (has no administration in time range)  iohexol  (OMNIPAQUE ) 350 MG/ML injection 75 mL (75 mLs Intravenous Contrast Given 01/21/24 1010)  ketorolac  (TORADOL ) 15 MG/ML injection 15 mg (15 mg Intravenous Given 01/21/24 1817)  methocarbamol  (ROBAXIN ) tablet 1,000 mg (1,000 mg Oral Given 01/21/24 1817)                                    Medical Decision Making Amount and/or Complexity of Data Reviewed Labs: ordered. Radiology: ordered.  Risk Prescription drug management.   This patient presents to the ED for concern of chest pain, this involves an extensive number of treatment options, and is a complaint that carries with it a high risk of complications and morbidity.  The differential diagnosis includes ACS, pericarditis, PE, musculoskeletal etiology   Co morbidities / Chronic conditions that complicate the patient evaluation  HTN, GERD, CVA, depression   Additional history obtained:  Additional history obtained from EMR External records from outside source obtained and reviewed including N/A   Lab Tests:  I Ordered, and personally interpreted labs.  The pertinent results include: Normal kidney function, slight hypokalemia with otherwise  normal electrolytes, normal troponins x 2   Imaging Studies ordered:  I ordered imaging studies including right arm DVT study, chest x-ray, CTA chest I independently visualized and interpreted imaging which showed no acute findings I agree with the radiologist interpretation   Cardiac Monitoring: / EKG:  The patient was maintained on a cardiac monitor.  I personally viewed and interpreted the cardiac monitored which showed an underlying rhythm of: Sinus rhythm   Problem List / ED Course / Critical interventions / Medication management  Patient presenting for right shoulder pain with radiation to right side of chest and right neck since yesterday.  She denies any known injuries.  She did have a recent CVA in May with right-sided deficits at the time.  She is currently on DAPT.  Prior being bedded in the ED, workup was initiated.  Patient had normal kidney function, slight hypokalemia with otherwise normal electrolytes, normal troponins x 2.  She underwent right upper extremity DVT study and CTA of chest.  Results were negative for any acute findings.  On exam, patient's discomfort is reproducible with palpation.  I suspect musculoskeletal etiology.  Toradol  and Robaxin  were ordered.  On reassessment, patient's pain is improved.  She was discharged in stable condition. I ordered medication including Toradol  and Robaxin  for analgesia; potassium chloride  for hypokalemia Reevaluation of the patient after these medicines showed that the patient improved I have reviewed the patients home medicines and have made adjustments as needed   Social Determinants of Health:  Lives independently      Final diagnoses:  Chest pain, unspecified type    ED Discharge Orders          Ordered    Ambulatory referral to Cardiology       Comments: If you have not heard from the Cardiology office within the next 72 hours please call [phone].   01/21/24 1852    methocarbamol  (ROBAXIN ) 500 MG tablet   Every 8 hours PRN        01/21/24 1853               Melvenia Motto, MD 01/21/24 [phone]

## 2024-01-24 ENCOUNTER — Other Ambulatory Visit: Payer: Self-pay

## 2024-01-24 ENCOUNTER — Ambulatory Visit: Attending: Cardiology | Admitting: Cardiology

## 2024-01-24 ENCOUNTER — Encounter: Payer: Self-pay | Admitting: Cardiology

## 2024-01-24 ENCOUNTER — Telehealth: Payer: Self-pay | Admitting: Cardiology

## 2024-01-24 VITALS — BP 119/82 | HR 83 | Ht 65.0 in | Wt 211.8 lb

## 2024-01-24 DIAGNOSIS — I639 Cerebral infarction, unspecified: Secondary | ICD-10-CM | POA: Insufficient documentation

## 2024-01-24 DIAGNOSIS — R072 Precordial pain: Secondary | ICD-10-CM | POA: Diagnosis not present

## 2024-01-24 DIAGNOSIS — I10 Essential (primary) hypertension: Secondary | ICD-10-CM | POA: Insufficient documentation

## 2024-01-24 DIAGNOSIS — E782 Mixed hyperlipidemia: Secondary | ICD-10-CM | POA: Insufficient documentation

## 2024-01-24 DIAGNOSIS — Z419 Encounter for procedure for purposes other than remedying health state, unspecified: Secondary | ICD-10-CM | POA: Diagnosis not present

## 2024-01-24 MED ORDER — METOPROLOL TARTRATE 100 MG PO TABS
ORAL_TABLET | ORAL | 0 refills | Status: DC
Start: 2024-01-24 — End: 2024-03-16

## 2024-01-24 NOTE — Progress Notes (Signed)
 Cardiology Office Note   Date:  01/24/2024  ID:  Saleen, Peden 1970-07-19, MRN [id number] PCP: Nicholas Bar, MD  [facility] Providers Cardiologist:  None Cardiology APP:  Gerard Frederick, NP     History of Present Illness Susan Sanders is a 53 y.o. female with a past medical history of CVA, chronic back pain, history of syncope, hyperlipidemia, and hypertension, who presents today for follow-up of right sided chest wall pain and recent emergency department visit.   In September 2017 she had experienced an episode of presyncope/syncope and underwent neurologic evaluation.  She was not found to have a seizure.  Echocardiogram revealed an EF of 5 to 6% with normal diastolic parameters.  Carotid duplex imaging showed mild carotid plaque bilaterally with dyspnea present.  It was felt most likely her symptoms were possibly due to hypoglycemia or hypotension.  Because of her hypertension history she fell amlodipine  10 mg daily and was taken a prescription for which she started 5 AM to 10 PM.  She had eaten today.  Not a significant event that day.  Later in the night she developed lightheadedness, weakness, sensation of feeling hot, felt dizzy, vomiting only.  Reportedly blood pressure increased to 200s/100.  She ultimately passed out.  She was unaware of any arrhythmia.  Previously she was seen by her PCP and blood pressure continue to be elevated 170/84.  Thought to represent likely that it was a vasovagal episode but there was a question of slurring noted to her QRS complex ICD.  Labs were obtained.  TSH was normal.  She was found to be anemic with a hemoglobin of 10.4 and a deficiency anemia.  She was ultimately started on HCTZ 12.5 mg by her primary care provider.  She was seen in clinic by Dr. Burnard on 07/25/2017 after her syncopal episode.  EKG revealed sinus rhythm with a rate of 94, with mild upsloping of the R wave without delta wave.  She was started on Toprol -XL 25 mg daily  for resting heart rate of 94 bpm.  On examination she was found to have a click and a short systolic murmur and it was recommended that she have an updated echocardiogram completed.  She has been lost to follow-up since that time.   She was hospitalized at [facility] from 5/28 - 10/10/2023.  She had presented to the emergency department due to right sided weakness, numbness, and back pain.  She was having difficulty speaking and had been unable to pick up things with her right side.  Family was concerned at that time she may have had a stroke.  In the emergency department blood pressure was noted to be over 200.  Chest x-ray showed no acute findings.  CTA of the head and neck was normal.  CT of the chest abdomen pelvis showed no evidence of dissection.  ED provider discussed her case with neurology she was admitted for this along MRI was ordered.  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  Echocardiogram was completed which revealed a normal EF with no intra atrial shunting.  She was started on aspirin  81 mg daily and clopidogrel  75 mg daily for 3 weeks to be followed by aspirin  81 mg alone.  Her LDL was 123 and she was started on statin therapy.  She was to follow-up with neurology as an outpatient.  For blood pressure management she was continued on  losartan  50 mg daily, amlodipine  10 mg daily, and HCTZ 12.5 mg daily.  She was evaluated in the [facility] emergency department on 12/23/2023 with complaints of right sided chest discomfort.  She started noticing discomfort in her right shoulder, radiating to her right upper chest and right sided neck with right arm heaviness.  She denied any associated numbness.  She also noticed tender swelling under her armpit.  Pertinent labs revealed potassium 3.4, blood glucose 118, hemoglobin 11.0, platelets 425, serum creatinine 1.10, blood glucose of 114.   She was treated with potassium, Toradol , and Robaxin .  With a recent CVA in May with right-sided deficits she was currently continued on DAPT.  Lab was unrevealing.  She underwent right upper extremity DVT study and CTA of the chest.  Results were negative for any acute findings.  She was recommended to follow-up with cardiology and was able to be discharged home.  She returns to clinic today stating that she has been doing OK. She continues to have right arm and chest pain that feels like a pulling and tightening sensation. She will sometimes have associated shortness of breath. She continues to work as a Lawyer. There is often a lot of pulling and pushing with her job but she states that this has felt different since having her CVA in May.  She stated when she had her stroke she was advised that she continue to have any chest pain or arm pain that she should follow-up with cardiology.  She states that she has been compliant with her current medication regimen.  Has been taking previously prescribed Robaxin  she was given in the emergency department.  ROS: 10 point review of systems has been reviewed and considered negative the exception was been listed in the HPI  Studies Reviewed EKG Interpretation Date/Time:  Friday January 24 2024 09:24:43 EDT Ventricular Rate:  79 PR Interval:  138 QRS Duration:  82 QT Interval:  374 QTC Calculation: 428 R Axis:   13  Text Interpretation: Normal sinus rhythm Normal ECG When compared with ECG of 21-Jan-2024 08:32, No significant change was found Confirmed by Gerard Frederick ([id number]) on 01/24/2024 9:28:53 AM    2d echo 10/10/2023 1. Left ventricular ejection fraction, by estimation, is 60 to 65%. The  left ventricle has normal function. The left ventricle has no regional  wall motion abnormalities. There is mild concentric left ventricular  hypertrophy. Left ventricular diastolic  parameters were normal.   2. Right ventricular systolic function is normal. The  right ventricular  size is normal.   3. The mitral valve is normal in structure. Trivial mitral valve  regurgitation. No evidence of mitral stenosis. The mean mitral valve  gradient is 1.0 mmHg.   4. The aortic valve is normal in structure. Aortic valve regurgitation is  not visualized. No aortic stenosis is present. Aortic valve Vmax measures  1.32 m/s.   5. The inferior vena cava is normal in size with greater than 50%  respiratory variability, suggesting right atrial pressure of 3 mmHg.   2d echo 08/09/2017 Study Conclusions  - Left ventricle: The cavity size was normal. There was mild    concentric hypertrophy. Systolic function was normal. The    estimated ejection fraction was in the range of 60% to 65%. Wall    motion was normal; there were no regional wall motion    abnormalities. Left ventricular diastolic function parameters    were normal.  - Aortic valve: Trileaflet; mildly thickened, mildly calcified  leaflets.  - Right ventricle: The cavity size was at the upper limits of    normal. Wall thickness was normal.  - Tricuspid valve: There was trivial regurgitation.  - Pulmonic valve: There was trivial regurgitation.  Risk Assessment/Calculations         Physical Exam VS:  BP 119/82 (BP Location: Left Arm, Patient Position: Sitting, Cuff Size: Normal)   Pulse 83   Ht 5' 5 (1.651 m)   Wt 211 lb 12.8 oz (96.1 kg)   LMP 06/24/2018   SpO2 99%   BMI 35.25 kg/m        Wt Readings from Last 3 Encounters:  01/24/24 211 lb 12.8 oz (96.1 kg)  01/21/24 210 lb (95.3 kg)  12/10/23 209 lb 9.6 oz (95.1 kg)    GEN: Well nourished, well developed in no acute distress NECK: No JVD; No carotid bruits CARDIAC: RRR, no murmurs, rubs, gallops RESPIRATORY:  Clear to auscultation without rales, wheezing or rhonchi  ABDOMEN: Soft, non-tender, non-distended EXTREMITIES:  No edema; No deformity   ASSESSMENT AND PLAN Chest pain with typical and atypical features, right-sided chest  pain that she states has been happening since she had her stroke.  She has been continued on muscle relaxers that she had in the emergency department which did not change the discomfort.  Unfortunately she does a lot of lifting at work but with ongoing symptoms she is concerned that there may be heart related.  EKG today reveals sinus rhythm with a rate of 70 with no acute ischemic changes noted.  With ongoing chest discomfort that she is having she has been scheduled for coronary CTA to rule out any ischemic causes as she has several risk factors.  CVA with recent hospitalization in May 2024.  She has been continued on aspirin  81 mg daily, atorvastatin  80 mg daily and ezetimibe  10 mg daily.  Previous echocardiogram completed with negative bubble study.  She was mailed a ZIO AT monitor to rule out arrhythmic causes of her CVA which she has yet to wear.  Expressed the importance of her wearing the monitor to rule out an arrhythmia such as atrial fibrillation and atrial flutter that can cause increased risk of stroke.  Patient states that she will call to have her monitor placed this afternoon.  Hypertension with blood pressure today 119/82.  She has no longer on the amlodipine  10 mg daily but is currently on olmesartan /amlodipine /HCTZ 40/10/12 0.5 mg daily.  She has been encouraged to continue to monitor her blood pressure 1 to 2 hours postmedication administration as well.  Hyperlipidemia with last LDL of 82.  She has been continued on atorvastatin  80 mg daily and ezetimibe  10 mg daily.  Would benefit from updated lipid panel on return.       Dispo: Patient to return to clinic to see MD/APP once testing is completed or sooner if needed for further evaluation.  Signed, Teddy Rebstock, NP

## 2024-01-24 NOTE — Patient Instructions (Signed)
 Medication Instructions:  Your physician recommends that you continue on your current medications as directed. Please refer to the Current Medication list given to you today.   *If you need a refill on your cardiac medications before your next appointment, please call your pharmacy*  Lab Work: Your provider would like for you to have following labs drawn today BMP.   If you have labs (blood work) drawn today and your tests are completely normal, you will receive your results only by: MyChart Message (if you have MyChart) OR A paper copy in the mail If you have any lab test that is abnormal or we need to change your treatment, we will call you to review the results.  Testing/Procedures:   Your cardiac CT will be scheduled at one of the below locations:   [facility] [address] [place], [place] [address] [phone]  If scheduled at [facility], please arrive to the Heart and Vascular Center 15 mins early for check-in and test prep.  There is spacious parking and easy access to the radiology department from the [facility] Heart and Vascular entrance. Please enter here and check-in with the desk attendant.   Please follow these instructions carefully (unless otherwise directed):  An IV will be required for this test and Nitroglycerin  will be given.  Hold all erectile dysfunction medications at least 3 days (72 hrs) prior to test. (Ie viagra, cialis, sildenafil, tadalafil, etc)   On the Night Before the Test: Be sure to Drink plenty of water . Do not consume any caffeinated/decaffeinated beverages or chocolate 12 hours prior to your test. Do not take any antihistamines 12 hours prior to your test.  On the Day of the Test: Drink plenty of water  until 1 hour prior to the test. Do not eat any food 1 hour prior to test. You may take your regular medications prior to the test.  Take metoprolol  (Lopressor ) two hours prior to test. If you take  Furosemide/Hydrochlorothiazide /Spironolactone /Chlorthalidone , please HOLD on the morning of the test. Patients who wear a continuous glucose monitor MUST remove the device prior to scanning. FEMALES- please wear underwire-free bra if available, avoid dresses & tight clothing       After the Test: Drink plenty of water . After receiving IV contrast, you may experience a mild flushed feeling. This is normal. On occasion, you may experience a mild rash up to 24 hours after the test. This is not dangerous. If this occurs, you can take Benadryl  25 mg, Zyrtec , Claritin, or Allegra and increase your fluid intake. (Patients taking Tikosyn should avoid Benadryl , and may take Zyrtec , Claritin, or Allegra) If you experience trouble breathing, this can be serious. If it is severe call 911 IMMEDIATELY. If it is mild, please call our office.  We will call to schedule your test 2-4 weeks out understanding that some insurance companies will need an authorization prior to the service being performed.   For more information and frequently asked questions, please visit our website : [email or web address]  For non-scheduling related questions, please contact the cardiac imaging nurse navigator should you have any questions/concerns: Cardiac Imaging Nurse Navigators Direct Office Dial: [phone]   For scheduling needs, including cancellations and rescheduling, please call [place], [phone].   Follow-Up: At [facility], you and your health needs are our priority.  As part of our continuing mission to provide you with exceptional heart care, our providers are all part of one team.  This team includes your primary Cardiologist (physician)  and Advanced Practice Providers or APPs (Physician Assistants and Nurse Practitioners) who all work together to provide you with the care you need, when you need it.  Your next appointment:   6 - 8 week(s) or after CTA  Provider:   You may see one  of the following Advanced Practice Providers on your designated Care Team:   Lonni Meager, NP Lesley Maffucci, PA-C Bernardino Bring, PA-C Cadence Edmonston, PA-C Tylene Lunch, NP Barnie Hila, NP

## 2024-01-24 NOTE — Telephone Encounter (Signed)
  Patient states she is calling back to let Jon know about a medication she is taking. Yes, she is taking the blood pressure medication that is a mixture. Olmesartan -amLODIPine -HCTZ 40-10-12.5 MG TABS

## 2024-01-25 LAB — BASIC METABOLIC PANEL WITH GFR
BUN/Creatinine Ratio: 15 (ref 9–23)
BUN: 14 mg/dL (ref 6–24)
CO2: 22 mmol/L (ref 20–29)
Calcium: 9.7 mg/dL (ref 8.7–10.2)
Chloride: 100 mmol/L (ref 96–106)
Creatinine, Ser: 0.92 mg/dL (ref 0.57–1.00)
Glucose: 82 mg/dL (ref 70–99)
Potassium: 4.3 mmol/L (ref 3.5–5.2)
Sodium: 138 mmol/L (ref 134–144)
eGFR: 74 mL/min/1.73 (ref >59)

## 2024-01-27 ENCOUNTER — Ambulatory Visit: Payer: Self-pay | Admitting: Cardiology

## 2024-01-27 NOTE — Progress Notes (Signed)
 Labs are stable with no change noted in kidney function.  No changes needed to current medication regimen at this time.

## 2024-01-30 ENCOUNTER — Ambulatory Visit (INDEPENDENT_AMBULATORY_CARE_PROVIDER_SITE_OTHER): Admitting: Family Medicine

## 2024-01-30 VITALS — BP 124/78 | HR 100 | Ht 65.0 in | Wt 210.4 lb

## 2024-01-30 DIAGNOSIS — S134XXA Sprain of ligaments of cervical spine, initial encounter: Secondary | ICD-10-CM | POA: Insufficient documentation

## 2024-01-30 MED ORDER — CYCLOBENZAPRINE HCL 5 MG PO TABS: 5.0000 mg | ORAL_TABLET | Freq: Three times a day (TID) | ORAL | 1 refills | Status: DC | PRN
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
## 2024-01-31 ENCOUNTER — Ambulatory Visit (HOSPITAL_COMMUNITY)

## 2024-02-02 ENCOUNTER — Other Ambulatory Visit: Payer: Self-pay | Admitting: Family Medicine

## 2024-02-02 DIAGNOSIS — I10 Essential (primary) hypertension: Secondary | ICD-10-CM

## 2024-02-04 ENCOUNTER — Encounter (HOSPITAL_COMMUNITY): Payer: Self-pay

## 2024-02-06 ENCOUNTER — Ambulatory Visit (HOSPITAL_COMMUNITY)
Admission: RE | Admit: 2024-02-06 | Discharge: 2024-02-06 | Disposition: A | Discharge status: Home / Self Care | Source: Ambulatory Visit | Attending: Cardiology | Admitting: Cardiology

## 2024-02-06 DIAGNOSIS — R072 Precordial pain: Secondary | ICD-10-CM

## 2024-02-06 DIAGNOSIS — I2510 Atherosclerotic heart disease of native coronary artery without angina pectoris: Secondary | ICD-10-CM | POA: Insufficient documentation

## 2024-02-06 MED ORDER — NITROGLYCERIN 0.4 MG SL SUBL
0.8000 mg | SUBLINGUAL_TABLET | Freq: Once | SUBLINGUAL | Status: AC
  Administered 2024-02-06: 0.8 mg via SUBLINGUAL

## 2024-02-06 MED ORDER — IOHEXOL 350 MG/ML SOLN
100.0000 mL | Freq: Once | INTRAVENOUS | Status: AC | PRN
Start: 2024-02-06 — End: 2024-02-06
  Administered 2024-02-06: 100 mL via INTRAVENOUS

## 2024-02-07 ENCOUNTER — Other Ambulatory Visit: Payer: Self-pay | Admitting: Family Medicine

## 2024-02-07 DIAGNOSIS — E785 Hyperlipidemia, unspecified: Secondary | ICD-10-CM

## 2024-02-07 NOTE — Telephone Encounter (Signed)
 Patient presents to clinic for refill on Ezetimibe .   She voices frustration that we keep sending in the wrong medication.   Today is the first refill request that we have received from the pharmacy.   Per chart review, patient picked up medication on 8/26.   Forwarding request to PCP.   Chiquita JAYSON English, RN

## 2024-02-10 NOTE — Telephone Encounter (Signed)
 Called patient to review  results, patient verbalized understanding and agreement with plan - NOV 11/6

## 2024-02-10 NOTE — Progress Notes (Signed)
 Coronary calcium  score of 24.8.  Minimal nonobstructive disease noted.  Consider cardiac causes of chest pain.  Preventative therapy and risk factor modification.  Over read of the chest CT to be completed by radiology.

## 2024-02-14 NOTE — Progress Notes (Signed)
 No additional findings found on chest CT portion of the scan read by radiologist.  Overall reassuring study.

## 2024-03-09 ENCOUNTER — Other Ambulatory Visit: Payer: Self-pay | Admitting: Family Medicine

## 2024-03-09 ENCOUNTER — Telehealth: Payer: Self-pay | Admitting: Family Medicine

## 2024-03-09 DIAGNOSIS — I10 Essential (primary) hypertension: Secondary | ICD-10-CM

## 2024-03-09 DIAGNOSIS — E785 Hyperlipidemia, unspecified: Secondary | ICD-10-CM

## 2024-03-09 MED ORDER — OLMESARTAN-AMLODIPINE-HCTZ 40-10-12.5 MG PO TABS: 1.0000 | ORAL_TABLET | Freq: Every day | ORAL | 1 refills | Status: AC

## 2024-03-09 NOTE — Telephone Encounter (Signed)
 Patient request refill of:  Name of Medication(s):  Ezetimibe , Losartan -hydrochlorothiazide , Olmesartan -amLODIPine -HCTZ Last date of OV:  01/30/2024 Pharmacy:  [facility] [address] [place]  Will route refill request to Team CMA.  Discussed with patient policy to call pharmacy for future refills.  Also, discussed refills may take up to 48 hours to approve or deny.  Susan Sanders

## 2024-03-10 ENCOUNTER — Other Ambulatory Visit: Payer: Self-pay | Admitting: Family Medicine

## 2024-03-10 DIAGNOSIS — I10 Essential (primary) hypertension: Secondary | ICD-10-CM

## 2024-03-13 ENCOUNTER — Telehealth: Payer: Self-pay

## 2024-03-13 ENCOUNTER — Other Ambulatory Visit: Payer: Self-pay | Admitting: Family Medicine

## 2024-03-13 DIAGNOSIS — I10 Essential (primary) hypertension: Secondary | ICD-10-CM

## 2024-03-13 NOTE — Telephone Encounter (Signed)
 Patient calls nurse line in regards to losartan  potassium-hydrochlorothiazide .  She reports frustration over why this medication has not been called in yet.   Advised it appears she is no longer on this medication and this was changed.  Advised of olmesartan -amlodipine -hydrochlorothiazide .  She reports she doesn't recall this change.  Advised will forward to PCP for clarification.

## 2024-03-16 ENCOUNTER — Telehealth: Payer: Self-pay | Admitting: Family Medicine
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
## 2024-03-17 ENCOUNTER — Ambulatory Visit: Payer: Self-pay

## 2024-03-19 ENCOUNTER — Ambulatory Visit: Admitting: Cardiology

## 2024-03-27 ENCOUNTER — Ambulatory Visit: Admitting: Cardiology

## 2024-03-27 NOTE — Progress Notes (Deleted)
 Cardiology Office Note   Date:  03/27/2024  ID:  Susan Sanders, DOB January 24, 1971, MRN [id number] PCP: Nicholas Bar, MD  [facility] Providers Cardiologist:  None Cardiology APP:  Gerard Frederick, NP { Click to update primary MD,subspecialty MD or APP then REFRESH:1}    History of Present Illness Susan Sanders is a 53 y.o. female with past medical history of CVA, chronic back pain, history of syncope, hyperlipidemia, and hypertension, who presents today for follow-up.   In September 2017 she had experienced an episode of presyncope/syncope and underwent neurologic evaluation.  She was not found to have a seizure.  Echocardiogram revealed an EF of 5 to 6% with normal diastolic parameters.  Carotid duplex imaging showed mild carotid plaque bilaterally with dyspnea present.  It was felt most likely her symptoms were possibly due to hypoglycemia or hypotension.  Because of her hypertension history she fell amlodipine  10 mg daily and was taken a prescription for which she started 5 AM to 10 PM.  She had eaten today.  Not a significant event that day.  Later in the night she developed lightheadedness, weakness, sensation of feeling hot, felt dizzy, vomiting only.  Reportedly blood pressure increased to 200s/100.  She ultimately passed out.  She was unaware of any arrhythmia.  Previously she was seen by her PCP and blood pressure continue to be elevated 170/84.  Thought to represent likely that it was a vasovagal episode but there was a question of slurring noted to her QRS complex ICD.  Labs were obtained.  TSH was normal.  She was found to be anemic with a hemoglobin of 10.4 and a deficiency anemia.  She was ultimately started on HCTZ 12.5 mg by her primary care provider.  She was seen in clinic by Dr. Burnard on 07/25/2017 after her syncopal episode.  EKG revealed sinus rhythm with a rate of 94, with mild upsloping of the R wave without delta wave.  She was started on Toprol -XL 25 mg daily for  resting heart rate of 94 bpm.  On examination she was found to have a click and a short systolic murmur and it was recommended that she have an updated echocardiogram completed.  She has been lost to follow-up since that time.  She was hospitalized at [facility] from 5/28 - 10/10/2023. She had presented to the emergency department due to right sided weakness, numbness, and back pain. She was having difficulty speaking and had been unable to pick up things with her right side. Family was concerned at that time she may have had a stroke. In the emergency department blood pressure was noted to be over 200. Chest x-ray showed no acute findings. CTA of the head and neck was normal. CT of the chest abdomen pelvis showed no evidence of dissection. ED provider discussed her case with neurology she was admitted for this along MRI was ordered. 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 Echocardiogram was completed which revealed a normal EF with no intra atrial shunting. She was started on aspirin  81 mg daily and clopidogrel  75 mg daily for 3 weeks to be followed by aspirin  81 mg alone. Her LDL was 123 and she was started on statin therapy. She was to follow-up with neurology as an outpatient. For blood pressure management she was continued on losartan  50 mg daily, amlodipine  10 mg daily, and HCTZ 12.5 mg daily.   She was  evaluated in the [facility] emergency department on 12/23/2023 with complaints of right sided chest discomfort.  She started noticing discomfort in her right shoulder, radiating to her right upper chest and right sided neck with right arm heaviness.  She denied any associated numbness.  She also noticed tender swelling under her armpit.  Pertinent labs revealed potassium 3.4, blood glucose 118, hemoglobin 11.0, platelets 425, serum creatinine 1.10, blood glucose of 114.  She was treated  with potassium, Toradol , and Robaxin .  With a recent CVA in May with right-sided deficits she was currently continued on DAPT.  Lab was unrevealing.  She underwent right upper extremity DVT study and CTA of the chest.  Results were negative for any acute findings.  She was recommended to follow-up with cardiology and was able to be discharged home.   She was last seen in clinic 01/24/2024 states that she was doing okay.  She continued to have some right arm and chest pain feels a couple like a tightening sensation.  With chest discomfort there was ongoing radiation into the arm she was scheduled for coronary CTA to rule out any ischemic causes.  She was mailed a ZIO AT monitor to rule out arrhythmic causes of her CVA which she continued to not complete.  There were no other medication changes that were made at that time.   She returns to clinic today     ROS: 10 point review of system has been reviewed and considered negative exception was been listed in the HPI  Studies Reviewed     cCTA 02/06/24 IMPRESSION: 1. Coronary calcium  score of 24.8. This was 29 percentile for age and sex matched control.   2. Normal coronary origin with right dominance.   3. CAD-RADS 1. Minimal non-obstructive CAD (0-24%). Consider non-atherosclerotic causes of chest pain. Consider preventive therapy and risk factor modification.   The noncardiac portion of this study will be interpreted in separate report by the radiologist.   2d echo 10/10/2023 1. Left ventricular ejection fraction, by estimation, is 60 to 65%. The  left ventricle has normal function. The left ventricle has no regional  wall motion abnormalities. There is mild concentric left ventricular  hypertrophy. Left ventricular diastolic  parameters were normal.   2. Right ventricular systolic function is normal. The right ventricular  size is normal.   3. The mitral valve is normal in structure. Trivial mitral valve  regurgitation. No evidence of  mitral stenosis. The mean mitral valve  gradient is 1.0 mmHg.   4. The aortic valve is normal in structure. Aortic valve regurgitation is  not visualized. No aortic stenosis is present. Aortic valve Vmax measures  1.32 m/s.   5. The inferior vena cava is normal in size with greater than 50%  respiratory variability, suggesting right atrial pressure of 3 mmHg.    2d echo 08/09/2017 Study Conclusions  - Left ventricle: The cavity size was normal. There was mild    concentric hypertrophy. Systolic function was normal. The    estimated ejection fraction was in the range of 60% to 65%. Wall    motion was normal; there were no regional wall motion    abnormalities. Left ventricular diastolic function parameters    were normal.  - Aortic valve: Trileaflet; mildly thickened, mildly calcified    leaflets.  - Right ventricle: The cavity size was at the upper limits of    normal. Wall thickness was normal.  - Tricuspid valve: There was trivial regurgitation.  - Pulmonic valve:  There was trivial regurgitation.  Risk Assessment/Calculations   No BP recorded.  {Refresh Note OR Click here to enter BP  :1}***       Physical Exam VS:  LMP 06/24/2018        Wt Readings from Last 3 Encounters:  01/30/24 210 lb 6.4 oz (95.4 kg)  01/24/24 211 lb 12.8 oz (96.1 kg)  01/21/24 210 lb (95.3 kg)    GEN: Well nourished, well developed in no acute distress NECK: No JVD; No carotid bruits CARDIAC: ***RRR, no murmurs, rubs, gallops RESPIRATORY:  Clear to auscultation without rales, wheezing or rhonchi  ABDOMEN: Soft, non-tender, non-distended EXTREMITIES:  No edema; No deformity   ASSESSMENT AND PLAN Atypical chest pain With CVA Hypertension Hyperlipidemia    {Are you ordering a CV Procedure (e.g. stress test, cath, DCCV, TEE, etc)?   Press F2        :[id number]}  Dispo: ***  Signed, Rhiley Tarver, NP

## 2024-04-03 ENCOUNTER — Other Ambulatory Visit: Payer: Self-pay | Admitting: Family Medicine

## 2024-04-06 ENCOUNTER — Encounter (HOSPITAL_COMMUNITY): Payer: Self-pay

## 2024-04-06 ENCOUNTER — Emergency Department (HOSPITAL_COMMUNITY)

## 2024-04-06 ENCOUNTER — Emergency Department (HOSPITAL_COMMUNITY)
Admission: EM | Admit: 2024-04-06 | Discharge: 2024-04-06 | Disposition: A | Discharge status: Home / Self Care | Attending: Emergency Medicine | Admitting: Emergency Medicine

## 2024-04-06 ENCOUNTER — Other Ambulatory Visit: Payer: Self-pay

## 2024-04-06 DIAGNOSIS — Z91040 Latex allergy status: Secondary | ICD-10-CM | POA: Insufficient documentation

## 2024-04-06 DIAGNOSIS — R519 Headache, unspecified: Secondary | ICD-10-CM | POA: Insufficient documentation

## 2024-04-06 DIAGNOSIS — M545 Low back pain, unspecified: Secondary | ICD-10-CM | POA: Diagnosis not present

## 2024-04-06 DIAGNOSIS — S199XXA Unspecified injury of neck, initial encounter: Secondary | ICD-10-CM | POA: Diagnosis not present

## 2024-04-06 DIAGNOSIS — Y92410 Unspecified street and highway as the place of occurrence of the external cause: Secondary | ICD-10-CM | POA: Insufficient documentation

## 2024-04-06 DIAGNOSIS — M47812 Spondylosis without myelopathy or radiculopathy, cervical region: Secondary | ICD-10-CM | POA: Diagnosis not present

## 2024-04-06 DIAGNOSIS — M79641 Pain in right hand: Secondary | ICD-10-CM | POA: Insufficient documentation

## 2024-04-06 DIAGNOSIS — M25511 Pain in right shoulder: Secondary | ICD-10-CM | POA: Diagnosis not present

## 2024-04-06 DIAGNOSIS — M542 Cervicalgia: Secondary | ICD-10-CM | POA: Insufficient documentation

## 2024-04-06 DIAGNOSIS — Z7982 Long term (current) use of aspirin: Secondary | ICD-10-CM | POA: Insufficient documentation

## 2024-04-06 DIAGNOSIS — Z041 Encounter for examination and observation following transport accident: Secondary | ICD-10-CM | POA: Diagnosis not present

## 2024-04-06 MED ORDER — HYDROCODONE-ACETAMINOPHEN 5-325 MG PO TABS
1.0000 | ORAL_TABLET | Freq: Once | ORAL | Status: AC
  Administered 2024-04-06: 1 via ORAL
  Filled 2024-04-06: qty 1

## 2024-04-06 MED ORDER — HYDROCODONE-ACETAMINOPHEN 5-325 MG PO TABS: 1.0000 | ORAL_TABLET | Freq: Four times a day (QID) | ORAL | 0 refills | Status: DC | PRN

## 2024-04-06 NOTE — Discharge Instructions (Signed)
 Return for any problem.  ?
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
## 2024-04-07 ENCOUNTER — Other Ambulatory Visit: Payer: Self-pay

## 2024-04-07 DIAGNOSIS — E785 Hyperlipidemia, unspecified: Secondary | ICD-10-CM

## 2024-04-07 MED ORDER — EZETIMIBE 10 MG PO TABS: 10.0000 mg | ORAL_TABLET | Freq: Every day | ORAL | 0 refills | Status: DC

## 2024-04-07 MED ORDER — ASPIRIN 81 MG PO TBEC: 81.0000 mg | DELAYED_RELEASE_TABLET | Freq: Every day | ORAL | 0 refills | Status: AC

## 2024-04-17 ENCOUNTER — Ambulatory Visit: Admitting: Family Medicine

## 2024-04-17 ENCOUNTER — Encounter: Payer: Self-pay | Admitting: Family Medicine

## 2024-04-17 VITALS — BP 128/62 | HR 98 | Ht 65.0 in | Wt 213.0 lb

## 2024-04-17 DIAGNOSIS — I10 Essential (primary) hypertension: Secondary | ICD-10-CM | POA: Diagnosis not present

## 2024-04-17 DIAGNOSIS — Z111 Encounter for screening for respiratory tuberculosis: Secondary | ICD-10-CM

## 2024-04-17 DIAGNOSIS — Z0000 Encounter for general adult medical examination without abnormal findings: Secondary | ICD-10-CM | POA: Diagnosis not present

## 2024-04-17 DIAGNOSIS — D649 Anemia, unspecified: Secondary | ICD-10-CM

## 2024-04-17 NOTE — Addendum Note (Signed)
 Addended by: GLENDIA DAMIEN SQUIBB on: 04/17/2024 09:51 AM   Modules accepted: Orders

## 2024-04-17 NOTE — Patient Instructions (Addendum)
 It was wonderful to see you today! Thank you for choosing [facility].   Please bring ALL of your medications with you to every visit.   Today we talked about:  We are doing some of your annual blood work today to check on your kidney function and your mild anemia.  You will see those results available on MyChart.  Otherwise you are up-to-date on your blood sugar and cholesterol testing.  Please continue to take your medications as prescribed. You are due for your Pap smear, since you do not want to get it done today I would recommend scheduling back in the next month to have it done to keep you up-to-date on cervical cancer screening.  Please follow up in 6 months    We are checking some labs today. If they are abnormal, I will call you. If they are normal, I will send you a MyChart message (if it is active) or a letter in the mail. If you do not hear about your labs in the next 2 weeks, please call the office.  Call the clinic at [phone] if your symptoms worsen or you have any concerns.  Please be sure to schedule follow up at the front desk before you leave today.   Izetta Nap, DO Family Medicine
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
## 2024-04-17 NOTE — Assessment & Plan Note (Signed)
 At goal on current regimen. -BMP

## 2024-04-18 LAB — CBC
Hematocrit: 36.5 % (ref 34.0–46.6)
Hemoglobin: 11.4 g/dL (ref 11.1–15.9)
MCH: 26 pg — ABNORMAL LOW (ref 26.6–33.0)
MCHC: 31.2 g/dL — ABNORMAL LOW (ref 31.5–35.7)
MCV: 83 fL (ref 79–97)
Platelets: 452 x10E3/uL — ABNORMAL HIGH (ref 150–450)
RBC: 4.39 x10E6/uL (ref 3.77–5.28)
RDW: 13.2 % (ref 11.7–15.4)
WBC: 7.3 x10E3/uL (ref 3.4–10.8)

## 2024-04-18 LAB — BASIC METABOLIC PANEL WITH GFR
BUN/Creatinine Ratio: 21 (ref 9–23)
BUN: 19 mg/dL (ref 6–24)
CO2: 25 mmol/L (ref 20–29)
Calcium: 9.7 mg/dL (ref 8.7–10.2)
Chloride: 100 mmol/L (ref 96–106)
Creatinine, Ser: 0.89 mg/dL (ref 0.57–1.00)
Glucose: 104 mg/dL — ABNORMAL HIGH (ref 70–99)
Potassium: 4.4 mmol/L (ref 3.5–5.2)
Sodium: 139 mmol/L (ref 134–144)
eGFR: 77 mL/min/1.73 (ref >59)

## 2024-04-18 LAB — FERRITIN: Ferritin: 72 ng/mL (ref 15–150)

## 2024-04-20 ENCOUNTER — Ambulatory Visit

## 2024-04-20 DIAGNOSIS — Z111 Encounter for screening for respiratory tuberculosis: Secondary | ICD-10-CM

## 2024-04-20 LAB — TB SKIN TEST
Induration: 0 mm
TB Skin Test: NEGATIVE

## 2024-04-20 NOTE — Progress Notes (Cosign Needed Addendum)
 Patient is here for a PPD read.  It was placed on 04/17/2024 in the left forearm @ 9:45 am/pm.    PPD RESULTS:  Result: negative Induration: 0 mm  Letter created and given to patient for documentation purposes. Chiquita JAYSON English, RN

## 2024-04-21 ENCOUNTER — Ambulatory Visit: Payer: Self-pay | Admitting: Family Medicine

## 2024-04-24 DIAGNOSIS — Z419 Encounter for procedure for purposes other than remedying health state, unspecified: Secondary | ICD-10-CM | POA: Diagnosis not present

## 2024-04-28 ENCOUNTER — Telehealth: Payer: Self-pay | Admitting: Family Medicine

## 2024-04-28 NOTE — Telephone Encounter (Signed)
 Patient dropped off form at front desk for physical fill out form for job.  Verified that patient section of form has been completed.  Last DOS/WCC with PCP was 04/17/2024.  Placed form in red team folder to be completed by clinical staff.  Susan Sanders

## 2024-04-29 ENCOUNTER — Telehealth: Payer: Self-pay | Admitting: Family Medicine

## 2024-04-29 NOTE — Telephone Encounter (Signed)
 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  Verified that patient section of form has been completed.  Last DOS/WCC with PCP was 04/20/2024.  Placed form in RED team folder to be completed by clinical staff.  Susan Sanders

## 2024-04-29 NOTE — Telephone Encounter (Signed)
 Called Susan Sanders after receiving a secure chat that she asked to be called by me. Patient did not answer the phone.

## 2024-05-01 ENCOUNTER — Ambulatory Visit: Admitting: Cardiology

## 2024-05-01 NOTE — Telephone Encounter (Signed)
 Form placed in your box to be completed,please finish when you have time. Thanks!Cassell Mary CMA

## 2024-05-01 NOTE — Progress Notes (Deleted)
 " Cardiology Office Note   Date:  05/01/2024  ID:  Susan Sanders, DOB [birth year], MRN [id number] PCP: Nicholas Bar, MD  [facility] Providers Cardiologist:  None Cardiology APP:  Gerard Frederick, NP { Click to update primary MD,subspecialty MD or APP then REFRESH:1}    History of Present Illness Susan Sanders is a 53 y.o. female with past medical history of CVA, chronic back pain, history of syncope, hyperlipidemia, hypertension, who presents today for follow-up.   In September 2017 she had experienced an episode of presyncope/syncope and underwent neurologic evaluation.  She was not found to have a seizure.  Echocardiogram revealed an EF of 5 to 6% with normal diastolic parameters.  Carotid duplex imaging showed mild carotid plaque bilaterally with dyspnea present.  It was felt most likely her symptoms were possibly due to hypoglycemia or hypotension.  Because of her hypertension history she fell amlodipine  10 mg daily and was taken a prescription for which she started 5 AM to 10 PM.  She had eaten today.  Not a significant event that day.  Later in the night she developed lightheadedness, weakness, sensation of feeling hot, felt dizzy, vomiting only.  Reportedly blood pressure increased to 200s/100.  She ultimately passed out.  She was unaware of any arrhythmia.  Previously she was seen by her PCP and blood pressure continue to be elevated 170/84.  Thought to represent likely that it was a vasovagal episode but there was a question of slurring noted to her QRS complex ICD.  Labs were obtained.  TSH was normal.  She was found to be anemic with a hemoglobin of 10.4 and a deficiency anemia.  She was ultimately started on HCTZ 12.5 mg by her primary care provider.  She was seen in clinic by Dr. Burnard on 07/25/2017 after her syncopal episode.  EKG revealed sinus rhythm with a rate of 94, with mild upsloping of the R wave without delta wave.  She was started on Toprol -XL 25 mg daily for  resting heart rate of 94 bpm.  On examination she was found to have a click and a short systolic murmur and it was recommended that she have an updated echocardiogram completed.  She has been lost to follow-up since that time.  She was hospitalized at [facility] from 5/28 - 10/10/2023. She had presented to the emergency department due to right sided weakness, numbness, and back pain. She was having difficulty speaking and had been unable to pick up things with her right side. Family was concerned at that time she may have had a stroke. In the emergency department blood pressure was noted to be over 200. Chest x-ray showed no acute findings. CTA of the head and neck was normal. CT of the chest abdomen pelvis showed no evidence of dissection. ED provider discussed her case with neurology she was admitted for this along MRI was ordered. 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 Echocardiogram was completed which revealed a normal EF with no intra atrial shunting. She was started on aspirin  81 mg daily and clopidogrel  75 mg daily for 3 weeks to be followed by aspirin  81 mg alone. Her LDL was 123 and she was started on statin therapy. She was to follow-up with neurology as an outpatient. For blood pressure management she was continued on losartan  50 mg daily, amlodipine  10 mg daily, and HCTZ 12.5 mg daily.   She was  evaluated in the [facility] emergency department on 12/23/2023 with complaints of right sided chest discomfort.  She started noticing discomfort in her right shoulder, radiating to her right upper chest and right sided neck with right arm heaviness.  She denied any associated numbness.  She also noticed tender swelling under her armpit.  Pertinent labs revealed potassium 3.4, blood glucose 118, hemoglobin 11.0, platelets 425, serum creatinine 1.10, blood glucose of 114.  She was treated  with potassium, Toradol , and Robaxin .  With a recent CVA in May with right-sided deficits she was currently continued on DAPT.  Lab was unrevealing.  She underwent right upper extremity DVT study and CTA of the chest.  Results were negative for any acute findings.  She was recommended to follow-up with cardiology and was able to be discharged home.  She was last seen in clinic 01/24/2019 fasting that she been doing okay.  She continued to have right arm and chest pain and feels like a pulling and tightening sensation.  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  She was scheduled for coronary CTA to rule out any ischemic causes.    She returns to clinic today    ROS: 10 point review of system has been reviewed and considered negative with exception with 1 listed in the HPI  Studies Reviewed     cCTA 02/06/2024 IMPRESSION: 1. Coronary calcium  score of 24.8. This was 15 percentile for age and sex matched control.   2. Normal coronary origin with right dominance.   3. CAD-RADS 1. Minimal non-obstructive CAD (0-24%). Consider non-atherosclerotic causes of chest pain. Consider preventive therapy and risk factor modification.   2d echo 10/10/2023 1. Left ventricular ejection fraction, by estimation, is 60 to 65%. The  left ventricle has normal function. The left ventricle has no regional  wall motion abnormalities. There is mild concentric left ventricular  hypertrophy. Left ventricular diastolic  parameters were normal.   2. Right ventricular systolic function is normal. The right ventricular  size is normal.   3. The mitral valve is normal in structure. Trivial mitral valve  regurgitation. No evidence of mitral stenosis. The mean mitral valve  gradient is 1.0 mmHg.   4. The aortic valve is normal in structure. Aortic valve regurgitation is  not visualized. No aortic stenosis is present. Aortic valve Vmax measures  1.32 m/s.   5. The inferior vena cava is  normal in size with greater than 50%  respiratory variability, suggesting right atrial pressure of 3 mmHg.    2d echo 08/09/2017 Study Conclusions  - Left ventricle: The cavity size was normal. There was mild    concentric hypertrophy. Systolic function was normal. The    estimated ejection fraction was in the range of 60% to 65%. Wall    motion was normal; there were no regional wall motion    abnormalities. Left ventricular diastolic function parameters    were normal.  - Aortic valve: Trileaflet; mildly thickened, mildly calcified    leaflets.  - Right ventricle: The cavity size was at the upper limits of    normal. Wall thickness was normal.  - Tricuspid valve: There was trivial regurgitation.  - Pulmonic valve: There was trivial regurgitation.   Risk Assessment/Calculations   No BP recorded.  {Refresh Note OR Click here to enter BP  :1}***       Physical Exam VS:  LMP 06/24/2018        Wt Readings from  Last 3 Encounters:  04/17/24 213 lb (96.6 kg)  01/30/24 210 lb 6.4 oz (95.4 kg)  01/24/24 211 lb 12.8 oz (96.1 kg)    GEN: Well nourished, well developed in no acute distress NECK: No JVD; No carotid bruits CARDIAC: ***RRR, no murmurs, rubs, gallops RESPIRATORY:  Clear to auscultation without rales, wheezing or rhonchi  ABDOMEN: Soft, non-tender, non-distended EXTREMITIES:  No edema; No deformity   ASSESSMENT AND PLAN Atypical chest pain CVA in May 2024 Hypertension Hyperlipidemia    {Are you ordering a CV Procedure (e.g. stress test, cath, DCCV, TEE, etc)?   Press F2        :[id number]}  Dispo: ***  Signed, Limuel Nieblas, NP   "

## 2024-05-04 ENCOUNTER — Telehealth: Payer: Self-pay | Admitting: Family Medicine

## 2024-05-04 ENCOUNTER — Other Ambulatory Visit: Payer: Self-pay

## 2024-05-04 DIAGNOSIS — E785 Hyperlipidemia, unspecified: Secondary | ICD-10-CM

## 2024-05-04 MED ORDER — EZETIMIBE 10 MG PO TABS: 10.0000 mg | ORAL_TABLET | Freq: Every day | ORAL | 0 refills | Status: AC

## 2024-05-04 NOTE — Telephone Encounter (Signed)
 Patient came in upset stating she wants her papers and received no phone, I did inform the patient Dr.Boyina did try to give her a call and she stated she never got one. She asked to give her a call but be aware because she did state she was gonna put a bug in her ear out of anger.   Thank you.  Patients number is [phone]

## 2024-05-05 NOTE — Telephone Encounter (Signed)
 Employment form given to patient.   Dental clearance faxed to [facility].   Copy made for batch scanning.

## 2024-05-08 ENCOUNTER — Ambulatory Visit
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
## 2024-05-21 ENCOUNTER — Ambulatory Visit

## 2024-05-22 ENCOUNTER — Ambulatory Visit: Attending: Cardiology | Admitting: Cardiology

## 2024-05-22 ENCOUNTER — Encounter: Payer: Self-pay | Admitting: Cardiology

## 2024-05-22 VITALS — BP 140/74 | HR 74 | Ht 65.0 in | Wt 219.2 lb

## 2024-05-22 DIAGNOSIS — I2510 Atherosclerotic heart disease of native coronary artery without angina pectoris: Secondary | ICD-10-CM | POA: Diagnosis present

## 2024-05-22 DIAGNOSIS — E782 Mixed hyperlipidemia: Secondary | ICD-10-CM | POA: Insufficient documentation

## 2024-05-22 DIAGNOSIS — I6300 Cerebral infarction due to thrombosis of unspecified precerebral artery: Secondary | ICD-10-CM | POA: Insufficient documentation

## 2024-05-22 DIAGNOSIS — I10 Essential (primary) hypertension: Secondary | ICD-10-CM | POA: Insufficient documentation

## 2024-05-22 NOTE — Patient Instructions (Signed)
 Medication Instructions:  Your physician recommends that you continue on your current medications as directed. Please refer to the Current Medication list given to you today.   *If you need a refill on your cardiac medications before your next appointment, please call your pharmacy*  Lab Work: No labs ordered today  If you have labs (blood work) drawn today and your tests are completely normal, you will receive your results only by: MyChart Message (if you have MyChart) OR A paper copy in the mail If you have any lab test that is abnormal or we need to change your treatment, we will call you to review the results.  Testing/Procedures: No test ordered today   Follow-Up: At [facility], you and your health needs are our priority.  As part of our continuing mission to provide you with exceptional heart care, our providers are all part of one team.  This team includes your primary Cardiologist (physician) and Advanced Practice Providers or APPs (Physician Assistants and Nurse Practitioners) who all work together to provide you with the care you need, when you need it.  Your next appointment:   6 month(s)  Provider:   Ronald Cockayne, NP

## 2024-05-26 ENCOUNTER — Ambulatory Visit: Admitting: Family Medicine

## 2024-06-23 ENCOUNTER — Ambulatory Visit: Admitting: Adult Health

## 2024-07-14 ENCOUNTER — Ambulatory Visit: Admitting: Adult Health
